# Patient Record
Sex: Male | Born: 1940
Health system: Southern US, Community
[De-identification: ages and names within clinical notes are randomized; demographics above are authoritative.]

## PROBLEM LIST (undated history)

## (undated) DIAGNOSIS — R748 Abnormal levels of other serum enzymes: Secondary | ICD-10-CM

## (undated) DIAGNOSIS — Z720 Tobacco use: Secondary | ICD-10-CM

## (undated) DIAGNOSIS — I10 Essential (primary) hypertension: Secondary | ICD-10-CM

## (undated) DIAGNOSIS — T7840XA Allergy, unspecified, initial encounter: Secondary | ICD-10-CM

## (undated) DIAGNOSIS — E785 Hyperlipidemia, unspecified: Secondary | ICD-10-CM

## (undated) DIAGNOSIS — E119 Type 2 diabetes mellitus without complications: Secondary | ICD-10-CM

## (undated) HISTORY — PX: FOOT SURGERY: SHX648

## (undated) HISTORY — DX: Type 2 diabetes mellitus without complications: E11.9

## (undated) HISTORY — DX: Allergy, unspecified, initial encounter: T78.40XA

## (undated) HISTORY — DX: Tobacco use: Z72.0

## (undated) HISTORY — DX: Hyperlipidemia, unspecified: E78.5

## (undated) HISTORY — DX: Essential (primary) hypertension: I10

---
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
## 2012-01-18 ENCOUNTER — Emergency Department: Payer: Self-pay | Admitting: Emergency Medicine

## 2012-01-18 ENCOUNTER — Ambulatory Visit: Payer: Self-pay | Admitting: Internal Medicine

## 2012-01-18 DIAGNOSIS — R5383 Other fatigue: Secondary | ICD-10-CM | POA: Diagnosis not present

## 2012-01-18 DIAGNOSIS — Z79899 Other long term (current) drug therapy: Secondary | ICD-10-CM | POA: Diagnosis not present

## 2012-01-18 DIAGNOSIS — F17200 Nicotine dependence, unspecified, uncomplicated: Secondary | ICD-10-CM | POA: Diagnosis not present

## 2012-01-18 DIAGNOSIS — R6889 Other general symptoms and signs: Secondary | ICD-10-CM | POA: Diagnosis not present

## 2012-01-18 DIAGNOSIS — I10 Essential (primary) hypertension: Secondary | ICD-10-CM | POA: Diagnosis not present

## 2012-01-18 DIAGNOSIS — R42 Dizziness and giddiness: Secondary | ICD-10-CM | POA: Diagnosis not present

## 2012-01-18 DIAGNOSIS — I6789 Other cerebrovascular disease: Secondary | ICD-10-CM | POA: Diagnosis not present

## 2012-01-18 DIAGNOSIS — R51 Headache: Secondary | ICD-10-CM | POA: Diagnosis not present

## 2012-01-18 DIAGNOSIS — R279 Unspecified lack of coordination: Secondary | ICD-10-CM | POA: Diagnosis not present

## 2012-01-18 DIAGNOSIS — E119 Type 2 diabetes mellitus without complications: Secondary | ICD-10-CM | POA: Diagnosis not present

## 2012-01-18 DIAGNOSIS — I6350 Cerebral infarction due to unspecified occlusion or stenosis of unspecified cerebral artery: Secondary | ICD-10-CM | POA: Diagnosis not present

## 2012-01-18 DIAGNOSIS — R5381 Other malaise: Secondary | ICD-10-CM | POA: Diagnosis not present

## 2012-01-18 LAB — COMPREHENSIVE METABOLIC PANEL
Albumin: 3.9 g/dL (ref 3.4–5.0)
Anion Gap: 10 (ref 7–16)
BUN: 12 mg/dL (ref 7–18)
Bilirubin,Total: 0.5 mg/dL (ref 0.2–1.0)
Chloride: 98 mmol/L (ref 98–107)
Co2: 26 mmol/L (ref 21–32)
Creatinine: 0.81 mg/dL (ref 0.60–1.30)
EGFR (African American): >60
EGFR (Non-African Amer.): >60
Osmolality: 277 (ref 275–301)
Potassium: 3.7 mmol/L (ref 3.5–5.1)
SGOT(AST): 14 U/L — ABNORMAL LOW (ref 15–37)
Sodium: 134 mmol/L — ABNORMAL LOW (ref 136–145)
Total Protein: 7.2 g/dL (ref 6.4–8.2)

## 2012-01-18 LAB — CBC
HGB: 15.2 g/dL (ref 13.0–18.0)
MCH: 31.7 pg (ref 26.0–34.0)
MCV: 90 fL (ref 80–100)
Platelet: 210 10*3/uL (ref 150–440)
WBC: 9 10*3/uL (ref 3.8–10.6)

## 2012-01-18 LAB — TROPONIN I: Troponin-I: <0.02 ng/mL

## 2012-01-21 DIAGNOSIS — I10 Essential (primary) hypertension: Secondary | ICD-10-CM | POA: Diagnosis not present

## 2012-01-21 DIAGNOSIS — I6789 Other cerebrovascular disease: Secondary | ICD-10-CM | POA: Diagnosis not present

## 2012-01-21 DIAGNOSIS — IMO0001 Reserved for inherently not codable concepts without codable children: Secondary | ICD-10-CM | POA: Diagnosis not present

## 2012-01-21 DIAGNOSIS — J449 Chronic obstructive pulmonary disease, unspecified: Secondary | ICD-10-CM | POA: Diagnosis not present

## 2012-02-09 DIAGNOSIS — E119 Type 2 diabetes mellitus without complications: Secondary | ICD-10-CM | POA: Diagnosis not present

## 2012-07-26 DIAGNOSIS — E785 Hyperlipidemia, unspecified: Secondary | ICD-10-CM | POA: Diagnosis not present

## 2012-07-26 DIAGNOSIS — E119 Type 2 diabetes mellitus without complications: Secondary | ICD-10-CM | POA: Diagnosis not present

## 2012-07-26 DIAGNOSIS — I10 Essential (primary) hypertension: Secondary | ICD-10-CM | POA: Diagnosis not present

## 2012-07-26 DIAGNOSIS — F17200 Nicotine dependence, unspecified, uncomplicated: Secondary | ICD-10-CM | POA: Diagnosis not present

## 2013-04-02 DIAGNOSIS — E785 Hyperlipidemia, unspecified: Secondary | ICD-10-CM | POA: Diagnosis not present

## 2013-04-02 DIAGNOSIS — I10 Essential (primary) hypertension: Secondary | ICD-10-CM | POA: Diagnosis not present

## 2013-04-02 DIAGNOSIS — E119 Type 2 diabetes mellitus without complications: Secondary | ICD-10-CM | POA: Diagnosis not present

## 2013-09-17 DIAGNOSIS — E785 Hyperlipidemia, unspecified: Secondary | ICD-10-CM | POA: Diagnosis not present

## 2013-09-17 DIAGNOSIS — IMO0001 Reserved for inherently not codable concepts without codable children: Secondary | ICD-10-CM | POA: Diagnosis not present

## 2013-09-17 DIAGNOSIS — Z125 Encounter for screening for malignant neoplasm of prostate: Secondary | ICD-10-CM | POA: Diagnosis not present

## 2013-09-17 DIAGNOSIS — I10 Essential (primary) hypertension: Secondary | ICD-10-CM | POA: Diagnosis not present

## 2014-03-28 DIAGNOSIS — F17200 Nicotine dependence, unspecified, uncomplicated: Secondary | ICD-10-CM | POA: Diagnosis not present

## 2014-03-28 DIAGNOSIS — I10 Essential (primary) hypertension: Secondary | ICD-10-CM | POA: Diagnosis not present

## 2014-03-28 DIAGNOSIS — J449 Chronic obstructive pulmonary disease, unspecified: Secondary | ICD-10-CM | POA: Diagnosis not present

## 2014-03-28 DIAGNOSIS — E785 Hyperlipidemia, unspecified: Secondary | ICD-10-CM | POA: Diagnosis not present

## 2014-03-28 DIAGNOSIS — IMO0001 Reserved for inherently not codable concepts without codable children: Secondary | ICD-10-CM | POA: Diagnosis not present

## 2014-10-03 DIAGNOSIS — Z125 Encounter for screening for malignant neoplasm of prostate: Secondary | ICD-10-CM | POA: Diagnosis not present

## 2014-10-03 DIAGNOSIS — I10 Essential (primary) hypertension: Secondary | ICD-10-CM | POA: Diagnosis not present

## 2014-10-03 DIAGNOSIS — E785 Hyperlipidemia, unspecified: Secondary | ICD-10-CM | POA: Diagnosis not present

## 2014-10-11 ENCOUNTER — Ambulatory Visit: Payer: Self-pay | Admitting: Family Medicine

## 2014-10-11 DIAGNOSIS — M4184 Other forms of scoliosis, thoracic region: Secondary | ICD-10-CM | POA: Diagnosis not present

## 2014-10-11 DIAGNOSIS — M542 Cervicalgia: Secondary | ICD-10-CM | POA: Diagnosis not present

## 2014-10-11 DIAGNOSIS — M546 Pain in thoracic spine: Secondary | ICD-10-CM | POA: Diagnosis not present

## 2014-10-11 DIAGNOSIS — M47814 Spondylosis without myelopathy or radiculopathy, thoracic region: Secondary | ICD-10-CM | POA: Diagnosis not present

## 2014-10-11 DIAGNOSIS — M47812 Spondylosis without myelopathy or radiculopathy, cervical region: Secondary | ICD-10-CM | POA: Diagnosis not present

## 2015-01-28 ENCOUNTER — Telehealth: Payer: Self-pay | Admitting: Family Medicine

## 2015-01-28 NOTE — Telephone Encounter (Signed)
Please send Amloidpine 5mg  30tabs once daily and metformin 500mg  30 once daily. Please send to [email or web address]

## 2015-02-03 MED ORDER — METFORMIN HCL 500 MG PO TABS: 500.0000 mg | ORAL_TABLET | Freq: Two times a day (BID) | ORAL | Status: DC

## 2015-02-03 MED ORDER — AMLODIPINE BESYLATE 5 MG PO TABS: 5.0000 mg | ORAL_TABLET | Freq: Every day | ORAL | Status: DC

## 2015-02-04 ENCOUNTER — Ambulatory Visit: Payer: Self-pay | Admitting: Family Medicine

## 2015-02-26 ENCOUNTER — Ambulatory Visit: Payer: Self-pay | Admitting: Family Medicine

## 2015-02-28 ENCOUNTER — Other Ambulatory Visit: Payer: Self-pay | Admitting: Family Medicine

## 2015-03-03 ENCOUNTER — Other Ambulatory Visit: Payer: Self-pay

## 2015-03-03 MED ORDER — AMLODIPINE BESYLATE 5 MG PO TABS: 5.0000 mg | ORAL_TABLET | Freq: Every day | ORAL | Status: DC

## 2015-03-03 MED ORDER — METFORMIN HCL 500 MG PO TABS: 500.0000 mg | ORAL_TABLET | Freq: Two times a day (BID) | ORAL | Status: DC

## 2015-03-05 ENCOUNTER — Ambulatory Visit: Payer: Self-pay | Admitting: Family Medicine

## 2015-03-13 ENCOUNTER — Ambulatory Visit: Payer: Self-pay | Admitting: Family Medicine

## 2015-03-18 ENCOUNTER — Ambulatory Visit: Payer: Self-pay | Admitting: Family Medicine

## 2015-03-25 ENCOUNTER — Ambulatory Visit: Payer: Self-pay | Admitting: Family Medicine

## 2015-03-26 ENCOUNTER — Other Ambulatory Visit: Payer: Self-pay | Admitting: Family Medicine

## 2015-03-31 ENCOUNTER — Ambulatory Visit: Payer: Self-pay | Admitting: Family Medicine

## 2015-04-02 ENCOUNTER — Encounter: Payer: Self-pay | Admitting: Family Medicine

## 2015-04-02 ENCOUNTER — Ambulatory Visit (INDEPENDENT_AMBULATORY_CARE_PROVIDER_SITE_OTHER): Payer: Medicare Other | Admitting: Family Medicine

## 2015-04-02 VITALS — BP 120/62 | HR 70 | Temp 98.1°F | Resp 16 | Ht 66.0 in | Wt 178.4 lb

## 2015-04-02 DIAGNOSIS — E785 Hyperlipidemia, unspecified: Secondary | ICD-10-CM | POA: Diagnosis not present

## 2015-04-02 DIAGNOSIS — J410 Simple chronic bronchitis: Secondary | ICD-10-CM | POA: Diagnosis not present

## 2015-04-02 DIAGNOSIS — J431 Panlobular emphysema: Secondary | ICD-10-CM | POA: Insufficient documentation

## 2015-04-02 DIAGNOSIS — Z720 Tobacco use: Secondary | ICD-10-CM

## 2015-04-02 DIAGNOSIS — I10 Essential (primary) hypertension: Secondary | ICD-10-CM | POA: Diagnosis not present

## 2015-04-02 DIAGNOSIS — E1165 Type 2 diabetes mellitus with hyperglycemia: Secondary | ICD-10-CM | POA: Diagnosis not present

## 2015-04-02 DIAGNOSIS — IMO0002 Reserved for concepts with insufficient information to code with codable children: Secondary | ICD-10-CM

## 2015-04-02 LAB — GLUCOSE, POCT (MANUAL RESULT ENTRY): POC GLUCOSE: 227 mg/dL — AB (ref 70–99)

## 2015-04-02 LAB — POCT GLYCOSYLATED HEMOGLOBIN (HGB A1C): HEMOGLOBIN A1C: 8.3

## 2015-04-02 NOTE — Progress Notes (Addendum)
Name: Connor Aguilar   MRN: [id number]    DOB: 05-05-1941   Date:04/02/2015       Progress Note  Subjective  Chief Complaint  Chief Complaint  Patient presents with  . Hypertension  . Hyperlipidemia  . Diabetes    HPI  Diabetes.  Patient is prescribed metformin 500 mg twice a day. No polyuria polydipsia polyphagia currently. He's had diabetes for over 5 years been having a chronic problem with noncompliance with diet and exercise and medication intermittently. He is not on the dietitian as prescribed and start regular checking his glucoses at home.  Hypertension  Patient currently on Avalide 150-2.5 once daily and amlodipine 5 mg daily for chest pain palpitations orthopnea PND or lower extremity swelling.  Hyperlipidemia patient currently on a regimen of atorvastatin 40 mg at bedtime and aspirin once daily. His risk factors include tobacco abuse hyperlipidemia diabetes hypertension sedentary lifestyle and stress.  COPD  Patient continues to smoke one or more packs per day and it done so since his teens. He has nearly daily cough but no significant wheezing or shortness of breath. He has refused pneumococcal vaccine as well as flu vaccine. He is not interested in discontinuation of smoking at all.  Past Medical History  Diagnosis Date  . Diabetes mellitus without complication   . Hypertension   . Hyperlipidemia   . Allergy   . Tobacco abuse     Social History  Substance Use Topics  . Smoking status: Current Every Day Smoker  . Smokeless tobacco: Not on file  . Alcohol Use: No     Current outpatient prescriptions:  .  amLODipine (NORVASC) 5 MG tablet, Take 1 tablet (5 mg total) by mouth daily., Disp: 90 tablet, Rfl: 3 .  cyclobenzaprine (FLEXERIL) 5 MG tablet, TAKE ONE (1) TABLET BY MOUTH TWO (2) TIMES DAILY AS NEEDED FOR MUSCLESPASM, Disp: 60 tablet, Rfl: 1 .  metFORMIN (GLUCOPHAGE) 500 MG tablet, Take 1 tablet (500 mg total) by mouth 2 (two) times daily with a meal.,  Disp: 180 tablet, Rfl: 3  No Known Allergies  Review of Systems  Constitutional: Negative for fever, chills and weight loss.  HENT: Negative for congestion, hearing loss, sore throat and tinnitus.   Eyes: Negative for blurred vision, double vision and redness.  Respiratory: Positive for cough. Negative for hemoptysis and shortness of breath.   Cardiovascular: Negative for chest pain, palpitations, orthopnea, claudication and leg swelling.  Gastrointestinal: Negative for heartburn, nausea, vomiting, diarrhea, constipation and blood in stool.  Genitourinary: Negative for dysuria, urgency, frequency and hematuria.  Musculoskeletal: Positive for back pain and joint pain. Negative for myalgias, falls and neck pain.  Skin: Negative for itching.  Neurological: Negative for dizziness, tingling, tremors, focal weakness, seizures, loss of consciousness, weakness and headaches.  Endo/Heme/Allergies: Does not bruise/bleed easily.  Psychiatric/Behavioral: Negative for depression and substance abuse. The patient is not nervous/anxious and does not have insomnia.      Objective  Filed Vitals:   04/02/15 0955  BP: 120/62  Pulse: 70  Temp: 98.1 F (36.7 C)  Resp: 16  Height: 5\' 6"  (1.676 m)  Weight: 178 lb 7 oz (80.939 kg)  SpO2: 95%     Physical Exam  Constitutional: He is oriented to person, place, and time and well-developed, well-nourished, and in no distress.  HENT:  Head: Normocephalic.  Eyes: EOM are normal. Pupils are equal, round, and reactive to light.  Neck: Normal range of motion. Neck supple. No thyromegaly present.  Cardiovascular: Normal  rate, regular rhythm, normal heart sounds and intact distal pulses.   No murmur heard. Pulmonary/Chest: Effort normal. No respiratory distress. He has no wheezes.  Diminished breath sounds bilaterally with hyperresonance to percussion  Abdominal: Soft. Bowel sounds are normal.  Musculoskeletal: Normal range of motion. He exhibits no edema.   Lymphadenopathy:    He has no cervical adenopathy.  Neurological: He is alert and oriented to person, place, and time. No cranial nerve deficit. Gait normal. Coordination normal.  Skin: Skin is warm and dry. No rash noted.  Psychiatric: Affect and judgment normal.      Assessment & Plan   1. Uncontrolled diabetes mellitus sTILL UNCONTROLLED - POCT HgB A1C - POCT Glucose (CBG) - TSH - Ambulatory referral to Ophthalmology  2. Essential hypertension STABLE - TSH  3. Hyperlipidemia  NEEDS LABS - Comprehensive metabolic panel - Lipid panel - TSH  4. Simple chronic bronchitis UNCHANGED - Spirometry: Pre & Post Eval  5. Tobacco abuse NOT WILLING TO STAY.

## 2015-04-03 ENCOUNTER — Encounter: Payer: Self-pay | Admitting: Family Medicine

## 2015-04-03 DIAGNOSIS — J410 Simple chronic bronchitis: Secondary | ICD-10-CM | POA: Insufficient documentation

## 2015-04-09 DIAGNOSIS — Z720 Tobacco use: Secondary | ICD-10-CM | POA: Insufficient documentation

## 2015-04-30 ENCOUNTER — Other Ambulatory Visit: Payer: Self-pay | Admitting: Family Medicine

## 2015-05-29 ENCOUNTER — Other Ambulatory Visit: Payer: Self-pay | Admitting: Family Medicine

## 2015-07-02 ENCOUNTER — Other Ambulatory Visit: Payer: Self-pay | Admitting: Family Medicine

## 2015-07-27 ENCOUNTER — Emergency Department: Payer: Medicare Other

## 2015-07-27 ENCOUNTER — Encounter: Payer: Self-pay | Admitting: Emergency Medicine

## 2015-07-27 ENCOUNTER — Inpatient Hospital Stay
Admission: EM | Admit: 2015-07-27 | Discharge: 2015-07-30 | DRG: 871 | Disposition: A | Discharge status: Home / Self Care | Payer: Medicare Other | Attending: Internal Medicine | Admitting: Internal Medicine

## 2015-07-27 DIAGNOSIS — Z7984 Long term (current) use of oral hypoglycemic drugs: Secondary | ICD-10-CM

## 2015-07-27 DIAGNOSIS — E119 Type 2 diabetes mellitus without complications: Secondary | ICD-10-CM | POA: Diagnosis present

## 2015-07-27 DIAGNOSIS — J181 Lobar pneumonia, unspecified organism: Secondary | ICD-10-CM | POA: Diagnosis not present

## 2015-07-27 DIAGNOSIS — Z23 Encounter for immunization: Secondary | ICD-10-CM | POA: Diagnosis not present

## 2015-07-27 DIAGNOSIS — Z9181 History of falling: Secondary | ICD-10-CM | POA: Diagnosis not present

## 2015-07-27 DIAGNOSIS — E785 Hyperlipidemia, unspecified: Secondary | ICD-10-CM | POA: Diagnosis present

## 2015-07-27 DIAGNOSIS — M6282 Rhabdomyolysis: Secondary | ICD-10-CM | POA: Diagnosis not present

## 2015-07-27 DIAGNOSIS — I10 Essential (primary) hypertension: Secondary | ICD-10-CM | POA: Diagnosis present

## 2015-07-27 DIAGNOSIS — Z79899 Other long term (current) drug therapy: Secondary | ICD-10-CM | POA: Diagnosis not present

## 2015-07-27 DIAGNOSIS — E876 Hypokalemia: Secondary | ICD-10-CM | POA: Diagnosis present

## 2015-07-27 DIAGNOSIS — J189 Pneumonia, unspecified organism: Secondary | ICD-10-CM | POA: Diagnosis not present

## 2015-07-27 DIAGNOSIS — A419 Sepsis, unspecified organism: Principal | ICD-10-CM | POA: Diagnosis present

## 2015-07-27 DIAGNOSIS — J690 Pneumonitis due to inhalation of food and vomit: Secondary | ICD-10-CM | POA: Diagnosis present

## 2015-07-27 DIAGNOSIS — S0990XA Unspecified injury of head, initial encounter: Secondary | ICD-10-CM | POA: Diagnosis not present

## 2015-07-27 DIAGNOSIS — R413 Other amnesia: Secondary | ICD-10-CM | POA: Diagnosis present

## 2015-07-27 DIAGNOSIS — F17210 Nicotine dependence, cigarettes, uncomplicated: Secondary | ICD-10-CM | POA: Diagnosis not present

## 2015-07-27 DIAGNOSIS — S299XXA Unspecified injury of thorax, initial encounter: Secondary | ICD-10-CM | POA: Diagnosis not present

## 2015-07-27 LAB — URINALYSIS COMPLETE WITH MICROSCOPIC (ARMC ONLY)
BACTERIA UA: NONE SEEN
Bilirubin Urine: NEGATIVE
Glucose, UA: 50 mg/dL — AB
LEUKOCYTES UA: NEGATIVE
NITRITE: NEGATIVE
PH: 5 (ref 5.0–8.0)
PROTEIN: 100 mg/dL — AB
RBC / HPF: NONE SEEN RBC/hpf (ref 0–5)
SPECIFIC GRAVITY, URINE: 1.021 (ref 1.005–1.030)

## 2015-07-27 LAB — CBC WITH DIFFERENTIAL/PLATELET
BASOS PCT: 0 %
Basophils Absolute: 0 10*3/uL (ref 0–0.1)
EOS ABS: 0 10*3/uL (ref 0–0.7)
Eosinophils Relative: 0 %
HCT: 40.8 % (ref 40.0–52.0)
HEMOGLOBIN: 13.6 g/dL (ref 13.0–18.0)
Lymphocytes Relative: 3 %
Lymphs Abs: 0.8 10*3/uL — ABNORMAL LOW (ref 1.0–3.6)
MCH: 30.3 pg (ref 26.0–34.0)
MCHC: 33.4 g/dL (ref 32.0–36.0)
MCV: 90.7 fL (ref 80.0–100.0)
MONO ABS: 1.3 10*3/uL — AB (ref 0.2–1.0)
MONOS PCT: 5 %
NEUTROS PCT: 92 %
Neutro Abs: 26.6 10*3/uL — ABNORMAL HIGH (ref 1.4–6.5)
Platelets: 236 10*3/uL (ref 150–440)
RBC: 4.49 MIL/uL (ref 4.40–5.90)
RDW: 13.5 % (ref 11.5–14.5)
WBC: 28.7 10*3/uL — ABNORMAL HIGH (ref 3.8–10.6)

## 2015-07-27 LAB — COMPREHENSIVE METABOLIC PANEL
ALK PHOS: 46 U/L (ref 38–126)
ALT: 24 U/L (ref 17–63)
AST: 38 U/L (ref 15–41)
Albumin: 3.8 g/dL (ref 3.5–5.0)
Anion gap: 17 — ABNORMAL HIGH (ref 5–15)
BILIRUBIN TOTAL: 1.4 mg/dL — AB (ref 0.3–1.2)
BUN: 21 mg/dL — ABNORMAL HIGH (ref 6–20)
CALCIUM: 8.8 mg/dL — AB (ref 8.9–10.3)
CO2: 22 mmol/L (ref 22–32)
CREATININE: 1.21 mg/dL (ref 0.61–1.24)
Chloride: 95 mmol/L — ABNORMAL LOW (ref 101–111)
GFR calc non Af Amer: 56 mL/min — ABNORMAL LOW (ref >60)
Glucose, Bld: 199 mg/dL — ABNORMAL HIGH (ref 65–99)
Potassium: 3.1 mmol/L — ABNORMAL LOW (ref 3.5–5.1)
SODIUM: 134 mmol/L — AB (ref 135–145)
TOTAL PROTEIN: 7.2 g/dL (ref 6.5–8.1)

## 2015-07-27 LAB — TROPONIN I
TROPONIN I: 0.07 ng/mL — AB (ref <0.031)
TROPONIN I: 0.07 ng/mL — AB (ref <0.031)
Troponin I: 0.07 ng/mL — ABNORMAL HIGH (ref <0.031)

## 2015-07-27 LAB — PROTIME-INR
INR: 1.32
Prothrombin Time: 16.5 seconds — ABNORMAL HIGH (ref 11.4–15.0)

## 2015-07-27 LAB — LACTIC ACID, PLASMA
LACTIC ACID, VENOUS: 3.8 mmol/L — AB (ref 0.5–2.0)
LACTIC ACID, VENOUS: 2.3 mmol/L — AB (ref 0.5–2.0)

## 2015-07-27 LAB — CK: Total CK: 2106 U/L — ABNORMAL HIGH (ref 49–397)

## 2015-07-27 LAB — LIPASE, BLOOD: Lipase: 13 U/L (ref 11–51)

## 2015-07-27 LAB — APTT: aPTT: 29 seconds (ref 24–36)

## 2015-07-27 MED ORDER — SODIUM CHLORIDE 0.9 % IJ SOLN
3.0000 mL | Freq: Two times a day (BID) | INTRAMUSCULAR | Status: DC
  Administered 2015-07-27 – 2015-07-30 (×4): 3 mL via INTRAVENOUS

## 2015-07-27 MED ORDER — CYCLOBENZAPRINE HCL 10 MG PO TABS: 10.0000 mg | ORAL_TABLET | Freq: Three times a day (TID) | ORAL | Status: DC | PRN

## 2015-07-27 MED ORDER — PIPERACILLIN-TAZOBACTAM 3.375 G IVPB 30 MIN
3.3750 g | Freq: Once | INTRAVENOUS | Status: AC
  Administered 2015-07-27: 3.375 g via INTRAVENOUS
  Filled 2015-07-27: qty 50

## 2015-07-27 MED ORDER — ONDANSETRON HCL 4 MG/2ML IJ SOLN: 4.0000 mg | Freq: Four times a day (QID) | INTRAMUSCULAR | Status: DC | PRN

## 2015-07-27 MED ORDER — VANCOMYCIN HCL IN DEXTROSE 1-5 GM/200ML-% IV SOLN
1000.0000 mg | Freq: Once | INTRAVENOUS | Status: AC
  Administered 2015-07-27: 1000 mg via INTRAVENOUS
  Filled 2015-07-27: qty 200

## 2015-07-27 MED ORDER — DEXTROSE 5 % IV SOLN
500.0000 mg | INTRAVENOUS | Status: DC
  Filled 2015-07-27: qty 500

## 2015-07-27 MED ORDER — SODIUM CHLORIDE 0.9 % IV BOLUS (SEPSIS)
2310.0000 mL | Freq: Once | INTRAVENOUS | Status: AC
  Administered 2015-07-27: 2310 mL via INTRAVENOUS

## 2015-07-27 MED ORDER — ENOXAPARIN SODIUM 40 MG/0.4ML ~~LOC~~ SOLN
40.0000 mg | SUBCUTANEOUS | Status: DC
  Administered 2015-07-27 – 2015-07-29 (×2): 40 mg via SUBCUTANEOUS
  Filled 2015-07-27 (×2): qty 0.4

## 2015-07-27 MED ORDER — VANCOMYCIN HCL IN DEXTROSE 1-5 GM/200ML-% IV SOLN: 1000.0000 mg | Freq: Once | INTRAVENOUS | Status: DC

## 2015-07-27 MED ORDER — PIPERACILLIN-TAZOBACTAM 3.375 G IVPB: 3.3750 g | Freq: Three times a day (TID) | INTRAVENOUS | Status: DC

## 2015-07-27 MED ORDER — ACETAMINOPHEN 650 MG RE SUPP: 650.0000 mg | Freq: Four times a day (QID) | RECTAL | Status: DC | PRN

## 2015-07-27 MED ORDER — ONDANSETRON HCL 4 MG PO TABS: 4.0000 mg | ORAL_TABLET | Freq: Four times a day (QID) | ORAL | Status: DC | PRN

## 2015-07-27 MED ORDER — TETANUS-DIPHTH-ACELL PERTUSSIS 5-2.5-18.5 LF-MCG/0.5 IM SUSP
0.5000 mL | Freq: Once | INTRAMUSCULAR | Status: AC
  Administered 2015-07-27: 0.5 mL via INTRAMUSCULAR
  Filled 2015-07-27: qty 0.5

## 2015-07-27 MED ORDER — GUAIFENESIN-DM 100-10 MG/5ML PO SYRP
5.0000 mL | ORAL_SOLUTION | ORAL | Status: DC | PRN
  Administered 2015-07-29: 5 mL via ORAL
  Filled 2015-07-27: qty 5

## 2015-07-27 MED ORDER — POTASSIUM CHLORIDE IN NACL 40-0.9 MEQ/L-% IV SOLN
INTRAVENOUS | Status: DC
  Administered 2015-07-27 – 2015-07-28 (×3): 100 mL/h via INTRAVENOUS
  Administered 2015-07-28 – 2015-07-29 (×2): 125 mL/h via INTRAVENOUS
  Filled 2015-07-27 (×8): qty 1000

## 2015-07-27 MED ORDER — METFORMIN HCL 500 MG PO TABS
500.0000 mg | ORAL_TABLET | Freq: Two times a day (BID) | ORAL | Status: DC
  Administered 2015-07-28 – 2015-07-30 (×5): 500 mg via ORAL
  Filled 2015-07-27 (×5): qty 1

## 2015-07-27 MED ORDER — VANCOMYCIN HCL IN DEXTROSE 750-5 MG/150ML-% IV SOLN
750.0000 mg | Freq: Two times a day (BID) | INTRAVENOUS | Status: DC
  Filled 2015-07-27: qty 150

## 2015-07-27 MED ORDER — GUAIFENESIN ER 600 MG PO TB12
600.0000 mg | ORAL_TABLET | Freq: Two times a day (BID) | ORAL | Status: DC
  Administered 2015-07-27 – 2015-07-30 (×5): 600 mg via ORAL
  Filled 2015-07-27 (×6): qty 1

## 2015-07-27 MED ORDER — ACETAMINOPHEN 325 MG PO TABS
650.0000 mg | ORAL_TABLET | Freq: Four times a day (QID) | ORAL | Status: DC | PRN
  Administered 2015-07-27 – 2015-07-28 (×3): 650 mg via ORAL
  Filled 2015-07-27 (×3): qty 2

## 2015-07-27 MED ORDER — DEXTROSE 5 % IV SOLN
1.0000 g | INTRAVENOUS | Status: DC
  Administered 2015-07-27: 1 g via INTRAVENOUS
  Filled 2015-07-27 (×3): qty 10

## 2015-07-27 MED ORDER — DEXTROSE 5 % IV SOLN
500.0000 mg | Freq: Once | INTRAVENOUS | Status: AC
  Administered 2015-07-27: 500 mg via INTRAVENOUS
  Filled 2015-07-27: qty 500

## 2015-07-27 NOTE — ED Provider Notes (Signed)
[facility] Emergency Department Provider Note  ____________________________________________  Time seen: Approximately 10:40 AM  I have reviewed the triage vital signs and the nursing notes.   HISTORY  Chief Complaint Fall  History of present illness  andreview of systems is limited due to the patient's amnesia to the day's events. Information is obtained from EMS on their arrival as well as family at bedside.  HPI Connor Aguilar is a 74 y.o. male with diabetes, hypertension, hyperlipidemia who presents for altered mental status/confusion and fall. The patient reports that he is not quite sure what happened today. He tells me that a friend came to check on him and he was difficult to awaken. On EMS arrival, he appeared to have fallen. It was unclear how long he been on the ground. He has had cough but denies any recent illness including no vomiting, diarrhea, fevers or chills. No chest pain or difficulty breathing. No pain or burning with urination.   Past Medical History  Diagnosis Date  . Diabetes mellitus without complication ([place])   . Hypertension   . Hyperlipidemia   . Allergy   . Tobacco abuse     Patient Active Problem List   Diagnosis Date Noted  . Pneumonia 07/27/2015  . Tobacco abuse 04/09/2015  . Simple chronic bronchitis ([place]) 04/03/2015  . Uncontrolled diabetes mellitus ([place]) 04/02/2015  . Hyperlipidemia 04/02/2015  . Essential hypertension 04/02/2015  . Panlobular emphysema ([place]) 04/02/2015    Past Surgical History  Procedure Laterality Date  . Foot surgery      No current outpatient prescriptions on file.  Allergies Review of patient's allergies indicates no known allergies.  Family History  Problem Relation Age of Onset  . Diabetes Mother     Social History Social History  Substance Use Topics  . Smoking status: Current Every Day Smoker -- 2.00 packs/day    Types: Cigarettes  . Smokeless tobacco: None  . Alcohol Use:  No    Review of Systems Constitutional: No fever/chills Cardiovascular: Denies chest pain. Respiratory: Denies shortness of breath. Musculoskeletal: Negative for back pain. Skin: Negative for rash.   History of present illness  andreview of systems is limited due to the patient's amnesia to the day's events. Information is obtained from EMS on their arrival as well as family at bedside.  ____________________________________________   PHYSICAL EXAM:  VITAL SIGNS: ED Triage Vitals  Enc Vitals Group     BP 07/27/15 0948 123/68 mmHg     Pulse Rate 07/27/15 0948 100     Resp 07/27/15 0948 23     Temp 07/27/15 0948 98.8 F (37.1 C)     Temp Source 07/27/15 0948 Oral     SpO2 07/27/15 0948 92 %     Weight 07/27/15 0948 170 lb (77.111 kg)     Height 07/27/15 0948 5\' 8"  (1.727 m)     Head Cir --      Peak Flow --      Pain Score --      Pain Loc --      Pain Edu? --      Excl. in [place]? --     Constitutional: Alert and oriented x 3. Nontoxic appearing and in no acute distress. Eyes: Conjunctivae are normal. PERRL. EOMI. Head: Atraumatic. Nose: No congestion/rhinnorhea. Mouth/Throat: Mucous membranes are dry.  Oropharynx non-erythematous. Neck: No stridor.  Cardiovascular: mildly tachycardic rate, regular rhythm. Grossly normal heart sounds.  Good peripheral circulation. Respiratory: Mild tachypnea. Normal respiratory effort.  No  retractions. Lungs CTAB. Gastrointestinal: Soft and nontender. No distention.  No CVA tenderness. Genitourinary: deferred Musculoskeletal: No lower extremity tenderness nor edema.  Bruising with small hemostatic abrasions associated with the left elbow but the patient has full range of motion at the left elbow, 2+ left radial pulse. Neurologic:  Normal speech and language. No gross focal neurologic deficits are appreciated.  Skin:  Skin is warm, dry and intact. No rash noted. Psychiatric: Mood and affect are normal. Speech and behavior are  normal.  ____________________________________________   LABS (all labs ordered are listed, but only abnormal results are displayed)  Labs Reviewed  COMPREHENSIVE METABOLIC PANEL - Abnormal; Notable for the following:    Sodium 134 (*)    Potassium 3.1 (*)    Chloride 95 (*)    Glucose, Bld 199 (*)    BUN 21 (*)    Calcium 8.8 (*)    Total Bilirubin 1.4 (*)    GFR calc non Af Amer 56 (*)    Anion gap 17 (*)    All other components within normal limits  CBC WITH DIFFERENTIAL/PLATELET - Abnormal; Notable for the following:    WBC 28.7 (*)    Neutro Abs 26.6 (*)    Lymphs Abs 0.8 (*)    Monocytes Absolute 1.3 (*)    All other components within normal limits  LACTIC ACID, PLASMA - Abnormal; Notable for the following:    Lactic Acid, Venous 3.8 (*)    All other components within normal limits  LACTIC ACID, PLASMA - Abnormal; Notable for the following:    Lactic Acid, Venous 2.3 (*)    All other components within normal limits  TROPONIN I - Abnormal; Notable for the following:    Troponin I 0.07 (*)    All other components within normal limits  PROTIME-INR - Abnormal; Notable for the following:    Prothrombin Time 16.5 (*)    All other components within normal limits  URINALYSIS COMPLETEWITH MICROSCOPIC ([facility] ONLY) - Abnormal; Notable for the following:    Color, Urine YELLOW (*)    APPearance HAZY (*)    Glucose, UA 50 (*)    Ketones, ur TRACE (*)    Hgb urine dipstick 3+ (*)    Protein, ur 100 (*)    Squamous Epithelial / LPF 0-5 (*)    All other components within normal limits  CK - Abnormal; Notable for the following:    Total CK 2106 (*)    All other components within normal limits  CULTURE, BLOOD (ROUTINE X 2)  CULTURE, BLOOD (ROUTINE X 2)  URINE CULTURE  LIPASE, BLOOD  APTT  TROPONIN I  TROPONIN I   ____________________________________________  EKG  ED ECG REPORT I, Joanne Gavel, the attending physician, personally viewed and interpreted this ECG.    Date: 07/27/2015  EKG Time: 09:43  Rate: 100  Rhythm: sinus tachycardia, frequent PVCs.  Axis: normal  Intervals:none  ST&T Change: No acute ST elevation.  ____________________________________________  RADIOLOGY  CXR IMPRESSION: Dense confluent airspace opacity within the inferior segment of the right upper lobe, most likely representing pneumonia (especially if febrile), alternatively aspiration.   CT head  IMPRESSION: No acute intracranial pathology.  ____________________________________________   PROCEDURES  Procedure(s) performed: None  Critical Care performed: Yes, see critical care note(s). Total critical care time spent 35 minutes.  ____________________________________________   INITIAL IMPRESSION / ASSESSMENT AND PLAN / ED COURSE  Pertinent labs & imaging results that were available during my care of the patient  were reviewed by me and considered in my medical decision making (see chart for details).  Connor Aguilar is a 74 y.o. male with diabetes, hypertension, hyperlipidemia who presents for altered mental status/confusion and fall. On exam, he does not appear to be in any acute distress but is mildly tachycardic and mildly to Meeting 2 out of 4 Sirs criteria. His exam is otherwise only notable for abrasions with ecchymosis of the left elbow which is not tender, there is no bony deformity. Upon seeing him, I initiated code sepsis. Currently awaiting labs, chest x-ray, urinalysis. Will give liberal IV fluids, give Vancomycin and Zosyn and anticipate admission.  ----------------------------------------- 12:52 PM on 07/27/2015 ----------------------------------------- Chest x-ray concerning for pneumonia. Suspect pneumonia sepsis, will add azithromycin for atypical coverage. Labs reviewed and are notable for leukocytosis with white blood cell count 28,000. His lactic acid elevated at 3.8. Urinalysis is not consistent with urinary tract infection. Continue  treatment as above. Troponin elevated at 0.07, suspect demand ischemia. Discussed with the hospitalist, Dr. Posey Pronto, for admission.  ____________________________________________   FINAL CLINICAL IMPRESSION(S) / ED DIAGNOSES  Final diagnoses:  Community acquired pneumonia  Sepsis, due to unspecified organism ([place])      Joanne Gavel, MD 07/27/15 [phone]

## 2015-07-27 NOTE — ED Notes (Signed)
Pt to ED via EMS c/o fall from standing this morning, pt states he lost his balance and was unable to get up. Denies LOC, denies head involvement, denies pain at this time. No use of blood thinners.

## 2015-07-27 NOTE — Progress Notes (Signed)
ANTIBIOTIC CONSULT NOTE - INITIAL  Pharmacy Consult for piperacillin/tazobactam & vancomycin Indication: rule out sepsis  No Known Allergies  Patient Measurements: Height: 5\' 8"  (172.7 cm) Weight: 170 lb (77.111 kg) IBW/kg (Calculated) : 68.4 Adjusted Body Weight: n/a  Vital Signs: Temp: 98.8 F (37.1 C) (12/18 0948) Temp Source: Oral (12/18 0948) BP: 130/73 mmHg (12/18 1100) Pulse Rate: 100 (12/18 1100)  Labs:  Recent Labs  07/27/15 0951  WBC 28.7*  HGB 13.6  PLT 236  CREATININE 1.21   Estimated Creatinine Clearance: 50.2 mL/min (by C-G formula based on Cr of 1.21). No results for input(s): VANCOTROUGH, VANCOPEAK, VANCORANDOM, GENTTROUGH, GENTPEAK, GENTRANDOM, TOBRATROUGH, TOBRAPEAK, TOBRARND, AMIKACINPEAK, AMIKACINTROU, AMIKACIN in the last 72 hours.   Microbiology: No results found for this or any previous visit (from the past 720 hour(s)).  Medications:  Anti-infectives    Start     Dose/Rate Route Frequency Ordered Stop   07/27/15 2100  piperacillin-tazobactam (ZOSYN) IVPB 3.375 g     3.375 g 12.5 mL/hr over 240 Minutes Intravenous 3 times per day 07/27/15 1123     07/27/15 1800  vancomycin (VANCOCIN) IVPB 750 mg/150 ml premix     750 mg 150 mL/hr over 60 Minutes Intravenous Every 12 hours 07/27/15 1123     07/27/15 1200  vancomycin (VANCOCIN) IVPB 1000 mg/200 mL premix     1,000 mg 200 mL/hr over 60 Minutes Intravenous  Once 07/27/15 1119     07/27/15 1045  piperacillin-tazobactam (ZOSYN) IVPB 3.375 g     3.375 g 100 mL/hr over 30 Minutes Intravenous  Once 07/27/15 1041     07/27/15 1045  vancomycin (VANCOCIN) IVPB 1000 mg/200 mL premix  Status:  Discontinued     1,000 mg 200 mL/hr over 60 Minutes Intravenous  Once 07/27/15 1041 07/27/15 1048     Assessment: Pharmacy consulted to dose vancomycin and piperacillin/tazobactam in this 74 year old male being admitted for sepsis.   Use actual body weight of 77.1 kg  SCr: 1.2 CrCl: ~50  mL/min  Kinetics: Ke: 0.046 Half-life: ~15 hours Vd: 53 L Cmin: 15.6 mcg/mL  Goal of Therapy:  Vancomycin trough level 15-20 mcg/ml  Plan:  Measure antibiotic drug levels at steady state Follow up culture results  Start piperacillin/tazobactam 3.375 g IV q 8 hours EI  Give vancomycin 1 g IV x 1 dose in the ED Follow with vancomycin 750 mg IV q12 hours (stacked dose to begin 6 hours after initial dose, at 1800 tonight) Vancomycin trough ordered for 12/20 at 0530, which is prior to 5th dose and should represent steady state. Patient is not obese, so do not anticipate accumulation.  Pharmacy will continue to monitor, thank you for the consult.  Darylene Price Aribella Vavra 07/27/2015,11:24 AM

## 2015-07-27 NOTE — H&P (Signed)
Connor Aguilar at [place] NAME: Connor Aguilar    MR#:  [id number]  DATE OF BIRTH:  12/19/1939  DATE OF ADMISSION:  07/27/2015  PRIMARY CARE PHYSICIAN: Ashok Norris, MD   REQUESTING/REFERRING PHYSICIAN: Leonides Cave, MD  CHIEF COMPLAINT:   Chief Complaint  Patient presents with  . Fall    HISTORY OF PRESENT ILLNESS: Camaron Abdala  is a 74 y.o. male with a known history of  diabetes, hypertension, hyperlipidemia, nicotine abuse who brought in by his friend. She reports that she was at his house yesterday and he looked kind of pale and sweating profusely. But did not complain of anything yesterday. When she went to check on him today. The chain on his door was closed. When he would not open the door therefore EMS was called. When they arrived they noticed him on the floor. Patient had fell to the ground and could not get up. He was on the floor for a few hours. Patient was brought to the emergency room and was noted to have pneumonia, elevated lactic acid level, hypokalemia, and leukocytosis. He is very weak currently and unable to provide me detailed history. His friend at the bedside is the one that is answering all the questions.  PAST MEDICAL HISTORY:   Past Medical History  Diagnosis Date  . Diabetes mellitus without complication ([place])   . Hypertension   . Hyperlipidemia   . Allergy   . Tobacco abuse     PAST SURGICAL HISTORY:  Past Surgical History  Procedure Laterality Date  . Foot surgery      SOCIAL HISTORY:  Social History  Substance Use Topics  . Smoking status: Current Every Day Smoker -- 2.00 packs/day    Types: Cigarettes  . Smokeless tobacco: Not on file  . Alcohol Use: No    FAMILY HISTORY:  Family History  Problem Relation Age of Onset  . Diabetes Mother     DRUG ALLERGIES: No Known Allergies  REVIEW OF SYSTEMS:   CONSTITUTIONAL: Positive fatigue or weakness. Unclear if he had fevers Limited due to  patient being very sleepy   MEDICATIONS AT HOME:  Prior to Admission medications   Medication Sig Start Date End Date Taking? Authorizing Provider  amLODipine (NORVASC) 5 MG tablet TAKE ONE (1) TABLET EACH DAY 05/29/15  Yes Ashok Norris, MD  atorvastatin (LIPITOR) 40 MG tablet TAKE ONE TABLET BY MOUTH EVERY NIGHT AT BEDTIME 05/29/15  Yes Ashok Norris, MD  cyclobenzaprine (FLEXERIL) 5 MG tablet TAKE ONE TABLET TWICE DAILY AS NEEDED FOR MUSCLE SPASM 07/02/15  Yes Ashok Norris, MD  irbesartan-hydrochlorothiazide (AVALIDE) 150-12.5 MG tablet TAKE TWO TABLETS BY MOUTH DAILY 05/29/15  Yes Ashok Norris, MD  metFORMIN (GLUCOPHAGE) 500 MG tablet TAKE ONE TABLET TWICE DAILY WITH A MEAL 05/29/15  Yes Ashok Norris, MD      PHYSICAL EXAMINATION:   VITAL SIGNS: Blood pressure 152/75, pulse 104, temperature 98.8 F (37.1 C), temperature source Oral, resp. rate 21, height 5\' 8"  (1.727 m), weight 77.111 kg (170 lb), SpO2 92 %.  GENERAL:  74 y.o.-year-old patient lying in the bed with no acute distress.  EYES: Pupils equal, round, reactive to light and accommodation. No scleral icterus. Extraocular muscles intact.  HEENT: Head atraumatic, normocephalic. Oropharynx and nasopharynx clear.  NECK:  Supple, no jugular venous distention. No thyroid enlargement, no tenderness.  LUNGS: Diminished breath sounds in the right upper lobe, without any wheezing or rhonchior muscle usage  CARDIOVASCULAR: S1, S2  normal. No murmurs, rubs, or gallops.  ABDOMEN: Soft, nontender, nondistended. Bowel sounds present. No organomegaly or mass.  EXTREMITIES: No pedal edema, cyanosis, or clubbing.  NEUROLOGIC: Cranial nerves II through XII are intact. Muscle strength 5/5 in all extremities. Sensation intact. Gait not checked.  PSYCHIATRIC: The patient is alert and oriented x 3.  SKIN: No obvious rash, lesion, or ulcer.   LABORATORY PANEL:   CBC  Recent Labs Lab 07/27/15 0951  WBC 28.7*  HGB 13.6  HCT  40.8  PLT 236  MCV 90.7  MCH 30.3  MCHC 33.4  RDW 13.5  LYMPHSABS 0.8*  MONOABS 1.3*  EOSABS 0.0  BASOSABS 0.0   ------------------------------------------------------------------------------------------------------------------  Chemistries   Recent Labs Lab 07/27/15 0951  NA 134*  K 3.1*  CL 95*  CO2 22  GLUCOSE 199*  BUN 21*  CREATININE 1.21  CALCIUM 8.8*  AST 38  ALT 24  ALKPHOS 46  BILITOT 1.4*   ------------------------------------------------------------------------------------------------------------------ estimated creatinine clearance is 49.5 mL/min (by C-G formula based on Cr of 1.21). ------------------------------------------------------------------------------------------------------------------ No results for input(s): TSH, T4TOTAL, T3FREE, THYROIDAB in the last 72 hours.  Invalid input(s): FREET3   Coagulation profile  Recent Labs Lab 07/27/15 0951  INR 1.32   ------------------------------------------------------------------------------------------------------------------- No results for input(s): DDIMER in the last 72 hours. -------------------------------------------------------------------------------------------------------------------  Cardiac Enzymes  Recent Labs Lab 07/27/15 0951  TROPONINI 0.07*   ------------------------------------------------------------------------------------------------------------------ Invalid input(s): POCBNP  ---------------------------------------------------------------------------------------------------------------  Urinalysis    Component Value Date/Time   COLORURINE YELLOW* 07/27/2015 1250   APPEARANCEUR HAZY* 07/27/2015 1250   LABSPEC 1.021 07/27/2015 1250   PHURINE 5.0 07/27/2015 1250   GLUCOSEU 50* 07/27/2015 1250   HGBUR 3+* 07/27/2015 1250   BILIRUBINUR NEGATIVE 07/27/2015 1250   KETONESUR TRACE* 07/27/2015 1250   PROTEINUR 100* 07/27/2015 1250   NITRITE NEGATIVE 07/27/2015 1250    LEUKOCYTESUR NEGATIVE 07/27/2015 1250     RADIOLOGY: Dg Chest 2 View  07/27/2015  CLINICAL DATA:  Fall from standing this morning. EXAM: CHEST  2 VIEW COMPARISON:  None. FINDINGS: Confluent dense airspace opacity is present within the inferior segment of the right upper lobe, most consistent with pneumonia. Lungs otherwise clear. No pleural effusions seen. No pneumothorax seen. Heart size is normal. Mild atherosclerotic changes noted at the aortic arch. Osseous and soft tissue structures about the chest are unremarkable. No rib fracture seen. IMPRESSION: Dense confluent airspace opacity within the inferior segment of the right upper lobe, most likely representing pneumonia (especially if febrile), alternatively aspiration. Electronically Signed   By: Franki Cabot M.D.   On: 07/27/2015 12:44   Ct Head Wo Contrast  07/27/2015  CLINICAL DATA:  Fall EXAM: CT HEAD WITHOUT CONTRAST TECHNIQUE: Contiguous axial images were obtained from the base of the skull through the vertex without intravenous contrast. COMPARISON:  01/18/2012 FINDINGS: Global atrophy. Chronic ischemic changes in the periventricular white matter. There is no mass effect, midline shift, or acute intracranial hemorrhage. Mastoid air cells are clear. Cranium is intact. IMPRESSION: No acute intracranial pathology. Electronically Signed   By: Marybelle Killings M.D.   On: 07/27/2015 12:38    EKG: Orders placed or performed during the hospital encounter of 07/27/15  . EKG 12-Lead  . EKG 12-Lead  . ED EKG 12-Lead  . ED EKG 12-Lead    IMPRESSION AND PLAN: Patient is a 74 year old white male with hypertension, hyperlipidemia and nicotine abuse presenting with fall and generalized weakness  1. Sepsis based on patient's leukocytosis, elevated lactic acid level and intermittent tachycardia: We will  treat with IV Rocephin and azithromycin.  2. Diabetes type 2: Continue metformin and his creatinine is 1.21 if it increases then would stop that I  will place him on sliding scale insulin  3. Rhabdomyolysis: Due to patient falling we'll repeat CPK give him IV fluids  4. Hypokalemia replace his potassium  5. Hyperlipidemia hold atorvastatin due to elevated CPK  6. Hypertension we'll hold his amlodipine, irbesartan- and hydrochlorothiazide for now.  7. Misc: lovenox for dvt proph   All the records are reviewed and case discussed with ED provider. Management plans discussed with the patient, family and they are in agreement.  CODE STATUS: Advance Directive Documentation        Most Recent Value   Type of Advance Directive  Living will   Pre-existing out of facility DNR order (yellow form or pink MOST form)     "MOST" Form in Place?         TOTAL TIME TAKING CARE OF THIS PATIENT: 11minutes.    Dustin Flock M.D on 07/27/2015 at 2:04 PM  Between 7am to 6pm - Pager - [phone]  After 6pm go to [email or web address] - password [email or web address] [facility]  Office  [phone]  CC: Primary care physician; Ashok Norris, MD

## 2015-07-28 LAB — BLOOD CULTURE ID PANEL (REFLEXED)
ACINETOBACTER BAUMANNII: NOT DETECTED
CANDIDA ALBICANS: NOT DETECTED
CANDIDA GLABRATA: NOT DETECTED
CANDIDA KRUSEI: NOT DETECTED
CANDIDA PARAPSILOSIS: NOT DETECTED
CANDIDA TROPICALIS: NOT DETECTED
CARBAPENEM RESISTANCE: NOT DETECTED
ENTEROBACTER CLOACAE COMPLEX: NOT DETECTED
ENTEROBACTERIACEAE SPECIES: NOT DETECTED
ESCHERICHIA COLI: NOT DETECTED
Enterococcus species: NOT DETECTED
Haemophilus influenzae: NOT DETECTED
KLEBSIELLA OXYTOCA: NOT DETECTED
KLEBSIELLA PNEUMONIAE: NOT DETECTED
Listeria monocytogenes: NOT DETECTED
Methicillin resistance: NOT DETECTED
NEISSERIA MENINGITIDIS: NOT DETECTED
PROTEUS SPECIES: NOT DETECTED
Pseudomonas aeruginosa: NOT DETECTED
STREPTOCOCCUS AGALACTIAE: NOT DETECTED
STREPTOCOCCUS PNEUMONIAE: DETECTED — AB
STREPTOCOCCUS PYOGENES: NOT DETECTED
Serratia marcescens: NOT DETECTED
Staphylococcus aureus (BCID): NOT DETECTED
Staphylococcus species: NOT DETECTED
Streptococcus species: NOT DETECTED
Vancomycin resistance: NOT DETECTED

## 2015-07-28 LAB — BASIC METABOLIC PANEL
Anion gap: 8 (ref 5–15)
BUN: 14 mg/dL (ref 6–20)
CHLORIDE: 102 mmol/L (ref 101–111)
CO2: 24 mmol/L (ref 22–32)
CREATININE: 0.82 mg/dL (ref 0.61–1.24)
Calcium: 7.7 mg/dL — ABNORMAL LOW (ref 8.9–10.3)
GFR calc Af Amer: >60 mL/min (ref >60)
GFR calc non Af Amer: >60 mL/min (ref >60)
GLUCOSE: 139 mg/dL — AB (ref 65–99)
POTASSIUM: 3.3 mmol/L — AB (ref 3.5–5.1)
SODIUM: 134 mmol/L — AB (ref 135–145)

## 2015-07-28 LAB — CBC
HCT: 35.9 % — ABNORMAL LOW (ref 40.0–52.0)
HEMOGLOBIN: 12.1 g/dL — AB (ref 13.0–18.0)
MCH: 30.7 pg (ref 26.0–34.0)
MCHC: 33.7 g/dL (ref 32.0–36.0)
MCV: 91.1 fL (ref 80.0–100.0)
Platelets: 175 10*3/uL (ref 150–440)
RBC: 3.95 MIL/uL — ABNORMAL LOW (ref 4.40–5.90)
RDW: 13.6 % (ref 11.5–14.5)
WBC: 16.3 10*3/uL — ABNORMAL HIGH (ref 3.8–10.6)

## 2015-07-28 LAB — TROPONIN I: Troponin I: 0.09 ng/mL — ABNORMAL HIGH (ref <0.031)

## 2015-07-28 LAB — GLUCOSE, CAPILLARY: GLUCOSE-CAPILLARY: 120 mg/dL — AB (ref 65–99)

## 2015-07-28 LAB — CK: CK TOTAL: 2258 U/L — AB (ref 49–397)

## 2015-07-28 MED ORDER — DEXTROSE 5 % IV SOLN
500.0000 mg | INTRAVENOUS | Status: DC
  Administered 2015-07-28 – 2015-07-29 (×2): 500 mg via INTRAVENOUS
  Filled 2015-07-28 (×3): qty 500

## 2015-07-28 MED ORDER — CEFTRIAXONE SODIUM 2 G IJ SOLR
2.0000 g | INTRAMUSCULAR | Status: DC
  Administered 2015-07-28: 2 g via INTRAVENOUS
  Filled 2015-07-28 (×2): qty 2

## 2015-07-28 MED ORDER — INSULIN ASPART 100 UNIT/ML ~~LOC~~ SOLN: 0.0000 [IU] | Freq: Every day | SUBCUTANEOUS | Status: DC

## 2015-07-28 MED ORDER — INSULIN ASPART 100 UNIT/ML ~~LOC~~ SOLN
0.0000 [IU] | Freq: Three times a day (TID) | SUBCUTANEOUS | Status: DC
  Administered 2015-07-29: 1 [IU] via SUBCUTANEOUS
  Administered 2015-07-29: 2 [IU] via SUBCUTANEOUS
  Administered 2015-07-30: 1 [IU] via SUBCUTANEOUS
  Filled 2015-07-28: qty 2
  Filled 2015-07-28 (×2): qty 1

## 2015-07-28 NOTE — Progress Notes (Signed)
[place] [facility] [place] NAME: Connor Aguilar    MR#:  [id number]  DATE OF BIRTH:  12/19/1939  SUBJECTIVE:  CHIEF COMPLAINT:  Patient's shortness of breath is slightly better than compared to yesterday. Reporting productive cough Other complaints  REVIEW OF SYSTEMS:  CONSTITUTIONAL: No fever, fatigue or weakness.  EYES: No blurred or double vision.  EARS, NOSE, AND THROAT: No tinnitus or ear pain.  RESPIRATORY: Reporting productive cough, exertional shortness of breath, denies wheezing or hemoptysis.  CARDIOVASCULAR: No chest pain, orthopnea, edema.  GASTROINTESTINAL: No nausea, vomiting, diarrhea or abdominal pain.  GENITOURINARY: No dysuria, hematuria.  ENDOCRINE: No polyuria, nocturia,  HEMATOLOGY: No anemia, easy bruising or bleeding SKIN: No rash or lesion. MUSCULOSKELETAL: No joint pain or arthritis.   NEUROLOGIC: No tingling, numbness, weakness.  PSYCHIATRY: No anxiety or depression.   DRUG ALLERGIES:  No Known Allergies  VITALS:  Blood pressure 117/57, pulse 80, temperature 98.5 F (36.9 C), temperature source Oral, resp. rate 22, height 5\' 8"  (1.727 m), weight 77.248 kg (170 lb 4.8 oz), SpO2 93 %.  PHYSICAL EXAMINATION:  GENERAL:  74 y.o.-year-old patient lying in the bed with no acute distress.  EYES: Pupils equal, round, reactive to light and accommodation. No scleral icterus. Extraocular muscles intact.  HEENT: Head atraumatic, normocephalic. Oropharynx and nasopharynx clear.  NECK:  Supple, no jugular venous distention. No thyroid enlargement, no tenderness.  LUNGS: Diminished breath sounds bilaterally, positive crackles right upper lobe, no wheezing, rales,rhonchi or crepitation. No use of accessory muscles of respiration.  CARDIOVASCULAR: S1, S2 normal. No murmurs, rubs, or gallops.  ABDOMEN: Soft, nontender, nondistended. Bowel sounds present. No organomegaly or mass.  EXTREMITIES: No pedal edema, cyanosis, or  clubbing.  NEUROLOGIC: Cranial nerves II through XII are intact. Muscle strength 5/5 in all extremities. Sensation intact. Gait not checked.  PSYCHIATRIC: The patient is alert and oriented x 3.  SKIN: No obvious rash, lesion, or ulcer.    LABORATORY PANEL:   CBC  Recent Labs Lab 07/28/15 0608  WBC 16.3*  HGB 12.1*  HCT 35.9*  PLT 175   ------------------------------------------------------------------------------------------------------------------  Chemistries   Recent Labs Lab 07/27/15 0951 07/28/15 0608  NA 134* 134*  K 3.1* 3.3*  CL 95* 102  CO2 22 24  GLUCOSE 199* 139*  BUN 21* 14  CREATININE 1.21 0.82  CALCIUM 8.8* 7.7*  AST 38  --   ALT 24  --   ALKPHOS 46  --   BILITOT 1.4*  --    ------------------------------------------------------------------------------------------------------------------  Cardiac Enzymes  Recent Labs Lab 07/28/15 0608  TROPONINI 0.09*   ------------------------------------------------------------------------------------------------------------------  RADIOLOGY:  Dg Chest 2 View  07/27/2015  CLINICAL DATA:  Fall from standing this morning. EXAM: CHEST  2 VIEW COMPARISON:  None. FINDINGS: Confluent dense airspace opacity is present within the inferior segment of the right upper lobe, most consistent with pneumonia. Lungs otherwise clear. No pleural effusions seen. No pneumothorax seen. Heart size is normal. Mild atherosclerotic changes noted at the aortic arch. Osseous and soft tissue structures about the chest are unremarkable. No rib fracture seen. IMPRESSION: Dense confluent airspace opacity within the inferior segment of the right upper lobe, most likely representing pneumonia (especially if febrile), alternatively aspiration. Electronically Signed   By: Franki Cabot M.D.   On: 07/27/2015 12:44   Ct Head Wo Contrast  07/27/2015  CLINICAL DATA:  Fall EXAM: CT HEAD WITHOUT CONTRAST TECHNIQUE: Contiguous axial images were  obtained from the base of the  skull through the vertex without intravenous contrast. COMPARISON:  01/18/2012 FINDINGS: Global atrophy. Chronic ischemic changes in the periventricular white matter. There is no mass effect, midline shift, or acute intracranial hemorrhage. Mastoid air cells are clear. Cranium is intact. IMPRESSION: No acute intracranial pathology. Electronically Signed   By: Marybelle Killings M.D.   On: 07/27/2015 12:38    EKG:   Orders placed or performed during the hospital encounter of 07/27/15  . EKG 12-Lead  . EKG 12-Lead  . ED EKG 12-Lead  . ED EKG 12-Lead    ASSESSMENT AND PLAN:   Patient is a 74 year old white male with hypertension, hyperlipidemia and nicotine abuse presenting with fall and generalized weakness  1. Sepsis secondary to right upper lobe pneumonia -based on patient's leukocytosis, elevated lactic acid level and intermittent tachycardia:  We will treat with IV Rocephin and azithromycin. Follow-up urine culture and blood cultures, sputum cultures are ordered   2. Diabetes type 2:  Continue metformin and his creatinine is 1.21 if it increases then would stop it  on sliding scale insulin  3. Rhabdomyolysis: IV fluids increased to 125 ML per hour  Due to patient falling we'll repeat CPK in a.m.   4. Hypokalemia replace his potassium  5. Hyperlipidemia hold atorvastatin due to elevated CPK  6. Hypertension we'll hold his amlodipine, irbesartan- and hydrochlorothiazide for now.  7. Misc: lovenox for dvt proph   consult physical therapy for deconditioning in view of fall      All the records are reviewed and case discussed with Care Management/Social Workerr. Management plans discussed with the patient, family and they are in agreement.  CODE STATUS: fc  TOTAL TIME TAKING CARE OF THIS PATIENT: 13minutes.   POSSIBLE D/C IN 2-3  DAYS, DEPENDING ON CLINICAL CONDITION.   Nicholes Mango M.D on 07/28/2015 at 5:17 PM  Between 7am to 6pm - Pager -  [phone] After 6pm go to [email or web address] - password [email or web address] [facility]  Office  [phone]  CC: Primary care physician; Ashok Norris, MD

## 2015-07-28 NOTE — Progress Notes (Signed)
Patient alert and oriented, vss.  No complaints of pain.  On room air.  Family at bedside.

## 2015-07-28 NOTE — Care Management (Signed)
Patient presents from home.  Prior to this admission, patient independent in all adls, denies  problems accessing medical care or obtaining  medications.  Drives.  Does not have chronic home 02.  spoke with primary nurse about ambulating patient to determine if there is need for physical therapy consult.  Also discussion during progression of need to assess exertional room air sats due to admitting dx of pneumonia.  Very supportive family

## 2015-07-28 NOTE — Progress Notes (Signed)
Inpatient Diabetes Program Recommendations  AACE/ADA: New Consensus Statement on Inpatient Glycemic Control (2015)  Target Ranges:  Prepandial:   less than 140 mg/dL      Peak postprandial:   less than 180 mg/dL (1-2 hours)      Critically ill patients:  140 - 180 mg/dL   Review of Glycemic Control:  Results for [name], [name] (MRN [id number]) as of 07/28/2015 11:19  Ref. Range 07/27/2015 09:51 07/28/2015 06:08  Glucose Latest Ref Range: 65-99 mg/dL 199 (H) 139 (H)   Diabetes history: Diabetes mellitus Outpatient Diabetes medications: Metformin 500 mg bid Current orders for Inpatient glycemic control:  Metformin 500 mg bid Inpatient Diabetes Program Recommendations:    Please consider adding Novolog sensitive tid with meals and HS while patient is in the hospital.    Thanks, Connor Perl, RN, BC-ADM Inpatient Diabetes Coordinator Pager [phone] (8a-5p)

## 2015-07-28 NOTE — Progress Notes (Signed)
Pharmacy antibiotic follow up  !2/19 0350 [facility] returned S. pneumoniae from blood specimen. Pt already on ceftriaxone. Changed dose to 2 grams q 24 housr per conversation with hospitalist.

## 2015-07-29 LAB — GLUCOSE, CAPILLARY
GLUCOSE-CAPILLARY: 125 mg/dL — AB (ref 65–99)
GLUCOSE-CAPILLARY: 130 mg/dL — AB (ref 65–99)
GLUCOSE-CAPILLARY: 175 mg/dL — AB (ref 65–99)
Glucose-Capillary: 126 mg/dL — ABNORMAL HIGH (ref 65–99)

## 2015-07-29 LAB — BASIC METABOLIC PANEL
Anion gap: 8 (ref 5–15)
BUN: 12 mg/dL (ref 6–20)
CALCIUM: 8 mg/dL — AB (ref 8.9–10.3)
CHLORIDE: 106 mmol/L (ref 101–111)
CO2: 22 mmol/L (ref 22–32)
CREATININE: 0.63 mg/dL (ref 0.61–1.24)
GFR calc non Af Amer: >60 mL/min (ref >60)
GLUCOSE: 136 mg/dL — AB (ref 65–99)
Potassium: 4.3 mmol/L (ref 3.5–5.1)
Sodium: 136 mmol/L (ref 135–145)

## 2015-07-29 LAB — CBC
HCT: 37.8 % — ABNORMAL LOW (ref 40.0–52.0)
Hemoglobin: 12.9 g/dL — ABNORMAL LOW (ref 13.0–18.0)
MCH: 31.3 pg (ref 26.0–34.0)
MCHC: 34 g/dL (ref 32.0–36.0)
MCV: 92 fL (ref 80.0–100.0)
Platelets: 190 10*3/uL (ref 150–440)
RBC: 4.11 MIL/uL — ABNORMAL LOW (ref 4.40–5.90)
RDW: 13.7 % (ref 11.5–14.5)
WBC: 11 10*3/uL — AB (ref 3.8–10.6)

## 2015-07-29 LAB — URINE CULTURE: Culture: NO GROWTH

## 2015-07-29 LAB — CK: Total CK: 944 U/L — ABNORMAL HIGH (ref 49–397)

## 2015-07-29 LAB — HEMOGLOBIN A1C: HEMOGLOBIN A1C: 6.6 % — AB (ref 4.0–6.0)

## 2015-07-29 MED ORDER — SODIUM CHLORIDE 0.9 % IV SOLN
INTRAVENOUS | Status: AC
  Administered 2015-07-29 (×2): via INTRAVENOUS

## 2015-07-29 NOTE — Progress Notes (Signed)
Patient/family refused bed alarm. Encouraged patient to use alarm. Patient educated on safety protocol.

## 2015-07-29 NOTE — Progress Notes (Signed)
Physical Therapy Evaluation Patient Details Name: Connor Aguilar MRN: [id number] DOB: 12/19/1939 Today's Date: 07/29/2015   History of Present Illness  Jemuel Glessner is a 74 y.o. male with a known history of diabetes, hypertension, hyperlipidemia, nicotine abuse who brought in by his friend.  Pt with sepsis from pneumonia.  Clinical Impression  Pt presents near baseline functioning with all bed mobility, transfers, and ambulation; requiring extra time only to complete tasks.  Pt feels a little weak and has R rib pain from cough/pneumonia, and is able to get up out of bed and ambulate with modified independence.  Pt had no difficulty breathing or supplemental oxygen requirements during PT session this date.  Recommend pt return to restricted activity until well and normal home routine.    Follow Up Recommendations No PT follow up    Equipment Recommendations  None recommended by PT    Recommendations for Other Services       Precautions / Restrictions Precautions Precautions: Fall Restrictions Weight Bearing Restrictions: No      Mobility  Bed Mobility Overal bed mobility: Modified Independent             General bed mobility comments: extra time to get up into sitting due to rib pain  Transfers Overall transfer level: Independent Equipment used: None                Ambulation/Gait Ambulation/Gait assistance: Modified independent (Device/Increase time) Ambulation Distance (Feet): 400 Feet Assistive device: None Gait Pattern/deviations: WFL(Within Functional Limits)     General Gait Details: Decreased cadence but otherwise WFL's.  Stairs            Wheelchair Mobility    Modified Rankin (Stroke Patients Only)       Balance Overall balance assessment: Modified Independent                                           Pertinent Vitals/Pain Pain Assessment: 0-10 Pain Location: R ribs Pain Descriptors / Indicators:  Aching;Discomfort Pain Intervention(s): Limited activity within patient's tolerance    Home Living Family/patient expects to be discharged to:: Private residence Living Arrangements: Alone   Type of Home: House Home Access: Stairs to enter Entrance Stairs-Rails: Right Entrance Stairs-Number of Steps: 3 Home Layout: One level Home Equipment: None      Prior Function Level of Independence: Independent         Comments: Pt I with all ADL's, drives, and 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     Hand Dominance        Extremity/Trunk Assessment   Upper Extremity Assessment: Overall WFL for tasks assessed           Lower Extremity Assessment: Overall WFL for tasks assessed         Communication   Communication: No difficulties  Cognition Arousal/Alertness: Awake/alert Behavior During Therapy: WFL for tasks assessed/performed Overall Cognitive Status: Within Functional Limits for tasks assessed                      General Comments General comments (skin integrity, edema, etc.): visible areas c/d/i    Exercises        Assessment/Plan    PT Assessment Patent does not need any further PT services  PT Diagnosis Difficulty walking   PT Problem List    PT Treatment Interventions  PT Goals (Current goals can be found in the Care Plan section) Acute Rehab PT Goals Patient Stated Goal: To go home for Christmas PT Goal Formulation: With patient Time For Goal Achievement: 08/12/15 Potential to Achieve Goals: Good    Frequency     Barriers to discharge        Co-evaluation               End of Session                 Time: [id number] PT Time Calculation (min) (ACUTE ONLY): 30 min   Charges:   PT Evaluation $Initial PT Evaluation Tier I: 1 Procedure     PT G Codes:        Bernard Slayden A Salvator Seppala 2015/08/13, 12:53 PM

## 2015-07-29 NOTE — Plan of Care (Signed)
Problem: Safety: Goal: Ability to remain free from injury will improve Outcome: Progressing Patient/family are refusing the bed alarm

## 2015-07-29 NOTE — Progress Notes (Signed)
[place] [facility] [place] NAME: Connor Aguilar    MR#:  [id number]  DATE OF BIRTH:  12/19/1939  SUBJECTIVE:  CHIEF COMPLAINT:  Patient's shortness of breath is better than compared to yesterday. No overnight events, no complaints Denies muscle cramps REVIEW OF SYSTEMS:  CONSTITUTIONAL: No fever, fatigue or weakness.  EYES: No blurred or double vision.  EARS, NOSE, AND THROAT: No tinnitus or ear pain.  RESPIRATORY: Reporting improved productive cough, , denies shortness of breath, wheezing or hemoptysis.  CARDIOVASCULAR: No chest pain, orthopnea, edema.  GASTROINTESTINAL: No nausea, vomiting, diarrhea or abdominal pain.  GENITOURINARY: No dysuria, hematuria.  ENDOCRINE: No polyuria, nocturia,  HEMATOLOGY: No anemia, easy bruising or bleeding SKIN: No rash or lesion. MUSCULOSKELETAL: No joint pain or arthritis.   NEUROLOGIC: No tingling, numbness, weakness.  PSYCHIATRY: No anxiety or depression.   DRUG ALLERGIES:  No Known Allergies  VITALS:  Blood pressure 131/68, pulse 80, temperature 97.4 F (36.3 C), temperature source Oral, resp. rate 22, height 5\' 8"  (1.727 m), weight 77.248 kg (170 lb 4.8 oz), SpO2 95 %.  PHYSICAL EXAMINATION:  GENERAL:  74 y.o.-year-old patient lying in the bed with no acute distress.  EYES: Pupils equal, round, reactive to light and accommodation. No scleral icterus. Extraocular muscles intact.  HEENT: Head atraumatic, normocephalic. Oropharynx and nasopharynx clear.  NECK:  Supple, no jugular venous distention. No thyroid enlargement, no tenderness.  LUNGS: Diminished breath sounds bilaterally, positive crackles right upper lobe, no wheezing, rales,rhonchi or crepitation. No use of accessory muscles of respiration.  CARDIOVASCULAR: S1, S2 normal. No murmurs, rubs, or gallops.  ABDOMEN: Soft, nontender, nondistended. Bowel sounds present. No organomegaly or mass.  EXTREMITIES: No pedal edema, cyanosis, or  clubbing.  NEUROLOGIC: Cranial nerves II through XII are intact. Muscle strength 5/5 in all extremities. Sensation intact. Gait not checked.  PSYCHIATRIC: The patient is alert and oriented x 3.  SKIN: No obvious rash, lesion, or ulcer.    LABORATORY PANEL:   CBC  Recent Labs Lab 07/29/15 0732  WBC 11.0*  HGB 12.9*  HCT 37.8*  PLT 190   ------------------------------------------------------------------------------------------------------------------  Chemistries   Recent Labs Lab 07/27/15 0951  07/29/15 0732  NA 134*  < > 136  K 3.1*  < > 4.3  CL 95*  < > 106  CO2 22  < > 22  GLUCOSE 199*  < > 136*  BUN 21*  < > 12  CREATININE 1.21  < > 0.63  CALCIUM 8.8*  < > 8.0*  AST 38  --   --   ALT 24  --   --   ALKPHOS 46  --   --   BILITOT 1.4*  --   --   < > = values in this interval not displayed. ------------------------------------------------------------------------------------------------------------------  Cardiac Enzymes  Recent Labs Lab 07/28/15 0608  TROPONINI 0.09*   ------------------------------------------------------------------------------------------------------------------  RADIOLOGY:  No results found.  EKG:   Orders placed or performed during the hospital encounter of 07/27/15  . EKG 12-Lead  . EKG 12-Lead  . ED EKG 12-Lead  . ED EKG 12-Lead    ASSESSMENT AND PLAN:   Patient is a 74 year old white male with hypertension, hyperlipidemia and nicotine abuse presenting with fall and generalized weakness  1. Sepsis secondary to right upper lobe pneumonia -based on patient's leukocytosis, elevated lactic acid level and intermittent tachycardia:  Patient was treated with with IV Rocephin and azithromycin until today. One blood culture bottle has revealed  Streptococcus pneumonia and the other bottle is negative. Discontinue Rocephin and continue azithromycin Follow-up urine culture are negative   2. Diabetes type 2:  Continue metformin and his  creatinine is 1.21--> 0.6 today if it increases then would stop it  on sliding scale insulin  3. Rhabdomyolysis: CK -2100- 999 IV fluids at 125 ML per hour  Due to patient falling, we'll repeat CPK in a.m.   4. Hypokalemia replace his potassium and potassium is at 4.3 today Change IV fluids with potassium to  normal saline  5. Hyperlipidemia hold atorvastatin due to elevated CPK  6. Hypertension we'll hold his amlodipine, irbesartan- and hydrochlorothiazide for now.  7. Misc: lovenox for dvt proph   consult physical therapy for deconditioning in view of fall -recommended no PT needs     All the records are reviewed and case discussed with Care Management/Social Workerr. Management plans discussed with the patient, family and they are in agreement.  CODE STATUS: fc  TOTAL TIME TAKING CARE OF THIS PATIENT: 13minutes.   POSSIBLE D/C IN am  DAYS, DEPENDING ON CLINICAL CONDITION.   Nicholes Mango M.D on 07/29/2015 at 2:53 PM  Between 7am to 6pm - Pager - [phone] After 6pm go to [email or web address] - password [email or web address] [facility]  Office  [phone]  CC: Primary care physician; Ashok Norris, MD

## 2015-07-29 NOTE — Care Management (Signed)
No physical therapy needs at discharge. Anticipate discahrge within 24 hours if all remains stable

## 2015-07-30 LAB — GLUCOSE, CAPILLARY: GLUCOSE-CAPILLARY: 131 mg/dL — AB (ref 65–99)

## 2015-07-30 LAB — CK: Total CK: 541 U/L — ABNORMAL HIGH (ref 49–397)

## 2015-07-30 MED ORDER — GUAIFENESIN-DM 100-10 MG/5ML PO SYRP: 5.0000 mL | ORAL_SOLUTION | ORAL | Status: DC | PRN

## 2015-07-30 MED ORDER — ACETAMINOPHEN 325 MG PO TABS: 650.0000 mg | ORAL_TABLET | Freq: Four times a day (QID) | ORAL | Status: DC | PRN

## 2015-07-30 MED ORDER — AZITHROMYCIN 250 MG PO TABS: ORAL_TABLET | ORAL | Status: AC

## 2015-07-30 NOTE — Progress Notes (Signed)
Glucometer failing to send CBGs to [facility]. Pt. cbg 135 this AM per NT.

## 2015-07-30 NOTE — Progress Notes (Signed)
Pt. Discharged to home via wc. Discharge instructions and medication regimen reviewed at bedside with patient. Pt. verbalizes understanding of instructions and medication regimen. Prescriptions included with discharge papers. Patient assessment unchanged from this morning. TELE and IV discontinued per policy.   

## 2015-07-30 NOTE — Progress Notes (Signed)
Patient has rested quietly tonight. No complaints of pain and no signs of discomfort or distress noted. Nursing staff will continue to monitor. Hymie Gorr A Alie Moudy, RN 

## 2015-07-30 NOTE — Discharge Instructions (Signed)
Activity as tolerated  Diet -low salt and ada

## 2015-08-01 LAB — CULTURE, BLOOD (ROUTINE X 2): CULTURE: NO GROWTH

## 2015-08-01 NOTE — Discharge Summary (Signed)
[facility] at [place] NAME: Connor Aguilar    MR#:  [id number]  DATE OF BIRTH:  12/19/1939  DATE OF ADMISSION:  07/27/2015 ADMITTING PHYSICIAN: Dustin Flock, MD  DATE OF DISCHARGE: 07/30/2015 12:25 PM  PRIMARY CARE PHYSICIAN: Ashok Norris, MD    ADMISSION DIAGNOSIS:  Community acquired pneumonia [J18.9] Sepsis, due to unspecified organism ([place]) [A41.9]  DISCHARGE DIAGNOSIS:  Active Problems:   Pneumonia  rhabdomyolysis  SECONDARY DIAGNOSIS:   Past Medical History  Diagnosis Date  . Diabetes mellitus without complication ([place])   . Hypertension   . Hyperlipidemia   . Allergy   . Tobacco abuse     HOSPITAL COURSE:  Patient is a 74 year old white male with hypertension, hyperlipidemia and nicotine abuse presenting with fall and generalized weakness  1. Sepsis secondary to right upper lobe pneumonia -based on patient's leukocytosis, elevated lactic acid level and intermittent tachycardia: Patient was treated with with IV Rocephin and azithromycin during the hospital course. One blood culture bottle has revealed Streptococcus pneumonia and the other bottle is negative, probably a contaminant specimen. Discontinue Rocephin and continue azithromycin Follow-up urine culture are negative   2. Diabetes type 2:  Continue metformin and his creatinine is 1.21--> 0.6 today   3. Rhabdomyolysis:secondary to fall- CK -2100- 999-->500 Improved with IV fluids   4. Hypokalemia replace his potassium and potassium is at 4.3  repleted  5. Hyperlipidemia hold atorvastatin due to elevated CPK  6. Hypertension -resumed his amlodipine, irbesartan- and hydrochlorothiazide for now.  7. Misc: lovenox for dvt proph-given during the hospital course  consult physical therapy for deconditioning in view of fall -recommended no PT needs     DISCHARGE CONDITIONS:   fair  CONSULTS OBTAINED:  Treatment Team:  Dustin Flock,  MD   PROCEDURES none  DRUG ALLERGIES:  No Known Allergies  DISCHARGE MEDICATIONS:   Discharge Medication List as of 07/30/2015 11:19 AM    START taking these medications   Details  acetaminophen (TYLENOL) 325 MG tablet Take 2 tablets (650 mg total) by mouth every 6 (six) hours as needed for mild pain (or Fever >/= 101)., Starting 07/30/2015, Until Discontinued, OTC    azithromycin (ZITHROMAX) 250 MG tablet Once daily, Print    guaiFENesin-dextromethorphan (ROBITUSSIN DM) 100-10 MG/5ML syrup Take 5 mLs by mouth every 4 (four) hours as needed for cough., Starting 07/30/2015, Until Discontinued, Normal      CONTINUE these medications which have NOT CHANGED   Details  amLODipine (NORVASC) 5 MG tablet TAKE ONE (1) TABLET EACH DAY, Normal    atorvastatin (LIPITOR) 40 MG tablet TAKE ONE TABLET BY MOUTH EVERY NIGHT AT BEDTIME, Normal    cyclobenzaprine (FLEXERIL) 5 MG tablet TAKE ONE TABLET TWICE DAILY AS NEEDED FOR MUSCLE SPASM, Normal    irbesartan-hydrochlorothiazide (AVALIDE) 150-12.5 MG tablet TAKE TWO TABLETS BY MOUTH DAILY, Normal    metFORMIN (GLUCOPHAGE) 500 MG tablet TAKE ONE TABLET TWICE DAILY WITH A MEAL, Normal         DISCHARGE INSTRUCTIONS:   Activity as tolerated Follow-up with primary care physician in a week Outpatient follow-up with diabetic clinic in a week  DIET:  Diabetic diet  DISCHARGE CONDITION:  Fair  ACTIVITY:  Activity as tolerated  OXYGEN:  Home Oxygen: No.   Oxygen Delivery: room air  DISCHARGE LOCATION:  home   If you experience worsening of your admission symptoms, develop shortness of breath, life threatening emergency, suicidal or homicidal thoughts you must seek medical attention  immediately by calling 911 or calling your MD immediately  if symptoms less severe.  You Must read complete instructions/literature along with all the possible adverse reactions/side effects for all the Medicines you take and that have been prescribed  to you. Take any new Medicines after you have completely understood and accpet all the possible adverse reactions/side effects.   Please note  You were cared for by a hospitalist during your hospital stay. If you have any questions about your discharge medications or the care you received while you were in the hospital after you are discharged, you can call the unit and asked to speak with the hospitalist on call if the hospitalist that took care of you is not available. Once you are discharged, your primary care physician will handle any further medical issues. Please note that NO REFILLS for any discharge medications will be authorized once you are discharged, as it is imperative that you return to your primary care physician (or establish a relationship with a primary care physician if you do not have one) for your aftercare needs so that they can reassess your need for medications and monitor your lab values.     Today  Chief Complaint  Patient presents with  . Fall   Patient is feeling fine. Denies any shortness of breath or body cramps. All dressed up to go home as he is feeling better  ROS: CONSTITUTIONAL: Denies fevers, chills. Denies any fatigue, weakness.  EYES: Denies blurry vision, double vision, eye pain. EARS, NOSE, THROAT: Denies tinnitus, ear pain, hearing loss. RESPIRATORY: Denies cough, wheeze, shortness of breath.  CARDIOVASCULAR: Denies chest pain, palpitations, edema.  GASTROINTESTINAL: Denies nausea, vomiting, diarrhea, abdominal pain. Denies bright red blood per rectum. GENITOURINARY: Denies dysuria, hematuria. ENDOCRINE: Denies nocturia or thyroid problems. HEMATOLOGIC AND LYMPHATIC: Denies easy bruising or bleeding. SKIN: Denies rash or lesion. MUSCULOSKELETAL: Denies pain in neck, back, shoulder, knees, hips or arthritic symptoms.  NEUROLOGIC: Denies paralysis, paresthesias.  PSYCHIATRIC: Denies anxiety or depressive symptoms.   VITAL SIGNS:  Blood pressure  145/71, pulse 71, temperature 97.5 F (36.4 C), temperature source Oral, resp. rate 22, height 5\' 8"  (1.727 m), weight 77.248 kg (170 lb 4.8 oz), SpO2 95 %.  I/O:  No intake or output data in the 24 hours ending 08/01/15 1247  PHYSICAL EXAMINATION:  GENERAL:  74 y.o.-year-old patient lying in the bed with no acute distress.  EYES: Pupils equal, round, reactive to light and accommodation. No scleral icterus. Extraocular muscles intact.  HEENT: Head atraumatic, normocephalic. Oropharynx and nasopharynx clear.  NECK:  Supple, no jugular venous distention. No thyroid enlargement, no tenderness.  LUNGS: Normal breath sounds bilaterally, no wheezing, rales,rhonchi or crepitation. No use of accessory muscles of respiration.  CARDIOVASCULAR: S1, S2 normal. No murmurs, rubs, or gallops.  ABDOMEN: Soft, non-tender, non-distended. Bowel sounds present. No organomegaly or mass.  EXTREMITIES: No pedal edema, cyanosis, or clubbing.  NEUROLOGIC: Cranial nerves II through XII are intact. Muscle strength 5/5 in all extremities. Sensation intact. Gait not checked.  PSYCHIATRIC: The patient is alert and oriented x 3.  SKIN: No obvious rash, lesion, or ulcer.   DATA REVIEW:   CBC  Recent Labs Lab 07/29/15 0732  WBC 11.0*  HGB 12.9*  HCT 37.8*  PLT 190    Chemistries   Recent Labs Lab 07/27/15 0951  07/29/15 0732  NA 134*  < > 136  K 3.1*  < > 4.3  CL 95*  < > 106  CO2 22  < >  22  GLUCOSE 199*  < > 136*  BUN 21*  < > 12  CREATININE 1.21  < > 0.63  CALCIUM 8.8*  < > 8.0*  AST 38  --   --   ALT 24  --   --   ALKPHOS 46  --   --   BILITOT 1.4*  --   --   < > = values in this interval not displayed.  Cardiac Enzymes  Recent Labs Lab 07/28/15 0608  TROPONINI 0.09*    Microbiology Results  Results for orders placed or performed during the hospital encounter of 07/27/15  Blood Culture (routine x 2)     Status: None   Collection Time: 07/27/15 11:12 AM  Result Value Ref Range  Status   Specimen Description BLOOD LEFT AC  Final   Special Requests BOTTLES DRAWN AEROBIC AND ANAEROBIC  3CC  Final   Culture  Setup Time   Final    GRAM POSITIVE COCCI AEROBIC BOTTLE ONLY CRITICAL RESULT CALLED TO, READ BACK BY AND VERIFIED WITH: MATT MCBANE AT [id number] ON 07/28/15 [facility] CONFIRMED BY [facility]    Culture STREPTOCOCCUS PNEUMONIAE AEROBIC BOTTLE ONLY   Final   Report Status 08/01/2015 FINAL  Final   Organism ID, Bacteria STREPTOCOCCUS PNEUMONIAE  Final      Susceptibility   Streptococcus pneumoniae - MIC*    ERYTHROMYCIN <=0.12 SENSITIVE Sensitive     VANCOMYCIN 0.5 SENSITIVE Sensitive     TRIMETH/SULFA <=10 SENSITIVE Sensitive     CLINDAMYCIN <=0.25 SENSITIVE Sensitive     * STREPTOCOCCUS PNEUMONIAE  Blood Culture (routine x 2)     Status: None   Collection Time: 07/27/15 11:12 AM  Result Value Ref Range Status   Specimen Description BLOOD RIGHT AC  Final   Special Requests   Final    BOTTLES DRAWN AEROBIC AND ANAEROBIC  AER 3CC ANA [place]   Culture NO GROWTH 5 DAYS  Final   Report Status 08/01/2015 FINAL  Final  Blood Culture ID Panel (Reflexed)     Status: Abnormal   Collection Time: 07/27/15 11:12 AM  Result Value Ref Range Status   Enterococcus species NOT DETECTED NOT DETECTED Final   Listeria monocytogenes NOT DETECTED NOT DETECTED Final   Staphylococcus species NOT DETECTED NOT DETECTED Final   Staphylococcus aureus NOT DETECTED NOT DETECTED Final   Streptococcus species NOT DETECTED NOT DETECTED Final   Streptococcus agalactiae NOT DETECTED NOT DETECTED Final   Streptococcus pneumoniae DETECTED (A) NOT DETECTED Final    Comment: CRITICAL CALLED TO AND READ BACK FROM MATT MCBANE AT [id number] ON 07/28/15 RWW   Streptococcus pyogenes NOT DETECTED NOT DETECTED Final   Acinetobacter baumannii NOT DETECTED NOT DETECTED Final   Enterobacteriaceae species NOT DETECTED NOT DETECTED Final   Enterobacter cloacae complex NOT DETECTED NOT DETECTED Final   Escherichia coli NOT  DETECTED NOT DETECTED Final   Klebsiella oxytoca NOT DETECTED NOT DETECTED Final   Klebsiella pneumoniae NOT DETECTED NOT DETECTED Final   Proteus species NOT DETECTED NOT DETECTED Final   Serratia marcescens NOT DETECTED NOT DETECTED Final   Haemophilus influenzae NOT DETECTED NOT DETECTED Final   Neisseria meningitidis NOT DETECTED NOT DETECTED Final   Pseudomonas aeruginosa NOT DETECTED NOT DETECTED Final   Candida albicans NOT DETECTED NOT DETECTED Final   Candida glabrata NOT DETECTED NOT DETECTED Final   Candida krusei NOT DETECTED NOT DETECTED Final   Candida parapsilosis NOT DETECTED NOT DETECTED Final   Candida tropicalis NOT DETECTED NOT  DETECTED Final   Carbapenem resistance NOT DETECTED NOT DETECTED Final   Methicillin resistance NOT DETECTED NOT DETECTED Final   Vancomycin resistance NOT DETECTED NOT DETECTED Final  Urine culture     Status: None   Collection Time: 07/27/15 12:50 PM  Result Value Ref Range Status   Specimen Description URINE, RANDOM  Final   Special Requests NONE  Final   Culture NO GROWTH 2 DAYS  Final   Report Status 07/29/2015 FINAL  Final    RADIOLOGY:  No results found.  EKG:   Orders placed or performed during the hospital encounter of 07/27/15  . EKG 12-Lead  . EKG 12-Lead  . ED EKG 12-Lead  . ED EKG 12-Lead      Management plans discussed with the patient, family and they are in agreement.  CODE STATUS:  Advance Directive Documentation        Most Recent Value   Type of Advance Directive  Living will   Pre-existing out of facility DNR order (yellow form or pink MOST form)     "MOST" Form in Place?        TOTAL TIME TAKING CARE OF THIS PATIENT: 45  minutes.    @[facility] @  on 08/01/2015 at 12:47 PM  Between 7am to 6pm - Pager - [phone]  After 6pm go to [email or web address] - password [email or web address] [facility]  Office  [phone]  CC: Primary care physician; Ashok Norris, MD

## 2015-08-05 ENCOUNTER — Encounter: Payer: Self-pay | Admitting: Family Medicine

## 2015-08-05 ENCOUNTER — Ambulatory Visit (INDEPENDENT_AMBULATORY_CARE_PROVIDER_SITE_OTHER): Payer: Medicare Other | Admitting: Family Medicine

## 2015-08-05 VITALS — BP 118/70 | HR 77 | Temp 98.3°F | Resp 18 | Ht 68.0 in | Wt 163.5 lb

## 2015-08-05 DIAGNOSIS — E1121 Type 2 diabetes mellitus with diabetic nephropathy: Secondary | ICD-10-CM

## 2015-08-05 DIAGNOSIS — T796XXA Traumatic ischemia of muscle, initial encounter: Secondary | ICD-10-CM | POA: Diagnosis not present

## 2015-08-05 DIAGNOSIS — E119 Type 2 diabetes mellitus without complications: Secondary | ICD-10-CM | POA: Insufficient documentation

## 2015-08-05 DIAGNOSIS — E1165 Type 2 diabetes mellitus with hyperglycemia: Secondary | ICD-10-CM | POA: Insufficient documentation

## 2015-08-05 DIAGNOSIS — I10 Essential (primary) hypertension: Secondary | ICD-10-CM | POA: Diagnosis not present

## 2015-08-05 DIAGNOSIS — J441 Chronic obstructive pulmonary disease with (acute) exacerbation: Secondary | ICD-10-CM

## 2015-08-05 DIAGNOSIS — R6510 Systemic inflammatory response syndrome (SIRS) of non-infectious origin without acute organ dysfunction: Secondary | ICD-10-CM

## 2015-08-05 DIAGNOSIS — E785 Hyperlipidemia, unspecified: Secondary | ICD-10-CM | POA: Diagnosis not present

## 2015-08-05 MED ORDER — MUPIROCIN 2 % EX OINT: 1.0000 "application " | TOPICAL_OINTMENT | Freq: Two times a day (BID) | CUTANEOUS | Status: DC

## 2015-08-05 MED ORDER — TIOTROPIUM BROMIDE MONOHYDRATE 18 MCG IN CAPS: 18.0000 ug | ORAL_CAPSULE | Freq: Once | RESPIRATORY_TRACT | Status: DC

## 2015-08-05 MED ORDER — ATORVASTATIN CALCIUM 40 MG PO TABS: 40.0000 mg | ORAL_TABLET | Freq: Every day | ORAL | Status: DC

## 2015-08-05 NOTE — Progress Notes (Signed)
Name: Connor Aguilar   MRN: [id number]    DOB: 12/19/1939   Date:08/05/2015       Progress Note  Subjective  Chief Complaint  Chief Complaint  Patient presents with  . Hypertension    4 month follow up  . Diabetes  . Hyperlipidemia  . Pneumonia    hospital follow up  . Rash    on mouth after hospitalization    HPI  SIRS  Patient recently hospitalized for an episode of SIRS secondary to acute exacerbation of COPD and concomitant bronchitis and pneumonia. In addition he suffered an episode of rhabdomyolysis and that with his disease and being alone at home he became delirious week and felt floor was found by a friend before being brought into the hospital. After rehydration and fluids and treatment with IV Rocephin and Zithromax and he has recuperated considerably. He was given oxygen and nebulizers around-the-clock initially and had a quick response to that. He states that now he has finished the antibiotics and is no longer coughing or short of breath. His discharge date from the hospital was less than 2 weeks ag  Diabetes  Patient presents for follow-up of diabetes which is present for over 5 years. Is currently on a regimen of metformin 500 mg daily . Patient states noncompliant with their diet and exercise. There's been no hypoglycemic episodes and there is no polyuria polydipsia polyphagia. His average fasting glucoses been in the low around - with a high around - . There is no end organ disease.  Last diabetic eye exam was many years.   Last visit with dietitian was over. Last microalbumin was obtained 50 on today .   Hypertension   Patient presents for follow-up of hypertension. It has been present for over over 5 years.  Patient states that there is compliance with medical regimen which consists of Avalide 150-12.5 daily . There is no end organ disease. Cardiac risk factors include hypertension hyperlipidemia and diabetes.  Exercise regimen consist of nothing .  Diet consist of  noncompliance .  Hyperlipidemia  Patient has a history of hyperlipidemia for over 5 years.  Current medical regimen consist of atorvastatin in the past this been held secondary to rhabdomyolysis .  Compliance is none taken currently .  Diet and exercise are currently followed as prescribed .  Risk factors for cardiovascular disease include hyperlipidemia diabetes and hypertension and his advanced age and smoking .   There have been no side effects from the medication.     Social History  Substance Use Topics  . Smoking status: Current Every Day Smoker -- 2.00 packs/day    Types: Cigarettes  . Smokeless tobacco: Not on file  . Alcohol Use: No     Current outpatient prescriptions:  .  acetaminophen (TYLENOL) 325 MG tablet, Take 2 tablets (650 mg total) by mouth every 6 (six) hours as needed for mild pain (or Fever >/= 101)., Disp: , Rfl:  .  amLODipine (NORVASC) 5 MG tablet, TAKE ONE (1) TABLET EACH DAY, Disp: 90 tablet, Rfl: 0 .  atorvastatin (LIPITOR) 40 MG tablet, Take 1 tablet (40 mg total) by mouth at bedtime., Disp: 30 tablet, Rfl: 2 .  cyclobenzaprine (FLEXERIL) 5 MG tablet, TAKE ONE TABLET TWICE DAILY AS NEEDED FOR MUSCLE SPASM, Disp: 60 tablet, Rfl: 0 .  guaiFENesin-dextromethorphan (ROBITUSSIN DM) 100-10 MG/5ML syrup, Take 5 mLs by mouth every 4 (four) hours as needed for cough., Disp: 118 mL, Rfl: 0 .  irbesartan-hydrochlorothiazide (AVALIDE) 150-12.5 MG tablet,  TAKE TWO TABLETS BY MOUTH DAILY, Disp: 60 tablet, Rfl: 2 .  metFORMIN (GLUCOPHAGE) 500 MG tablet, TAKE ONE TABLET TWICE DAILY WITH A MEAL, Disp: 180 tablet, Rfl: 2 .  mupirocin ointment (BACTROBAN) 2 %, Place 1 application into the nose 2 (two) times daily. 2 times to lips for 10 days, Disp: 22 g, Rfl: 0 .  tiotropium (SPIRIVA HANDIHALER) 18 MCG inhalation capsule, Place 1 capsule (18 mcg total) into inhaler and inhale once., Disp: 30 capsule, Rfl: 12  No Known Allergies  Review of Systems  Constitutional: Positive for  malaise/fatigue. Negative for fever, chills and weight loss.  HENT: Positive for congestion. Negative for hearing loss, sore throat and tinnitus.   Eyes: Negative for blurred vision, double vision and redness.  Respiratory: Positive for cough and sputum production. Negative for hemoptysis, shortness of breath and wheezing.   Cardiovascular: Negative for chest pain, palpitations, orthopnea, claudication and leg swelling.  Gastrointestinal: Negative for heartburn, nausea, vomiting, diarrhea, constipation and blood in stool.  Genitourinary: Negative for dysuria, urgency, frequency and hematuria.  Musculoskeletal: Negative for myalgias, back pain, joint pain, falls and neck pain.  Skin: Negative for itching.  Neurological: Negative for dizziness, tingling, tremors, focal weakness, seizures, loss of consciousness, weakness and headaches.  Endo/Heme/Allergies: Does not bruise/bleed easily.  Psychiatric/Behavioral: Negative for depression and substance abuse. The patient is not nervous/anxious and does not have insomnia.      Objective  Filed Vitals:   08/05/15 1202  BP: 118/70  Pulse: 77  Temp: 98.3 F (36.8 C)  TempSrc: Oral  Resp: 18  Height: 5\' 8"  (1.727 m)  Weight: 163 lb 8 oz (74.163 kg)  SpO2: 96%     Physical Exam  Constitutional: He is oriented to person, place, and time.  Elderly and chronically ill-appearing  HENT:  Head: Normocephalic.  Eyes: EOM are normal. Pupils are equal, round, and reactive to light.  Neck: Normal range of motion. Neck supple. No thyromegaly present.  Cardiovascular: Normal rate, regular rhythm and normal heart sounds.   No murmur heard. Pulmonary/Chest: No respiratory distress. He has no wheezes.  Diminished breath sounds throughout with hyperresonance to percussion  Abdominal: Soft. Bowel sounds are normal.  Musculoskeletal: Normal range of motion. He exhibits no edema.  Lymphadenopathy:    He has no cervical adenopathy.  Neurological: He is  alert and oriented to person, place, and time. No cranial nerve deficit. Gait normal. Coordination normal.  Skin: Skin is warm and dry. No rash noted.  Psychiatric: Affect and judgment normal.      Assessment & Plan  1. SIRS (systemic inflammatory response syndrome) (HCC) CBC on return to office  2. Acute exacerbation of chronic obstructive pulmonary disease (COPD) (HCC) Spiriva and Symbicort  3. Diabetes mellitus with nephropathy [facility]) Referral to dietitian and ophthalmology - Ambulatory referral to Ophthalmology  4. Traumatic rhabdomyolysis, initial encounter [facility]) We will recheck CPK and aldolase brought her restarting statin  5. Essential hypertension Well-controlled  6. Hyperlipidemia Hold statin for now and be rhabdomyolysis

## 2015-08-13 ENCOUNTER — Ambulatory Visit: Payer: Medicare Other | Admitting: Family Medicine

## 2015-08-19 ENCOUNTER — Ambulatory Visit
Admission: RE | Admit: 2015-08-19 | Discharge: 2015-08-19 | Disposition: A | Discharge status: Home / Self Care | Payer: Medicare Other | Source: Ambulatory Visit | Attending: Family Medicine | Admitting: Family Medicine

## 2015-08-19 ENCOUNTER — Ambulatory Visit (INDEPENDENT_AMBULATORY_CARE_PROVIDER_SITE_OTHER): Payer: Medicare Other | Admitting: Family Medicine

## 2015-08-19 ENCOUNTER — Encounter: Payer: Self-pay | Admitting: Family Medicine

## 2015-08-19 VITALS — BP 120/80 | HR 81 | Temp 98.5°F | Resp 16 | Ht 68.0 in | Wt 169.7 lb

## 2015-08-19 DIAGNOSIS — J189 Pneumonia, unspecified organism: Secondary | ICD-10-CM | POA: Diagnosis not present

## 2015-08-19 DIAGNOSIS — E1121 Type 2 diabetes mellitus with diabetic nephropathy: Secondary | ICD-10-CM

## 2015-08-19 DIAGNOSIS — R059 Cough, unspecified: Secondary | ICD-10-CM

## 2015-08-19 DIAGNOSIS — R05 Cough: Secondary | ICD-10-CM | POA: Insufficient documentation

## 2015-08-19 DIAGNOSIS — J431 Panlobular emphysema: Secondary | ICD-10-CM | POA: Diagnosis not present

## 2015-08-19 DIAGNOSIS — Z87891 Personal history of nicotine dependence: Secondary | ICD-10-CM | POA: Diagnosis not present

## 2015-08-19 DIAGNOSIS — R6510 Systemic inflammatory response syndrome (SIRS) of non-infectious origin without acute organ dysfunction: Secondary | ICD-10-CM

## 2015-08-19 DIAGNOSIS — I10 Essential (primary) hypertension: Secondary | ICD-10-CM

## 2015-08-19 DIAGNOSIS — N529 Male erectile dysfunction, unspecified: Secondary | ICD-10-CM | POA: Insufficient documentation

## 2015-08-19 DIAGNOSIS — N528 Other male erectile dysfunction: Secondary | ICD-10-CM | POA: Diagnosis not present

## 2015-08-19 DIAGNOSIS — J181 Lobar pneumonia, unspecified organism: Principal | ICD-10-CM

## 2015-08-19 MED ORDER — SILDENAFIL CITRATE 20 MG PO TABS: 20.0000 mg | ORAL_TABLET | Freq: Every day | ORAL | Status: DC

## 2015-08-19 NOTE — Progress Notes (Signed)
Name: Connor Aguilar   MRN: [id number]    DOB: 12/19/1939   Date:08/19/2015       Progress Note  Subjective  Chief Complaint  Chief Complaint  Patient presents with  . Pneumonia    2 week follow up hospital    HPI  Follow-up pneumonia  Patient was hospitalized right upper lobe pneumonia. On 1218. He was seen about 2 weeks ago and was improving. He was discharged on azithromycin after having received a combination of Rocephin and azithromycin while hospitalized. He currently has minimal cough and shortness of breath and oximetry here in the office today is 96%.  Follow-up systemic inflammatory response syndrome  Patient was Hospital as above with severe stricture secondary to pneumonia bronchitis and exacerbation of COPD. He's had continued improvement in his energy level. This been no fever or chills. There's been no dizziness or orthostasis.  Diabetes mellitus Sugars have been in the 160-170 range range fasting. Postprandials are not being checked. No episodes of hypoglycemia. There is no neuropathy associated. He does have associated history of renal disease.  Erectile dysfunction  Patient risks his wishes to use generic Viagra for erectile dysfunction. He has no history of cardiac disease and is not taking nitrates.    Past Medical History  Diagnosis Date  . Diabetes mellitus without complication ([place])   . Hypertension   . Hyperlipidemia   . Allergy   . Tobacco abuse     Social History  Substance Use Topics  . Smoking status: Current Every Day Smoker -- 2.00 packs/day    Types: Cigarettes  . Smokeless tobacco: Not on file  . Alcohol Use: No     Current outpatient prescriptions:  .  acetaminophen (TYLENOL) 325 MG tablet, Take 2 tablets (650 mg total) by mouth every 6 (six) hours as needed for mild pain (or Fever >/= 101)., Disp: , Rfl:  .  amLODipine (NORVASC) 5 MG tablet, TAKE ONE (1) TABLET EACH DAY, Disp: 90 tablet, Rfl: 0 .  atorvastatin (LIPITOR) 40 MG  tablet, Take 1 tablet (40 mg total) by mouth at bedtime., Disp: 30 tablet, Rfl: 2 .  cyclobenzaprine (FLEXERIL) 5 MG tablet, TAKE ONE TABLET TWICE DAILY AS NEEDED FOR MUSCLE SPASM, Disp: 60 tablet, Rfl: 0 .  irbesartan-hydrochlorothiazide (AVALIDE) 150-12.5 MG tablet, TAKE TWO TABLETS BY MOUTH DAILY, Disp: 60 tablet, Rfl: 2 .  metFORMIN (GLUCOPHAGE) 500 MG tablet, TAKE ONE TABLET TWICE DAILY WITH A MEAL, Disp: 180 tablet, Rfl: 2 .  mupirocin ointment (BACTROBAN) 2 %, Place 1 application into the nose 2 (two) times daily. 2 times to lips for 10 days, Disp: 22 g, Rfl: 0 .  tiotropium (SPIRIVA HANDIHALER) 18 MCG inhalation capsule, Place 1 capsule (18 mcg total) into inhaler and inhale once., Disp: 30 capsule, Rfl: 12 .  guaiFENesin-dextromethorphan (ROBITUSSIN DM) 100-10 MG/5ML syrup, Take 5 mLs by mouth every 4 (four) hours as needed for cough., Disp: 118 mL, Rfl: 0  No Known Allergies  Review of Systems  Constitutional: Positive for malaise/fatigue. Negative for fever, chills and weight loss.  HENT: Negative for congestion, hearing loss, sore throat and tinnitus.   Eyes: Negative for blurred vision, double vision and redness.  Respiratory: Positive for cough. Negative for hemoptysis and shortness of breath.   Cardiovascular: Negative for chest pain, palpitations, orthopnea, claudication and leg swelling.  Gastrointestinal: Negative for heartburn, nausea, vomiting, diarrhea, constipation and blood in stool.  Genitourinary: Negative for dysuria, urgency, frequency and hematuria.       Erectile dysfunction  Musculoskeletal: Negative for myalgias, back pain, joint pain, falls and neck pain.  Skin: Negative for itching.  Neurological: Negative for dizziness, tingling, tremors, focal weakness, seizures, loss of consciousness, weakness and headaches.  Endo/Heme/Allergies: Does not bruise/bleed easily.  Psychiatric/Behavioral: Negative for depression and substance abuse. The patient is not  nervous/anxious and does not have insomnia.      Objective  Filed Vitals:   08/19/15 1124  BP: 120/80  Pulse: 81  Temp: 98.5 F (36.9 C)  TempSrc: Oral  Resp: 16  Height: 5\' 8"  (1.727 m)  Weight: 169 lb 11.2 oz (76.975 kg)  SpO2: 96%     Physical Exam  Constitutional: He is oriented to person, place, and time and well-developed, well-nourished, and in no distress.  HENT:  Head: Normocephalic.  Eyes: EOM are normal. Pupils are equal, round, and reactive to light.  Neck: Normal range of motion. Neck supple. No thyromegaly present.  Cardiovascular: Normal rate, regular rhythm and normal heart sounds.   No murmur heard. Pulmonary/Chest: Effort normal. No respiratory distress. He has no wheezes.  Diminished breath sounds throughout with hyperresonance to percussion.  Abdominal: Soft. Bowel sounds are normal.  Musculoskeletal: Normal range of motion. He exhibits no edema.  Lymphadenopathy:    He has no cervical adenopathy.  Neurological: He is alert and oriented to person, place, and time. No cranial nerve deficit. Gait normal. Coordination normal.  Skin: Skin is warm and dry. No rash noted.  Psychiatric: Affect and judgment normal.      Assessment & Plan  1. Right upper lobe pneumonia Clinically improved - DG Chest 2 View; Future  2. SIRS (systemic inflammatory response syndrome) (HCC) Clinically improved  3. Cough Secondary to COPD - DG Chest 2 View; Future  4. Panlobular emphysema (HCC) Continue inhalers  5. Essential hypertension Well-controlled  6. Diabetes mellitus with nephropathy ([place]) Well-controlled  7. Other male erectile dysfunction No contraindication to a trial of sildenafil - sildenafil (REVATIO) 20 MG tablet; Take 1 tablet (20 mg total) by mouth daily.  Dispense: 30 tablet; Refill: 1

## 2015-08-25 DIAGNOSIS — E785 Hyperlipidemia, unspecified: Secondary | ICD-10-CM | POA: Diagnosis not present

## 2015-08-26 LAB — LIPID PANEL
CHOLESTEROL TOTAL: 192 mg/dL (ref 100–199)
Chol/HDL Ratio: 4.3 ratio units (ref 0.0–5.0)
HDL: 45 mg/dL (ref >39)
LDL Calculated: 104 mg/dL — ABNORMAL HIGH (ref 0–99)
Triglycerides: 214 mg/dL — ABNORMAL HIGH (ref 0–149)
VLDL CHOLESTEROL CAL: 43 mg/dL — AB (ref 5–40)

## 2015-08-26 LAB — COMPREHENSIVE METABOLIC PANEL
A/G RATIO: 2 (ref 1.1–2.5)
ALK PHOS: 66 IU/L (ref 39–117)
ALT: 19 IU/L (ref 0–44)
AST: 12 IU/L (ref 0–40)
Albumin: 4.7 g/dL (ref 3.5–4.8)
BILIRUBIN TOTAL: 0.6 mg/dL (ref 0.0–1.2)
BUN / CREAT RATIO: 15 (ref 10–22)
BUN: 12 mg/dL (ref 8–27)
CHLORIDE: 94 mmol/L — AB (ref 96–106)
CO2: 26 mmol/L (ref 18–29)
Calcium: 9.8 mg/dL (ref 8.6–10.2)
Creatinine, Ser: 0.8 mg/dL (ref 0.76–1.27)
GFR calc Af Amer: 100 mL/min/{1.73_m2} (ref >59)
GFR calc non Af Amer: 87 mL/min/{1.73_m2} (ref >59)
GLUCOSE: 155 mg/dL — AB (ref 65–99)
Globulin, Total: 2.3 g/dL (ref 1.5–4.5)
POTASSIUM: 4.3 mmol/L (ref 3.5–5.2)
Sodium: 138 mmol/L (ref 134–144)
Total Protein: 7 g/dL (ref 6.0–8.5)

## 2015-08-26 LAB — CBC
Hematocrit: 42.5 % (ref 37.5–51.0)
Hemoglobin: 14.6 g/dL (ref 12.6–17.7)
MCH: 30.8 pg (ref 26.6–33.0)
MCHC: 34.4 g/dL (ref 31.5–35.7)
MCV: 90 fL (ref 79–97)
PLATELETS: 239 10*3/uL (ref 150–379)
RBC: 4.74 x10E6/uL (ref 4.14–5.80)
RDW: 13.5 % (ref 12.3–15.4)
WBC: 6.5 10*3/uL (ref 3.4–10.8)

## 2015-08-26 LAB — TSH: TSH: 2.15 u[IU]/mL (ref 0.450–4.500)

## 2015-08-27 ENCOUNTER — Other Ambulatory Visit: Payer: Self-pay | Admitting: Family Medicine

## 2015-09-22 ENCOUNTER — Other Ambulatory Visit: Payer: Self-pay | Admitting: Family Medicine

## 2015-10-13 ENCOUNTER — Other Ambulatory Visit: Payer: Self-pay | Admitting: Family Medicine

## 2015-10-28 ENCOUNTER — Ambulatory Visit: Payer: Medicare Other | Admitting: Family Medicine

## 2015-10-31 ENCOUNTER — Ambulatory Visit: Payer: Medicare Other | Admitting: Family Medicine

## 2015-11-18 ENCOUNTER — Encounter: Payer: Self-pay | Admitting: Family Medicine

## 2015-11-18 ENCOUNTER — Ambulatory Visit (INDEPENDENT_AMBULATORY_CARE_PROVIDER_SITE_OTHER): Payer: Medicare Other | Admitting: Family Medicine

## 2015-11-18 VITALS — BP 124/72 | HR 80 | Temp 98.1°F | Resp 18 | Ht 68.0 in | Wt 176.1 lb

## 2015-11-18 DIAGNOSIS — E785 Hyperlipidemia, unspecified: Secondary | ICD-10-CM

## 2015-11-18 DIAGNOSIS — R748 Abnormal levels of other serum enzymes: Secondary | ICD-10-CM | POA: Insufficient documentation

## 2015-11-18 DIAGNOSIS — E1165 Type 2 diabetes mellitus with hyperglycemia: Secondary | ICD-10-CM | POA: Diagnosis not present

## 2015-11-18 DIAGNOSIS — IMO0001 Reserved for inherently not codable concepts without codable children: Secondary | ICD-10-CM

## 2015-11-18 HISTORY — DX: Abnormal levels of other serum enzymes: R74.8

## 2015-11-18 LAB — POCT UA - MICROALBUMIN: MICROALBUMIN (UR) POC: NEGATIVE mg/L

## 2015-11-18 LAB — POCT GLYCOSYLATED HEMOGLOBIN (HGB A1C): Hemoglobin A1C: 7.9

## 2015-11-18 LAB — GLUCOSE, POCT (MANUAL RESULT ENTRY): POC Glucose: 216 mg/dl — AB (ref 70–99)

## 2015-11-18 MED ORDER — METFORMIN HCL 1000 MG PO TABS: 1000.0000 mg | ORAL_TABLET | Freq: Two times a day (BID) | ORAL | Status: DC

## 2015-11-18 NOTE — Progress Notes (Signed)
Name: Connor Aguilar   MRN: [id number]    DOB: November 11, 1940   Date:11/18/2015       Progress Note  Subjective  Chief Complaint  Chief Complaint  Patient presents with  . Follow-up    elevated sugar    HPI  Diabetes Mellitus Type 2: Pt. Presents for follow up of elevated blood sugars. He has Diabetes, diagnosed 10 years ago. Currently on Metformin 500 mg daily, last A1c was 6.6% in December 2016, now jumped up to 7.9%. Fasting blood glucose is generally over 200 mg/dL. Patient not particularly compliant with dietary therapy.   Past Medical History  Diagnosis Date  . Diabetes mellitus without complication ([place])   . Hypertension   . Hyperlipidemia   . Allergy   . Tobacco abuse     Past Surgical History  Procedure Laterality Date  . Foot surgery      Family History  Problem Relation Age of Onset  . Diabetes Mother     Social History   Social History  . Marital Status: Married    Spouse Name: N/A  . Number of Children: N/A  . Years of Education: N/A   Occupational History  . Not on file.   Social History Main Topics  . Smoking status: Current Every Day Smoker -- 2.00 packs/day    Types: Cigarettes  . Smokeless tobacco: Not on file  . Alcohol Use: No  . Drug Use: No  . Sexual Activity: No   Other Topics Concern  . Not on file   Social History Narrative     Current outpatient prescriptions:  .  acetaminophen (TYLENOL) 325 MG tablet, Take 2 tablets (650 mg total) by mouth every 6 (six) hours as needed for mild pain (or Fever >/= 101)., Disp: , Rfl:  .  amLODipine (NORVASC) 5 MG tablet, TAKE ONE (1) TABLET EACH DAY, Disp: 90 tablet, Rfl: 0 .  atorvastatin (LIPITOR) 40 MG tablet, Take 1 tablet (40 mg total) by mouth at bedtime., Disp: 30 tablet, Rfl: 2 .  cyclobenzaprine (FLEXERIL) 5 MG tablet, TAKE 1 TABLET TWICE DAILY AS NEEDED FOR MUSCLE SPASM, Disp: 60 tablet, Rfl: 2 .  guaiFENesin-dextromethorphan (ROBITUSSIN DM) 100-10 MG/5ML syrup, Take 5 mLs by mouth  every 4 (four) hours as needed for cough., Disp: 118 mL, Rfl: 0 .  irbesartan-hydrochlorothiazide (AVALIDE) 150-12.5 MG tablet, TAKE TWO TABLETS BY MOUTH DAILY, Disp: 60 tablet, Rfl: 1 .  metFORMIN (GLUCOPHAGE) 500 MG tablet, TAKE ONE TABLET TWICE DAILY WITH A MEAL, Disp: 180 tablet, Rfl: 2 .  mupirocin ointment (BACTROBAN) 2 %, Place 1 application into the nose 2 (two) times daily. 2 times to lips for 10 days, Disp: 22 g, Rfl: 0 .  sildenafil (REVATIO) 20 MG tablet, Take 1 tablet (20 mg total) by mouth daily., Disp: 30 tablet, Rfl: 1 .  tiotropium (SPIRIVA HANDIHALER) 18 MCG inhalation capsule, Place 1 capsule (18 mcg total) into inhaler and inhale once., Disp: 30 capsule, Rfl: 12  No Known Allergies   Review of Systems  Constitutional: Negative for fever and chills.  Eyes: Negative for blurred vision and double vision.  Cardiovascular: Negative for chest pain.  Gastrointestinal: Negative for nausea, vomiting and abdominal pain.  Genitourinary: Negative for dysuria.     Objective  Filed Vitals:   11/18/15 1444  BP: 124/72  Pulse: 80  Temp: 98.1 F (36.7 C)  Resp: 18  Height: 5\' 8"  (1.727 m)  Weight: 176 lb 2 oz (79.89 kg)  SpO2: 95%  Physical Exam  Constitutional: He is oriented to person, place, and time and well-developed, well-nourished, and in no distress.  Cardiovascular: Normal rate and regular rhythm.   Pulmonary/Chest: Effort normal and breath sounds normal.  Neurological: He is alert and oriented to person, place, and time.  Nursing note and vitals reviewed.     Assessment & Plan  1. Uncontrolled type 2 diabetes mellitus without complication, without long-term current use of insulin (HCC) Increase metformin from 500 to1000 mg twice daily, obtain urine Microalbumin. Follow-up in 4-6 weeks. - POCT Glucose (CBG) - POCT HgB A1C - metFORMIN (GLUCOPHAGE) 1000 MG tablet; Take 1 tablet (1,000 mg total) by mouth 2 (two) times daily with a meal.  Dispense: 180  tablet; Refill: 0 - POCT UA - Microalbumin  2. Hyperlipidemia Repeat fasting lipid panel and liver enzymes. Patient not on statin therapy because of an episode of rhabdomyolysis in December 2016, following which statin therapy was discontinued. - Lipid Profile - Comprehensive Metabolic Panel (CMET)  3. Elevated CPK Reviewed creatinine kinase levels, we will repeat to ensure resolution of rhabdomyolysis. - CK (Creatine Kinase)   Orestes Geiman [facility] A[facility] 11/18/2015 3:19 PM

## 2015-11-28 ENCOUNTER — Other Ambulatory Visit: Payer: Self-pay | Admitting: Family Medicine

## 2015-12-04 DIAGNOSIS — E785 Hyperlipidemia, unspecified: Secondary | ICD-10-CM | POA: Diagnosis not present

## 2015-12-04 DIAGNOSIS — R748 Abnormal levels of other serum enzymes: Secondary | ICD-10-CM | POA: Diagnosis not present

## 2015-12-05 LAB — COMPREHENSIVE METABOLIC PANEL
A/G RATIO: 1.7 (ref 1.2–2.2)
ALT: 25 IU/L (ref 0–44)
AST: 32 IU/L (ref 0–40)
Albumin: 4.6 g/dL (ref 3.5–4.8)
Alkaline Phosphatase: 71 IU/L (ref 39–117)
BUN/Creatinine Ratio: 16 (ref 10–24)
BUN: 15 mg/dL (ref 8–27)
Bilirubin Total: 0.5 mg/dL (ref 0.0–1.2)
CO2: 23 mmol/L (ref 18–29)
Calcium: 9.8 mg/dL (ref 8.6–10.2)
Chloride: 89 mmol/L — ABNORMAL LOW (ref 96–106)
Creatinine, Ser: 0.95 mg/dL (ref 0.76–1.27)
GFR, EST AFRICAN AMERICAN: 90 mL/min/{1.73_m2} (ref >59)
GFR, EST NON AFRICAN AMERICAN: 77 mL/min/{1.73_m2} (ref >59)
GLOBULIN, TOTAL: 2.7 g/dL (ref 1.5–4.5)
GLUCOSE: 185 mg/dL — AB (ref 65–99)
POTASSIUM: 5.1 mmol/L (ref 3.5–5.2)
SODIUM: 136 mmol/L (ref 134–144)
TOTAL PROTEIN: 7.3 g/dL (ref 6.0–8.5)

## 2015-12-05 LAB — LIPID PANEL
CHOL/HDL RATIO: 4.2 ratio (ref 0.0–5.0)
Cholesterol, Total: 196 mg/dL (ref 100–199)
HDL: 47 mg/dL (ref >39)
LDL Calculated: 82 mg/dL (ref 0–99)
Triglycerides: 336 mg/dL — ABNORMAL HIGH (ref 0–149)
VLDL CHOLESTEROL CAL: 67 mg/dL — AB (ref 5–40)

## 2015-12-05 LAB — CK: CK TOTAL: 116 U/L (ref 24–204)

## 2015-12-08 ENCOUNTER — Ambulatory Visit: Payer: Medicare Other | Admitting: Family Medicine

## 2015-12-12 ENCOUNTER — Ambulatory Visit (INDEPENDENT_AMBULATORY_CARE_PROVIDER_SITE_OTHER): Payer: Medicare Other | Admitting: Family Medicine

## 2015-12-12 ENCOUNTER — Encounter: Payer: Self-pay | Admitting: Family Medicine

## 2015-12-12 VITALS — BP 122/80 | HR 93 | Temp 98.0°F | Resp 17 | Ht 68.0 in | Wt 175.9 lb

## 2015-12-12 DIAGNOSIS — E785 Hyperlipidemia, unspecified: Secondary | ICD-10-CM

## 2015-12-12 MED ORDER — ROSUVASTATIN CALCIUM 5 MG PO TABS: 5.0000 mg | ORAL_TABLET | Freq: Every day | ORAL | Status: DC

## 2015-12-12 NOTE — Progress Notes (Signed)
Name: Connor Aguilar   MRN: [id number]    DOB: [birth year]   Date:12/12/2015       Progress Note  Subjective  Chief Complaint  Chief Complaint  Patient presents with  . Follow-up    2 wk  . Diabetes  . Hyperlipidemia    Hyperlipidemia This is a new problem. This is a new diagnosis. The problem is uncontrolled. Recent lipid tests were reviewed and are high (Elevated Triglycerides.). Pertinent negatives include no chest pain, myalgias or shortness of breath.    Past Medical History  Diagnosis Date  . Diabetes mellitus without complication ([place])   . Hypertension   . Hyperlipidemia   . Allergy   . Tobacco abuse     Past Surgical History  Procedure Laterality Date  . Foot surgery      Family History  Problem Relation Age of Onset  . Diabetes Mother     Social History   Social History  . Marital Status: Married    Spouse Name: N/A  . Number of Children: N/A  . Years of Education: N/A   Occupational History  . Not on file.   Social History Main Topics  . Smoking status: Current Every Day Smoker -- 2.00 packs/day    Types: Cigarettes  . Smokeless tobacco: Not on file  . Alcohol Use: No  . Drug Use: No  . Sexual Activity: No   Other Topics Concern  . Not on file   Social History Narrative     Current outpatient prescriptions:  .  acetaminophen (TYLENOL) 325 MG tablet, Take 2 tablets (650 mg total) by mouth every 6 (six) hours as needed for mild pain (or Fever >/= 101)., Disp: , Rfl:  .  amLODipine (NORVASC) 5 MG tablet, TAKE ONE (1) TABLET EACH DAY, Disp: 90 tablet, Rfl: 0 .  atorvastatin (LIPITOR) 40 MG tablet, Take 1 tablet (40 mg total) by mouth at bedtime., Disp: 30 tablet, Rfl: 2 .  cyclobenzaprine (FLEXERIL) 5 MG tablet, TAKE 1 TABLET TWICE DAILY AS NEEDED FOR MUSCLE SPASM, Disp: 60 tablet, Rfl: 2 .  irbesartan-hydrochlorothiazide (AVALIDE) 150-12.5 MG tablet, TAKE TWO TABLETS BY MOUTH DAILY, Disp: 60 tablet, Rfl: 1 .  metFORMIN (GLUCOPHAGE) 1000 MG  tablet, Take 1 tablet (1,000 mg total) by mouth 2 (two) times daily with a meal., Disp: 180 tablet, Rfl: 0 .  sildenafil (REVATIO) 20 MG tablet, Take 1 tablet (20 mg total) by mouth daily., Disp: 30 tablet, Rfl: 1 .  tiotropium (SPIRIVA HANDIHALER) 18 MCG inhalation capsule, Place 1 capsule (18 mcg total) into inhaler and inhale once., Disp: 30 capsule, Rfl: 12 .  guaiFENesin-dextromethorphan (ROBITUSSIN DM) 100-10 MG/5ML syrup, Take 5 mLs by mouth every 4 (four) hours as needed for cough. (Patient not taking: Reported on 12/12/2015), Disp: 118 mL, Rfl: 0 .  mupirocin ointment (BACTROBAN) 2 %, Place 1 application into the nose 2 (two) times daily. 2 times to lips for 10 days (Patient not taking: Reported on 12/12/2015), Disp: 22 g, Rfl: 0  No Known Allergies   Review of Systems  Eyes: Negative for blurred vision.  Respiratory: Negative for cough and shortness of breath.   Cardiovascular: Negative for chest pain.  Musculoskeletal: Negative for myalgias.  Neurological: Negative for headaches.     Objective  Filed Vitals:   12/12/15 1119  BP: 122/80  Pulse: 93  Temp: 98 F (36.7 C)  TempSrc: Oral  Resp: 17  Height: 5\' 8"  (1.727 m)  Weight: 175 lb 14.4 oz (79.788 kg)  SpO2: 97%    Physical Exam  Constitutional: He is oriented to person, place, and time and well-developed, well-nourished, and in no distress.  HENT:  Head: Normocephalic and atraumatic.  Cardiovascular: Normal rate and regular rhythm.   Pulmonary/Chest: Effort normal and breath sounds normal.  Abdominal: Soft. Bowel sounds are normal.  Neurological: He is alert and oriented to person, place, and time.  Nursing note and vitals reviewed.       Assessment & Plan  1. Hyperlipidemia We'll start on Crestor 5 mg at bedtime for primary prevention of CVD and treatment of hypertriglyceridemia. Liver enzymes and CK levels have been reviewed. Advised to contact us if he experiences any muscle ache or other symptoms after  initiation of statin therapy. Verbalized agreement. Recheck levels in 3-4 months. - rosuvastatin (CRESTOR) 5 MG tablet; Take 1 tablet (5 mg total) by mouth at bedtime.  Dispense: 30 tablet; Refill: 2   Emylia Latella [facility]ad A[facility] 12/12/2015 11:30 AM

## 2015-12-18 ENCOUNTER — Ambulatory Visit: Payer: Medicare Other | Admitting: Family Medicine

## 2015-12-29 ENCOUNTER — Other Ambulatory Visit: Payer: Self-pay | Admitting: Family Medicine

## 2016-01-02 ENCOUNTER — Telehealth: Payer: Self-pay | Admitting: Family Medicine

## 2016-01-02 MED ORDER — CYCLOBENZAPRINE HCL 5 MG PO TABS: 5.0000 mg | ORAL_TABLET | Freq: Two times a day (BID) | ORAL | Status: DC | PRN

## 2016-01-02 NOTE — Telephone Encounter (Signed)
PT NEEDS REFILL OF HIS MUSCLE RELAXER. SAYS THAT THE PHARM HAS SENT IN REQUEST FOR REFILL BUT HAS HAD NOT RESPONSE. PT HAD AN APPT WITH YOU ON A FEW WEEKS AGO. PHARM IS [facility]

## 2016-01-02 NOTE — Telephone Encounter (Signed)
Prescription for cyclobenzaprine has been sent to patient's pharmacy

## 2016-01-07 ENCOUNTER — Ambulatory Visit: Payer: Medicare Other | Admitting: Family Medicine

## 2016-01-28 ENCOUNTER — Other Ambulatory Visit: Payer: Self-pay

## 2016-01-28 ENCOUNTER — Ambulatory Visit (INDEPENDENT_AMBULATORY_CARE_PROVIDER_SITE_OTHER): Payer: Medicare Other | Admitting: Family Medicine

## 2016-01-28 ENCOUNTER — Other Ambulatory Visit: Payer: Self-pay | Admitting: Family Medicine

## 2016-01-28 ENCOUNTER — Encounter: Payer: Self-pay | Admitting: Family Medicine

## 2016-01-28 VITALS — BP 122/70 | HR 85 | Temp 98.5°F | Resp 17 | Ht 68.0 in | Wt 171.7 lb

## 2016-01-28 DIAGNOSIS — R05 Cough: Secondary | ICD-10-CM

## 2016-01-28 DIAGNOSIS — R058 Other specified cough: Secondary | ICD-10-CM

## 2016-01-28 MED ORDER — GUAIFENESIN-CODEINE 100-10 MG/5ML PO SYRP: 10.0000 mL | ORAL_SOLUTION | Freq: Three times a day (TID) | ORAL | Status: DC | PRN

## 2016-01-28 MED ORDER — AZITHROMYCIN 250 MG PO TABS: ORAL_TABLET | ORAL | Status: DC

## 2016-01-28 NOTE — Progress Notes (Signed)
Name: Connor Aguilar   MRN: [id number]    DOB: [birth year]   Date:01/28/2016       Progress Note  Subjective  Chief Complaint  Chief Complaint  Patient presents with  . Nasal Congestion  . Cough    2-4 weeks    Cough This is a new problem. Episode onset: over 1 week ago. The problem has been unchanged. The cough is productive of sputum. Associated symptoms include nasal congestion and a sore throat. Pertinent negatives include no chest pain, chills, fever, shortness of breath or wheezing. The symptoms are aggravated by other (Cold water makes his cough worse.). He has tried nothing for the symptoms.    Past Medical History  Diagnosis Date  . Diabetes mellitus without complication ([name])   . Hypertension   . Hyperlipidemia   . Allergy   . Tobacco abuse     Past Surgical History  Procedure Laterality Date  . Foot surgery      Family History  Problem Relation Age of Onset  . Diabetes Mother     Social History   Social History  . Marital Status: Married    Spouse Name: N/A  . Number of Children: N/A  . Years of Education: N/A   Occupational History  . Not on file.   Social History Main Topics  . Smoking status: Current Every Day Smoker -- 2.00 packs/day    Types: Cigarettes  . Smokeless tobacco: Not on file  . Alcohol Use: No  . Drug Use: No  . Sexual Activity: No   Other Topics Concern  . Not on file   Social History Narrative     Current outpatient prescriptions:  .  acetaminophen (TYLENOL) 325 MG tablet, Take 2 tablets (650 mg total) by mouth every 6 (six) hours as needed for mild pain (or Fever >/= 101)., Disp: , Rfl:  .  amLODipine (NORVASC) 5 MG tablet, TAKE ONE (1) TABLET EACH DAY, Disp: 90 tablet, Rfl: 0 .  cyclobenzaprine (FLEXERIL) 5 MG tablet, Take 1 tablet (5 mg total) by mouth 2 (two) times daily as needed for muscle spasms., Disp: 60 tablet, Rfl: 2 .  irbesartan-hydrochlorothiazide (AVALIDE) 150-12.5 MG tablet, TAKE TWO TABLETS BY MOUTH DAILY,  Disp: 60 tablet, Rfl: 1 .  metFORMIN (GLUCOPHAGE) 1000 MG tablet, Take 1 tablet (1,000 mg total) by mouth 2 (two) times daily with a meal., Disp: 180 tablet, Rfl: 0 .  mupirocin ointment (BACTROBAN) 2 %, Place 1 application into the nose 2 (two) times daily. 2 times to lips for 10 days, Disp: 22 g, Rfl: 0 .  rosuvastatin (CRESTOR) 5 MG tablet, Take 1 tablet (5 mg total) by mouth at bedtime., Disp: 30 tablet, Rfl: 2 .  sildenafil (REVATIO) 20 MG tablet, Take 1 tablet (20 mg total) by mouth daily., Disp: 30 tablet, Rfl: 1 .  tiotropium (SPIRIVA HANDIHALER) 18 MCG inhalation capsule, Place 1 capsule (18 mcg total) into inhaler and inhale once., Disp: 30 capsule, Rfl: 12 .  guaiFENesin-dextromethorphan (ROBITUSSIN DM) 100-10 MG/5ML syrup, Take 5 mLs by mouth every 4 (four) hours as needed for cough. (Patient not taking: Reported on 12/12/2015), Disp: 118 mL, Rfl: 0  No Known Allergies   Review of Systems  Constitutional: Negative for fever and chills.  HENT: Positive for sore throat.   Respiratory: Positive for cough and sputum production. Negative for shortness of breath and wheezing.   Cardiovascular: Negative for chest pain.     Objective  Filed Vitals:   01/28/16 1024  BP: 122/70  Pulse: 85  Temp: 98.5 F (36.9 C)  TempSrc: Oral  Resp: 17  Height: 5\' 8"  (1.727 m)  Weight: 171 lb 11.2 oz (77.883 kg)  SpO2: 97%    Physical Exam  Constitutional: He is well-developed, well-nourished, and in no distress.  HENT:  Head: Normocephalic and atraumatic.  Nose: Right sinus exhibits no maxillary sinus tenderness and no frontal sinus tenderness. Left sinus exhibits no maxillary sinus tenderness and no frontal sinus tenderness.  Mouth/Throat: Posterior oropharyngeal erythema present. No oropharyngeal exudate or posterior oropharyngeal edema.  Neck: Neck supple.  Cardiovascular: Normal rate, regular rhythm, S1 normal, S2 normal and normal heart sounds.   No murmur heard. Pulmonary/Chest:  Breath sounds normal. No respiratory distress. He has no wheezes. He has no rales.  Nursing note and vitals reviewed.     Assessment & Plan  1. Recurrent productive cough Likely protracted cough from URI. Will start on antibiotic therapy, along with antitussive for relief. - guaiFENesin-codeine (CHERATUSSIN AC) 100-10 MG/5ML syrup; Take 10 mLs by mouth 3 (three) times daily as needed for cough.  Dispense: 120 mL; Refill: 0 - azithromycin (ZITHROMAX) 250 MG tablet; 2 tabs po day 1, then 1 tab po q day x 4 days  Dispense: 6 each; Refill: 0   [name] [facility]ad A[facility] 01/28/2016 10:47 AM

## 2016-02-04 ENCOUNTER — Telehealth: Payer: Self-pay | Admitting: Family Medicine

## 2016-02-04 NOTE — Telephone Encounter (Signed)
PT SAID THAT HE IS NEEDING REFILL ON AVALIDE FOR HIS BLOOD PRESSURE. HE SAID THAT HE HAD AN APPT JUST A WEEK OR SO AGO. PHARM [facility]. PT SAID THAT HE IS TOTALLY OUT

## 2016-02-05 MED ORDER — AMLODIPINE BESYLATE 5 MG PO TABS: ORAL_TABLET | ORAL | Status: DC

## 2016-02-05 NOTE — Telephone Encounter (Signed)
Medication has been refilled and sent to [facility]

## 2016-02-06 ENCOUNTER — Other Ambulatory Visit: Payer: Self-pay | Admitting: Family Medicine

## 2016-02-11 ENCOUNTER — Ambulatory Visit (INDEPENDENT_AMBULATORY_CARE_PROVIDER_SITE_OTHER): Payer: Medicare Other | Admitting: Family Medicine

## 2016-02-11 ENCOUNTER — Encounter: Payer: Self-pay | Admitting: Family Medicine

## 2016-02-11 VITALS — BP 127/70 | HR 94 | Temp 98.3°F | Resp 17 | Ht 68.0 in | Wt 172.8 lb

## 2016-02-11 DIAGNOSIS — E1165 Type 2 diabetes mellitus with hyperglycemia: Secondary | ICD-10-CM | POA: Diagnosis not present

## 2016-02-11 DIAGNOSIS — I10 Essential (primary) hypertension: Secondary | ICD-10-CM | POA: Diagnosis not present

## 2016-02-11 DIAGNOSIS — IMO0001 Reserved for inherently not codable concepts without codable children: Secondary | ICD-10-CM

## 2016-02-11 MED ORDER — AMLODIPINE BESYLATE 5 MG PO TABS: ORAL_TABLET | ORAL | Status: DC

## 2016-02-11 MED ORDER — IRBESARTAN-HYDROCHLOROTHIAZIDE 150-12.5 MG PO TABS: 2.0000 | ORAL_TABLET | Freq: Every day | ORAL | Status: DC

## 2016-02-11 MED ORDER — METFORMIN HCL 1000 MG PO TABS: 1000.0000 mg | ORAL_TABLET | Freq: Two times a day (BID) | ORAL | Status: DC

## 2016-02-11 NOTE — Progress Notes (Signed)
Name: Connor Aguilar   MRN: [id number]    DOB: 1940/09/18   Date:02/11/2016       Progress Note  Subjective  Chief Complaint  Chief Complaint  Patient presents with  . Medication Refill    Hypertension This is a chronic problem. The problem is controlled. Pertinent negatives include no blurred vision, chest pain, headaches or palpitations. Past treatments include angiotensin blockers, calcium channel blockers and diuretics.     Past Medical History  Diagnosis Date  . Diabetes mellitus without complication ([place])   . Hypertension   . Hyperlipidemia   . Allergy   . Tobacco abuse     Past Surgical History  Procedure Laterality Date  . Foot surgery      Family History  Problem Relation Age of Onset  . Diabetes Mother     Social History   Social History  . Marital Status: Married    Spouse Name: N/A  . Number of Children: N/A  . Years of Education: N/A   Occupational History  . Not on file.   Social History Main Topics  . Smoking status: Current Every Day Smoker -- 2.00 packs/day    Types: Cigarettes  . Smokeless tobacco: Not on file  . Alcohol Use: No  . Drug Use: No  . Sexual Activity: No   Other Topics Concern  . Not on file   Social History Narrative     Current outpatient prescriptions:  .  acetaminophen (TYLENOL) 325 MG tablet, Take 2 tablets (650 mg total) by mouth every 6 (six) hours as needed for mild pain (or Fever >/= 101)., Disp: , Rfl:  .  amLODipine (NORVASC) 5 MG tablet, TAKE ONE (1) TABLET EACH DAY, Disp: 90 tablet, Rfl: 0 .  cyclobenzaprine (FLEXERIL) 5 MG tablet, Take 1 tablet (5 mg total) by mouth 2 (two) times daily as needed for muscle spasms., Disp: 60 tablet, Rfl: 2 .  irbesartan-hydrochlorothiazide (AVALIDE) 150-12.5 MG tablet, TAKE TWO TABLETS BY MOUTH DAILY, Disp: 60 tablet, Rfl: 1 .  metFORMIN (GLUCOPHAGE) 1000 MG tablet, Take 1 tablet (1,000 mg total) by mouth 2 (two) times daily with a meal., Disp: 180 tablet, Rfl: 0 .  mupirocin  ointment (BACTROBAN) 2 %, Place 1 application into the nose 2 (two) times daily. 2 times to lips for 10 days, Disp: 22 g, Rfl: 0 .  rosuvastatin (CRESTOR) 5 MG tablet, Take 1 tablet (5 mg total) by mouth at bedtime., Disp: 30 tablet, Rfl: 2 .  sildenafil (REVATIO) 20 MG tablet, Take 1 tablet (20 mg total) by mouth daily., Disp: 30 tablet, Rfl: 1 .  tiotropium (SPIRIVA HANDIHALER) 18 MCG inhalation capsule, Place 1 capsule (18 mcg total) into inhaler and inhale once., Disp: 30 capsule, Rfl: 12 .  azithromycin (ZITHROMAX) 250 MG tablet, 2 tabs po day 1, then 1 tab po q day x 4 days (Patient not taking: Reported on 02/11/2016), Disp: 6 each, Rfl: 0 .  guaiFENesin-codeine (CHERATUSSIN AC) 100-10 MG/5ML syrup, Take 10 mLs by mouth 3 (three) times daily as needed for cough. (Patient not taking: Reported on 02/11/2016), Disp: 120 mL, Rfl: 0  No Known Allergies   Review of Systems  Eyes: Negative for blurred vision.  Cardiovascular: Negative for chest pain and palpitations.  Neurological: Negative for headaches.    Objective  Filed Vitals:   02/11/16 1558  BP: 127/70  Pulse: 94  Temp: 98.3 F (36.8 C)  TempSrc: Oral  Resp: 17  Height: 5\' 8"  (1.727 m)  Weight: 172  lb 12.8 oz (78.382 kg)  SpO2: 98%    Physical Exam  Constitutional: He is oriented to person, place, and time and well-developed, well-nourished, and in no distress.  HENT:  Head: Normocephalic and atraumatic.  Cardiovascular: Normal rate, regular rhythm and normal heart sounds.   No murmur heard. Pulmonary/Chest: Effort normal and breath sounds normal.  Neurological: He is alert and oriented to person, place, and time.  Nursing note and vitals reviewed.      Assessment & Plan  1. Essential hypertension Blood pressure stable and controlled on present antihypertensive therapy - amLODipine (NORVASC) 5 MG tablet; TAKE ONE (1) TABLET EACH DAY  Dispense: 90 tablet; Refill: 0 - irbesartan-hydrochlorothiazide (AVALIDE)  150-12.5 MG tablet; Take 2 tablets by mouth daily.  Dispense: 180 tablet; Refill: 1  2. Uncontrolled type 2 diabetes mellitus without complication, without long-term current use of insulin (HCC)  - metFORMIN (GLUCOPHAGE) 1000 MG tablet; Take 1 tablet (1,000 mg total) by mouth 2 (two) times daily with a meal.  Dispense: 180 tablet; Refill: 1  [name] [facility]ad A[facility] 02/11/2016 4:21 PM

## 2016-02-11 NOTE — Telephone Encounter (Signed)
Patient has appointment this afternoon but is needing this refill this morning.

## 2016-02-13 ENCOUNTER — Other Ambulatory Visit: Payer: Self-pay | Admitting: Family Medicine

## 2016-03-12 ENCOUNTER — Ambulatory Visit: Payer: Medicare Other | Admitting: Family Medicine

## 2016-03-26 ENCOUNTER — Other Ambulatory Visit: Payer: Self-pay | Admitting: Family Medicine

## 2016-05-11 ENCOUNTER — Telehealth: Payer: Self-pay | Admitting: Family Medicine

## 2016-05-13 ENCOUNTER — Ambulatory Visit: Payer: Medicare Other | Admitting: Family Medicine

## 2016-05-17 NOTE — Telephone Encounter (Signed)
Pt scheduled vaccination 10/12 -closing encounter

## 2016-05-20 ENCOUNTER — Ambulatory Visit: Payer: Medicare Other | Admitting: Family Medicine

## 2016-05-26 ENCOUNTER — Encounter: Payer: Self-pay | Admitting: Family Medicine

## 2016-05-26 ENCOUNTER — Ambulatory Visit (INDEPENDENT_AMBULATORY_CARE_PROVIDER_SITE_OTHER): Payer: Medicare Other | Admitting: Family Medicine

## 2016-05-26 VITALS — BP 125/67 | HR 85 | Temp 99.1°F | Resp 17 | Ht 68.0 in | Wt 174.8 lb

## 2016-05-26 DIAGNOSIS — E1165 Type 2 diabetes mellitus with hyperglycemia: Secondary | ICD-10-CM

## 2016-05-26 DIAGNOSIS — E785 Hyperlipidemia, unspecified: Secondary | ICD-10-CM | POA: Diagnosis not present

## 2016-05-26 DIAGNOSIS — IMO0001 Reserved for inherently not codable concepts without codable children: Secondary | ICD-10-CM

## 2016-05-26 DIAGNOSIS — I10 Essential (primary) hypertension: Secondary | ICD-10-CM | POA: Diagnosis not present

## 2016-05-26 LAB — GLUCOSE, POCT (MANUAL RESULT ENTRY): POC GLUCOSE: 222 mg/dL — AB (ref 70–99)

## 2016-05-26 LAB — POCT GLYCOSYLATED HEMOGLOBIN (HGB A1C): HEMOGLOBIN A1C: 7.7

## 2016-05-26 MED ORDER — ROSUVASTATIN CALCIUM 5 MG PO TABS: ORAL_TABLET | ORAL | 1 refills | Status: DC

## 2016-05-26 MED ORDER — METFORMIN HCL 1000 MG PO TABS: 1000.0000 mg | ORAL_TABLET | Freq: Two times a day (BID) | ORAL | 1 refills | Status: DC

## 2016-05-26 MED ORDER — AMLODIPINE BESYLATE 5 MG PO TABS: ORAL_TABLET | ORAL | 0 refills | Status: DC

## 2016-05-26 MED ORDER — IRBESARTAN-HYDROCHLOROTHIAZIDE 150-12.5 MG PO TABS: 2.0000 | ORAL_TABLET | Freq: Every day | ORAL | 1 refills | Status: DC

## 2016-05-26 NOTE — Progress Notes (Signed)
Name: Connor Aguilar   MRN: [id number]    DOB: Jan 01, 1941   Date:05/26/2016       Progress Note  Subjective  Chief Complaint  Chief Complaint  Patient presents with  . Follow-up    3 mo    Diabetes  He presents for his follow-up diabetic visit. He has type 2 diabetes mellitus. His disease course has been improving. Pertinent negatives for hypoglycemia include no headaches. Pertinent negatives for diabetes include no blurred vision, no chest pain, no fatigue, no polydipsia and no polyuria. Current diabetic treatment includes oral agent (monotherapy). Frequency home blood tests: does not check Blood Glucose every day. An ACE inhibitor/angiotensin II receptor blocker is being taken.  Hypertension  This is a chronic problem. The problem is unchanged. The problem is controlled. Pertinent negatives include no blurred vision, chest pain, headaches, palpitations or shortness of breath. Past treatments include angiotensin blockers and diuretics.  Hyperlipidemia  This is a chronic problem. The problem is uncontrolled. Recent lipid tests were reviewed and are high. Pertinent negatives include no chest pain, leg pain, myalgias or shortness of breath. Current antihyperlipidemic treatment includes statins.    Past Medical History:  Diagnosis Date  . Allergy   . Diabetes mellitus without complication ([place])   . Hyperlipidemia   . Hypertension   . Tobacco abuse     Past Surgical History:  Procedure Laterality Date  . FOOT SURGERY      Family History  Problem Relation Age of Onset  . Diabetes Mother     Social History   Social History  . Marital status: Married    Spouse name: N/A  . Number of children: N/A  . Years of education: N/A   Occupational History  . Not on file.   Social History Main Topics  . Smoking status: Current Every Day Smoker    Packs/day: 2.00    Types: Cigarettes  . Smokeless tobacco: Never Used  . Alcohol use No  . Drug use: No  . Sexual activity: No    Other Topics Concern  . Not on file   Social History Narrative  . No narrative on file     Current Outpatient Prescriptions:  .  acetaminophen (TYLENOL) 325 MG tablet, Take 2 tablets (650 mg total) by mouth every 6 (six) hours as needed for mild pain (or Fever >/= 101)., Disp: , Rfl:  .  amLODipine (NORVASC) 5 MG tablet, TAKE ONE (1) TABLET EACH DAY, Disp: 90 tablet, Rfl: 0 .  cyclobenzaprine (FLEXERIL) 5 MG tablet, TAKE ONE TABLET TWICE DAILY AS NEEDED FOR MUSCLE SPASMS, Disp: 60 tablet, Rfl: 2 .  irbesartan-hydrochlorothiazide (AVALIDE) 150-12.5 MG tablet, Take 2 tablets by mouth daily., Disp: 180 tablet, Rfl: 1 .  metFORMIN (GLUCOPHAGE) 1000 MG tablet, Take 1 tablet (1,000 mg total) by mouth 2 (two) times daily with a meal., Disp: 180 tablet, Rfl: 1 .  mupirocin ointment (BACTROBAN) 2 %, Place 1 application into the nose 2 (two) times daily. 2 times to lips for 10 days, Disp: 22 g, Rfl: 0 .  rosuvastatin (CRESTOR) 5 MG tablet, TAKE ONE (1) TABLET AT BEDTIME, Disp: 90 tablet, Rfl: 1 .  sildenafil (REVATIO) 20 MG tablet, Take 1 tablet (20 mg total) by mouth daily., Disp: 30 tablet, Rfl: 1 .  tiotropium (SPIRIVA HANDIHALER) 18 MCG inhalation capsule, Place 1 capsule (18 mcg total) into inhaler and inhale once., Disp: 30 capsule, Rfl: 12  No Known Allergies   Review of Systems  Constitutional: Negative for fatigue.  Eyes: Negative for blurred vision.  Respiratory: Negative for shortness of breath.   Cardiovascular: Negative for chest pain and palpitations.  Musculoskeletal: Negative for myalgias.  Neurological: Negative for headaches.  Endo/Heme/Allergies: Negative for polydipsia.    Objective  Vitals:   05/26/16 1428  BP: 125/67  Pulse: 85  Resp: 17  Temp: 99.1 F (37.3 C)  TempSrc: Oral  SpO2: 96%  Weight: 174 lb 12.8 oz (79.3 kg)  Height: 5\' 8"  (1.727 m)    Physical Exam  Constitutional: He is oriented to person, place, and time and well-developed,  well-nourished, and in no distress.  HENT:  Head: Normocephalic and atraumatic.  Cardiovascular: Normal rate, regular rhythm and normal heart sounds.   No murmur heard. Pulmonary/Chest: Effort normal and breath sounds normal.  Abdominal: Soft. Bowel sounds are normal. There is no tenderness.  Neurological: He is alert and oriented to person, place, and time.  Psychiatric: Mood, memory, affect and judgment normal.  Nursing note and vitals reviewed.   Assessment & Plan  1. Uncontrolled type 2 diabetes mellitus without complication, without long-term current use of insulin (HCC) A1c improved from 7.9% to 7.7%, no change in pharmacotherapy. Encouraged patient to continue with dietary compliance - metFORMIN (GLUCOPHAGE) 1000 MG tablet; Take 1 tablet (1,000 mg total) by mouth 2 (two) times daily with a meal.  Dispense: 180 tablet; Refill: 1 - POCT HgB A1C - POCT Glucose (CBG)  2. Essential hypertension  - amLODipine (NORVASC) 5 MG tablet; TAKE ONE (1) TABLET EACH DAY  Dispense: 90 tablet; Refill: 0 - irbesartan-hydrochlorothiazide (AVALIDE) 150-12.5 MG tablet; Take 2 tablets by mouth daily.  Dispense: 180 tablet; Refill: 1  3. Hyperlipidemia, unspecified hyperlipidemia type  - Lipid Profile - COMPLETE METABOLIC PANEL WITH GFR - rosuvastatin (CRESTOR) 5 MG tablet; TAKE ONE (1) TABLET AT BEDTIME  Dispense: 90 tablet; Refill: 1  Connor Aguilar [facility] 05/26/2016 2:35 PM

## 2016-06-24 ENCOUNTER — Emergency Department
Admission: EM | Admit: 2016-06-24 | Discharge: 2016-06-24 | Disposition: A | Discharge status: Home / Self Care | Payer: No Typology Code available for payment source | Attending: Emergency Medicine | Admitting: Emergency Medicine

## 2016-06-24 ENCOUNTER — Emergency Department: Payer: No Typology Code available for payment source

## 2016-06-24 DIAGNOSIS — S60512A Abrasion of left hand, initial encounter: Secondary | ICD-10-CM | POA: Diagnosis not present

## 2016-06-24 DIAGNOSIS — F17210 Nicotine dependence, cigarettes, uncomplicated: Secondary | ICD-10-CM | POA: Diagnosis not present

## 2016-06-24 DIAGNOSIS — M542 Cervicalgia: Secondary | ICD-10-CM | POA: Diagnosis not present

## 2016-06-24 DIAGNOSIS — Y999 Unspecified external cause status: Secondary | ICD-10-CM | POA: Insufficient documentation

## 2016-06-24 DIAGNOSIS — Y939 Activity, unspecified: Secondary | ICD-10-CM | POA: Diagnosis not present

## 2016-06-24 DIAGNOSIS — S46912A Strain of unspecified muscle, fascia and tendon at shoulder and upper arm level, left arm, initial encounter: Secondary | ICD-10-CM | POA: Diagnosis not present

## 2016-06-24 DIAGNOSIS — Y92410 Unspecified street and highway as the place of occurrence of the external cause: Secondary | ICD-10-CM | POA: Diagnosis not present

## 2016-06-24 DIAGNOSIS — Z79899 Other long term (current) drug therapy: Secondary | ICD-10-CM | POA: Diagnosis not present

## 2016-06-24 DIAGNOSIS — S4992XA Unspecified injury of left shoulder and upper arm, initial encounter: Secondary | ICD-10-CM | POA: Diagnosis not present

## 2016-06-24 DIAGNOSIS — I10 Essential (primary) hypertension: Secondary | ICD-10-CM | POA: Diagnosis not present

## 2016-06-24 DIAGNOSIS — Z7984 Long term (current) use of oral hypoglycemic drugs: Secondary | ICD-10-CM | POA: Diagnosis not present

## 2016-06-24 DIAGNOSIS — S161XXA Strain of muscle, fascia and tendon at neck level, initial encounter: Secondary | ICD-10-CM | POA: Insufficient documentation

## 2016-06-24 DIAGNOSIS — S0001XA Abrasion of scalp, initial encounter: Secondary | ICD-10-CM | POA: Diagnosis not present

## 2016-06-24 DIAGNOSIS — E119 Type 2 diabetes mellitus without complications: Secondary | ICD-10-CM | POA: Insufficient documentation

## 2016-06-24 DIAGNOSIS — M25512 Pain in left shoulder: Secondary | ICD-10-CM | POA: Diagnosis not present

## 2016-06-24 DIAGNOSIS — S169XXA Unspecified injury of muscle, fascia and tendon at neck level, initial encounter: Secondary | ICD-10-CM | POA: Diagnosis present

## 2016-06-24 DIAGNOSIS — S199XXA Unspecified injury of neck, initial encounter: Secondary | ICD-10-CM | POA: Diagnosis not present

## 2016-06-24 MED ORDER — CYCLOBENZAPRINE HCL 10 MG PO TABS: 10.0000 mg | ORAL_TABLET | Freq: Three times a day (TID) | ORAL | 0 refills | Status: DC | PRN

## 2016-06-24 MED ORDER — BACITRACIN ZINC 500 UNIT/GM EX OINT: TOPICAL_OINTMENT | Freq: Two times a day (BID) | CUTANEOUS | Status: DC

## 2016-06-24 MED ORDER — IBUPROFEN 800 MG PO TABS: 800.0000 mg | ORAL_TABLET | Freq: Three times a day (TID) | ORAL | 0 refills | Status: DC | PRN

## 2016-06-24 MED ORDER — HYDROCODONE-ACETAMINOPHEN 5-325 MG PO TABS: 1.0000 | ORAL_TABLET | ORAL | 0 refills | Status: DC | PRN

## 2016-06-24 NOTE — Discharge Instructions (Signed)
Take pain medicine as directed. Follow-up with your PCP if not improved. Return to emergency room for any worsening symptoms.
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
## 2016-06-24 NOTE — ED Notes (Signed)
Abrasions on left hand cleaned with soap and water and bandage applied. Patient tolerated procedure well.
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
## 2016-07-07 ENCOUNTER — Encounter: Payer: Self-pay | Admitting: Family Medicine

## 2016-07-07 ENCOUNTER — Ambulatory Visit (INDEPENDENT_AMBULATORY_CARE_PROVIDER_SITE_OTHER): Payer: Medicare Other | Admitting: Family Medicine

## 2016-07-07 DIAGNOSIS — J069 Acute upper respiratory infection, unspecified: Secondary | ICD-10-CM | POA: Diagnosis not present

## 2016-07-07 DIAGNOSIS — B9789 Other viral agents as the cause of diseases classified elsewhere: Secondary | ICD-10-CM

## 2016-07-07 MED ORDER — AZITHROMYCIN 250 MG PO TABS: ORAL_TABLET | ORAL | 0 refills | Status: DC

## 2016-07-07 NOTE — Progress Notes (Signed)
Name: Connor Aguilar   MRN: [id number]    DOB: [birth year]   Date:07/07/2016       Progress Note  Subjective  Chief Complaint  Chief Complaint  Patient presents with  . Nasal Congestion    cough, congested, sneezing, runny nose for 3 days    URI   This is a new problem. The current episode started yesterday. The problem has been unchanged. There has been no fever (elevated temperature to 99.63F). Associated symptoms include congestion, coughing, rhinorrhea and sneezing. Pertinent negatives include no chest pain, headaches, neck pain, sinus pain, sore throat or wheezing. He has tried decongestant for the symptoms. The treatment provided no relief.    Past Medical History:  Diagnosis Date  . Allergy   . Diabetes mellitus without complication ([place])   . Hyperlipidemia   . Hypertension   . Tobacco abuse     Past Surgical History:  Procedure Laterality Date  . FOOT SURGERY      Family History  Problem Relation Age of Onset  . Diabetes Mother     Social History   Social History  . Marital status: Married    Spouse name: N/A  . Number of children: N/A  . Years of education: N/A   Occupational History  . Not on file.   Social History Main Topics  . Smoking status: Current Every Day Smoker    Packs/day: 2.00    Types: Cigarettes  . Smokeless tobacco: Never Used  . Alcohol use No  . Drug use: No  . Sexual activity: No   Other Topics Concern  . Not on file   Social History Narrative  . No narrative on file     Current Outpatient Prescriptions:  .  acetaminophen (TYLENOL) 325 MG tablet, Take 2 tablets (650 mg total) by mouth every 6 (six) hours as needed for mild pain (or Fever >/= 101)., Disp: , Rfl:  .  amLODipine (NORVASC) 5 MG tablet, TAKE ONE (1) TABLET EACH DAY, Disp: 90 tablet, Rfl: 0 .  cyclobenzaprine (FLEXERIL) 10 MG tablet, Take 1 tablet (10 mg total) by mouth 3 (three) times daily as needed for muscle spasms., Disp: 21 tablet, Rfl: 0 .   HYDROcodone-acetaminophen (NORCO) 5-325 MG tablet, Take 1 tablet by mouth every 4 (four) hours as needed for moderate pain., Disp: 6 tablet, Rfl: 0 .  ibuprofen (ADVIL,MOTRIN) 800 MG tablet, Take 1 tablet (800 mg total) by mouth every 8 (eight) hours as needed., Disp: 12 tablet, Rfl: 0 .  irbesartan-hydrochlorothiazide (AVALIDE) 150-12.5 MG tablet, Take 2 tablets by mouth daily., Disp: 180 tablet, Rfl: 1 .  metFORMIN (GLUCOPHAGE) 1000 MG tablet, Take 1 tablet (1,000 mg total) by mouth 2 (two) times daily with a meal., Disp: 180 tablet, Rfl: 1 .  mupirocin ointment (BACTROBAN) 2 %, Place 1 application into the nose 2 (two) times daily. 2 times to lips for 10 days, Disp: 22 g, Rfl: 0 .  rosuvastatin (CRESTOR) 5 MG tablet, TAKE ONE (1) TABLET AT BEDTIME, Disp: 90 tablet, Rfl: 1 .  sildenafil (REVATIO) 20 MG tablet, Take 1 tablet (20 mg total) by mouth daily., Disp: 30 tablet, Rfl: 1 .  tiotropium (SPIRIVA HANDIHALER) 18 MCG inhalation capsule, Place 1 capsule (18 mcg total) into inhaler and inhale once., Disp: 30 capsule, Rfl: 12  No Known Allergies   Review of Systems  Constitutional: Positive for fever. Negative for chills.  HENT: Positive for congestion, rhinorrhea and sneezing. Negative for sinus pain and sore throat.  Eyes: Negative for blurred vision and double vision.  Respiratory: Positive for cough and sputum production. Negative for shortness of breath and wheezing.   Cardiovascular: Negative for chest pain.  Musculoskeletal: Negative for neck pain.  Neurological: Negative for headaches.     Objective  Vitals:   07/07/16 1455  BP: 126/72  Pulse: (!) 107  Resp: 16  Temp: 99.9 F (37.7 C)  TempSrc: Oral  SpO2: 97%  Weight: 170 lb 14.4 oz (77.5 kg)  Height: 5\' 8"  (1.727 m)    Physical Exam  Constitutional: He is well-developed, well-nourished, and in no distress.  HENT:  Right Ear: Tympanic membrane and ear canal normal. No drainage or swelling.  Left Ear: Tympanic  membrane and ear canal normal. No drainage or swelling.  Nose: Right sinus exhibits no maxillary sinus tenderness and no frontal sinus tenderness. Left sinus exhibits no maxillary sinus tenderness and no frontal sinus tenderness.  Mouth/Throat: Posterior oropharyngeal erythema present. No oropharyngeal exudate or posterior oropharyngeal edema.  Nasal mucosal inflammation, turbinate hypertrophy  Neck: Neck supple. No edema present.  Cardiovascular: Normal rate, regular rhythm, S1 normal, S2 normal and normal heart sounds.   No murmur heard. Pulmonary/Chest: Effort normal and breath sounds normal. No respiratory distress. He has no decreased breath sounds. He has no wheezes. He has no rhonchi.  Nursing note and vitals reviewed.      Assessment & Plan  1. Viral URI with cough Explained that this is most likely viral and should resolve on its own with conservative treatment. Advised to use Tylenol for fever, gargles to relieve sore throat, and may take an OTC antitussive for cough and congestion. If symptoms do not improve within 3-4 days, he may start on antibiotic therapy. - azithromycin (ZITHROMAX) 250 MG tablet; 2 tabs po day 1, then 1 tab po q day x 4 days  Dispense: 6 tablet; Refill: 0  Britanny Marksberry [facility] 07/07/2016 3:15 PM

## 2016-07-30 ENCOUNTER — Other Ambulatory Visit: Payer: Self-pay | Admitting: Family Medicine

## 2016-08-26 ENCOUNTER — Ambulatory Visit: Payer: Medicare Other | Admitting: Family Medicine

## 2016-08-28 ENCOUNTER — Other Ambulatory Visit: Payer: Self-pay | Admitting: Family Medicine

## 2016-08-28 DIAGNOSIS — I10 Essential (primary) hypertension: Secondary | ICD-10-CM

## 2016-12-28 ENCOUNTER — Other Ambulatory Visit: Payer: Self-pay | Admitting: Family Medicine

## 2017-01-03 ENCOUNTER — Ambulatory Visit
Admission: EM | Admit: 2017-01-03 | Discharge: 2017-01-03 | Disposition: A | Discharge status: Home / Self Care | Payer: Medicare HMO | Attending: Family Medicine | Admitting: Family Medicine

## 2017-01-03 ENCOUNTER — Encounter: Payer: Self-pay | Admitting: Emergency Medicine

## 2017-01-03 ENCOUNTER — Ambulatory Visit: Payer: Medicare HMO

## 2017-01-03 DIAGNOSIS — R69 Illness, unspecified: Secondary | ICD-10-CM | POA: Diagnosis not present

## 2017-01-03 DIAGNOSIS — N529 Male erectile dysfunction, unspecified: Secondary | ICD-10-CM | POA: Insufficient documentation

## 2017-01-03 DIAGNOSIS — R197 Diarrhea, unspecified: Secondary | ICD-10-CM | POA: Diagnosis not present

## 2017-01-03 DIAGNOSIS — E785 Hyperlipidemia, unspecified: Secondary | ICD-10-CM | POA: Insufficient documentation

## 2017-01-03 DIAGNOSIS — I10 Essential (primary) hypertension: Secondary | ICD-10-CM | POA: Diagnosis not present

## 2017-01-03 DIAGNOSIS — F17210 Nicotine dependence, cigarettes, uncomplicated: Secondary | ICD-10-CM | POA: Insufficient documentation

## 2017-01-03 DIAGNOSIS — Z833 Family history of diabetes mellitus: Secondary | ICD-10-CM | POA: Insufficient documentation

## 2017-01-03 DIAGNOSIS — R29898 Other symptoms and signs involving the musculoskeletal system: Secondary | ICD-10-CM

## 2017-01-03 DIAGNOSIS — R918 Other nonspecific abnormal finding of lung field: Secondary | ICD-10-CM | POA: Diagnosis not present

## 2017-01-03 DIAGNOSIS — Z79899 Other long term (current) drug therapy: Secondary | ICD-10-CM | POA: Diagnosis not present

## 2017-01-03 DIAGNOSIS — R5381 Other malaise: Secondary | ICD-10-CM | POA: Diagnosis not present

## 2017-01-03 DIAGNOSIS — Z7984 Long term (current) use of oral hypoglycemic drugs: Secondary | ICD-10-CM | POA: Diagnosis not present

## 2017-01-03 DIAGNOSIS — M6281 Muscle weakness (generalized): Secondary | ICD-10-CM | POA: Diagnosis not present

## 2017-01-03 DIAGNOSIS — E871 Hypo-osmolality and hyponatremia: Secondary | ICD-10-CM | POA: Diagnosis not present

## 2017-01-03 DIAGNOSIS — R531 Weakness: Secondary | ICD-10-CM | POA: Insufficient documentation

## 2017-01-03 DIAGNOSIS — E1165 Type 2 diabetes mellitus with hyperglycemia: Secondary | ICD-10-CM | POA: Insufficient documentation

## 2017-01-03 LAB — CBC WITH DIFFERENTIAL/PLATELET
Basophils Absolute: 0.1 10*3/uL (ref 0–0.1)
Basophils Relative: 1 %
Eosinophils Absolute: 0.1 10*3/uL (ref 0–0.7)
Eosinophils Relative: 1 %
HEMATOCRIT: 41.4 % (ref 40.0–52.0)
Hemoglobin: 14.3 g/dL (ref 13.0–18.0)
LYMPHS ABS: 1.6 10*3/uL (ref 1.0–3.6)
LYMPHS PCT: 11 %
MCH: 30.7 pg (ref 26.0–34.0)
MCHC: 34.5 g/dL (ref 32.0–36.0)
MCV: 89 fL (ref 80.0–100.0)
MONO ABS: 0.9 10*3/uL (ref 0.2–1.0)
MONOS PCT: 6 %
NEUTROS ABS: 11.6 10*3/uL — AB (ref 1.4–6.5)
Neutrophils Relative %: 81 %
Platelets: 307 10*3/uL (ref 150–440)
RBC: 4.65 MIL/uL (ref 4.40–5.90)
RDW: 13.5 % (ref 11.5–14.5)
WBC: 14.3 10*3/uL — ABNORMAL HIGH (ref 3.8–10.6)

## 2017-01-03 LAB — COMPREHENSIVE METABOLIC PANEL
ALT: 23 U/L (ref 17–63)
ANION GAP: 15 (ref 5–15)
AST: 32 U/L (ref 15–41)
Albumin: 4.4 g/dL (ref 3.5–5.0)
Alkaline Phosphatase: 58 U/L (ref 38–126)
BILIRUBIN TOTAL: 0.8 mg/dL (ref 0.3–1.2)
BUN: 16 mg/dL (ref 6–20)
CALCIUM: 9.6 mg/dL (ref 8.9–10.3)
CO2: 21 mmol/L — ABNORMAL LOW (ref 22–32)
Chloride: 95 mmol/L — ABNORMAL LOW (ref 101–111)
Creatinine, Ser: 1.12 mg/dL (ref 0.61–1.24)
GFR calc Af Amer: >60 mL/min (ref >60)
Glucose, Bld: 301 mg/dL — ABNORMAL HIGH (ref 65–99)
Potassium: 3.7 mmol/L (ref 3.5–5.1)
Sodium: 131 mmol/L — ABNORMAL LOW (ref 135–145)
TOTAL PROTEIN: 7.8 g/dL (ref 6.5–8.1)

## 2017-01-03 LAB — GLUCOSE, CAPILLARY: GLUCOSE-CAPILLARY: 275 mg/dL — AB (ref 65–99)

## 2017-01-03 NOTE — ED Triage Notes (Signed)
Patient states he was unable to stand back up after going to the bathroom

## 2017-01-03 NOTE — ED Provider Notes (Signed)
[facility]    CSN: [id number] Arrival date & time: 01/03/17  1015     History   Chief Complaint Chief Complaint  Patient presents with  . Extremity Weakness    HPI Connor Aguilar is a 76 y.o. male.   76 yo male with a c/o weakness to both legs that started this morning. States he's had a little bit of diarrhea the last few days and this morning was out in the woods when he felt the urge to go. He squatted down in the woods, had a BM, then couldn't stand back up because his legs were weak. He called out for help and his friends came to help him up. He denies any vision changes, one-sided weakness, numbness/tingling, speech or swallowing problems.    The history is provided by the patient.    Past Medical History:  Diagnosis Date  . Allergy   . Diabetes mellitus without complication ([place])   . Hyperlipidemia   . Hypertension   . Tobacco abuse     Patient Active Problem List   Diagnosis Date Noted  . Viral URI with cough 07/07/2016  . Recurrent productive cough 01/28/2016  . Elevated CPK 11/18/2015  . Erectile dysfunction 08/19/2015  . Diabetes mellitus type 2, uncontrolled, without complications ([place]) 81/08/7508  . Pneumonia 07/27/2015  . Tobacco abuse 04/09/2015  . Simple chronic bronchitis ([place]) 04/03/2015  . Uncontrolled diabetes mellitus ([place]) 04/02/2015  . Hyperlipidemia 04/02/2015  . Essential hypertension 04/02/2015  . Panlobular emphysema ([place]) 04/02/2015    Past Surgical History:  Procedure Laterality Date  . FOOT SURGERY         Home Medications    Prior to Admission medications   Medication Sig Start Date End Date Taking? Authorizing Provider  irbesartan-hydrochlorothiazide (AVALIDE) 150-12.5 MG tablet Take 2 tablets by mouth daily. 05/26/16  Yes Roselee Nova, MD  metFORMIN (GLUCOPHAGE) 1000 MG tablet Take 1 tablet (1,000 mg total) by mouth 2 (two) times daily with a meal. 05/26/16  Yes Roselee Nova, MD  acetaminophen  (TYLENOL) 325 MG tablet Take 2 tablets (650 mg total) by mouth every 6 (six) hours as needed for mild pain (or Fever >/= 101). 07/30/15   Nicholes Mango, MD  amLODipine (NORVASC) 5 MG tablet TAKE ONE (1) TABLET EACH DAY 08/28/16   Roselee Nova, MD  azithromycin (ZITHROMAX) 250 MG tablet 2 tabs po day 1, then 1 tab po q day x 4 days 07/07/16   Roselee Nova, MD  cyclobenzaprine (FLEXERIL) 10 MG tablet Take 1 tablet (10 mg total) by mouth 3 (three) times daily as needed for muscle spasms. 06/24/16   Mortimer Fries, PA-C  cyclobenzaprine (FLEXERIL) 5 MG tablet TAKE ONE TABLET TWICE DAILY AS NEEDED FOR MUSCLE SPASMS 08/28/16   Roselee Nova, MD  HYDROcodone-acetaminophen (NORCO) 5-325 MG tablet Take 1 tablet by mouth every 4 (four) hours as needed for moderate pain. 06/24/16   Mortimer Fries, PA-C  ibuprofen (ADVIL,MOTRIN) 800 MG tablet Take 1 tablet (800 mg total) by mouth every 8 (eight) hours as needed. 06/24/16   Mortimer Fries, PA-C  mupirocin ointment (BACTROBAN) 2 % Place 1 application into the nose 2 (two) times daily. 2 times to lips for 10 days 08/05/15   Ashok Norris, MD  rosuvastatin (CRESTOR) 5 MG tablet TAKE ONE (1) TABLET AT BEDTIME 05/26/16   Roselee Nova, MD  sildenafil (REVATIO) 20 MG tablet Take 1 tablet (20 mg total) by  mouth daily. 08/19/15   Ashok Norris, MD  tiotropium (SPIRIVA HANDIHALER) 18 MCG inhalation capsule Place 1 capsule (18 mcg total) into inhaler and inhale once. 08/05/15   Ashok Norris, MD    Family History Family History  Problem Relation Age of Onset  . Diabetes Mother     Social History Social History  Substance Use Topics  . Smoking status: Current Every Day Smoker    Packs/day: 2.00    Types: Cigarettes  . Smokeless tobacco: Never Used  . Alcohol use No     Allergies   Patient has no known allergies.   Review of Systems Review of Systems   Physical Exam Triage Vital Signs ED Triage Vitals  Enc Vitals Group     BP  01/03/17 1024 (!) 155/59     Pulse Rate 01/03/17 1024 95     Resp 01/03/17 1024 18     Temp 01/03/17 1024 97.7 F (36.5 C)     Temp Source 01/03/17 1024 Oral     SpO2 01/03/17 1024 97 %     Weight 01/03/17 1023 170 lb (77.1 kg)     Height 01/03/17 1023 5\' 8"  (1.727 m)     Head Circumference --      Peak Flow --      Pain Score --      Pain Loc --      Pain Edu? --      Excl. in [place]? --    No data found.   Updated Vital Signs BP (!) 155/59   Pulse 95   Temp 97.7 F (36.5 C) (Oral)   Resp 18   Ht 5\' 8"  (1.727 m)   Wt 170 lb (77.1 kg)   SpO2 97%   BMI 25.85 kg/m   Visual Acuity Right Eye Distance:   Left Eye Distance:   Bilateral Distance:    Right Eye Near:   Left Eye Near:    Bilateral Near:     Physical Exam  Constitutional: He is oriented to person, place, and time. He appears well-developed and well-nourished. No distress.  HENT:  Head: Normocephalic and atraumatic.  Right Ear: Tympanic membrane, external ear and ear canal normal.  Left Ear: Tympanic membrane, external ear and ear canal normal.  Nose: Nose normal.  Mouth/Throat: Uvula is midline, oropharynx is clear and moist and mucous membranes are normal. No oropharyngeal exudate or tonsillar abscesses.  Eyes: Conjunctivae and EOM are normal. Pupils are equal, round, and reactive to light. Right eye exhibits no discharge. Left eye exhibits no discharge. No scleral icterus.  Neck: Normal range of motion. Neck supple. No tracheal deviation present. No thyromegaly present.  Cardiovascular: Normal rate, regular rhythm and normal heart sounds.   Pulmonary/Chest: Effort normal and breath sounds normal. No stridor. No respiratory distress. He has no wheezes. He has no rales. He exhibits no tenderness.  Abdominal: Soft. Bowel sounds are normal. He exhibits no distension and no mass. There is no tenderness. There is no rebound and no guarding.  Lymphadenopathy:    He has no cervical adenopathy.  Neurological: He is  alert and oriented to person, place, and time. He displays normal reflexes. No cranial nerve deficit or sensory deficit. He exhibits normal muscle tone. Coordination normal.  Skin: Skin is warm and dry. No rash noted. He is not diaphoretic.  Psychiatric: He has a normal mood and affect. His behavior is normal. Judgment and thought content normal.  Nursing note and vitals reviewed.    UC  Treatments / Results  Labs (all labs ordered are listed, but only abnormal results are displayed) Labs Reviewed  CBC WITH DIFFERENTIAL/PLATELET - Abnormal; Notable for the following:       Result Value   WBC 14.3 (*)    Neutro Abs 11.6 (*)    All other components within normal limits  COMPREHENSIVE METABOLIC PANEL - Abnormal; Notable for the following:    Sodium 131 (*)    Chloride 95 (*)    CO2 21 (*)    Glucose, Bld 301 (*)    All other components within normal limits  GLUCOSE, CAPILLARY - Abnormal; Notable for the following:    Glucose-Capillary 275 (*)    All other components within normal limits    EKG  EKG Interpretation None       Radiology No results found.  Procedures Procedures (including critical care time)  Medications Ordered in UC Medications - No data to display   Initial Impression / Assessment and Plan / UC Course  I have reviewed the triage vital signs and the nursing notes.  Pertinent labs & imaging results that were available during my care of the patient were reviewed by me and considered in my medical decision making (see chart for details).       Final Clinical Impressions(s) / UC Diagnoses   Final diagnoses:  Weakness of both legs  Hyponatremia  Diarrhea, unspecified type    New Prescriptions Discharge Medication List as of 01/03/2017 12:16 PM     1. Labs/x-ray results and diagnosis reviewed with patient 2. Recommend supportive treatment with increased fluids/hydration  3. Follow-up with PCP this week (and/or here prn)   Norval Gable,  MD 01/21/17 1031

## 2017-01-03 NOTE — Discharge Instructions (Signed)
Increase fluids Follow up with PCP  Go to Emergency Department if symptoms worsen

## 2017-02-01 ENCOUNTER — Encounter: Payer: Self-pay | Admitting: Family Medicine

## 2017-02-01 ENCOUNTER — Ambulatory Visit (INDEPENDENT_AMBULATORY_CARE_PROVIDER_SITE_OTHER): Payer: Medicare HMO | Admitting: Family Medicine

## 2017-02-01 ENCOUNTER — Other Ambulatory Visit: Payer: Self-pay | Admitting: Family Medicine

## 2017-02-01 VITALS — BP 139/78 | HR 75 | Temp 97.4°F | Resp 16 | Ht 68.0 in | Wt 175.9 lb

## 2017-02-01 DIAGNOSIS — E1165 Type 2 diabetes mellitus with hyperglycemia: Secondary | ICD-10-CM

## 2017-02-01 DIAGNOSIS — B351 Tinea unguium: Secondary | ICD-10-CM

## 2017-02-01 DIAGNOSIS — E785 Hyperlipidemia, unspecified: Secondary | ICD-10-CM | POA: Diagnosis not present

## 2017-02-01 DIAGNOSIS — E1169 Type 2 diabetes mellitus with other specified complication: Secondary | ICD-10-CM | POA: Diagnosis not present

## 2017-02-01 DIAGNOSIS — I10 Essential (primary) hypertension: Secondary | ICD-10-CM

## 2017-02-01 DIAGNOSIS — IMO0001 Reserved for inherently not codable concepts without codable children: Secondary | ICD-10-CM

## 2017-02-01 LAB — POCT GLYCOSYLATED HEMOGLOBIN (HGB A1C): HEMOGLOBIN A1C: 8.2

## 2017-02-01 MED ORDER — IRBESARTAN-HYDROCHLOROTHIAZIDE 150-12.5 MG PO TABS: 2.0000 | ORAL_TABLET | Freq: Every day | ORAL | 1 refills | Status: DC

## 2017-02-01 MED ORDER — AMLODIPINE BESYLATE 5 MG PO TABS: ORAL_TABLET | ORAL | 1 refills | Status: DC

## 2017-02-01 MED ORDER — ROSUVASTATIN CALCIUM 5 MG PO TABS: ORAL_TABLET | ORAL | 1 refills | Status: DC

## 2017-02-01 MED ORDER — SITAGLIPTIN PHOS-METFORMIN HCL 50-1000 MG PO TABS: 1.0000 | ORAL_TABLET | Freq: Two times a day (BID) | ORAL | 0 refills | Status: DC

## 2017-02-01 NOTE — Progress Notes (Signed)
Name: Connor Aguilar   MRN: [id number]    DOB: [birth year]   Date:02/01/2017       Progress Note  Subjective  Chief Complaint  Chief Complaint  Patient presents with  . Medication Refill    Diabetes  He presents for his follow-up diabetic visit. He has type 2 diabetes mellitus. His disease course has been worsening. There are no hypoglycemic associated symptoms. Pertinent negatives for hypoglycemia include no headaches. Pertinent negatives for diabetes include no blurred vision, no chest pain, no fatigue, no foot paresthesias, no polydipsia and no polyuria. Risk factors for coronary artery disease include diabetes mellitus and obesity. Current diabetic treatment includes oral agent (monotherapy). He is following a diabetic and generally healthy diet. He participates in exercise daily. Frequency home blood tests: does not check Blood Glucose every day. An ACE inhibitor/angiotensin II receptor blocker is being taken.  Hypertension  This is a chronic problem. The problem is unchanged. The problem is controlled. Pertinent negatives include no blurred vision, chest pain, headaches, palpitations or shortness of breath. Past treatments include angiotensin blockers, diuretics and calcium channel blockers.  Hyperlipidemia  This is a chronic problem. The problem is uncontrolled. Recent lipid tests were reviewed and are high. Pertinent negatives include no chest pain, leg pain, myalgias or shortness of breath. Current antihyperlipidemic treatment includes statins.     Past Medical History:  Diagnosis Date  . Allergy   . Diabetes mellitus without complication ([place])   . Hyperlipidemia   . Hypertension   . Tobacco abuse     Past Surgical History:  Procedure Laterality Date  . FOOT SURGERY      Family History  Problem Relation Age of Onset  . Diabetes Mother     Social History   Social History  . Marital status: Married    Spouse name: N/A  . Number of children: N/A  . Years of education:  N/A   Occupational History  . Not on file.   Social History Main Topics  . Smoking status: Current Every Day Smoker    Packs/day: 2.00    Types: Cigarettes  . Smokeless tobacco: Never Used  . Alcohol use No  . Drug use: No  . Sexual activity: No   Other Topics Concern  . Not on file   Social History Narrative  . No narrative on file     Current Outpatient Prescriptions:  .  acetaminophen (TYLENOL) 325 MG tablet, Take 2 tablets (650 mg total) by mouth every 6 (six) hours as needed for mild pain (or Fever >/= 101)., Disp: , Rfl:  .  amLODipine (NORVASC) 5 MG tablet, TAKE ONE (1) TABLET EACH DAY, Disp: 90 tablet, Rfl: 1 .  cyclobenzaprine (FLEXERIL) 10 MG tablet, Take 1 tablet (10 mg total) by mouth 3 (three) times daily as needed for muscle spasms., Disp: 21 tablet, Rfl: 0 .  cyclobenzaprine (FLEXERIL) 5 MG tablet, TAKE ONE TABLET TWICE DAILY AS NEEDED FOR MUSCLE SPASMS, Disp: 60 tablet, Rfl: 2 .  HYDROcodone-acetaminophen (NORCO) 5-325 MG tablet, Take 1 tablet by mouth every 4 (four) hours as needed for moderate pain., Disp: 6 tablet, Rfl: 0 .  ibuprofen (ADVIL,MOTRIN) 800 MG tablet, Take 1 tablet (800 mg total) by mouth every 8 (eight) hours as needed., Disp: 12 tablet, Rfl: 0 .  irbesartan-hydrochlorothiazide (AVALIDE) 150-12.5 MG tablet, Take 2 tablets by mouth daily., Disp: 180 tablet, Rfl: 1 .  metFORMIN (GLUCOPHAGE) 1000 MG tablet, Take 1 tablet (1,000 mg total) by mouth 2 (two) times daily  with a meal., Disp: 180 tablet, Rfl: 1 .  mupirocin ointment (BACTROBAN) 2 %, Place 1 application into the nose 2 (two) times daily. 2 times to lips for 10 days, Disp: 22 g, Rfl: 0 .  rosuvastatin (CRESTOR) 5 MG tablet, TAKE ONE (1) TABLET AT BEDTIME, Disp: 90 tablet, Rfl: 1 .  sildenafil (REVATIO) 20 MG tablet, Take 1 tablet (20 mg total) by mouth daily., Disp: 30 tablet, Rfl: 1 .  azithromycin (ZITHROMAX) 250 MG tablet, 2 tabs po day 1, then 1 tab po q day x 4 days (Patient not taking:  Reported on 02/01/2017), Disp: 6 tablet, Rfl: 0 .  tiotropium (SPIRIVA HANDIHALER) 18 MCG inhalation capsule, Place 1 capsule (18 mcg total) into inhaler and inhale once. (Patient not taking: Reported on 02/01/2017), Disp: 30 capsule, Rfl: 12  No Known Allergies   Review of Systems  Constitutional: Negative for fatigue.  Eyes: Negative for blurred vision.  Respiratory: Negative for shortness of breath.   Cardiovascular: Negative for chest pain and palpitations.  Musculoskeletal: Negative for myalgias.  Neurological: Negative for headaches.  Endo/Heme/Allergies: Negative for polydipsia.      Objective  Vitals:   02/01/17 1419  BP: 139/78  Pulse: 75  Resp: 16  Temp: 97.4 F (36.3 C)  TempSrc: Oral  SpO2: 96%  Weight: 175 lb 14.4 oz (79.8 kg)  Height: 5\' 8"  (1.727 m)    Physical Exam  Constitutional: He is oriented to person, place, and time and well-developed, well-nourished, and in no distress.  HENT:  Head: Normocephalic and atraumatic.  Cardiovascular: Normal rate, regular rhythm and normal heart sounds.   No murmur heard. Pulmonary/Chest: Effort normal and breath sounds normal. He has no wheezes.  Abdominal: Soft. Bowel sounds are normal. There is no tenderness.  Musculoskeletal: He exhibits no edema.  Neurological: He is alert and oriented to person, place, and time. GCS score is 15.  Psychiatric: Mood, memory, affect and judgment normal.  Nursing note and vitals reviewed.   Assessment & Plan  1. Uncontrolled type 2 diabetes mellitus without complication, without long-term current use of insulin (HCC) Point-of-care A1c is 8.2%, poorly controlled diabetes, will add Januvia 100 mg daily to patient's diabetes regimen. - POCT HgB A1C - sitaGLIPtin-metformin (JANUMET) 50-1000 MG tablet; Take 1 tablet by mouth 2 (two) times daily with a meal.  Dispense: 180 tablet; Refill: 0 - Urine Microalbumin w/creat. ratio  2. Essential hypertension BP stable on present  antihypertensive therapy - irbesartan-hydrochlorothiazide (AVALIDE) 150-12.5 MG tablet; Take 2 tablets by mouth daily.  Dispense: 180 tablet; Refill: 1 - amLODipine (NORVASC) 5 MG tablet; TAKE ONE (1) TABLET EACH DAY  Dispense: 90 tablet; Refill: 1  3. Hyperlipidemia, unspecified hyperlipidemia type  - rosuvastatin (CRESTOR) 5 MG tablet; TAKE ONE (1) TABLET AT BEDTIME  Dispense: 90 tablet; Refill: 1 - COMPLETE METABOLIC PANEL WITH GFR - Lipid panel  4. Onychomycosis of multiple toenails with type 2 diabetes mellitus ([place])  - Ambulatory referral to [facility]. [facility] 02/01/2017 2:39 PM

## 2017-02-11 ENCOUNTER — Telehealth: Payer: Self-pay | Admitting: Family Medicine

## 2017-02-11 NOTE — Telephone Encounter (Signed)
PT SAID THAT THE DR PLACED HIM ON METFORMIN WITH ANOTHER DRUG AND HE GOT IT FILLED BUT SAYS THAT HE CAN NOT AFFORD TO FILL IT AGAIN. IT COST HIM 500.00 A MONTH. IF HE GOT A 90 DAY SUPPLY IT WAS 1500.00 AND HE HAS NO INSURANCE FOR MEDICATION AND HAS TO PAY FOR IT HIMSELF. NEEDS TO GIVE HIM SOMETHING AND HE WILL BE OUT OF THE MEDICATION AT THE FIRST OF THE WEEK.

## 2017-02-11 NOTE — Telephone Encounter (Signed)
Please advise 

## 2017-02-15 NOTE — Telephone Encounter (Signed)
I called patient; Dr. Manuella Ghazi will be back Wednesday He has enough medicine to last until he comes back He just can't afford to pay $500 a month; he hopes Dr. Manuella Ghazi can change him to something else

## 2017-02-17 NOTE — Telephone Encounter (Signed)
Patient was started on Janumet 50-1000 milligrams 1 tablet by mouth twice a day, this drug costs him $500 a month out of pocket. We can change it to Januvia 100 mg daily along with metformin 1000 mg twice a day, and he will take 2 separate medications instead of these 2 drugs combined into 1 Janumet. Please confirm with patient and send a prescription for Januvia 100 mg by mouth daily #90 #0 refills and metformin 1000 mg by mouth twice a day #180 #0 refills to his pharmacy.

## 2017-02-18 MED ORDER — METFORMIN HCL 1000 MG PO TABS: 1000.0000 mg | ORAL_TABLET | Freq: Two times a day (BID) | ORAL | 0 refills | Status: DC

## 2017-02-18 MED ORDER — SITAGLIPTIN PHOSPHATE 100 MG PO TABS: 100.0000 mg | ORAL_TABLET | Freq: Every day | ORAL | 0 refills | Status: DC

## 2017-02-18 NOTE — Telephone Encounter (Signed)
Medication has been refilled and sent [employer]

## 2017-03-14 ENCOUNTER — Ambulatory Visit: Payer: Self-pay | Admitting: Podiatry

## 2017-05-04 ENCOUNTER — Other Ambulatory Visit: Payer: Self-pay | Admitting: Family Medicine

## 2017-05-10 ENCOUNTER — Other Ambulatory Visit: Payer: Self-pay

## 2017-05-10 DIAGNOSIS — N528 Other male erectile dysfunction: Secondary | ICD-10-CM

## 2017-05-10 NOTE — Telephone Encounter (Signed)
Refill request was sent to Dr. Syed Asad A. Shah for approval and submission.  

## 2017-05-14 ENCOUNTER — Other Ambulatory Visit: Payer: Self-pay | Admitting: Family Medicine

## 2017-05-14 DIAGNOSIS — E785 Hyperlipidemia, unspecified: Secondary | ICD-10-CM

## 2017-05-14 DIAGNOSIS — N528 Other male erectile dysfunction: Secondary | ICD-10-CM

## 2017-05-18 ENCOUNTER — Telehealth: Payer: Self-pay | Admitting: Family Medicine

## 2017-05-18 ENCOUNTER — Other Ambulatory Visit: Payer: Self-pay | Admitting: Family Medicine

## 2017-05-18 DIAGNOSIS — E1165 Type 2 diabetes mellitus with hyperglycemia: Principal | ICD-10-CM

## 2017-05-18 DIAGNOSIS — IMO0001 Reserved for inherently not codable concepts without codable children: Secondary | ICD-10-CM

## 2017-05-18 MED ORDER — METFORMIN HCL 1000 MG PO TABS: 1000.0000 mg | ORAL_TABLET | Freq: Two times a day (BID) | ORAL | 0 refills | Status: DC

## 2017-05-18 NOTE — Telephone Encounter (Signed)
PT ALREADY HAS APPT 05-19-17.

## 2017-05-18 NOTE — Telephone Encounter (Signed)
PT SAID THAT PHARM HAS SENT REQUEST A WEEK AGO TO GET REFILL ON HIS MEDS. HE IS NEEIDNG METFORMIN ( HE IS OUT) AND HE NEEDS AVALIDE AND ANY OTHERS THAT HE WILL SOON BE OUT OF IN THE NEXT WEEK OR SO. 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

## 2017-05-18 NOTE — Telephone Encounter (Signed)
Patient needs office visit appointment for diabetes follow-up and labs before any refills can be provided, please schedule

## 2017-05-18 NOTE — Telephone Encounter (Signed)
Prescription for metformin has been sent to patient's pharmacy. Thank you

## 2017-05-19 ENCOUNTER — Encounter: Payer: Self-pay | Admitting: Family Medicine

## 2017-05-19 ENCOUNTER — Ambulatory Visit (INDEPENDENT_AMBULATORY_CARE_PROVIDER_SITE_OTHER): Payer: Medicare HMO | Admitting: Family Medicine

## 2017-05-19 VITALS — BP 142/76 | HR 83 | Temp 98.0°F | Resp 18 | Ht 68.0 in | Wt 172.4 lb

## 2017-05-19 DIAGNOSIS — B351 Tinea unguium: Secondary | ICD-10-CM | POA: Diagnosis not present

## 2017-05-19 DIAGNOSIS — I10 Essential (primary) hypertension: Secondary | ICD-10-CM

## 2017-05-19 DIAGNOSIS — E785 Hyperlipidemia, unspecified: Secondary | ICD-10-CM

## 2017-05-19 DIAGNOSIS — E1165 Type 2 diabetes mellitus with hyperglycemia: Secondary | ICD-10-CM

## 2017-05-19 DIAGNOSIS — N528 Other male erectile dysfunction: Secondary | ICD-10-CM

## 2017-05-19 DIAGNOSIS — IMO0001 Reserved for inherently not codable concepts without codable children: Secondary | ICD-10-CM

## 2017-05-19 LAB — GLUCOSE, POCT (MANUAL RESULT ENTRY): POC Glucose: 237 mg/dl — AB (ref 70–99)

## 2017-05-19 LAB — POCT GLYCOSYLATED HEMOGLOBIN (HGB A1C): Hemoglobin A1C: 7.8

## 2017-05-19 MED ORDER — IRBESARTAN-HYDROCHLOROTHIAZIDE 150-12.5 MG PO TABS: 2.0000 | ORAL_TABLET | Freq: Every day | ORAL | 1 refills | Status: DC

## 2017-05-19 MED ORDER — ROSUVASTATIN CALCIUM 5 MG PO TABS: ORAL_TABLET | ORAL | 1 refills | Status: DC

## 2017-05-19 MED ORDER — TADALAFIL 5 MG PO TABS: 5.0000 mg | ORAL_TABLET | Freq: Every day | ORAL | 0 refills | Status: DC | PRN

## 2017-05-19 MED ORDER — AMLODIPINE BESYLATE 5 MG PO TABS: ORAL_TABLET | ORAL | 1 refills | Status: DC

## 2017-05-19 NOTE — Progress Notes (Signed)
Name: Connor Aguilar   MRN: [id number]    DOB: [birth year]   Date:05/19/2017       Progress Note  Subjective  Chief Complaint  Chief Complaint  Patient presents with  . Diabetes    follow up, medication refills  . Hypertension    Diabetes  He presents for his follow-up diabetic visit. He has type 2 diabetes mellitus. His disease course has been worsening. There are no hypoglycemic associated symptoms. Pertinent negatives for hypoglycemia include no headaches. Pertinent negatives for diabetes include no blurred vision, no chest pain, no fatigue, no foot paresthesias, no polydipsia and no polyuria. Risk factors for coronary artery disease include diabetes mellitus and obesity. Current diabetic treatment includes oral agent (monotherapy). He is following a diabetic and generally healthy diet. He participates in exercise daily. Frequency home blood tests: does not check Blood Glucose every day. An ACE inhibitor/angiotensin II receptor blocker is being taken.  Hypertension  This is a chronic problem. The problem is unchanged. The problem is controlled. Pertinent negatives include no blurred vision, chest pain, headaches, palpitations or shortness of breath. Past treatments include angiotensin blockers, diuretics and calcium channel blockers.  Hyperlipidemia  This is a chronic problem. The problem is uncontrolled. Recent lipid tests were reviewed and are high. Pertinent negatives include no chest pain, leg pain, myalgias or shortness of breath. Current antihyperlipidemic treatment includes statins.  Erectile Dysfunction  This is a recurrent problem. The problem is unchanged. The nature of his difficulty is achieving erection and maintaining erection. Irritative symptoms do not include urgency. Obstructive symptoms do not include dribbling, incomplete emptying, straining or a weak stream. Past treatments include tadalafil (has tried Cialis in the past.).    Past Medical History:  Diagnosis Date  .  Allergy   . Diabetes mellitus without complication ([place])   . Hyperlipidemia   . Hypertension   . Tobacco abuse     Past Surgical History:  Procedure Laterality Date  . FOOT SURGERY      Family History  Problem Relation Age of Onset  . Diabetes Mother     Social History   Social History  . Marital status: Married    Spouse name: N/A  . Number of children: N/A  . Years of education: N/A   Occupational History  . Not on file.   Social History Main Topics  . Smoking status: Current Every Day Smoker    Packs/day: 2.00    Types: Cigarettes  . Smokeless tobacco: Never Used  . Alcohol use No  . Drug use: No  . Sexual activity: No   Other Topics Concern  . Not on file   Social History Narrative  . No narrative on file     Current Outpatient Prescriptions:  .  acetaminophen (TYLENOL) 325 MG tablet, Take 2 tablets (650 mg total) by mouth every 6 (six) hours as needed for mild pain (or Fever >/= 101)., Disp: , Rfl:  .  amLODipine (NORVASC) 5 MG tablet, TAKE ONE (1) TABLET EACH DAY, Disp: 90 tablet, Rfl: 1 .  ibuprofen (ADVIL,MOTRIN) 800 MG tablet, Take 1 tablet (800 mg total) by mouth every 8 (eight) hours as needed., Disp: 12 tablet, Rfl: 0 .  irbesartan-hydrochlorothiazide (AVALIDE) 150-12.5 MG tablet, Take 2 tablets by mouth daily., Disp: 180 tablet, Rfl: 1 .  metFORMIN (GLUCOPHAGE) 1000 MG tablet, Take 1 tablet (1,000 mg total) by mouth 2 (two) times daily with a meal., Disp: 180 tablet, Rfl: 0 .  rosuvastatin (CRESTOR) 5 MG tablet, TAKE  ONE (1) TABLET AT BEDTIME, Disp: 90 tablet, Rfl: 1 .  sildenafil (REVATIO) 20 MG tablet, Take 1 tablet (20 mg total) by mouth daily., Disp: 30 tablet, Rfl: 1 .  cyclobenzaprine (FLEXERIL) 10 MG tablet, Take 1 tablet (10 mg total) by mouth 3 (three) times daily as needed for muscle spasms. (Patient not taking: Reported on 05/19/2017), Disp: 21 tablet, Rfl: 0 .  cyclobenzaprine (FLEXERIL) 5 MG tablet, TAKE ONE TABLET TWICE DAILY AS NEEDED  FOR MUSCLE SPASMS (Patient not taking: Reported on 05/19/2017), Disp: 60 tablet, Rfl: 2 .  HYDROcodone-acetaminophen (NORCO) 5-325 MG tablet, Take 1 tablet by mouth every 4 (four) hours as needed for moderate pain. (Patient not taking: Reported on 05/19/2017), Disp: 6 tablet, Rfl: 0 .  mupirocin ointment (BACTROBAN) 2 %, Place 1 application into the nose 2 (two) times daily. 2 times to lips for 10 days (Patient not taking: Reported on 05/19/2017), Disp: 22 g, Rfl: 0 .  sitaGLIPtin (JANUVIA) 100 MG tablet, Take 1 tablet (100 mg total) by mouth daily. (Patient not taking: Reported on 05/19/2017), Disp: 90 tablet, Rfl: 0 .  sitaGLIPtin-metformin (JANUMET) 50-1000 MG tablet, Take 1 tablet by mouth 2 (two) times daily with a meal., Disp: 180 tablet, Rfl: 0  No Known Allergies   Review of Systems  Constitutional: Negative for fatigue.  Eyes: Negative for blurred vision.  Respiratory: Negative for shortness of breath.   Cardiovascular: Negative for chest pain and palpitations.  Genitourinary: Negative for incomplete emptying and urgency.  Musculoskeletal: Negative for myalgias.  Neurological: Negative for headaches.  Endo/Heme/Allergies: Negative for polydipsia.     Objective  Vitals:   05/19/17 1039  BP: (!) 142/76  Pulse: 83  Resp: 18  Temp: 98 F (36.7 C)  TempSrc: Oral  SpO2: 96%  Weight: 172 lb 6.4 oz (78.2 kg)  Height: 5\' 8"  (1.727 m)    Physical Exam  Constitutional: He is oriented to person, place, and time and well-developed, well-nourished, and in no distress.  HENT:  Head: Normocephalic and atraumatic.  Cardiovascular: Normal rate, regular rhythm and normal heart sounds.   No murmur heard. Pulmonary/Chest: Effort normal and breath sounds normal. He has no wheezes.  Abdominal: Soft. Bowel sounds are normal. There is no tenderness.  Musculoskeletal: He exhibits no edema.  Dystrophic thickened toe nails c/w onychomycosis  Neurological: He is alert and oriented to  person, place, and time.  Psychiatric: Mood, memory, affect and judgment normal.  Nursing note and vitals reviewed.     Recent Results (from the past 2160 hour(s))  POCT Glucose (CBG)     Status: Abnormal   Collection Time: 05/19/17 10:46 AM  Result Value Ref Range   POC Glucose 237 (A) 70 - 99 mg/dl  POCT HgB A1C     Status: None   Collection Time: 05/19/17 10:47 AM  Result Value Ref Range   Hemoglobin A1C 7.8      Assessment & Plan  1. Diabetes mellitus type 2, uncontrolled, without complications (HCC)  Point-of-care A1c 7.8%, poorly controlled diabetes pertinent educated on dietary lifestyle changes, would leave pharmacotherapy unchanged for now, reassess in 3 months- POCT HgB A1C - POCT Glucose (CBG) - Urine Microalbumin w/creat. ratio  2. Essential hypertension  BP stable on present antihypertensive crit - amLODipine (NORVASC) 5 MG tablet; TAKE ONE (1) TABLET EACH DAY  Dispense: 90 tablet; Refill: 1 - irbesartan-hydrochlorothiazide (AVALIDE) 150-12.5 MG tablet; Take 2 tablets by mouth daily.  Dispense: 180 tablet; Refill: 1  3. Hyperlipidemia, unspecified hyperlipidemia  type On statin, obtain FLP and just if appropriate - rosuvastatin (CRESTOR) 5 MG tablet; TAKE ONE (1) TABLET AT BEDTIME  Dispense: 90 tablet; Refill: 1 - Lipid panel - COMPLETE METABOLIC PANEL WITH GFR  4. Onychomycosis of toenail  referral to podiatry for management - Ambulatory referral to Podiatry  5. Other male erectile dysfunction  - sildenafil (REVATIO) 20 MG tablet; Take 1 tablet (20 mg total) by mouth as needed.  Dispense: 10 tablet; Refill: 0   Madden Piazza [facility] 05/19/2017 10:55 AM

## 2017-05-20 MED ORDER — SILDENAFIL CITRATE 20 MG PO TABS: 20.0000 mg | ORAL_TABLET | ORAL | 0 refills | Status: DC | PRN

## 2017-06-18 ENCOUNTER — Emergency Department
Admission: EM | Admit: 2017-06-18 | Discharge: 2017-06-18 | Disposition: A | Discharge status: Home / Self Care | Payer: Medicare HMO | Attending: Emergency Medicine | Admitting: Emergency Medicine

## 2017-06-18 ENCOUNTER — Encounter: Payer: Self-pay | Admitting: *Deleted

## 2017-06-18 ENCOUNTER — Other Ambulatory Visit: Payer: Self-pay

## 2017-06-18 ENCOUNTER — Emergency Department: Payer: Medicare HMO

## 2017-06-18 DIAGNOSIS — I10 Essential (primary) hypertension: Secondary | ICD-10-CM | POA: Diagnosis not present

## 2017-06-18 DIAGNOSIS — F17210 Nicotine dependence, cigarettes, uncomplicated: Secondary | ICD-10-CM | POA: Insufficient documentation

## 2017-06-18 DIAGNOSIS — Y9389 Activity, other specified: Secondary | ICD-10-CM | POA: Insufficient documentation

## 2017-06-18 DIAGNOSIS — Z7984 Long term (current) use of oral hypoglycemic drugs: Secondary | ICD-10-CM | POA: Diagnosis not present

## 2017-06-18 DIAGNOSIS — Z79899 Other long term (current) drug therapy: Secondary | ICD-10-CM | POA: Diagnosis not present

## 2017-06-18 DIAGNOSIS — S20212A Contusion of left front wall of thorax, initial encounter: Secondary | ICD-10-CM

## 2017-06-18 DIAGNOSIS — Y92009 Unspecified place in unspecified non-institutional (private) residence as the place of occurrence of the external cause: Secondary | ICD-10-CM | POA: Insufficient documentation

## 2017-06-18 DIAGNOSIS — Y999 Unspecified external cause status: Secondary | ICD-10-CM | POA: Diagnosis not present

## 2017-06-18 DIAGNOSIS — W01198A Fall on same level from slipping, tripping and stumbling with subsequent striking against other object, initial encounter: Secondary | ICD-10-CM | POA: Insufficient documentation

## 2017-06-18 DIAGNOSIS — R69 Illness, unspecified: Secondary | ICD-10-CM | POA: Diagnosis not present

## 2017-06-18 DIAGNOSIS — S299XXA Unspecified injury of thorax, initial encounter: Secondary | ICD-10-CM | POA: Diagnosis present

## 2017-06-18 DIAGNOSIS — R0781 Pleurodynia: Secondary | ICD-10-CM | POA: Diagnosis not present

## 2017-06-18 DIAGNOSIS — W19XXXA Unspecified fall, initial encounter: Secondary | ICD-10-CM

## 2017-06-18 MED ORDER — CYCLOBENZAPRINE HCL 10 MG PO TABS
5.0000 mg | ORAL_TABLET | Freq: Once | ORAL | Status: AC
  Administered 2017-06-18: 5 mg via ORAL
  Filled 2017-06-18: qty 1

## 2017-06-18 MED ORDER — CYCLOBENZAPRINE HCL 5 MG PO TABS: 5.0000 mg | ORAL_TABLET | Freq: Three times a day (TID) | ORAL | 0 refills | Status: DC | PRN

## 2017-06-18 MED ORDER — NAPROXEN 500 MG PO TABS
500.0000 mg | ORAL_TABLET | Freq: Once | ORAL | Status: AC
  Administered 2017-06-18: 500 mg via ORAL
  Filled 2017-06-18: qty 1

## 2017-06-18 NOTE — ED Notes (Signed)
Patient was carrying a radio up some steps today and the cord got wrapped up in his feet causing him to trip up the steps and land on the radio. Patient with a small bruised area to the sternum at the xiphoid process. Patient states it happened this morning and has become increasingly sore. Patient was afraid if he didn't come and get checked out "something bad might happen." Denies striking his head or LOC. States no other injury.

## 2017-06-18 NOTE — ED Triage Notes (Signed)
Pt to ED reporting he was carrying a radio down the stairs when he tripped on the cord and fell on top of the radio. Pt reports the radio struck him in the center of his chest and now he is sore when he coughs and breaths. Pt denies SOB. No heart hx. Pt denies having hit head and denies LOC. NO pain reported upon palpation of ribs.

## 2017-06-18 NOTE — ED Provider Notes (Signed)
[facility] Emergency Department Provider Note ____________________________________________  Time seen: 2101  I have reviewed the triage vital signs and the nursing notes.  HISTORY  Chief Complaint  Fall and Chest Injury  HPI Connor Aguilar is a 76 y.o. male presents to the ED accompanied by family, for evaluation of acute anterior chest wall pain.  Patient describes he was walking while carrying a small portable speaker.  He apparently tripped over the cord, and fell on top of the radial, he describes the speaker landed on the step, and he landed on top of the speaker.  He presents now with pain primarily to his left anterior chest.  He reports pain is increased with attempts to take a deep breath.  He denies any shortness of breath, hemoptysis, or wheezing.  He also denies any distal paresthesia, nausea, or head injury.  Past Medical History:  Diagnosis Date  . Allergy   . Diabetes mellitus without complication ([place])   . Hyperlipidemia   . Hypertension   . Tobacco abuse     Patient Active Problem List   Diagnosis Date Noted  . Viral URI with cough 07/07/2016  . Recurrent productive cough 01/28/2016  . Elevated CPK 11/18/2015  . Erectile dysfunction 08/19/2015  . Diabetes mellitus type 2, uncontrolled, without complications ([place]) 41/66/0630  . Pneumonia 07/27/2015  . Tobacco abuse 04/09/2015  . Simple chronic bronchitis ([place]) 04/03/2015  . Uncontrolled diabetes mellitus ([place]) 04/02/2015  . Hyperlipidemia 04/02/2015  . Essential hypertension 04/02/2015  . Panlobular emphysema ([place]) 04/02/2015    Past Surgical History:  Procedure Laterality Date  . FOOT SURGERY      Prior to Admission medications   Medication Sig Start Date End Date Taking? Authorizing Provider  acetaminophen (TYLENOL) 325 MG tablet Take 2 tablets (650 mg total) by mouth every 6 (six) hours as needed for mild pain (or Fever >/= 101). 07/30/15   Nicholes Mango, MD  amLODipine  (NORVASC) 5 MG tablet TAKE ONE (1) TABLET EACH DAY 05/19/17   Roselee Nova, MD  cyclobenzaprine (FLEXERIL) 5 MG tablet Take 1 tablet (5 mg total) 3 (three) times daily as needed by mouth for muscle spasms. 06/18/17   Oluwatomisin Deman, Dannielle Karvonen, PA-C  irbesartan-hydrochlorothiazide (AVALIDE) 150-12.5 MG tablet Take 2 tablets by mouth daily. 05/19/17   Roselee Nova, MD  metFORMIN (GLUCOPHAGE) 1000 MG tablet Take 1 tablet (1,000 mg total) by mouth 2 (two) times daily with a meal. 05/18/17   Roselee Nova, MD  rosuvastatin (CRESTOR) 5 MG tablet TAKE ONE (1) TABLET AT BEDTIME 05/19/17   Roselee Nova, MD  sildenafil (REVATIO) 20 MG tablet Take 1 tablet (20 mg total) by mouth as needed. 05/20/17   Roselee Nova, MD    Allergies Patient has no known allergies.  Family History  Problem Relation Age of Onset  . Diabetes Mother     Social History Social History   Tobacco Use  . Smoking status: Current Every Day Smoker    Packs/day: 2.00    Types: Cigarettes  . Smokeless tobacco: Never Used  Substance Use Topics  . Alcohol use: No    Alcohol/week: 0.0 oz  . Drug use: No    Review of Systems  Constitutional: Negative for fever. Eyes: Negative for visual changes. ENT: Negative for sore throat. Cardiovascular: Negative for chest pain. Respiratory: Negative for shortness of breath. Gastrointestinal: Negative for abdominal pain, vomiting and diarrhea. Genitourinary: Negative for dysuria. Musculoskeletal: Negative for  back pain. Skin: Negative for rash. Neurological: Negative for headaches, focal weakness or numbness. ____________________________________________  PHYSICAL EXAM:  VITAL SIGNS: ED Triage Vitals  Enc Vitals Group     BP 06/18/17 1910 137/63     Pulse Rate 06/18/17 1910 65     Resp 06/18/17 1910 18     Temp 06/18/17 1910 98.1 F (36.7 C)     Temp Source 06/18/17 1910 Oral     SpO2 06/18/17 1910 96 %     Weight 06/18/17 1907 172 lb (78 kg)      Height 06/18/17 1907 5\' 8"  (1.727 m)     Head Circumference --      Peak Flow --      Pain Score 06/18/17 1907 5     Pain Loc --      Pain Edu? --      Excl. in [place]? --     Constitutional: Alert and oriented. Well appearing and in no distress. Head: Normocephalic and atraumatic. Eyes: Conjunctivae are normal. Normal extraocular movements Neck: Supple. No thyromegaly. Cardiovascular: Normal rate, regular rhythm. Normal distal pulses. Respiratory: Normal respiratory effort. No wheezes/rales/rhonchi.  Left pectoralis muscle with early superficial ecchymosis noted.  No chest wall deformity, flail chest, or hematomas appreciated. Gastrointestinal: Soft and nontender. No distention. Musculoskeletal: Nontender with normal range of motion in all extremities.  Neurologic:  Normal gait without ataxia. Normal speech and language. No gross focal neurologic deficits are appreciated. Skin:  Skin is warm, dry and intact. No rash noted. ____________________________________________  EKG  NSR 70 bpm Normal axis No STEMI ____________________________________________   RADIOLOGY  Left Rib Detail/CXR  IMPRESSION: No displaced rib fracture seen. No acute cardiopulmonary process identified. ____________________________________________  PROCEDURES  Flexeril 5 mg PO Naproxen 500 mg PO ____________________________________________  INITIAL IMPRESSION / ASSESSMENT AND PLAN / ED COURSE  Geriatric patient with ED evaluation of acute chest wall pain following mechanical fall.  He sustained a contusion to the anterior left chest after falling on a speaker.  His x-ray is negative for any acute fracture or cardiopulmonary process.  Exam is consistent with soft tissue injury and muscle strain.  He will be discharged with a prescription for Flexeril to dose as needed.  He will follow-up with the primary care provider return to the ED as needed. ____________________________________________  FINAL CLINICAL  IMPRESSION(S) / ED DIAGNOSES  Final diagnoses:  Fall in home, initial encounter  Chest wall contusion, left, initial encounter      Melvenia Needles, PA-C 06/18/17 2149    Rudene Re, MD 06/18/17 [phone]

## 2017-06-18 NOTE — Discharge Instructions (Signed)
Your exam and x-ray show a chest bruise without any rib fractures. You lungs are also clear. Take OTC Tylenol or Motrin for pain relief. Take the muscle relaxant as needed. Apply ice compresses or moist heat to help with the pain. See Dr. Manuella Ghazi as needed.

## 2017-06-23 ENCOUNTER — Ambulatory Visit: Payer: Medicare HMO | Admitting: Family Medicine

## 2017-06-28 ENCOUNTER — Encounter: Payer: Self-pay | Admitting: Family Medicine

## 2017-06-28 ENCOUNTER — Ambulatory Visit: Payer: Self-pay

## 2017-06-28 ENCOUNTER — Ambulatory Visit (INDEPENDENT_AMBULATORY_CARE_PROVIDER_SITE_OTHER): Payer: Medicare HMO | Admitting: Family Medicine

## 2017-06-28 VITALS — BP 130/82 | HR 85 | Temp 98.5°F | Resp 18 | Ht 68.0 in | Wt 170.5 lb

## 2017-06-28 DIAGNOSIS — S20212D Contusion of left front wall of thorax, subsequent encounter: Secondary | ICD-10-CM | POA: Diagnosis not present

## 2017-06-28 DIAGNOSIS — Y92009 Unspecified place in unspecified non-institutional (private) residence as the place of occurrence of the external cause: Secondary | ICD-10-CM | POA: Diagnosis not present

## 2017-06-28 DIAGNOSIS — W19XXXD Unspecified fall, subsequent encounter: Secondary | ICD-10-CM

## 2017-06-28 MED ORDER — NAPROXEN 500 MG PO TBEC: 500.0000 mg | DELAYED_RELEASE_TABLET | Freq: Two times a day (BID) | ORAL | 0 refills | Status: DC

## 2017-06-28 MED ORDER — LIDOCAINE 5 % EX PTCH
1.0000 | MEDICATED_PATCH | CUTANEOUS | 0 refills | Status: DC
Start: 2017-06-28 — End: 2017-07-11

## 2017-06-28 NOTE — Patient Instructions (Addendum)
Fall Prevention in the Home Falls can cause injuries. They can happen to people of all ages. There are many things you can do to make your home safe and to help prevent falls. What can I do on the outside of my home?  Regularly fix the edges of walkways and driveways and fix any cracks.  Remove anything that might make you trip as you walk through a door, such as a raised step or threshold.  Trim any bushes or trees on the path to your home.  Use bright outdoor lighting.  Clear any walking paths of anything that might make someone trip, such as rocks or tools.  Regularly check to see if handrails are loose or broken. Make sure that both sides of any steps have handrails.  Any raised decks and porches should have guardrails on the edges.  Have any leaves, snow, or ice cleared regularly.  Use sand or salt on walking paths during winter.  Clean up any spills in your garage right away. This includes oil or grease spills. What can I do in the bathroom?  Use night lights.  Install grab bars by the toilet and in the tub and shower. Do not use towel bars as grab bars.  Use non-skid mats or decals in the tub or shower.  If you need to sit down in the shower, use a plastic, non-slip stool.  Keep the floor dry. Clean up any water that spills on the floor as soon as it happens.  Remove soap buildup in the tub or shower regularly.  Attach bath mats securely with double-sided non-slip rug tape.  Do not have throw rugs and other things on the floor that can make you trip. What can I do in the bedroom?  Use night lights.  Make sure that you have a light by your bed that is easy to reach.  Do not use any sheets or blankets that are too big for your bed. They should not hang down onto the floor.  Have a firm chair that has side arms. You can use this for support while you get dressed.  Do not have throw rugs and other things on the floor that can make you trip. What can I do in the  kitchen?  Clean up any spills right away.  Avoid walking on wet floors.  Keep items that you use a lot in easy-to-reach places.  If you need to reach something above you, use a strong step stool that has a grab bar.  Keep electrical cords out of the way.  Do not use floor polish or wax that makes floors slippery. If you must use wax, use non-skid floor wax.  Do not have throw rugs and other things on the floor that can make you trip. What can I do with my stairs?  Do not leave any items on the stairs.  Make sure that there are handrails on both sides of the stairs and use them. Fix handrails that are broken or loose. Make sure that handrails are as long as the stairways.  Check any carpeting to make sure that it is firmly attached to the stairs. Fix any carpet that is loose or worn.  Avoid having throw rugs at the top or bottom of the stairs. If you do have throw rugs, attach them to the floor with carpet tape.  Make sure that you have a light switch at the top of the stairs and the bottom of the stairs. If you do   not have them, ask someone to add them for you. What else can I do to help prevent falls?  Wear shoes that: ? Do not have high heels. ? Have rubber bottoms. ? Are comfortable and fit you well. ? Are closed at the toe. Do not wear sandals.  If you use a stepladder: ? Make sure that it is fully opened. Do not climb a closed stepladder. ? Make sure that both sides of the stepladder are locked into place. ? Ask someone to hold it for you, if possible.  Clearly mark and make sure that you can see: ? Any grab bars or handrails. ? First and last steps. ? Where the edge of each step is.  Use tools that help you move around (mobility aids) if they are needed. These include: ? Canes. ? Walkers. ? Scooters. ? Crutches.  Turn on the lights when you go into a dark area. Replace any light bulbs as soon as they burn out.  Set up your furniture so you have a clear path.  Avoid moving your furniture around.  If any of your floors are uneven, fix them.  If there are any pets around you, be aware of where they are.  Review your medicines with your doctor. Some medicines can make you feel dizzy. This can increase your chance of falling. Ask your doctor what other things that you can do to help prevent falls. This information is not intended to replace advice given to you by your health care provider. Make sure you discuss any questions you have with your health care provider. Document Released: 05/22/2009 Document Revised: 01/01/2016 Document Reviewed: 08/30/2014 Elsevier Interactive Patient Education  2018 [place].  Chest Wall Pain Chest wall pain is pain in or around the bones and muscles of your chest. Sometimes, an injury causes this pain. Sometimes, the cause may not be known. This pain may take several weeks or longer to get better. Follow these instructions at home: Pay attention to any changes in your symptoms. Take these actions to help with your pain:  Rest as told by your doctor.  Avoid activities that cause pain. Try not to use your chest, belly (abdominal), or side muscles to lift heavy things.  If directed, apply ice to the painful area: ? Put ice in a plastic bag. ? Place a towel between your skin and the bag. ? Leave the ice on for 20 minutes, 2-3 times per day.  Take over-the-counter and prescription medicines only as told by your doctor.  Do not use tobacco products, including cigarettes, chewing tobacco, and e-cigarettes. If you need help quitting, ask your doctor.  Keep all follow-up visits as told by your doctor. This is important.  Contact a doctor if:  You have a fever.  Your chest pain gets worse.  You have new symptoms. Get help right away if:  You feel sick to your stomach (nauseous) or you throw up (vomit).  You feel sweaty or light-headed.  You have a cough with phlegm (sputum) or you cough up blood.  You  are short of breath. This information is not intended to replace advice given to you by your health care provider. Make sure you discuss any questions you have with your health care provider. Document Released: 01/12/2008 Document Revised: 01/01/2016 Document Reviewed: 10/21/2014 Elsevier Interactive Patient Education  [employer].

## 2017-06-28 NOTE — Progress Notes (Signed)
Name: Connor Aguilar   MRN: [id number]    DOB: 01-04-1941   Date:06/28/2017       Progress Note  Subjective  Chief Complaint  Chief Complaint  Patient presents with  . Fall    seen in ER, Bruised, hurting in chest for 2 weeks. Painful when coughing    HPI  Patient presents for ER follow up - fell forward on steps while carrying a large radio.  Pain has remained in the center of his chest since his visit. Chest Xray - LEFT 3-view showed no rib fracture or Cardiopulmonary issues. He was given Flexeril, and he says this did not help his pain at all, so he has not been taking it.  He has been taking Tylenol, but this doesn't stop the pain completely.  He is hoping to gain pain relief today in some way.   Denies shortness of breath or difficulty catching his breath.  CMP from 01/03/2017 shows Adequate kidney function with GFR of 77.  Patient Active Problem List   Diagnosis Date Noted  . Viral URI with cough 07/07/2016  . Recurrent productive cough 01/28/2016  . Elevated CPK 11/18/2015  . Erectile dysfunction 08/19/2015  . Diabetes mellitus type 2, uncontrolled, without complications ([place]) 37/85/8850  . Pneumonia 07/27/2015  . Tobacco abuse 04/09/2015  . Simple chronic bronchitis ([place]) 04/03/2015  . Uncontrolled diabetes mellitus ([place]) 04/02/2015  . Hyperlipidemia 04/02/2015  . Essential hypertension 04/02/2015  . Panlobular emphysema ([place]) 04/02/2015    Social History   Tobacco Use  . Smoking status: Current Every Day Smoker    Packs/day: 2.00    Types: Cigarettes  . Smokeless tobacco: Never Used  Substance Use Topics  . Alcohol use: No    Alcohol/week: 0.0 oz     Current Outpatient Medications:  .  acetaminophen (TYLENOL) 325 MG tablet, Take 2 tablets (650 mg total) by mouth every 6 (six) hours as needed for mild pain (or Fever >/= 101)., Disp: , Rfl:  .  amLODipine (NORVASC) 5 MG tablet, TAKE ONE (1) TABLET EACH DAY, Disp: 90 tablet, Rfl: 1 .   irbesartan-hydrochlorothiazide (AVALIDE) 150-12.5 MG tablet, Take 2 tablets by mouth daily., Disp: 180 tablet, Rfl: 1 .  metFORMIN (GLUCOPHAGE) 1000 MG tablet, Take 1 tablet (1,000 mg total) by mouth 2 (two) times daily with a meal., Disp: 180 tablet, Rfl: 0 .  rosuvastatin (CRESTOR) 5 MG tablet, TAKE ONE (1) TABLET AT BEDTIME, Disp: 90 tablet, Rfl: 1 .  sildenafil (REVATIO) 20 MG tablet, Take 1 tablet (20 mg total) by mouth as needed., Disp: 10 tablet, Rfl: 0 .  cyclobenzaprine (FLEXERIL) 5 MG tablet, Take 1 tablet (5 mg total) 3 (three) times daily as needed by mouth for muscle spasms. (Patient not taking: Reported on 06/28/2017), Disp: 15 tablet, Rfl: 0  No Known Allergies  ROS  Constitutional: Negative for fever or weight change.  Respiratory: Negative for cough and shortness of breath.   Cardiovascular: Positive for chest wall pain. Negative for palpitations.  Gastrointestinal: Negative for abdominal pain, no bowel changes.  Musculoskeletal: Negative for gait problem or joint swelling.  Skin: Negative for rash.  Neurological: Negative for dizziness or headache.  No other specific complaints in a complete review of systems (except as listed in HPI above).  Objective  Vitals:   06/28/17 1500  BP: 130/82  Pulse: 85  Resp: 18  Temp: 98.5 F (36.9 C)  TempSrc: Oral  SpO2: 95%  Weight: 170 lb 8 oz (77.3 kg)  Height: 5'  8" (1.727 m)   Body mass index is 25.92 kg/m.  Nursing Note and Vital Signs reviewed.  Physical Exam  Constitutional: Patient appears well-developed and well-nourished. ObeseNo distress.  HEENT: head atraumatic, normocephalic Cardiovascular: Normal rate, regular rhythm, S1/S2 present.  No murmur or rub heard. No BLE edema. LEFT chest wall has reproducible tenderness, no crepitus palpated, equal rise and fall of chest on observation. Pulmonary/Chest: Effort normal and breath sounds clear in all fields. No respiratory distress or retractions. Psychiatric:  Patient has a normal mood and affect. behavior is normal. Judgment and thought content normal. Skin: Central-left chest exhibits yellowed skin that appears to be ecchymosis in later stages of healing.  Recent Results (from the past 2160 hour(s))  POCT Glucose (CBG)     Status: Abnormal   Collection Time: 05/19/17 10:46 AM  Result Value Ref Range   POC Glucose 237 (A) 70 - 99 mg/dl  POCT HgB A1C     Status: None   Collection Time: 05/19/17 10:47 AM  Result Value Ref Range   Hemoglobin A1C 7.8      Assessment & Plan 1. Chest wall contusion, left, subsequent encounter - naproxen (EC NAPROSYN) 500 MG EC tablet; Take 1 tablet (500 mg total) by mouth 2 (two) times daily with a meal.  Dispense: 20 tablet; Refill: 0 - lidocaine (LIDODERM) 5 %; Place 1 patch onto the skin daily. Remove & Discard patch within 12 hours of applying, leave off for 12 hours, then apply new patch  Dispense: 10 patch; Refill: 0  2. Fall in home, subsequent encounter Fall precautions discussed with patient including not carrying heavy items up the stairs, good lighting, rails in bathroom and on stairs, removing lose throw rugs.  -Red flags and when to present for emergency care or RTC including fever >101.89F, chest pain that is different than current pain, shortness of breath, new/worsening/un-resolving symptoms, reviewed with patient at time of visit. Follow up and care instructions discussed and provided in AVS.

## 2017-06-28 NOTE — Telephone Encounter (Signed)
  Reason for Disposition . [1] High-risk adult (e.g., age > 63, osteoporosis, chronic steroid use) AND [2] still hurts  Answer Assessment - Initial Assessment Questions 1. MECHANISM: "How did the injury happen?"     Tripped over a cord and fell - hit chest. 2 weeks ago 2. ONSET: "When did the injury happen?" (Minutes or hours ago)     2 weeks ago 3. LOCATION: "Where on the chest is the injury located?"     Sternum and hurts under each 4. APPEARANCE: "What does the injury look like?"     Some bruising 5. BLEEDING: "Is there any bleeding now? If so, ask: How long has it been bleeding?"     None 6. SEVERITY: "Any difficulty with breathing?"     No 7. SIZE: For cuts, bruises, or swelling, ask: "How large is it?" (e.g., inches or centimeters)     N/A 8. PAIN: "Is there pain?" If so, ask: "How bad is the pain?"   (e.g., Scale 1-10; or mild, moderate, severe)     With coughing 9-10 9. TETANUS: For any breaks in the skin, ask: "When was the last tetanus booster?"     No 10. PREGNANCY: "Is there any chance you are pregnant?" "When was your last menstrual period?"       No  Protocols used: CHEST INJURY-A-AH Medication from ED didn't help.

## 2017-07-11 ENCOUNTER — Ambulatory Visit
Admission: RE | Admit: 2017-07-11 | Discharge: 2017-07-11 | Disposition: A | Discharge status: Home / Self Care | Payer: Medicare HMO | Source: Ambulatory Visit | Attending: Family Medicine | Admitting: Family Medicine

## 2017-07-11 ENCOUNTER — Ambulatory Visit (INDEPENDENT_AMBULATORY_CARE_PROVIDER_SITE_OTHER): Payer: Medicare HMO | Admitting: Family Medicine

## 2017-07-11 ENCOUNTER — Encounter: Payer: Self-pay | Admitting: Family Medicine

## 2017-07-11 ENCOUNTER — Other Ambulatory Visit: Payer: Self-pay | Admitting: Family Medicine

## 2017-07-11 VITALS — BP 118/58 | HR 71 | Temp 97.7°F | Ht 68.0 in | Wt 168.9 lb

## 2017-07-11 DIAGNOSIS — R0789 Other chest pain: Secondary | ICD-10-CM | POA: Insufficient documentation

## 2017-07-11 DIAGNOSIS — S299XXD Unspecified injury of thorax, subsequent encounter: Secondary | ICD-10-CM

## 2017-07-11 DIAGNOSIS — X58XXXD Exposure to other specified factors, subsequent encounter: Secondary | ICD-10-CM | POA: Insufficient documentation

## 2017-07-11 DIAGNOSIS — R0781 Pleurodynia: Secondary | ICD-10-CM | POA: Diagnosis not present

## 2017-07-11 DIAGNOSIS — S299XXA Unspecified injury of thorax, initial encounter: Secondary | ICD-10-CM | POA: Diagnosis not present

## 2017-07-11 MED ORDER — MELOXICAM 7.5 MG PO TABS: 7.5000 mg | ORAL_TABLET | Freq: Every day | ORAL | 0 refills | Status: DC

## 2017-07-11 NOTE — Progress Notes (Signed)
Name: Connor Aguilar   MRN: [id number]    DOB: [birth year]   Date:07/11/2017       Progress Note  Subjective  Chief Complaint  Chief Complaint  Patient presents with  . Follow-up    Pt states he is still having the chest pain and tightness, and still has cough, pt states it is not worse but not better    HPI  Patient presents to follow up on chest pain and contusion s/p falling forward on steps while carrying a large radio. Chest Xray on 06/18/2017- LEFT 3-view showed no rib fracture or Cardiopulmonary issues. He was given Flexeril, and he says this did not help his pain at all he was then given Lidocaine patches and naproxen at visit on 06/28/17 which helped only minimally.  He has been taking Tylenol, took Naproxen without relief.  Denies shortness of breath or difficulty catching his breath.  Has chronic cough, and notes pain with cough has not improved either.  CMP from 01/03/2017 shows Adequate kidney function with GFR of 77.  Has not applied ice or heat to the area; he trims trees daily for work, and has not taken any time off.  Smokes 1ppd.  Patient Active Problem List   Diagnosis Date Noted  . Viral URI with cough 07/07/2016  . Recurrent productive cough 01/28/2016  . Elevated CPK 11/18/2015  . Erectile dysfunction 08/19/2015  . Diabetes mellitus type 2, uncontrolled, without complications ([place]) 32/44/0102  . Pneumonia 07/27/2015  . Tobacco abuse 04/09/2015  . Simple chronic bronchitis ([place]) 04/03/2015  . Uncontrolled diabetes mellitus ([place]) 04/02/2015  . Hyperlipidemia 04/02/2015  . Essential hypertension 04/02/2015  . Panlobular emphysema ([place]) 04/02/2015    Social History   Tobacco Use  . Smoking status: Current Every Day Smoker    Packs/day: 2.00    Types: Cigarettes  . Smokeless tobacco: Never Used  Substance Use Topics  . Alcohol use: No    Alcohol/week: 0.0 oz     Current Outpatient Medications:  .  acetaminophen (TYLENOL) 325 MG tablet, Take 2 tablets (650  mg total) by mouth every 6 (six) hours as needed for mild pain (or Fever >/= 101)., Disp: , Rfl:  .  amLODipine (NORVASC) 5 MG tablet, TAKE ONE (1) TABLET EACH DAY, Disp: 90 tablet, Rfl: 1 .  irbesartan-hydrochlorothiazide (AVALIDE) 150-12.5 MG tablet, Take 2 tablets by mouth daily., Disp: 180 tablet, Rfl: 1 .  lidocaine (LIDODERM) 5 %, Place 1 patch onto the skin daily. Remove & Discard patch within 12 hours of applying, leave off for 12 hours, then apply new patch, Disp: 10 patch, Rfl: 0 .  metFORMIN (GLUCOPHAGE) 1000 MG tablet, Take 1 tablet (1,000 mg total) by mouth 2 (two) times daily with a meal., Disp: 180 tablet, Rfl: 0 .  naproxen (EC NAPROSYN) 500 MG EC tablet, Take 1 tablet (500 mg total) by mouth 2 (two) times daily with a meal., Disp: 20 tablet, Rfl: 0 .  rosuvastatin (CRESTOR) 5 MG tablet, TAKE ONE (1) TABLET AT BEDTIME, Disp: 90 tablet, Rfl: 1 .  sildenafil (REVATIO) 20 MG tablet, Take 1 tablet (20 mg total) by mouth as needed., Disp: 10 tablet, Rfl: 0 .  cyclobenzaprine (FLEXERIL) 5 MG tablet, Take 1 tablet (5 mg total) 3 (three) times daily as needed by mouth for muscle spasms. (Patient not taking: Reported on 06/28/2017), Disp: 15 tablet, Rfl: 0  No Known Allergies  ROS  Constitutional: Negative for fever or weight change.  Respiratory: Negative for cough and shortness  of breath.   Cardiovascular: See HPI; negative for palpitations.  Gastrointestinal: Negative for abdominal pain, no bowel changes.  Musculoskeletal: Negative for gait problem or joint swelling.  Skin: Negative for rash.  Neurological: Negative for dizziness or headache.  No other specific complaints in a complete review of systems (except as listed in HPI above).  Objective  Vitals:   07/11/17 1003  BP: (!) 118/58  Pulse: 71  Temp: 97.7 F (36.5 C)  TempSrc: Oral  SpO2: 93%  Weight: 168 lb 14.4 oz (76.6 kg)  Height: 5\' 8"  (1.727 m)   Body mass index is 25.68 kg/m.  Nursing Note and Vital Signs  reviewed.  Physical Exam  Pulmonary/Chest:      Constitutional: Patient appears well-developed and well-nourished.  No distress.  HEENT: head atraumatic, normocephalic Cardiovascular: Normal rate, regular rhythm, S1/S2 present.  No murmur or rub heard. No BLE edema. Mid-sternum and bilateral chest wall have reproducible tenderness in areas as above, no crepitus palpated, equal rise and fall of chest on observation. Pulmonary/Chest: Effort normal and breath sounds clear. No respiratory distress or retractions. Psychiatric: Patient has a normal mood and affect. behavior is normal. Judgment and thought content normal. Skin: No ecchymosis, no rash  Recent Results (from the past 2160 hour(s))  POCT Glucose (CBG)     Status: Abnormal   Collection Time: 05/19/17 10:46 AM  Result Value Ref Range   POC Glucose 237 (A) 70 - 99 mg/dl  POCT HgB A1C     Status: None   Collection Time: 05/19/17 10:47 AM  Result Value Ref Range   Hemoglobin A1C 7.8      Assessment & Plan  1. Chest wall injury, subsequent encounter - DG Ribs Bilateral W/Chest; Future - meloxicam (MOBIC) 7.5 MG tablet; Take 1 tablet (7.5 mg total) by mouth daily.  Dispense: 15 tablet; Refill: 0 - Splint chest when coughing; apply heat PRN, rest from job for a few days. -Red flags and when to present for emergency care or RTC including fever >101.53F, chest pain that is different from current pain, shortness of breath, new/worsening/un-resolving symptoms, reviewed with patient at time of visit. Follow up and care instructions discussed and provided in AVS.

## 2017-07-11 NOTE — Patient Instructions (Addendum)
Please go to the Outpatient imaging center for Xray.  Chest Wall Pain Chest wall pain is pain in or around the bones and muscles of your chest. Sometimes, an injury causes this pain. Sometimes, the cause may not be known. This pain may take several weeks or longer to get better. Follow these instructions at home: Pay attention to any changes in your symptoms. Take these actions to help with your pain:  Rest as told by your doctor.  Avoid activities that cause pain. Try not to use your chest, belly (abdominal), or side muscles to lift heavy things.  If directed, apply ice to the painful area: ? Put ice in a plastic bag. ? Place a towel between your skin and the bag. ? Leave the ice on for 20 minutes, 2-3 times per day.  Take over-the-counter and prescription medicines only as told by your doctor.  Do not use tobacco products, including cigarettes, chewing tobacco, and e-cigarettes. If you need help quitting, ask your doctor.  Keep all follow-up visits as told by your doctor. This is important.  Contact a doctor if:  You have a fever.  Your chest pain gets worse.  You have new symptoms. Get help right away if:  You feel sick to your stomach (nauseous) or you throw up (vomit).  You feel sweaty or light-headed.  You have a cough with phlegm (sputum) or you cough up blood.  You are short of breath. This information is not intended to replace advice given to you by your health care provider. Make sure you discuss any questions you have with your health care provider. Document Released: 01/12/2008 Document Revised: 01/01/2016 Document Reviewed: 10/21/2014 Elsevier Interactive Patient Education  [employer].

## 2017-07-25 ENCOUNTER — Other Ambulatory Visit: Payer: Self-pay | Admitting: Family Medicine

## 2017-07-25 DIAGNOSIS — S299XXD Unspecified injury of thorax, subsequent encounter: Secondary | ICD-10-CM

## 2017-07-25 NOTE — Telephone Encounter (Signed)
Copied from [place] [id number]. Topic: Quick Communication - See Telephone Encounter >> Jul 25, 2017 11:12 AM Connor Aguilar, NT wrote: CRM for notification. See Telephone encounter for:  07/25/17.  Patient states he would like to get a refill on meloxicam. States they are working really well for him.  [facility]

## 2017-07-25 NOTE — Telephone Encounter (Signed)
Patient states the medication is working and really he;ping for the pain. Pt is aware that he needs to come in for fasting labs. Gave pt our lab hours and he understood he will need to come in fasting.

## 2017-07-25 NOTE — Telephone Encounter (Signed)
Please call and ask how pt is feeling - is the Meloxicam helping his pain? Please also remind him he has labs that he needs to come in and have performed - these were ordered back in October 2018. If Meloxicam is helping his pain,  I can provide a refill pending the results of his labs (need to monitor kidney function).  Thanks!

## 2017-07-28 ENCOUNTER — Other Ambulatory Visit: Payer: Self-pay | Admitting: Family Medicine

## 2017-07-28 DIAGNOSIS — S299XXD Unspecified injury of thorax, subsequent encounter: Secondary | ICD-10-CM

## 2017-07-28 MED ORDER — MELOXICAM 7.5 MG PO TABS: 7.5000 mg | ORAL_TABLET | Freq: Every day | ORAL | 0 refills | Status: DC

## 2017-07-28 NOTE — Telephone Encounter (Signed)
Rx for Meloxicam 7.5 mg has been sent to patient's pharmacy.

## 2017-08-08 ENCOUNTER — Telehealth: Payer: Self-pay | Admitting: Family Medicine

## 2017-08-08 DIAGNOSIS — E1165 Type 2 diabetes mellitus with hyperglycemia: Principal | ICD-10-CM

## 2017-08-08 DIAGNOSIS — IMO0001 Reserved for inherently not codable concepts without codable children: Secondary | ICD-10-CM

## 2017-08-08 NOTE — Telephone Encounter (Signed)
Pt requesting refill of Metformin until appt on 08/30/16.

## 2017-08-08 NOTE — Telephone Encounter (Signed)
Copied from [place] [id number]. Topic: Quick Communication - Rx Refill/Question >> Aug 08, 2017  2:03 PM Yvette Rack wrote: Has the patient contacted their pharmacy? No.   (Agent: If no, request that the patient contact the pharmacy for the refill.)metFORMIN (GLUCOPHAGE) 1000 MG tablet until appointment on 08-30-16   Preferred Pharmacy (with phone number or street name): [place], [place] [facility] [place] [phone] (Phone) [phone] (Fax)     Agent: Please be advised that RX refills may take up to 3 business days. We ask that you follow-up with your pharmacy.

## 2017-08-10 ENCOUNTER — Ambulatory Visit: Payer: Medicare HMO | Admitting: Family Medicine

## 2017-08-10 MED ORDER — METFORMIN HCL 1000 MG PO TABS: 1000.0000 mg | ORAL_TABLET | Freq: Two times a day (BID) | ORAL | 0 refills | Status: DC

## 2017-08-10 NOTE — Telephone Encounter (Signed)
Approved an emergency supply until pt see Dr. Manuella Ghazi on 08/30/2017. Pt will have to come to the visit in order to receive 90 day supply.

## 2017-08-15 ENCOUNTER — Ambulatory Visit: Payer: Medicare HMO | Admitting: Family Medicine

## 2017-08-19 ENCOUNTER — Ambulatory Visit: Payer: Medicare HMO | Admitting: Family Medicine

## 2017-08-19 ENCOUNTER — Ambulatory Visit: Payer: Medicare HMO

## 2017-08-30 ENCOUNTER — Ambulatory Visit: Payer: Medicare HMO | Admitting: Family Medicine

## 2017-08-30 ENCOUNTER — Ambulatory Visit: Payer: Medicare HMO

## 2017-08-31 ENCOUNTER — Ambulatory Visit (INDEPENDENT_AMBULATORY_CARE_PROVIDER_SITE_OTHER): Payer: Medicare HMO | Admitting: Family Medicine

## 2017-08-31 ENCOUNTER — Encounter: Payer: Self-pay | Admitting: Family Medicine

## 2017-08-31 VITALS — HR 84 | Wt 167.1 lb

## 2017-08-31 DIAGNOSIS — E782 Mixed hyperlipidemia: Secondary | ICD-10-CM

## 2017-08-31 DIAGNOSIS — E08641 Diabetes mellitus due to underlying condition with hypoglycemia with coma: Secondary | ICD-10-CM

## 2017-08-31 DIAGNOSIS — I10 Essential (primary) hypertension: Secondary | ICD-10-CM

## 2017-08-31 LAB — COMPLETE METABOLIC PANEL WITH GFR
AG Ratio: 1.8 (calc) (ref 1.0–2.5)
ALT: 16 U/L (ref 9–46)
AST: 13 U/L (ref 10–35)
Albumin: 4.7 g/dL (ref 3.6–5.1)
Alkaline phosphatase (APISO): 53 U/L (ref 40–115)
BUN: 15 mg/dL (ref 7–25)
CO2: 28 mmol/L (ref 20–32)
Calcium: 10.1 mg/dL (ref 8.6–10.3)
Chloride: 96 mmol/L — ABNORMAL LOW (ref 98–110)
Creat: 0.97 mg/dL (ref 0.70–1.18)
GFR, Est African American: 86 mL/min/{1.73_m2} (ref >=60)
GFR, Est Non African American: 74 mL/min/{1.73_m2} (ref >=60)
Globulin: 2.6 g/dL (calc) (ref 1.9–3.7)
Glucose, Bld: 157 mg/dL — ABNORMAL HIGH (ref 65–99)
Potassium: 4.6 mmol/L (ref 3.5–5.3)
Sodium: 136 mmol/L (ref 135–146)
Total Bilirubin: 1 mg/dL (ref 0.2–1.2)
Total Protein: 7.3 g/dL (ref 6.1–8.1)

## 2017-08-31 LAB — GLUCOSE, POCT (MANUAL RESULT ENTRY): POC GLUCOSE: 147 mg/dL — AB (ref 70–99)

## 2017-08-31 LAB — LIPID PANEL
CHOLESTEROL: 96 mg/dL (ref <200)
HDL: 49 mg/dL (ref >40)
LDL CHOLESTEROL (CALC): 22 mg/dL
Non-HDL Cholesterol (Calc): 47 mg/dL (calc) (ref <130)
Total CHOL/HDL Ratio: 2 (calc) (ref <5.0)
Triglycerides: 170 mg/dL — ABNORMAL HIGH (ref <150)

## 2017-08-31 LAB — POCT GLYCOSYLATED HEMOGLOBIN (HGB A1C): HEMOGLOBIN A1C: 7.4

## 2017-08-31 MED ORDER — IRBESARTAN-HYDROCHLOROTHIAZIDE 150-12.5 MG PO TABS: 1.0000 | ORAL_TABLET | Freq: Every day | ORAL | 1 refills | Status: DC

## 2017-08-31 MED ORDER — ROSUVASTATIN CALCIUM 5 MG PO TABS: ORAL_TABLET | ORAL | 1 refills | Status: DC

## 2017-08-31 MED ORDER — METFORMIN HCL 1000 MG PO TABS: 1000.0000 mg | ORAL_TABLET | Freq: Two times a day (BID) | ORAL | 0 refills | Status: DC

## 2017-08-31 MED ORDER — AMLODIPINE BESYLATE 5 MG PO TABS: ORAL_TABLET | ORAL | 1 refills | Status: DC

## 2017-08-31 NOTE — Progress Notes (Signed)
Name: Connor Aguilar   MRN: [id number]    DOB: March 16, 1941   Date:08/31/2017       Progress Note  Subjective  Chief Complaint  Chief Complaint  Patient presents with  . Hyperlipidemia  . Hypertension    dizzie once awhile; more in the mornings  . Diabetes  . Medication Refill    Hyperlipidemia  This is a chronic problem. The problem is uncontrolled. Recent lipid tests were reviewed and are high (has not been able to check his cholesterol in over a year.). Pertinent negatives include no chest pain, leg pain, myalgias or shortness of breath. Current antihyperlipidemic treatment includes statins. Risk factors for coronary artery disease include diabetes mellitus and dyslipidemia.  Hypertension  This is a chronic problem. The problem is unchanged. The problem is controlled. Pertinent negatives include no blurred vision, chest pain, headaches, palpitations or shortness of breath. Past treatments include angiotensin blockers, diuretics and calcium channel blockers. There is no history of kidney disease or CAD/MI.  Diabetes  He presents for his follow-up diabetic visit. He has type 2 diabetes mellitus. His disease course has been worsening. There are no hypoglycemic associated symptoms. Pertinent negatives for hypoglycemia include no headaches. Pertinent negatives for diabetes include no blurred vision, no chest pain, no fatigue, no foot paresthesias, no polydipsia and no polyuria. Risk factors for coronary artery disease include diabetes mellitus and obesity. Current diabetic treatment includes oral agent (monotherapy). He is following a diabetic and generally healthy diet. He participates in exercise daily. Frequency home blood tests: does not check Blood Glucose every day. An ACE inhibitor/angiotensin II receptor blocker is being taken.    Past Medical History:  Diagnosis Date  . Allergy   . Diabetes mellitus without complication (Connor Aguilar)   . Hyperlipidemia   . Hypertension   . Tobacco abuse      Past Surgical History:  Procedure Laterality Date  . FOOT SURGERY      Family History  Problem Relation Age of Onset  . Diabetes Mother     Social History   Socioeconomic History  . Marital status: Married    Spouse name: Not on file  . Number of children: Not on file  . Years of education: Not on file  . Highest education level: Not on file  Social Needs  . Financial resource strain: Not on file  . Food insecurity - worry: Not on file  . Food insecurity - inability: Not on file  . Transportation needs - medical: Not on file  . Transportation needs - non-medical: Not on file  Occupational History  . Not on file  Tobacco Use  . Smoking status: Current Every Day Smoker    Packs/day: 2.00    Types: Cigarettes  . Smokeless tobacco: Never Used  Substance and Sexual Activity  . Alcohol use: No    Alcohol/week: 0.0 oz  . Drug use: No  . Sexual activity: No  Other Topics Concern  . Not on file  Social History Narrative  . Not on file     Current Outpatient Medications:  .  acetaminophen (TYLENOL) 325 MG tablet, Take 2 tablets (650 mg total) by mouth every 6 (six) hours as needed for mild pain (or Fever >/= 101)., Disp: , Rfl:  .  amLODipine (NORVASC) 5 MG tablet, TAKE ONE (1) TABLET EACH DAY, Disp: 90 tablet, Rfl: 1 .  irbesartan-hydrochlorothiazide (AVALIDE) 150-12.5 MG tablet, Take 2 tablets by mouth daily., Disp: 180 tablet, Rfl: 1 .  metFORMIN (GLUCOPHAGE) 1000 MG tablet,  Take 1 tablet (1,000 mg total) by mouth 2 (two) times daily with a meal., Disp: 36 tablet, Rfl: 0 .  rosuvastatin (CRESTOR) 5 MG tablet, TAKE ONE (1) TABLET AT BEDTIME, Disp: 90 tablet, Rfl: 1 .  meloxicam (MOBIC) 7.5 MG tablet, Take 1 tablet (7.5 mg total) by mouth daily. (Patient not taking: Reported on 08/31/2017), Disp: 15 tablet, Rfl: 0 .  sildenafil (REVATIO) 20 MG tablet, Take 1 tablet (20 mg total) by mouth as needed. (Patient not taking: Reported on 08/31/2017), Disp: 10 tablet, Rfl:  0  No Known Allergies   Review of Systems  Constitutional: Negative for fatigue.  Eyes: Negative for blurred vision.  Respiratory: Negative for shortness of breath.   Cardiovascular: Negative for chest pain and palpitations.  Musculoskeletal: Negative for myalgias.  Neurological: Negative for headaches.  Endo/Heme/Allergies: Negative for polydipsia.     Objective  Vitals:   08/31/17 0904  Pulse: 84  SpO2: 93%  Weight: 167 lb 1.6 oz (75.8 kg)    Physical Exam  Constitutional: He is oriented to person, place, and time and well-developed, well-nourished, and in no distress.  HENT:  Head: Normocephalic and atraumatic.  Right Ear: External ear normal.  Left Ear: External ear normal.  Cardiovascular: Normal rate, regular rhythm and normal heart sounds.  No murmur heard. Pulmonary/Chest: Effort normal and breath sounds normal. He has no wheezes.  Abdominal: Soft. Bowel sounds are normal. There is no tenderness.  Musculoskeletal: He exhibits no edema.  Neurological: He is alert and oriented to person, place, and time. GCS score is 15.  Psychiatric: Mood, memory, affect and judgment normal.  Nursing note and vitals reviewed.    Recent Results (from the past 2160 hour(s))  POCT Glucose (CBG)     Status: Abnormal   Collection Time: 08/31/17  9:18 AM  Result Value Ref Range   POC Glucose 147 (A) 70 - 99 mg/dl     Assessment & Plan  1. Diabetes mellitus due to underlying condition, uncontrolled, with hypoglycemia and coma (HCC) A1c of 7.4%, well-controlled diabetes, no change in treatment - POCT HgB A1C - POCT Glucose (CBG) - metFORMIN (GLUCOPHAGE) 1000 MG tablet; Take 1 tablet (1,000 mg total) by mouth 2 (two) times daily with a meal.  Dispense: 180 tablet; Refill: 0  2. Essential hypertension BP stable on present antihypertensive treatment. - irbesartan-hydrochlorothiazide (AVALIDE) 150-12.5 MG tablet; Take 1 tablet by mouth daily.  Dispense: 90 tablet; Refill: 1 -  amLODipine (NORVASC) 5 MG tablet; TAKE ONE (1) TABLET EACH DAY  Dispense: 90 tablet; Refill: 1  3. Mixed hyperlipidemia Obtain FLP, continue on statin, adjust as appropriate - rosuvastatin (CRESTOR) 5 MG tablet; TAKE ONE (1) TABLET AT BEDTIME  Dispense: 90 tablet; Refill: 1 - Lipid panel - COMPLETE METABOLIC PANEL WITH GFR   Tene Gato [facility] 08/31/2017 9:19 AM

## 2017-09-06 ENCOUNTER — Telehealth: Payer: Self-pay

## 2017-09-06 ENCOUNTER — Ambulatory Visit: Payer: Medicare HMO

## 2017-09-06 NOTE — Telephone Encounter (Signed)
Pt NS'd for AWV w/ NHA on 09/06/17. Called to follow up re: NS for appt and to attempt to reschedule appt. LVM requesting returned call.

## 2017-11-29 ENCOUNTER — Ambulatory Visit: Payer: Medicare HMO | Admitting: Family Medicine

## 2017-12-13 ENCOUNTER — Ambulatory Visit (INDEPENDENT_AMBULATORY_CARE_PROVIDER_SITE_OTHER): Payer: Medicare HMO | Admitting: Family Medicine

## 2017-12-13 ENCOUNTER — Encounter: Payer: Self-pay | Admitting: Family Medicine

## 2017-12-13 VITALS — BP 140/76 | HR 78 | Temp 98.4°F | Resp 16 | Ht 68.0 in | Wt 164.7 lb

## 2017-12-13 DIAGNOSIS — Z720 Tobacco use: Secondary | ICD-10-CM | POA: Insufficient documentation

## 2017-12-13 DIAGNOSIS — B351 Tinea unguium: Secondary | ICD-10-CM

## 2017-12-13 DIAGNOSIS — I10 Essential (primary) hypertension: Secondary | ICD-10-CM

## 2017-12-13 DIAGNOSIS — R0789 Other chest pain: Secondary | ICD-10-CM

## 2017-12-13 DIAGNOSIS — R634 Abnormal weight loss: Secondary | ICD-10-CM

## 2017-12-13 DIAGNOSIS — E1165 Type 2 diabetes mellitus with hyperglycemia: Secondary | ICD-10-CM | POA: Diagnosis not present

## 2017-12-13 DIAGNOSIS — E782 Mixed hyperlipidemia: Secondary | ICD-10-CM

## 2017-12-13 DIAGNOSIS — J431 Panlobular emphysema: Secondary | ICD-10-CM

## 2017-12-13 DIAGNOSIS — E1169 Type 2 diabetes mellitus with other specified complication: Secondary | ICD-10-CM | POA: Diagnosis not present

## 2017-12-13 DIAGNOSIS — IMO0001 Reserved for inherently not codable concepts without codable children: Secondary | ICD-10-CM

## 2017-12-13 MED ORDER — GLUCOSE BLOOD VI STRP: ORAL_STRIP | 3 refills | Status: DC

## 2017-12-13 MED ORDER — BLOOD GLUCOSE METER KIT: PACK | 0 refills | Status: DC

## 2017-12-13 MED ORDER — SITAGLIPTIN PHOSPHATE 100 MG PO TABS: 100.0000 mg | ORAL_TABLET | Freq: Every day | ORAL | 5 refills | Status: DC

## 2017-12-13 NOTE — Progress Notes (Signed)
BP 140/76   Pulse 78   Temp 98.4 F (36.9 C) (Oral)   Resp 16   Ht _0  (1.727 m)   Wt 164 lb 11.2 oz (74.7 kg)   SpO2 97%   BMI 25.04 kg/m    Subjective:    Patient ID: Connor Aguilar, male    DOB: 1940/11/13, 77 y.o.   MRN: [id number]  HPI: Connor Aguilar is a 77 y.o. male  Chief Complaint  Patient presents with  . Follow-up    3 month  . Cough    when pt coughs he states his chest hurts and then it stops.  Onset 5 months ago normal imaging.  Pt Fell 5 months ago and hit chest.  . Foot Pain    right top across toes    HPI  Patient is new to me; previous provider left our practice  Type 2 diabetes; going on for a few years; does not want insulin if he can help it Last A1c was 7.4 Thick toenails  High cholesterol; last LDL 22 and last HDL 49; on statin; not much bacon; has sausage sometimes; not much cheese; not sure if high chol runs in the family; better eating habits  HTN; on medicine for this; does add salt to his food, not much  Hx of sepsis and pneumonia; 2017; not sure where diagnosis of emphysema came from; smoking from 5 am to 10 pm, 2 ppd; not really inhaling; chest pain ever since falling across a step, came down on the step and really bruised it up; has been seen since then, had imaging done; hurts when taking a deep breath  Depression screen [facility] 2/9 12/13/2017 08/31/2017 02/01/2017 05/26/2016 02/11/2016  Decreased Interest 0 0 0 0 0  Down, Depressed, Hopeless 0 0 0 0 0  PHQ - 2 Score 0 0 0 0 0    Relevant past medical, surgical, family and social history reviewed Past Medical History:  Diagnosis Date  . Allergy   . Diabetes mellitus without complication ([place])   . Hyperlipidemia   . Hypertension   . Tobacco abuse    Past Surgical History:  Procedure Laterality Date  . FOOT SURGERY     Family History  Problem Relation Age of Onset  . Diabetes Mother    Social History   Tobacco Use  . Smoking status: Current Every Day Smoker    Packs/day:  2.00    Types: Cigarettes  . Smokeless tobacco: Never Used  Substance Use Topics  . Alcohol use: No    Alcohol/week: 0.0 oz  . Drug use: No   Interim medical history since last visit reviewed. Allergies and medications reviewed  Review of Systems Per HPI unless specifically indicated above     Objective:    BP 140/76   Pulse 78   Temp 98.4 F (36.9 C) (Oral)   Resp 16   Ht _1  (1.727 m)   Wt 164 lb 11.2 oz (74.7 kg)   SpO2 97%   BMI 25.04 kg/m   Wt Readings from Last 3 Encounters:  12/13/17 164 lb 11.2 oz (74.7 kg)  08/31/17 167 lb 1.6 oz (75.8 kg)  07/11/17 168 lb 14.4 oz (76.6 kg)    Physical Exam Diabetic Foot Form - Detailed   Diabetic Foot Exam - detailed Diabetic Foot exam was performed with the following findings:  Yes 12/13/2017  2:51 PM  Visual Foot Exam completed.:  Yes  Pulse Foot Exam completed.:  Yes  Right Dorsalis  Pedis:  Present Left Dorsalis Pedis:  Present  Sensory Foot Exam Completed.:  Yes Semmes-Weinstein Monofilament Test R Site 1-Great Toe:  Pos L Site 1-Great Toe:  Pos    Comments:  isgnificant thickening and yellowing of all of the toenails; deformity of the RIGHT foot (c/w with prior trauma)     Results for orders placed or performed in visit on 08/31/17  Lipid panel  Result Value Ref Range   Cholesterol 96 <200 mg/dL   HDL 49 >40 mg/dL   Triglycerides 170 (H) <150 mg/dL   LDL Cholesterol (Calc) 22 mg/dL (calc)   Total CHOL/HDL Ratio 2.0 <5.0 (calc)   Non-HDL Cholesterol (Calc) 47 <130 mg/dL (calc)  COMPLETE METABOLIC PANEL WITH GFR  Result Value Ref Range   Glucose, Bld 157 (H) 65 - 99 mg/dL   BUN 15 7 - 25 mg/dL   Creat 0.97 0.70 - 1.18 mg/dL   GFR, Est Non African American 74 > OR = 60 mL/min/1.86m   GFR, Est African American 86 > OR = 60 mL/min/1.753m  BUN/Creatinine Ratio NOT APPLICABLE 6 - 22 (calc)   Sodium 136 135 - 146 mmol/L   Potassium 4.6 3.5 - 5.3 mmol/L   Chloride 96 (L) 98 - 110 mmol/L   CO2 28 20 - 32  mmol/L   Calcium 10.1 8.6 - 10.3 mg/dL   Total Protein 7.3 6.1 - 8.1 g/dL   Albumin 4.7 3.6 - 5.1 g/dL   Globulin 2.6 1.9 - 3.7 g/dL (calc)   AG Ratio 1.8 1.0 - 2.5 (calc)   Total Bilirubin 1.0 0.2 - 1.2 mg/dL   Alkaline phosphatase (APISO) 53 40 - 115 U/L   AST 13 10 - 35 U/L   ALT 16 9 - 46 U/L  POCT HgB A1C  Result Value Ref Range   Hemoglobin A1C 7.4   POCT Glucose (CBG)  Result Value Ref Range   POC Glucose 147 (A) 70 - 99 mg/dl      Assessment & Plan:   Problem List Items Addressed This Visit      Cardiovascular and Mediastinum   Essential hypertension    Patient will try to cut back on salt; continue medicines        Respiratory   [place]lobular [place]ysema [place]   Cannot find proof of emphysema in his chart; will order screening chest CT         Endocrine   RESOLVED: Diabetes mellitus type 2, uncontrolled, without complications (HCC) - Primary   Relevant Medications   sitaGLIPtin (JANUVIA) 100 MG tablet   Other Relevant Orders   Hemoglobin A1C   Ambulatory referral to Ophthalmology   Ambulatory referral to Podiatry   Microalbumin / creatinine urine ratio     Other   Tobacco use   Relevant Orders   CT Chest W Contrast   Tobacco abuse    Patient is honest and admittedly is not ready to quit; talked with patient about trying straws, something else for the habit part of it so he doesn't just keep lighting up to have something in his mouth; advised that smoking can increase risk of several cancers, in addition to lung cancer, heart attack, and stroke; I am here to help if/when he is ready to quit; see AVS      Hyperlipidemia    Monitor lipids periodically; very low LDL with statin; TG still a little elevated       Other Visit Diagnoses    Abnormal weight loss  in a smoker with chronic anterior chest pain; will get chest CT   Relevant Orders   TSH   CBC with Differential/Platelet   Urinalysis w microscopic + reflex cultur   Other chest pain        ongoing since trauma; will get chest CT   Onychomycosis of multiple toenails with type 2 diabetes mellitus ([place])       refer to podiatrist; explained etiology   Relevant Medications   sitaGLIPtin (JANUVIA) 100 MG tablet       Follow up plan: Return in about 3 months (around 03/15/2018) for follow-up visit with Dr. Sanda Klein.  An after-visit summary was printed and given to the patient at [facility].  Please see the patient instructions which may contain other information and recommendations beyond what is mentioned above in the assessment and plan.  Meds ordered this encounter  Medications  . sitaGLIPtin (JANUVIA) 100 MG tablet    Sig: Take 1 tablet (100 mg total) by mouth daily.    Dispense:  30 tablet    Refill:  5  . blood glucose meter kit and supplies    Sig: Accuchek or whatever is covered by insurance; check sugars once a day only if desired; Dx E11.65, LON 99 months    Dispense:  1 each    Refill:  0    Order Specific Question:   Number of strips    Answer:   100    Order Specific Question:   Number of lancets    Answer:   3  . glucose blood test strip    Sig: Use as instructed to check sugars once a day if desired; LON 99 months, Dx E11.65    Dispense:  100 each    Refill:  3    Orders Placed This Encounter  Procedures  . CT Chest W Contrast  . Hemoglobin A1C  . TSH  . CBC with Differential/Platelet  . Urinalysis w microscopic + reflex cultur  . Microalbumin / creatinine urine ratio  . Ambulatory referral to Ophthalmology  . Ambulatory referral to Podiatry

## 2017-12-13 NOTE — Assessment & Plan Note (Signed)
Patient will try to cut back on salt; continue medicines

## 2017-12-13 NOTE — Assessment & Plan Note (Signed)
Cannot find proof of emphysema in his chart; will order screening chest CT

## 2017-12-13 NOTE — Assessment & Plan Note (Signed)
Patient is honest and admittedly is not ready to quit; talked with patient about trying straws, something else for the habit part of it so he doesn't just keep lighting up to have something in his mouth; advised that smoking can increase risk of several cancers, in addition to lung cancer, heart attack, and stroke; I am here to help if/when he is ready to quit; see AVS

## 2017-12-13 NOTE — Patient Instructions (Addendum)
Please call [phone] to schedule your imaging test chest CT Please wait 2-3 days after the order has been placed to call and get your test scheduled We'll get labs today We'll have you see the foot doctor and the eye doctor I do encourage you to quit smoking Call [phone] to sign up for smoking cessation classes You can call 1-800-QUIT-NOW to talk with a smoking cessation coach Try to follow the DASH guidelines (DASH stands for Dietary Approaches to Stop Hypertension). Try to limit the sodium in your diet to no more than 1,500mg  of sodium per day. Certainly try to not exceed 2,000 mg per day at the very most. Do not add salt when cooking or at the table.  Check the sodium amount on labels when shopping, and choose items lower in sodium when given a choice. Avoid or limit foods that already contain a lot of sodium. Eat a diet rich in fruits and vegetables and whole grains, and try to lose weight if overweight or obese  DASH Eating Plan DASH stands for "Dietary Approaches to Stop Hypertension." The DASH eating plan is a healthy eating plan that has been shown to reduce high blood pressure (hypertension). It may also reduce your risk for type 2 diabetes, heart disease, and stroke. The DASH eating plan may also help with weight loss. What are tips for following this plan? General guidelines  Avoid eating more than 2,300 mg (milligrams) of salt (sodium) a day. If you have hypertension, you may need to reduce your sodium intake to 1,500 mg a day.  Limit alcohol intake to no more than 1 drink a day for nonpregnant women and 2 drinks a day for men. One drink equals 12 oz of beer, 5 oz of wine, or 1 oz of hard liquor.  Work with your health care provider to maintain a healthy body weight or to lose weight. Ask what an ideal weight is for you.  Get at least 30 minutes of exercise that causes your heart to beat faster (aerobic exercise) most days of the week. Activities may include walking,  swimming, or biking.  Work with your health care provider or diet and nutrition specialist (dietitian) to adjust your eating plan to your individual calorie needs. Reading food labels  Check food labels for the amount of sodium per serving. Choose foods with less than 5 percent of the Daily Value of sodium. Generally, foods with less than 300 mg of sodium per serving fit into this eating plan.  To find whole grains, look for the word "whole" as the first word in the ingredient list. Shopping  Buy products labeled as "low-sodium" or "no salt added."  Buy fresh foods. Avoid canned foods and premade or frozen meals. Cooking  Avoid adding salt when cooking. Use salt-free seasonings or herbs instead of table salt or sea salt. Check with your health care provider or pharmacist before using salt substitutes.  Do not fry foods. Cook foods using healthy methods such as baking, boiling, grilling, and broiling instead.  Cook with heart-healthy oils, such as olive, canola, soybean, or sunflower oil. Meal planning   Eat a balanced diet that includes: ? 5 or more servings of fruits and vegetables each day. At each meal, try to fill half of your plate with fruits and vegetables. ? Up to 6-8 servings of whole grains each day. ? Less than 6 oz of lean meat, poultry, or fish each day. A 3-oz serving of meat is about the same size as  a deck of cards. One egg equals 1 oz. ? 2 servings of low-fat dairy each day. ? A serving of nuts, seeds, or beans 5 times each week. ? Heart-healthy fats. Healthy fats called Omega-3 fatty acids are found in foods such as flaxseeds and coldwater fish, like sardines, salmon, and mackerel.  Limit how much you eat of the following: ? Canned or prepackaged foods. ? Food that is high in trans fat, such as fried foods. ? Food that is high in saturated fat, such as fatty meat. ? Sweets, desserts, sugary drinks, and other foods with added sugar. ? Full-fat dairy  products.  Do not salt foods before eating.  Try to eat at least 2 vegetarian meals each week.  Eat more home-cooked food and less restaurant, buffet, and fast food.  When eating at a restaurant, ask that your food be prepared with less salt or no salt, if possible. What foods are recommended? The items listed may not be a complete list. Talk with your dietitian about what dietary choices are best for you. Grains Whole-grain or whole-wheat bread. Whole-grain or whole-wheat pasta. Brown rice. [place]. Bulgur. Whole-grain and low-sodium cereals. Pita bread. Low-fat, low-sodium crackers. Whole-wheat flour tortillas. Vegetables Fresh or frozen vegetables (raw, steamed, roasted, or grilled). Low-sodium or reduced-sodium tomato and vegetable juice. Low-sodium or reduced-sodium tomato sauce and tomato paste. Low-sodium or reduced-sodium canned vegetables. Fruits All fresh, dried, or frozen fruit. Canned fruit in natural juice (without added sugar). Meat and other protein foods Skinless chicken or [place]. Ground chicken or [place]. Pork with fat trimmed off. Fish and seafood. Egg whites. Dried beans, peas, or lentils. Unsalted nuts, nut butters, and seeds. Unsalted canned beans. Lean cuts of beef with fat trimmed off. Low-sodium, lean deli meat. Dairy Low-fat (1%) or fat-free (skim) milk. Fat-free, low-fat, or reduced-fat cheeses. Nonfat, low-sodium ricotta or cottage cheese. Low-fat or nonfat yogurt. Low-fat, low-sodium cheese. Fats and oils Soft margarine without trans fats. Vegetable oil. Low-fat, reduced-fat, or light mayonnaise and salad dressings (reduced-sodium). Canola, safflower, olive, soybean, and sunflower oils. Avocado. Seasoning and other foods Herbs. Spices. Seasoning mixes without salt. Unsalted popcorn and pretzels. Fat-free sweets. What foods are not recommended? The items listed may not be a complete list. Talk with your dietitian about what dietary choices are best for  you. Grains Baked goods made with fat, such as croissants, muffins, or some breads. Dry pasta or rice meal packs. Vegetables Creamed or fried vegetables. Vegetables in a cheese sauce. Regular canned vegetables (not low-sodium or reduced-sodium). Regular canned tomato sauce and paste (not low-sodium or reduced-sodium). Regular tomato and vegetable juice (not low-sodium or reduced-sodium). Angie Fava. Olives. Fruits Canned fruit in a light or heavy syrup. Fried fruit. Fruit in cream or butter sauce. Meat and other protein foods Fatty cuts of meat. Ribs. Fried meat. Berniece Salines. Sausage. Bologna and other processed lunch meats. Salami. Fatback. Hotdogs. Bratwurst. Salted nuts and seeds. Canned beans with added salt. Canned or smoked fish. Whole eggs or egg yolks. Chicken or [place] with skin. Dairy Whole or 2% milk, cream, and half-and-half. Whole or full-fat cream cheese. Whole-fat or sweetened yogurt. Full-fat cheese. Nondairy creamers. Whipped toppings. Processed cheese and cheese spreads. Fats and oils Butter. Stick margarine. Lard. Shortening. Ghee. Bacon fat. Tropical oils, such as coconut, palm kernel, or palm oil. Seasoning and other foods Salted popcorn and pretzels. Onion salt, garlic salt, seasoned salt, table salt, and sea salt. Worcestershire sauce. Tartar sauce. Barbecue sauce. Teriyaki sauce. Soy sauce, including reduced-sodium. Steak sauce.  Canned and packaged gravies. Fish sauce. Oyster sauce. Cocktail sauce. Horseradish that you find on the shelf. Ketchup. Mustard. Meat flavorings and tenderizers. Bouillon cubes. Hot sauce and Tabasco sauce. Premade or packaged marinades. Premade or packaged taco seasonings. Relishes. Regular salad dressings. Where to find more information:  National Heart, Lung, and [place]: https://wilson-eaton.com/  American Heart Association: www.heart.org Summary  The DASH eating plan is a healthy eating plan that has been shown to reduce high blood pressure  (hypertension). It may also reduce your risk for type 2 diabetes, heart disease, and stroke.  With the DASH eating plan, you should limit salt (sodium) intake to 2,300 mg a day. If you have hypertension, you may need to reduce your sodium intake to 1,500 mg a day.  When on the DASH eating plan, aim to eat more fresh fruits and vegetables, whole grains, lean proteins, low-fat dairy, and heart-healthy fats.  Work with your health care provider or diet and nutrition specialist (dietitian) to adjust your eating plan to your individual calorie needs. This information is not intended to replace advice given to you by your health care provider. Make sure you discuss any questions you have with your health care provider. Document Released: 07/15/2011 Document Revised: 07/19/2016 Document Reviewed: 07/19/2016 Elsevier Interactive Patient Education  [employer].

## 2017-12-13 NOTE — Assessment & Plan Note (Signed)
Monitor lipids periodically; very low LDL with statin; TG still a little elevated

## 2017-12-14 LAB — CBC WITH DIFFERENTIAL/PLATELET
BASOS ABS: 91 {cells}/uL (ref 0–200)
Basophils Relative: 1.2 %
EOS ABS: 213 {cells}/uL (ref 15–500)
EOS PCT: 2.8 %
HEMATOCRIT: 39.4 % (ref 38.5–50.0)
Hemoglobin: 14.3 g/dL (ref 13.2–17.1)
Lymphs Abs: 2113 cells/uL (ref 850–3900)
MCH: 31.8 pg (ref 27.0–33.0)
MCHC: 36.3 g/dL — AB (ref 32.0–36.0)
MCV: 87.8 fL (ref 80.0–100.0)
MONOS PCT: 7.5 %
MPV: 10.3 fL (ref 7.5–12.5)
NEUTROS PCT: 60.7 %
Neutro Abs: 4613 cells/uL (ref 1500–7800)
Platelets: 272 10*3/uL (ref 140–400)
RBC: 4.49 10*6/uL (ref 4.20–5.80)
RDW: 12.8 % (ref 11.0–15.0)
Total Lymphocyte: 27.8 %
WBC mixed population: 570 cells/uL (ref 200–950)
WBC: 7.6 10*3/uL (ref 3.8–10.8)

## 2017-12-14 LAB — TSH: TSH: 2.46 m[IU]/L (ref 0.40–4.50)

## 2017-12-14 LAB — URINALYSIS W MICROSCOPIC + REFLEX CULTURE
Bacteria, UA: NONE SEEN /HPF
Bilirubin Urine: NEGATIVE
Glucose, UA: NEGATIVE
Hgb urine dipstick: NEGATIVE
Hyaline Cast: NONE SEEN /LPF
Ketones, ur: NEGATIVE
Leukocyte Esterase: NEGATIVE
Nitrites, Initial: NEGATIVE
Protein, ur: NEGATIVE
RBC / HPF: NONE SEEN /HPF (ref 0–2)
Specific Gravity, Urine: 1.01 (ref 1.001–1.03)
Squamous Epithelial / HPF: NONE SEEN /HPF
WBC, UA: NONE SEEN /HPF (ref 0–5)
pH: <=5 (ref 5.0–8.0)

## 2017-12-14 LAB — HEMOGLOBIN A1C
EAG (MMOL/L): 8.1 (calc)
Hgb A1c MFr Bld: 6.7 % of total Hgb — ABNORMAL HIGH (ref <5.7)
Mean Plasma Glucose: 146 (calc)

## 2017-12-14 LAB — NO CULTURE INDICATED

## 2017-12-14 LAB — MICROALBUMIN / CREATININE URINE RATIO
Creatinine, Urine: 29 mg/dL (ref 20–320)
Microalb Creat Ratio: 7 mcg/mg creat (ref <30)
Microalb, Ur: 0.2 mg/dL

## 2017-12-27 ENCOUNTER — Ambulatory Visit: Admission: RE | Admit: 2017-12-27 | Payer: Medicare HMO | Source: Ambulatory Visit

## 2018-01-05 ENCOUNTER — Ambulatory Visit: Payer: Self-pay | Admitting: Podiatry

## 2018-01-09 ENCOUNTER — Other Ambulatory Visit: Payer: Self-pay | Admitting: Family Medicine

## 2018-01-09 DIAGNOSIS — R059 Cough, unspecified: Secondary | ICD-10-CM

## 2018-01-09 DIAGNOSIS — R0789 Other chest pain: Secondary | ICD-10-CM

## 2018-01-09 DIAGNOSIS — R05 Cough: Secondary | ICD-10-CM

## 2018-01-09 NOTE — Progress Notes (Signed)
I ordered the chest CT and signed it on 12/13/17 I should not need to do any verbal co-sign for this Please look into this

## 2018-01-11 ENCOUNTER — Ambulatory Visit: Admission: RE | Admit: 2018-01-11 | Payer: Medicare HMO | Source: Ambulatory Visit

## 2018-02-13 ENCOUNTER — Other Ambulatory Visit: Payer: Self-pay

## 2018-02-13 DIAGNOSIS — I10 Essential (primary) hypertension: Secondary | ICD-10-CM

## 2018-02-13 DIAGNOSIS — E782 Mixed hyperlipidemia: Secondary | ICD-10-CM

## 2018-02-13 DIAGNOSIS — E08641 Diabetes mellitus due to underlying condition with hypoglycemia with coma: Secondary | ICD-10-CM

## 2018-02-13 MED ORDER — ROSUVASTATIN CALCIUM 5 MG PO TABS: ORAL_TABLET | ORAL | 1 refills | Status: DC

## 2018-02-13 MED ORDER — METFORMIN HCL 1000 MG PO TABS: 1000.0000 mg | ORAL_TABLET | Freq: Two times a day (BID) | ORAL | 0 refills | Status: DC

## 2018-02-13 MED ORDER — AMLODIPINE BESYLATE 5 MG PO TABS: ORAL_TABLET | ORAL | 1 refills | Status: DC

## 2018-03-16 ENCOUNTER — Ambulatory Visit: Payer: Medicare HMO | Admitting: Family Medicine

## 2018-03-21 ENCOUNTER — Telehealth: Payer: Self-pay

## 2018-03-21 NOTE — Telephone Encounter (Signed)
Called pt to sched AWV w/ [facility]. LVM requesting returned call.  

## 2018-04-17 ENCOUNTER — Other Ambulatory Visit: Payer: Self-pay | Admitting: Family Medicine

## 2018-04-17 ENCOUNTER — Ambulatory Visit: Payer: Medicare HMO | Admitting: Family Medicine

## 2018-04-17 DIAGNOSIS — I10 Essential (primary) hypertension: Secondary | ICD-10-CM

## 2018-04-17 DIAGNOSIS — E08641 Diabetes mellitus due to underlying condition with hypoglycemia with coma: Secondary | ICD-10-CM

## 2018-04-17 NOTE — Telephone Encounter (Signed)
Copied from [place]. Topic: Quick Communication - Rx Refill/Question >> Apr 17, 2018  1:59 PM Neva Seat wrote: rosuvastatin (CRESTOR) 5 MG tablet amLODipine (NORVASC) 5 MG tablet irbesartan-hydrochlorothiazide (AVALIDE) 150-12.5 MG tablet metFORMIN (GLUCOPHAGE) 1000 MG tablet  Pt will need refills on the above medications.  He will run out before he can make the 05-04-18 appt.

## 2018-04-19 MED ORDER — IRBESARTAN-HYDROCHLOROTHIAZIDE 150-12.5 MG PO TABS
1.0000 | ORAL_TABLET | Freq: Every day | ORAL | 0 refills | Status: DC
Start: 2018-04-19 — End: 2018-09-27

## 2018-04-19 NOTE — Telephone Encounter (Signed)
Patient is overdue for appt; canceled / no showed last two visits He has appt late Sept; Rx approved for one month

## 2018-05-04 ENCOUNTER — Ambulatory Visit: Payer: Medicare HMO | Admitting: Family Medicine

## 2018-05-16 ENCOUNTER — Encounter: Payer: Self-pay | Admitting: Family Medicine

## 2018-05-16 ENCOUNTER — Ambulatory Visit (INDEPENDENT_AMBULATORY_CARE_PROVIDER_SITE_OTHER): Payer: Medicare HMO | Admitting: Family Medicine

## 2018-05-16 ENCOUNTER — Telehealth: Payer: Self-pay | Admitting: Family Medicine

## 2018-05-16 VITALS — BP 138/84 | HR 78 | Temp 98.3°F | Ht 68.0 in | Wt 168.6 lb

## 2018-05-16 DIAGNOSIS — Z720 Tobacco use: Secondary | ICD-10-CM

## 2018-05-16 DIAGNOSIS — Z5181 Encounter for therapeutic drug level monitoring: Secondary | ICD-10-CM | POA: Diagnosis not present

## 2018-05-16 DIAGNOSIS — E1169 Type 2 diabetes mellitus with other specified complication: Secondary | ICD-10-CM | POA: Diagnosis not present

## 2018-05-16 DIAGNOSIS — B351 Tinea unguium: Secondary | ICD-10-CM

## 2018-05-16 DIAGNOSIS — E08641 Diabetes mellitus due to underlying condition with hypoglycemia with coma: Secondary | ICD-10-CM

## 2018-05-16 DIAGNOSIS — I10 Essential (primary) hypertension: Secondary | ICD-10-CM

## 2018-05-16 DIAGNOSIS — E782 Mixed hyperlipidemia: Secondary | ICD-10-CM

## 2018-05-16 DIAGNOSIS — J431 Panlobular emphysema: Secondary | ICD-10-CM

## 2018-05-16 MED ORDER — METFORMIN HCL 1000 MG PO TABS: 1000.0000 mg | ORAL_TABLET | Freq: Two times a day (BID) | ORAL | 0 refills | Status: DC

## 2018-05-16 MED ORDER — SITAGLIPTIN PHOSPHATE 100 MG PO TABS: 100.0000 mg | ORAL_TABLET | Freq: Every day | ORAL | 0 refills | Status: DC

## 2018-05-16 NOTE — Assessment & Plan Note (Addendum)
Recommended chest CT; re-order that; he is not interested in quitting

## 2018-05-16 NOTE — Assessment & Plan Note (Signed)
Controlled; continue medicine; limiting salt

## 2018-05-16 NOTE — Assessment & Plan Note (Signed)
Asymptomatic; he is not ready to quit smoking

## 2018-05-16 NOTE — Patient Instructions (Signed)
We'll have you see the foot doctor We'll get labs today If you have not heard anything from my staff in a week about any orders/referrals/studies from today, please contact us here to follow-up [phone] I do encourage you to quit smoking Call [phone] to sign up for smoking cessation classes You can call 1-800-QUIT-NOW to talk with a smoking cessation coach

## 2018-05-16 NOTE — Progress Notes (Signed)
BP 138/84   Pulse 78   Temp 98.3 F (36.8 C) (Oral)   Ht 5\' 8"  (1.727 m)   Wt 168 lb 9.6 oz (76.5 kg)   SpO2 98%   BMI 25.64 kg/m    Subjective:    Patient ID: Connor Aguilar, male    DOB: 02/16/1941, 77 y.o.   MRN: [id number]  HPI: Connor Aguilar is a 77 y.o. male  Chief Complaint  Patient presents with  . Follow-up  . Nail Problem    HPI Patient is here for follow-up  He has type 2 diabetes; chronic problem Lab Results  Component Value Date   HGBA1C 7.1 (H) 05/16/2018   Skin on the feet is dry; toenails and thick and yellow  Old right foot injury, tree fell on it years ago; never had surgery for that; the deformity is chronic  High cholesterol; "I eat what I want to"; he wasn't going to lie to me; no cheese; on crestor  Lab Results  Component Value Date   CHOL 105 05/16/2018   HDL 44 05/16/2018   LDLCALC 33 05/16/2018   TRIG 222 (H) 05/16/2018   CHOLHDL 2.4 05/16/2018   Smoking the same; no desire to quit right now; I ordered a chest CT but he said he never had it done; father chewed tobacco; started smoking at age 47  Emphysema; working every day; he is not limited by his lungs, can get around just fine  Quit drinking in 1988; just laid it down  HTN; not adding much salt to his foods; chronic condition, on medicine  Depression screen [facility] 2/9 05/16/2018 12/13/2017 08/31/2017 02/01/2017 05/26/2016  Decreased Interest 0 0 0 0 0  Down, Depressed, Hopeless 0 0 0 0 0  PHQ - 2 Score 0 0 0 0 0  Altered sleeping 0 - - - -  Tired, decreased energy 0 - - - -  Change in appetite 0 - - - -  Feeling bad or failure about yourself  0 - - - -  Trouble concentrating 0 - - - -  Moving slowly or fidgety/restless 0 - - - -  Suicidal thoughts 0 - - - -  PHQ-9 Score 0 - - - -  Difficult doing work/chores Not difficult at all - - - -   Fall Risk  05/16/2018 12/13/2017 08/31/2017 02/01/2017 05/26/2016  Falls in the past year? No Yes Yes No No  Number falls in past yr: - 1 1 - -   Injury with Fall? - Yes Yes - -  Risk for fall due to : - - Other (Comment) - -  Risk for fall due to: Comment - - trying moving stuff - -  Follow up - - Education provided - -    Relevant past medical, surgical, family and social history reviewed Past Medical History:  Diagnosis Date  . Allergy   . Diabetes mellitus without complication ([place])   . Hyperlipidemia   . Hypertension   . Tobacco abuse    Past Surgical History:  Procedure Laterality Date  . FOOT SURGERY     Family History  Problem Relation Age of Onset  . Diabetes Mother    Social History   Tobacco Use  . Smoking status: Current Every Day Smoker    Packs/day: 2.00    Years: 64.00    Pack years: 128.00    Types: Cigarettes  . Smokeless tobacco: Never Used  Substance Use Topics  . Alcohol use: No  Alcohol/week: 0.0 standard drinks  . Drug use: No     [facility] Visit from 05/16/2018 in [facility]  AUDIT-C Score  0      Interim medical history since last visit reviewed. Allergies and medications reviewed  Review of Systems Per HPI unless specifically indicated above     Objective:    BP 138/84   Pulse 78   Temp 98.3 F (36.8 C) (Oral)   Ht 5\' 8"  (1.727 m)   Wt 168 lb 9.6 oz (76.5 kg)   SpO2 98%   BMI 25.64 kg/m   Wt Readings from Last 3 Encounters:  05/16/18 168 lb 9.6 oz (76.5 kg)  12/13/17 164 lb 11.2 oz (74.7 kg)  08/31/17 167 lb 1.6 oz (75.8 kg)    Physical Exam  Constitutional: He appears well-developed and well-nourished. No distress.  HENT:  Head: Normocephalic and atraumatic.  Eyes: EOM are normal. No scleral icterus.  Neck: No thyromegaly present.  Cardiovascular: Normal rate and regular rhythm.  Pulmonary/Chest: Effort normal and breath sounds normal.  Abdominal: Soft. Bowel sounds are normal. He exhibits no distension.  Musculoskeletal: He exhibits no edema.  Neurological: Coordination normal.  Skin: Skin is warm and dry. No pallor.  Psychiatric: He  has a normal mood and affect. His behavior is normal. Judgment and thought content normal.   Diabetic Foot Form - Detailed   Diabetic Foot Exam - detailed Diabetic Foot exam was performed with the following findings:  Yes 05/16/2018  4:55 PM  Visual Foot Exam completed.:  Yes  Pulse Foot Exam completed.:  Yes  Right Dorsalis Pedis:  Present Left Dorsalis Pedis:  Present  Sensory Foot Exam Completed.:  Yes Semmes-Weinstein Monofilament Test R Site 1-Great Toe:  Pos L Site 1-Great Toe:  Pos          Assessment & Plan:   Problem List Items Addressed This Visit      Cardiovascular and Mediastinum   Essential hypertension    Controlled; continue medicine; limiting salt        Respiratory   Panlobular emphysema ([place])    Asymptomatic; he is not ready to quit smoking        Musculoskeletal and Integument   Onychomycosis of multiple toenails with type 2 diabetes mellitus (HCC)    Check A1c today; foot exam done by MD; refer to podiatrist for nail care      Relevant Orders   Ambulatory referral to [place] GFR (Completed)   Hemoglobin A1c (Completed)     Other   Tobacco abuse    Recommended chest CT; re-order that; he is not interested in quitting      Hyperlipidemia    Check lipids today; suggestions made but respectful of patient's wishes; continue statin      Relevant Orders   Lipid panel (Completed)    Other Visit Diagnoses    Onychomycosis of toenail    -  Primary   Relevant Orders   Ambulatory referral to Podiatry   Medication monitoring encounter       Relevant Orders   COMPLETE METABOLIC PANEL WITH GFR (Completed)       Follow up plan: Return in about 6 months (around 11/15/2018).  An after-visit summary was printed and given to the patient at [place].  Please see the patient instructions which may contain other information and recommendations beyond what is mentioned above in the assessment and plan.  No orders of the  defined types  were placed in this encounter.   Orders Placed This Encounter  Procedures  . COMPLETE METABOLIC PANEL WITH GFR  . Hemoglobin A1c  . Lipid panel  . Ambulatory referral to Podiatry

## 2018-05-16 NOTE — Assessment & Plan Note (Signed)
Check A1c today; foot exam done by MD; refer to podiatrist for nail care

## 2018-05-16 NOTE — Assessment & Plan Note (Signed)
Check lipids today; suggestions made but respectful of patient's wishes; continue statin

## 2018-05-16 NOTE — Telephone Encounter (Signed)
Pt was seen today and wanted to make sure that you please send all his medication refills to [facility].

## 2018-05-17 ENCOUNTER — Telehealth: Payer: Self-pay | Admitting: *Deleted

## 2018-05-17 LAB — COMPLETE METABOLIC PANEL WITH GFR
AG Ratio: 1.9 (calc) (ref 1.0–2.5)
ALT: 15 U/L (ref 9–46)
AST: 13 U/L (ref 10–35)
Albumin: 4.5 g/dL (ref 3.6–5.1)
Alkaline phosphatase (APISO): 54 U/L (ref 40–115)
BUN: 16 mg/dL (ref 7–25)
CALCIUM: 9.7 mg/dL (ref 8.6–10.3)
CO2: 26 mmol/L (ref 20–32)
CREATININE: 1.09 mg/dL (ref 0.70–1.18)
Chloride: 97 mmol/L — ABNORMAL LOW (ref 98–110)
GFR, EST NON AFRICAN AMERICAN: 64 mL/min/{1.73_m2} (ref >=60)
GFR, Est African American: 74 mL/min/{1.73_m2} (ref >=60)
Globulin: 2.4 g/dL (calc) (ref 1.9–3.7)
Glucose, Bld: 191 mg/dL — ABNORMAL HIGH (ref 65–139)
POTASSIUM: 3.9 mmol/L (ref 3.5–5.3)
Sodium: 136 mmol/L (ref 135–146)
Total Bilirubin: 0.6 mg/dL (ref 0.2–1.2)
Total Protein: 6.9 g/dL (ref 6.1–8.1)

## 2018-05-17 LAB — HEMOGLOBIN A1C
EAG (MMOL/L): 8.7 (calc)
Hgb A1c MFr Bld: 7.1 % of total Hgb — ABNORMAL HIGH (ref <5.7)
Mean Plasma Glucose: 157 (calc)

## 2018-05-17 LAB — LIPID PANEL
Cholesterol: 105 mg/dL (ref <200)
HDL: 44 mg/dL (ref >40)
LDL Cholesterol (Calc): 33 mg/dL (calc)
Non-HDL Cholesterol (Calc): 61 mg/dL (calc) (ref <130)
Total CHOL/HDL Ratio: 2.4 (calc) (ref <5.0)
Triglycerides: 222 mg/dL — ABNORMAL HIGH (ref <150)

## 2018-05-17 NOTE — Telephone Encounter (Signed)
Received referral for low dose lung cancer screening CT scan.  Message left at phone number listed in EMR for patient to call either myself or Shawn Perkins back at [phone] to facilitate scheduling the scan.    

## 2018-05-19 ENCOUNTER — Encounter: Payer: Self-pay | Admitting: *Deleted

## 2018-05-19 ENCOUNTER — Telehealth: Payer: Self-pay | Admitting: *Deleted

## 2018-05-19 DIAGNOSIS — Z122 Encounter for screening for malignant neoplasm of respiratory organs: Secondary | ICD-10-CM

## 2018-05-19 NOTE — Telephone Encounter (Signed)
Received a referral for initial lung cancer screening scan.  Contacted the patient and obtained their smoking history, current smoker 2ppd with 128 pkyr history   as well as answering questions related to screening process.  Patient denies signs of lung cancer such as weight loss or hemoptysis at this time.  Patient denies comorbidity that would prevent curative treatment if lung cancer were found.  Patient is scheduled for the Shared Decision Making Visit and CT scan on 06-13-18@1400  .

## 2018-05-29 ENCOUNTER — Other Ambulatory Visit: Payer: Self-pay | Admitting: Family Medicine

## 2018-05-29 DIAGNOSIS — E08641 Diabetes mellitus due to underlying condition with hypoglycemia with coma: Secondary | ICD-10-CM

## 2018-06-02 ENCOUNTER — Other Ambulatory Visit: Payer: Self-pay | Admitting: Family Medicine

## 2018-06-02 DIAGNOSIS — I10 Essential (primary) hypertension: Secondary | ICD-10-CM

## 2018-06-06 ENCOUNTER — Ambulatory Visit: Payer: Self-pay | Admitting: Podiatry

## 2018-06-12 ENCOUNTER — Telehealth: Payer: Self-pay | Admitting: *Deleted

## 2018-06-12 NOTE — Telephone Encounter (Signed)
Called pt to inform them of appt for ldct screening on 06-13-18@1400 , voiced understanding.

## 2018-06-13 ENCOUNTER — Inpatient Hospital Stay: Payer: Medicare HMO | Admitting: Nurse Practitioner

## 2018-06-13 ENCOUNTER — Ambulatory Visit: Payer: Medicare HMO

## 2018-06-13 ENCOUNTER — Telehealth: Payer: Self-pay | Admitting: *Deleted

## 2018-06-13 NOTE — Telephone Encounter (Signed)
Spoke with daughter in law and they would like to change his appt to next month, dec 3rd at 1330 given to daughter in law, appt to be mailed to patient.

## 2018-06-20 ENCOUNTER — Ambulatory Visit: Payer: Self-pay | Admitting: Podiatry

## 2018-07-10 ENCOUNTER — Telehealth: Payer: Self-pay | Admitting: *Deleted

## 2018-07-10 NOTE — Telephone Encounter (Signed)
Patient called to remind of lung screening appt tomorrow, patient request to have appt rescheduled in January.

## 2018-07-11 ENCOUNTER — Ambulatory Visit: Payer: Medicare HMO

## 2018-07-11 ENCOUNTER — Inpatient Hospital Stay: Payer: Medicare HMO | Admitting: Oncology

## 2018-08-03 ENCOUNTER — Telehealth: Payer: Self-pay | Admitting: *Deleted

## 2018-08-03 NOTE — Telephone Encounter (Signed)
Attempted to reschedule lung screening scan. Patient requests a call back at end of January.

## 2018-08-10 ENCOUNTER — Inpatient Hospital Stay: Admission: RE | Admit: 2018-08-10 | Payer: Medicare HMO | Source: Ambulatory Visit

## 2018-08-10 ENCOUNTER — Ambulatory Visit: Payer: Medicare HMO | Admitting: Nurse Practitioner

## 2018-08-28 ENCOUNTER — Telehealth: Payer: Self-pay | Admitting: *Deleted

## 2018-08-28 NOTE — Telephone Encounter (Signed)
Patient contacted in attempt to reschedule lung screening scan. Patient would like to schedule scan on 09/23/18 in the morning. Please see prior documentation for eligibility requirements.

## 2018-09-08 ENCOUNTER — Telehealth: Payer: Self-pay | Admitting: Family Medicine

## 2018-09-08 DIAGNOSIS — E08641 Diabetes mellitus due to underlying condition with hypoglycemia with coma: Secondary | ICD-10-CM

## 2018-09-08 MED ORDER — METFORMIN HCL 1000 MG PO TABS: 1000.0000 mg | ORAL_TABLET | Freq: Two times a day (BID) | ORAL | 0 refills | Status: DC

## 2018-09-08 NOTE — Telephone Encounter (Signed)
Wt Readings from Last 3 Encounters:  05/16/18 168 lb 9.6 oz (76.5 kg)  12/13/17 164 lb 11.2 oz (74.7 kg)  08/31/17 167 lb 1.6 oz (75.8 kg)   Lab Results  Component Value Date   CREATININE 1.09 05/16/2018   Lab Results  Component Value Date   HGBA1C 7.1 (H) 05/16/2018   Patient's last A1c was uncontrolled We like to see those folks every THREE months, not every six months Please ask him to schedule an appointment ASAP I'll refill the med as requested and we look forward to helping him with his diabetes soon

## 2018-09-08 NOTE — Telephone Encounter (Signed)
Copied from [place] [phone]. Topic: Quick Communication - Rx Refill/Question >> Sep 08, 2018  9:19 AM Keene Breath wrote: Medication: metFORMIN (GLUCOPHAGE) 1000 MG tablet  Patient called to request a refill for the above medication  Preferred Pharmacy (with phone number or street name): [place], [place] [facility] [place] [phone] (Phone) [phone] (Fax)

## 2018-09-11 NOTE — Telephone Encounter (Signed)
Pt scheduled appt for next week

## 2018-09-14 ENCOUNTER — Other Ambulatory Visit: Payer: Self-pay | Admitting: Family Medicine

## 2018-09-14 DIAGNOSIS — E08641 Diabetes mellitus due to underlying condition with hypoglycemia with coma: Secondary | ICD-10-CM

## 2018-09-19 ENCOUNTER — Ambulatory Visit: Payer: Medicare HMO | Admitting: Family Medicine

## 2018-09-22 ENCOUNTER — Ambulatory Visit: Payer: Medicare HMO

## 2018-09-22 ENCOUNTER — Ambulatory Visit: Payer: Medicare HMO | Admitting: Family Medicine

## 2018-09-25 ENCOUNTER — Inpatient Hospital Stay: Payer: Medicare HMO | Admitting: Nurse Practitioner

## 2018-09-25 ENCOUNTER — Ambulatory Visit: Admission: RE | Admit: 2018-09-25 | Payer: Medicare HMO | Source: Ambulatory Visit

## 2018-09-26 ENCOUNTER — Telehealth: Payer: Self-pay | Admitting: *Deleted

## 2018-09-26 NOTE — Telephone Encounter (Signed)
Contacted in attempt to reschedule lung screening scan that patient was no show for. Patient reports that he can not do the scan until next month due to his schedule.

## 2018-09-27 ENCOUNTER — Encounter: Payer: Self-pay | Admitting: Family Medicine

## 2018-09-27 ENCOUNTER — Ambulatory Visit (INDEPENDENT_AMBULATORY_CARE_PROVIDER_SITE_OTHER): Payer: Medicare HMO | Admitting: Family Medicine

## 2018-09-27 VITALS — BP 124/78 | HR 72 | Temp 98.2°F | Resp 14 | Ht 68.0 in | Wt 173.4 lb

## 2018-09-27 DIAGNOSIS — E1129 Type 2 diabetes mellitus with other diabetic kidney complication: Secondary | ICD-10-CM | POA: Diagnosis not present

## 2018-09-27 DIAGNOSIS — E08641 Diabetes mellitus due to underlying condition with hypoglycemia with coma: Secondary | ICD-10-CM | POA: Diagnosis not present

## 2018-09-27 DIAGNOSIS — Z720 Tobacco use: Secondary | ICD-10-CM | POA: Diagnosis not present

## 2018-09-27 DIAGNOSIS — E1169 Type 2 diabetes mellitus with other specified complication: Secondary | ICD-10-CM | POA: Diagnosis not present

## 2018-09-27 DIAGNOSIS — J449 Chronic obstructive pulmonary disease, unspecified: Secondary | ICD-10-CM | POA: Diagnosis not present

## 2018-09-27 DIAGNOSIS — I10 Essential (primary) hypertension: Secondary | ICD-10-CM

## 2018-09-27 DIAGNOSIS — E782 Mixed hyperlipidemia: Secondary | ICD-10-CM | POA: Diagnosis not present

## 2018-09-27 DIAGNOSIS — B351 Tinea unguium: Secondary | ICD-10-CM

## 2018-09-27 MED ORDER — METFORMIN HCL 1000 MG PO TABS: 1000.0000 mg | ORAL_TABLET | Freq: Two times a day (BID) | ORAL | 0 refills | Status: DC

## 2018-09-27 MED ORDER — AMLODIPINE BESYLATE 5 MG PO TABS: ORAL_TABLET | ORAL | 1 refills | Status: DC

## 2018-09-27 MED ORDER — ROSUVASTATIN CALCIUM 5 MG PO TABS: ORAL_TABLET | ORAL | 1 refills | Status: DC

## 2018-09-27 MED ORDER — IRBESARTAN-HYDROCHLOROTHIAZIDE 150-12.5 MG PO TABS: 1.0000 | ORAL_TABLET | Freq: Every day | ORAL | 0 refills | Status: DC

## 2018-09-27 MED ORDER — SITAGLIPTIN PHOSPHATE 100 MG PO TABS: 100.0000 mg | ORAL_TABLET | Freq: Every day | ORAL | 0 refills | Status: DC

## 2018-09-27 NOTE — Assessment & Plan Note (Signed)
Patient does not want to quit smoking; he also does not want chest CT to screen for lung cancer; I respect his decision; however, I did talk with him about the role of screening, that some small lung cancers may be curable, versus the larger ones that are found later, especially if someone has lost weight and is coughing up blood, because by then it is likely too late; he still does not want any lung cancer screening and does not want to go looking for a problem

## 2018-09-27 NOTE — Assessment & Plan Note (Signed)
Limit saturated fats; check lipids, continue statin

## 2018-09-27 NOTE — Progress Notes (Signed)
BP 124/78   Pulse 72   Temp 98.2 F (36.8 C) (Oral)   Resp 14   Ht 5\' 8"  (1.727 m)   Wt 173 lb 6.4 oz (78.7 kg)   SpO2 98%   BMI 26.37 kg/m    Subjective:    Patient ID: Connor Aguilar, male    DOB: 1940/12/16, 78 y.o.   MRN: [id number]  HPI: Connor Aguilar is a 78 y.o. male  Chief Complaint  Patient presents with  . Follow-up  . Medication Refill    HPI Here for f/u Type 2 diabetes; thickened toenails Not checking sugars Last eye exam was a while ago  HTN; not using much salt, "some things you just got to have a little salt on" On ARB, thiazide, CCB No NSAIDs, just tylenol if needed Not a big water drinker; drinks a lot of diet Coke  High cholesterol On statin; limiting red meat; just about quit eating egg  COPD; does not want pneumonia shot or flu shot; no flares  Tobacco abuse; 1 ppd; no vaping; no where close to quitting he says; he quit once for a year and a half; "picked 'em up and ain't slowed down since"; he had a chest CT for lung cancer screening ordered but he did not want to get it done; he just isn't up for looking for cancer right now; even understands may be treatable when small; understands odds are bad if losing weight and coughing up blood later;  Depression screen [facility] 2/9 09/27/2018 05/16/2018 12/13/2017 08/31/2017 02/01/2017  Decreased Interest 0 0 0 0 0  Down, Depressed, Hopeless 0 0 0 0 0  PHQ - 2 Score 0 0 0 0 0  Altered sleeping 0 0 - - -  Tired, decreased energy 0 0 - - -  Change in appetite 0 0 - - -  Feeling bad or failure about yourself  0 0 - - -  Trouble concentrating 0 0 - - -  Moving slowly or fidgety/restless 0 0 - - -  Suicidal thoughts 0 0 - - -  PHQ-9 Score 0 0 - - -  Difficult doing work/chores Not difficult at all Not difficult at all - - -   Fall Risk  09/27/2018 05/16/2018 12/13/2017 08/31/2017 02/01/2017  Falls in the past year? 0 No Yes Yes No  Number falls in past yr: 0 - 1 1 -  Injury with Fall? 0 - Yes Yes -  Risk for fall  due to : - - - Other (Comment) -  Risk for fall due to: Comment - - - trying moving stuff -  Follow up - - - Education provided -    Relevant past medical, surgical, family and social history reviewed Past Medical History:  Diagnosis Date  . Allergy   . Diabetes mellitus without complication ([place])   . Hyperlipidemia   . Hypertension   . Tobacco abuse    Past Surgical History:  Procedure Laterality Date  . FOOT SURGERY     Family History  Problem Relation Age of Onset  . Diabetes Mother    Social History   Tobacco Use  . Smoking status: Current Every Day Smoker    Packs/day: 2.00    Years: 64.00    Pack years: 128.00    Types: Cigarettes  . Smokeless tobacco: Never Used  Substance Use Topics  . Alcohol use: No    Alcohol/week: 0.0 standard drinks  . Drug use: No     Office Visit  from 09/27/2018 in [facility]  AUDIT-C Score  0      Interim medical history since last visit reviewed. Allergies and medications reviewed  Review of Systems Per HPI unless specifically indicated above     Objective:    BP 124/78   Pulse 72   Temp 98.2 F (36.8 C) (Oral)   Resp 14   Ht 5\' 8"  (1.727 m)   Wt 173 lb 6.4 oz (78.7 kg)   SpO2 98%   BMI 26.37 kg/m   Wt Readings from Last 3 Encounters:  09/27/18 173 lb 6.4 oz (78.7 kg)  05/16/18 168 lb 9.6 oz (76.5 kg)  12/13/17 164 lb 11.2 oz (74.7 kg)    Physical Exam Constitutional:      General: He is not in acute distress.    Appearance: He is well-developed.  HENT:     Head: Normocephalic and atraumatic.  Eyes:     General: No scleral icterus. Neck:     Thyroid: No thyromegaly.  Cardiovascular:     Rate and Rhythm: Normal rate and regular rhythm.  Pulmonary:     Effort: Pulmonary effort is normal.     Breath sounds: Normal breath sounds.  Abdominal:     General: Bowel sounds are normal. There is no distension.     Palpations: Abdomen is soft.  Skin:    General: Skin is warm and dry.      Coloration: Skin is not pale.  Neurological:     Coordination: Coordination normal.  Psychiatric:        Behavior: Behavior normal.        Thought Content: Thought content normal.        Judgment: Judgment normal.    Diabetic Foot Form - Detailed   Diabetic Foot Exam - detailed Diabetic Foot exam was performed with the following findings:  Yes 09/27/2018  3:38 PM  Visual Foot Exam completed.:  Yes  Pulse Foot Exam completed.:  Yes  Right Dorsalis Pedis:  Present Left Dorsalis Pedis:  Present  Sensory Foot Exam Completed.:  Yes Semmes-Weinstein Monofilament Test R Site 1-Great Toe:  Pos L Site 1-Great Toe:  Pos        Results for orders placed or performed in visit on 05/16/18  COMPLETE METABOLIC PANEL WITH GFR  Result Value Ref Range   Glucose, Bld 191 (H) 65 - 139 mg/dL   BUN 16 7 - 25 mg/dL   Creat 1.09 0.70 - 1.18 mg/dL   GFR, Est Non African American 64 > OR = 60 mL/min/1.95m2   GFR, Est African American 74 > OR = 60 mL/min/1.9m2   BUN/Creatinine Ratio NOT APPLICABLE 6 - 22 (calc)   Sodium 136 135 - 146 mmol/L   Potassium 3.9 3.5 - 5.3 mmol/L   Chloride 97 (L) 98 - 110 mmol/L   CO2 26 20 - 32 mmol/L   Calcium 9.7 8.6 - 10.3 mg/dL   Total Protein 6.9 6.1 - 8.1 g/dL   Albumin 4.5 3.6 - 5.1 g/dL   Globulin 2.4 1.9 - 3.7 g/dL (calc)   AG Ratio 1.9 1.0 - 2.5 (calc)   Total Bilirubin 0.6 0.2 - 1.2 mg/dL   Alkaline phosphatase (APISO) 54 40 - 115 U/L   AST 13 10 - 35 U/L   ALT 15 9 - 46 U/L  Hemoglobin A1c  Result Value Ref Range   Hgb A1c MFr Bld 7.1 (H) <5.7 % of total Hgb   Mean Plasma Glucose 157 (calc)  eAG (mmol/L) 8.7 (calc)  Lipid panel  Result Value Ref Range   Cholesterol 105 <200 mg/dL   HDL 44 >40 mg/dL   Triglycerides 222 (H) <150 mg/dL   LDL Cholesterol (Calc) 33 mg/dL (calc)   Total CHOL/HDL Ratio 2.4 <5.0 (calc)   Non-HDL Cholesterol (Calc) 61 <130 mg/dL (calc)      Assessment & Plan:   Problem List Items Addressed This Visit       Cardiovascular and Mediastinum   Essential hypertension    Controlled today on current medicines; refill today; check K+ and Cr; try to limit salt      Relevant Medications   irbesartan-hydrochlorothiazide (AVALIDE) 150-12.5 MG tablet   amLODipine (NORVASC) 5 MG tablet   rosuvastatin (CRESTOR) 5 MG tablet     Musculoskeletal and Integument   Onychomycosis of multiple toenails with type 2 diabetes mellitus (HCC)   Relevant Medications   irbesartan-hydrochlorothiazide (AVALIDE) 150-12.5 MG tablet   rosuvastatin (CRESTOR) 5 MG tablet   sitaGLIPtin (JANUVIA) 100 MG tablet   metFORMIN (GLUCOPHAGE) 1000 MG tablet   Other Relevant Orders   Ambulatory referral to Podiatry     Other   Tobacco abuse    Patient does not want to quit smoking; he also does not want chest CT to screen for lung cancer; I respect his decision; however, I did talk with him about the role of screening, that some small lung cancers may be curable, versus the larger ones that are found later, especially if someone has lost weight and is coughing up blood, because by then it is likely too late; he still does not want any lung cancer screening and does not want to go looking for a problem      Hyperlipidemia    Limit saturated fats; check lipids, continue statin      Relevant Medications   irbesartan-hydrochlorothiazide (AVALIDE) 150-12.5 MG tablet   amLODipine (NORVASC) 5 MG tablet   rosuvastatin (CRESTOR) 5 MG tablet   Other Relevant Orders   Lipid panel    Other Visit Diagnoses    Diabetes mellitus due to underlying condition, uncontrolled, with hypoglycemia and coma ([place])    -  Primary   Relevant Medications   irbesartan-hydrochlorothiazide (AVALIDE) 150-12.5 MG tablet   rosuvastatin (CRESTOR) 5 MG tablet   sitaGLIPtin (JANUVIA) 100 MG tablet   metFORMIN (GLUCOPHAGE) 1000 MG tablet   Other Relevant Orders   Ambulatory referral to Ophthalmology   BASIC METABOLIC PANEL WITH GFR   Hemoglobin A1c    Onychomycosis of toenail       Relevant Orders   Ambulatory referral to Podiatry       Follow up plan: Return in about 3 months (around 12/26/2018).  An after-visit summary was printed and given to the patient at [place].  Please see the patient instructions which may contain other information and recommendations beyond what is mentioned above in the assessment and plan.  Meds ordered this encounter  Medications  . irbesartan-hydrochlorothiazide (AVALIDE) 150-12.5 MG tablet    Sig: Take 1 tablet by mouth daily.    Dispense:  90 tablet    Refill:  0  . amLODipine (NORVASC) 5 MG tablet    Sig: TAKE ONE (1) TABLET EACH DAY    Dispense:  90 tablet    Refill:  1  . rosuvastatin (CRESTOR) 5 MG tablet    Sig: TAKE ONE (1) TABLET AT BEDTIME    Dispense:  90 tablet    Refill:  1  . sitaGLIPtin (JANUVIA)  100 MG tablet    Sig: Take 1 tablet (100 mg total) by mouth daily.    Dispense:  90 tablet    Refill:  0  . metFORMIN (GLUCOPHAGE) 1000 MG tablet    Sig: Take 1 tablet (1,000 mg total) by mouth 2 (two) times daily with a meal.    Dispense:  180 tablet    Refill:  0    Orders Placed This Encounter  Procedures  . BASIC METABOLIC PANEL WITH GFR  . Hemoglobin A1c  . Lipid panel  . Ambulatory referral to Ophthalmology  . Ambulatory referral to Podiatry

## 2018-09-27 NOTE — Patient Instructions (Signed)
I do encourage you to quit smoking Call [phone] to sign up for smoking cessation classes You can call 1-800-QUIT-NOW to talk with a smoking cessation coach  Try to follow the DASH guidelines (DASH stands for Dietary Approaches to Stop Hypertension). Try to limit the sodium in your diet to no more than 1,500mg  of sodium per day. Certainly try to not exceed 2,000 mg per day at the very most. Do not add salt when cooking or at the table.  Check the sodium amount on labels when shopping, and choose items lower in sodium when given a choice. Avoid or limit foods that already contain a lot of sodium. Eat a diet rich in fruits and vegetables and whole grains, and try to lose weight if overweight or obese  Try to limit saturated fats in your diet (bologna, hot dogs, barbeque, cheeseburgers, hamburgers, steak, bacon, sausage, cheese, etc.) and get more fresh fruits, vegetables, and whole grains

## 2018-09-27 NOTE — Assessment & Plan Note (Signed)
Controlled today on current medicines; refill today; check K+ and Cr; try to limit salt

## 2018-09-28 LAB — BASIC METABOLIC PANEL WITH GFR
BUN: 13 mg/dL (ref 7–25)
CO2: 30 mmol/L (ref 20–32)
Calcium: 9.7 mg/dL (ref 8.6–10.3)
Chloride: 98 mmol/L (ref 98–110)
Creat: 0.94 mg/dL (ref 0.70–1.18)
GFR, Est African American: 89 mL/min/{1.73_m2} (ref >=60)
GFR, Est Non African American: 77 mL/min/{1.73_m2} (ref >=60)
GLUCOSE: 130 mg/dL — AB (ref 65–99)
Potassium: 4.3 mmol/L (ref 3.5–5.3)
Sodium: 136 mmol/L (ref 135–146)

## 2018-09-28 LAB — HEMOGLOBIN A1C
Hgb A1c MFr Bld: 7.1 % of total Hgb — ABNORMAL HIGH (ref <5.7)
Mean Plasma Glucose: 157 (calc)
eAG (mmol/L): 8.7 (calc)

## 2018-09-28 LAB — LIPID PANEL
CHOLESTEROL: 94 mg/dL (ref <200)
HDL: 44 mg/dL (ref >=40)
LDL Cholesterol (Calc): 25 mg/dL (calc)
Non-HDL Cholesterol (Calc): 50 mg/dL (calc) (ref <130)
Total CHOL/HDL Ratio: 2.1 (calc) (ref <5.0)
Triglycerides: 167 mg/dL — ABNORMAL HIGH (ref <150)

## 2018-11-16 ENCOUNTER — Ambulatory Visit: Payer: Medicare HMO | Admitting: Family Medicine

## 2018-11-30 ENCOUNTER — Ambulatory Visit: Payer: Medicare HMO

## 2018-11-30 ENCOUNTER — Other Ambulatory Visit: Payer: Self-pay

## 2018-11-30 ENCOUNTER — Ambulatory Visit: Payer: Medicare HMO | Admitting: Family Medicine

## 2018-12-04 ENCOUNTER — Ambulatory Visit (INDEPENDENT_AMBULATORY_CARE_PROVIDER_SITE_OTHER): Payer: Medicare HMO | Admitting: Nurse Practitioner

## 2018-12-04 ENCOUNTER — Other Ambulatory Visit: Payer: Self-pay

## 2018-12-04 ENCOUNTER — Encounter: Payer: Self-pay | Admitting: Nurse Practitioner

## 2018-12-04 DIAGNOSIS — I10 Essential (primary) hypertension: Secondary | ICD-10-CM | POA: Diagnosis not present

## 2018-12-04 DIAGNOSIS — E08641 Diabetes mellitus due to underlying condition with hypoglycemia with coma: Secondary | ICD-10-CM

## 2018-12-04 DIAGNOSIS — E782 Mixed hyperlipidemia: Secondary | ICD-10-CM | POA: Diagnosis not present

## 2018-12-04 DIAGNOSIS — Z720 Tobacco use: Secondary | ICD-10-CM

## 2018-12-04 MED ORDER — METFORMIN HCL 1000 MG PO TABS: 1000.0000 mg | ORAL_TABLET | Freq: Two times a day (BID) | ORAL | 0 refills | Status: DC

## 2018-12-04 MED ORDER — IRBESARTAN-HYDROCHLOROTHIAZIDE 150-12.5 MG PO TABS: 1.0000 | ORAL_TABLET | Freq: Every day | ORAL | 0 refills | Status: DC

## 2018-12-04 NOTE — Progress Notes (Signed)
Virtual Visit via Telephone Note  I connected with Connor Aguilar on 12/04/18 at  2:40 PM EDT by telephone and verified that I am speaking with the correct person using two identifiers.   Staff discussed the limitations, risks, security and privacy concerns of performing an evaluation and management service by telephone and the availability of in person appointments. Staff also discussed with the patient that there may be a patient responsible charge related to this service. The patient expressed understanding and agreed to proceed.  Patients location: home My location: work office  Other people in meeting: none    HPI   Patient states overall is doing well, has no complaints or concerns at this time.   Diabetes Takes metformin BID, Januvia daily Denies polyuria, polydipsia, polyphagia, headaches, hypoglycemic events Does not check blood sugars Lab Results  Component Value Date   HGBA1C 7.1 (H) 09/27/2018    Hypertension Patient is taking amlodipine 74m, irbesartan-HCTZ 150-12.548mdaily  BP Readings from Last 3 Encounters:  09/27/18 124/78  05/16/18 138/84  12/13/17 140/76    Hyperlipidemia Crestor 69m41maily Lab Results  Component Value Date   CHOL 94 09/27/2018   HDL 44 09/27/2018   LDLCALC 25 09/27/2018   TRIG 167 (H) 09/27/2018   CHOLHDL 2.1 09/27/2018    Tobacco Use: smoke about a pack and half a day states has nothing better to do, not willing to quit   [facility]2/9: [facility] 12/[facility]/2018 09/27/2018 05/16/2018 12/13/2017 08/31/2017  Decreased Interest 0 0 0 0 0  Down, Depressed, Hopeless 0 0 0 0 0  PHQ - 2 Score 0 0 0 0 0  Altered sleeping 0 0 0 - -  Tired, decreased energy 0 0 0 - -  Change in appetite 0 0 0 - -  Feeling bad or failure about yourself  0 0 0 - -  Trouble concentrating 0 0 0 - -  Moving slowly or fidgety/restless 0 0 0 - -  Suicidal thoughts 0 0 0 - -  PHQ-9 Score 0 0 0 - -  Difficult doing work/chores Not difficult at all Not  difficult at all Not difficult at all - -     PHQ reviewed. Negative  Patient Active Problem List   Diagnosis Date Noted  . Onychomycosis of multiple toenails with type 2 diabetes mellitus (HCCDelmita0/03/2018  . Recurrent productive cough 01/28/2016  . Elevated CPK 11/18/2015  . Erectile dysfunction 08/19/2015  . Tobacco abuse 04/09/2015  . Simple chronic bronchitis (HCCCl[place]8/25/2016  . Hyperlipidemia 04/02/2015  . Essential hypertension 04/02/2015  . Panlobular emphysema (HCCBeatty8/24/2016    Past Medical History:  Diagnosis Date  . Allergy   . Diabetes mellitus without complication ([place]CC[place] . Hyperlipidemia   . Hypertension   . Tobacco abuse     Past Surgical History:  Procedure Laterality Date  . FOOT SURGERY      Social History   Tobacco Use  . Smoking status: Current Every Day Smoker    Packs/day: 2.00    Years: 64.00    Pack years: 128.00    Types: Cigarettes  . Smokeless tobacco: Never Used  Substance Use Topics  . Alcohol use: No    Alcohol/week: 0.0 standard drinks     Current Outpatient Medications:  .  acetaminophen (TYLENOL) 325 MG tablet, Take 2 tablets (650 mg total) by mouth every 6 (six) hours as needed for mild pain (or Fever >/= 101)., Disp: , Rfl:  .  amLODipine ([place]  5 MG tablet, TAKE ONE (1) TABLET EACH DAY, Disp: 90 tablet, Rfl: 1 .  irbesartan-hydrochlorothiazide (AVALIDE) 150-12.5 MG tablet, Take 1 tablet by mouth daily., Disp: 90 tablet, Rfl: 0 .  metFORMIN (GLUCOPHAGE) 1000 MG tablet, Take 1 tablet (1,000 mg total) by mouth 2 (two) times daily with a meal., Disp: 180 tablet, Rfl: 0 .  rosuvastatin (CRESTOR) 5 MG tablet, TAKE ONE (1) TABLET AT BEDTIME, Disp: 90 tablet, Rfl: 1 .  sitaGLIPtin (JANUVIA) 100 MG tablet, Take 1 tablet (100 mg total) by mouth daily., Disp: 90 tablet, Rfl: 0 .  blood glucose meter kit and supplies, Accuchek or whatever is covered by insurance; check sugars once a day only if desired; Dx E11.65, LON 99 months, Disp:  1 each, Rfl: 0 .  glucose blood test strip, Use as instructed to check sugars once a day if desired; LON 99 months, Dx E11.65, Disp: 100 each, Rfl: 3  No Known Allergies  ROS   No other specific complaints in a complete review of systems (except as listed in HPI above).  Objective  There were no vitals filed for this visit.   There is no height or weight on file to calculate BMI.  Patient is alert, able to speak in full sentences without difficulty.   Assessment & Plan  1. Essential hypertension Amlodipine +  - irbesartan-hydrochlorothiazide (AVALIDE) 150-12.5 MG tablet; Take 1 tablet by mouth daily.  Dispense: 90 tablet; Refill: 0  2. Diabetes mellitus due to underlying condition, uncontrolled, with hypoglycemia and coma (HCC) Januvia +  - metFORMIN (GLUCOPHAGE) 1000 MG tablet; Take 1 tablet (1,000 mg total) by mouth 2 (two) times daily with a meal.  Dispense: 180 tablet; Refill: 0  3. Mixed hyperlipidemia crestor  4. Tobacco abuse Recommend cutting down      I discussed the assessment and treatment plan with the patient. The patient was provided an opportunity to ask questions and all were answered. The patient agreed with the plan and demonstrated an understanding of the instructions.   The patient was advised to call back or seek an in-person evaluation if the symptoms worsen or if the condition fails to improve as anticipated.  I provided 12 minutes of non-face-to-face time during this encounter.   Fredderick Severance, NP

## 2018-12-06 ENCOUNTER — Ambulatory Visit: Payer: Medicare HMO | Admitting: Podiatry

## 2018-12-18 ENCOUNTER — Ambulatory Visit: Payer: Medicare HMO | Admitting: Podiatry

## 2018-12-26 ENCOUNTER — Ambulatory Visit: Payer: Medicare HMO | Admitting: Family Medicine

## 2018-12-27 ENCOUNTER — Other Ambulatory Visit: Payer: Self-pay | Admitting: Family Medicine

## 2018-12-27 DIAGNOSIS — E782 Mixed hyperlipidemia: Secondary | ICD-10-CM

## 2018-12-27 DIAGNOSIS — I10 Essential (primary) hypertension: Secondary | ICD-10-CM

## 2019-01-03 ENCOUNTER — Encounter: Payer: Medicare HMO | Admitting: Podiatry

## 2019-01-05 DIAGNOSIS — E119 Type 2 diabetes mellitus without complications: Secondary | ICD-10-CM | POA: Diagnosis not present

## 2019-01-05 LAB — HM DIABETES EYE EXAM

## 2019-01-15 ENCOUNTER — Ambulatory Visit (INDEPENDENT_AMBULATORY_CARE_PROVIDER_SITE_OTHER): Payer: Medicare HMO

## 2019-01-15 ENCOUNTER — Other Ambulatory Visit: Payer: Self-pay

## 2019-01-15 ENCOUNTER — Ambulatory Visit: Payer: Medicare HMO | Admitting: Podiatry

## 2019-01-15 DIAGNOSIS — M79676 Pain in unspecified toe(s): Secondary | ICD-10-CM | POA: Diagnosis not present

## 2019-01-15 DIAGNOSIS — B351 Tinea unguium: Secondary | ICD-10-CM

## 2019-01-15 DIAGNOSIS — E1142 Type 2 diabetes mellitus with diabetic polyneuropathy: Secondary | ICD-10-CM

## 2019-01-15 NOTE — Progress Notes (Signed)
Subjective:  Patient ID: Connor Aguilar, male    DOB: [birth year],  MRN: [id number] HPI Chief Complaint  Patient presents with  . Foot Pain    Forefoot right - aching x 2 months, tree fell on foot 15 years ago and crushed foot  . Nail Problem    Toenails long and thick - requesting trim  . Diabetes    Last a1c was 7.1  . New Patient (Initial Visit)    78 y.o. male presents with the above complaint.   ROS: Denies fever chills nausea vomiting muscle aches pains calf pain back pain chest pain shortness of breath.  Past Medical History:  Diagnosis Date  . Allergy   . Diabetes mellitus without complication ([place])   . Hyperlipidemia   . Hypertension   . Tobacco abuse    Past Surgical History:  Procedure Laterality Date  . FOOT SURGERY      Current Outpatient Medications:  .  acetaminophen (TYLENOL) 325 MG tablet, Take 2 tablets (650 mg total) by mouth every 6 (six) hours as needed for mild pain (or Fever >/= 101)., Disp: , Rfl:  .  amLODipine (NORVASC) 5 MG tablet, TAKE ONE (1) TABLET EACH DAY, Disp: 90 tablet, Rfl: 1 .  blood glucose meter kit and supplies, Accuchek or whatever is covered by insurance; check sugars once a day only if desired; Dx E11.65, LON 99 months, Disp: 1 each, Rfl: 0 .  glucose blood test strip, Use as instructed to check sugars once a day if desired; LON 99 months, Dx E11.65, Disp: 100 each, Rfl: 3 .  irbesartan-hydrochlorothiazide (AVALIDE) 150-12.5 MG tablet, Take 1 tablet by mouth daily., Disp: 90 tablet, Rfl: 0 .  metFORMIN (GLUCOPHAGE) 1000 MG tablet, Take 1 tablet (1,000 mg total) by mouth 2 (two) times daily with a meal., Disp: 180 tablet, Rfl: 0 .  rosuvastatin (CRESTOR) 5 MG tablet, TAKE ONE (1) TABLET AT BEDTIME, Disp: 90 tablet, Rfl: 1 .  sitaGLIPtin (JANUVIA) 100 MG tablet, Take 1 tablet (100 mg total) by mouth daily., Disp: 90 tablet, Rfl: 0  No Known Allergies Review of Systems Objective:  There were no vitals filed for this visit.   General: Well developed, nourished, in no acute distress, alert and oriented x3   Dermatological: Skin is warm, dry and supple bilateral. Nails x 10 are well maintained; remaining integument appears unremarkable at this time. There are no open sores, no preulcerative lesions, no rash or signs of infection present.  Toenails are thick yellow dystrophic onychomycotic painful palpation sharp incurvated nail margins.  Vascular: Dorsalis Pedis artery and Posterior Tibial artery pedal pulses are 2/4 bilateral with immedate capillary fill time. Pedal hair growth present. No varicosities and no lower extremity edema present bilateral.   Neruologic: Grossly intact via light touch bilateral. Vibratory intact via tuning fork bilateral. Protective threshold with Semmes Wienstein monofilament intact to all pedal sites bilateral. Patellar and Achilles deep tendon reflexes 2+ bilateral. No Babinski or clonus noted bilateral.   Musculoskeletal: No gross boney pedal deformities bilateral. No pain, crepitus, or limitation noted with foot and ankle range of motion bilateral. Muscular strength 5/5 in all groups tested bilateral.  Gait: Unassisted, Nonantalgic.    Radiographs:  Radiographs taken today demonstrate osteoarthritis Lisfranc's joints right foot.  Normal osseous architecture left foot no acute findings.  Assessment & Plan:   Assessment: Osteoarthritis right foot Lisfranc's joints.  Painful mycotic nails.  Plan: Offered him an injection today he declined.  Debrided toenails 1 through 5 bilateral  covered service secondary to pain.     Brandon Wiechman T. [place], [place]

## 2019-02-05 NOTE — Progress Notes (Signed)
This encounter was created in error - please disregard.

## 2019-02-07 ENCOUNTER — Other Ambulatory Visit: Payer: Self-pay | Admitting: Family Medicine

## 2019-02-07 NOTE — Telephone Encounter (Signed)
Medication Refill - Medication: sitaGLIPtin (JANUVIA) 100 MG tablet  Pt states he has 2-3 doses left.   Preferred Pharmacy:  [facility], [place] - [place] [phone] (Phone) [phone] (Fax)     Pt was advised that RX refills may take up to 3 business days. We ask that you follow-up with your pharmacy.

## 2019-02-12 ENCOUNTER — Encounter: Payer: Self-pay | Admitting: *Deleted

## 2019-03-05 ENCOUNTER — Ambulatory Visit: Payer: Medicare HMO | Admitting: Family Medicine

## 2019-03-22 ENCOUNTER — Other Ambulatory Visit: Payer: Self-pay | Admitting: Family Medicine

## 2019-03-22 DIAGNOSIS — I10 Essential (primary) hypertension: Secondary | ICD-10-CM

## 2019-03-22 MED ORDER — AMLODIPINE BESYLATE 5 MG PO TABS: ORAL_TABLET | ORAL | 1 refills | Status: DC

## 2019-03-22 NOTE — Telephone Encounter (Signed)
Medication: amLODipine (NORVASC) 5 MG tablet   Patient is requesting a refill    Pharmacy:  [facility], [place] - [place] [phone] (Phone) [phone] (Fax)

## 2019-03-27 ENCOUNTER — Telehealth: Payer: Self-pay | Admitting: Family Medicine

## 2019-03-27 ENCOUNTER — Other Ambulatory Visit: Payer: Self-pay | Admitting: Nurse Practitioner

## 2019-03-27 DIAGNOSIS — I10 Essential (primary) hypertension: Secondary | ICD-10-CM

## 2019-03-27 MED ORDER — IRBESARTAN-HYDROCHLOROTHIAZIDE 150-12.5 MG PO TABS: 1.0000 | ORAL_TABLET | Freq: Every day | ORAL | 0 refills | Status: DC

## 2019-03-27 NOTE — Telephone Encounter (Signed)
Patient requesting refill irbesartan-hydrochlorothiazide (AVALIDE) 150-12.5 MG tablet, patient states he's completely, out    [facility], [place] [phone] (Phone) [phone] (Fax)

## 2019-03-27 NOTE — Telephone Encounter (Signed)
Pt has a telephone visit in the morning at 10 with Connor Aguilar and wants to know if this can be called in today? He states he has never done without his meds and hasnt had any for today.

## 2019-03-28 ENCOUNTER — Other Ambulatory Visit: Payer: Self-pay

## 2019-03-28 ENCOUNTER — Ambulatory Visit (INDEPENDENT_AMBULATORY_CARE_PROVIDER_SITE_OTHER): Payer: Medicare HMO | Admitting: Nurse Practitioner

## 2019-03-28 ENCOUNTER — Encounter: Payer: Self-pay | Admitting: Nurse Practitioner

## 2019-03-28 DIAGNOSIS — N528 Other male erectile dysfunction: Secondary | ICD-10-CM | POA: Diagnosis not present

## 2019-03-28 DIAGNOSIS — J431 Panlobular emphysema: Secondary | ICD-10-CM

## 2019-03-28 DIAGNOSIS — E1169 Type 2 diabetes mellitus with other specified complication: Secondary | ICD-10-CM

## 2019-03-28 DIAGNOSIS — Z720 Tobacco use: Secondary | ICD-10-CM

## 2019-03-28 DIAGNOSIS — E782 Mixed hyperlipidemia: Secondary | ICD-10-CM | POA: Diagnosis not present

## 2019-03-28 DIAGNOSIS — I10 Essential (primary) hypertension: Secondary | ICD-10-CM | POA: Diagnosis not present

## 2019-03-28 DIAGNOSIS — B351 Tinea unguium: Secondary | ICD-10-CM | POA: Diagnosis not present

## 2019-03-28 MED ORDER — METFORMIN HCL 1000 MG PO TABS: 1000.0000 mg | ORAL_TABLET | Freq: Two times a day (BID) | ORAL | 1 refills | Status: DC

## 2019-03-28 MED ORDER — IRBESARTAN-HYDROCHLOROTHIAZIDE 150-12.5 MG PO TABS: 1.0000 | ORAL_TABLET | Freq: Every day | ORAL | 1 refills | Status: DC

## 2019-03-28 MED ORDER — SITAGLIPTIN PHOSPHATE 100 MG PO TABS: 100.0000 mg | ORAL_TABLET | Freq: Every day | ORAL | 1 refills | Status: DC

## 2019-03-28 MED ORDER — ROSUVASTATIN CALCIUM 5 MG PO TABS: ORAL_TABLET | ORAL | 1 refills | Status: DC

## 2019-03-28 NOTE — Progress Notes (Signed)
Virtual Visit via Telephone Note  I connected with Connor Aguilar  on 03/28/19 at 10:00 AM EDT by telephone and verified that I am speaking with the correct person using two identifiers.   Staff discussed the limitations, risks, security and privacy concerns of performing an evaluation and management service by telephone and the availability of in person appointments. Staff also discussed with the patient that there may be a patient responsible charge related to this service. The patient expressed understanding and agreed to proceed.  Patients location: home My location: work office  Other people in meeting: none    HPI  Essential hypertension Patient is prescribed amlodipine 5 mg daily, irbesartan 150 mg daily and HCTZ 12.5 mg daily.  He takes these medications every day as prescribed with no missed doses.  He denies headaches, blurry vision, chest pain, lightheadedness. He has associated erectile dysfunction.  Not on medication for this currently. BP Readings from Last 3 Encounters:  09/27/18 124/78  05/16/18 138/84  12/13/17 140/76    Panlobular emphysema  Patient has 128-pack-year smoking history currently smoking 2 packs a day. He is not on any inhalers.  Endorses chronic productive cough. Denies shortness of breath. Screening CT for lung cancer ordered in 2019 but patient never completed this- wants to hold on this for now due to pandemic.   Type 2 diabetes mellitus  Patient is prescribed metformin 1000 mg twice daily and Januvia 100 mg daily.  He takes his medications every day as prescribed with no missed doses.  He denies polyphagia, polydipsia, polyuria. He does not checks his blood sugars. He is on a statin and are both for nephro protection. Last ophthalmology appointment was on 01/05/2019 Patient has onychomycosis of the toenails, last seen podiatry- 01/15/2019 Lab Results  Component Value Date   HGBA1C 7.1 (H) 09/27/2018    Mixed hyperlipidemia Patient is  prescribed Crestor 5 mg night.  He takes his medications every day with no missed doses.  Denies myalgias. Diet: Eats red meat times a 1-2 week, eats fried food 4-5times a week Lab Results  Component Value Date   CHOL 94 09/27/2018   HDL 44 09/27/2018   LDLCALC 25 09/27/2018   TRIG 167 (H) 09/27/2018   CHOLHDL 2.1 09/27/2018       PHQ2/9: Depression screen [facility] 2/9 12/04/2018 09/27/2018 05/16/2018 12/13/2017 08/31/2017  Decreased Interest 0 0 0 0 0  Down, Depressed, Hopeless 0 0 0 0 0  PHQ - 2 Score 0 0 0 0 0  Altered sleeping 0 0 0 - -  Tired, decreased energy 0 0 0 - -  Change in appetite 0 0 0 - -  Feeling bad or failure about yourself  0 0 0 - -  Trouble concentrating 0 0 0 - -  Moving slowly or fidgety/restless 0 0 0 - -  Suicidal thoughts 0 0 0 - -  PHQ-9 Score 0 0 0 - -  Difficult doing work/chores Not difficult at all Not difficult at all Not difficult at all - -     PHQ reviewed. Negative  Patient Active Problem List   Diagnosis Date Noted  . Onychomycosis of multiple toenails with type 2 diabetes mellitus ([place]) 05/16/2018  . Recurrent productive cough 01/28/2016  . Elevated CPK 11/18/2015  . Erectile dysfunction 08/19/2015  . Tobacco abuse 04/09/2015  . Simple chronic bronchitis ([place]) 04/03/2015  . Hyperlipidemia 04/02/2015  . Essential hypertension 04/02/2015  . Panlobular emphysema ([place]) 04/02/2015    Past Medical History:  Diagnosis  Date  . Allergy   . Diabetes mellitus without complication ([place])   . Hyperlipidemia   . Hypertension   . Tobacco abuse     Past Surgical History:  Procedure Laterality Date  . FOOT SURGERY      Social History   Tobacco Use  . Smoking status: Current Every Day Smoker    Packs/day: 2.00    Years: 64.00    Pack years: 128.00    Types: Cigarettes  . Smokeless tobacco: Never Used  Substance Use Topics  . Alcohol use: No    Alcohol/week: 0.0 standard drinks     Current Outpatient Medications:  .  acetaminophen  (TYLENOL) 325 MG tablet, Take 2 tablets (650 mg total) by mouth every 6 (six) hours as needed for mild pain (or Fever >/= 101)., Disp: , Rfl:  .  amLODipine (NORVASC) 5 MG tablet, TAKE ONE (1) TABLET EACH DAY, Disp: 90 tablet, Rfl: 1 .  blood glucose meter kit and supplies, Accuchek or whatever is covered by insurance; check sugars once a day only if desired; Dx E11.65, LON 99 months, Disp: 1 each, Rfl: 0 .  glucose blood test strip, Use as instructed to check sugars once a day if desired; LON 99 months, Dx E11.65, Disp: 100 each, Rfl: 3 .  irbesartan-hydrochlorothiazide (AVALIDE) 150-12.5 MG tablet, Take 1 tablet by mouth daily., Disp: 90 tablet, Rfl: 0 .  JANUVIA 100 MG tablet, TAKE (1) TABLET BY MOUTH EVERY DAY, Disp: 90 tablet, Rfl: 0 .  metFORMIN (GLUCOPHAGE) 1000 MG tablet, Take 1 tablet (1,000 mg total) by mouth 2 (two) times daily with a meal., Disp: 180 tablet, Rfl: 0 .  rosuvastatin (CRESTOR) 5 MG tablet, TAKE ONE (1) TABLET AT BEDTIME, Disp: 90 tablet, Rfl: 1  No Known Allergies  Review of Systems  Constitutional: Negative for chills, fever and malaise/fatigue.  Respiratory: Negative for cough and shortness of breath.   Cardiovascular: Negative for chest pain, palpitations and leg swelling.  Gastrointestinal: Negative for abdominal pain.  Musculoskeletal: Negative for joint pain and myalgias.  Skin: Negative for rash.  Neurological: Negative for dizziness and headaches.  Psychiatric/Behavioral: The patient is not nervous/anxious and does not have insomnia.       No other specific complaints in a complete review of systems (except as listed in HPI above).  Objective  There were no vitals filed for this visit.   There is no height or weight on file to calculate BMI.  Patient is alert, able to speak in full sentences without difficulty.   Assessment & Plan 1. Essential hypertension stable - irbesartan-hydrochlorothiazide (AVALIDE) 150-12.5 MG tablet; Take 1 tablet by  mouth daily.  Dispense: 90 tablet; Refill: 1  2. Panlobular emphysema ([place]) Discussed quit smoking, not ready   3. Onychomycosis of multiple toenails with type 2 diabetes mellitus (HCC) - sitaGLIPtin (JANUVIA) 100 MG tablet; Take 1 tablet (100 mg total) by mouth daily.  Dispense: 90 tablet; Refill: 1 - metFORMIN (GLUCOPHAGE) 1000 MG tablet; Take 1 tablet (1,000 mg total) by mouth 2 (two) times daily with a meal.  Dispense: 180 tablet; Refill: 1  4. Mixed hyperlipidemia - rosuvastatin (CRESTOR) 5 MG tablet; TAKE ONE (1) TABLET AT BEDTIME  Dispense: 90 tablet; Refill: 1  5. Other male erectile dysfunction Not on medication, states I get by   6. Tobacco abuse Discussed smoking cessation      I discussed the assessment and treatment plan with the patient. The patient was provided an opportunity to ask questions  and all were answered. The patient agreed with the plan and demonstrated an understanding of the instructions.   The patient was advised to call back or seek an in-person evaluation if the symptoms worsen or if the condition fails to improve as anticipated.  I provided 21 minutes of non-face-to-face time during this encounter including chart review.   Fredderick Severance, NP

## 2019-04-19 ENCOUNTER — Ambulatory Visit: Payer: Medicare HMO | Admitting: Podiatry

## 2019-04-26 ENCOUNTER — Encounter: Payer: Self-pay | Admitting: Podiatry

## 2019-04-26 ENCOUNTER — Ambulatory Visit (INDEPENDENT_AMBULATORY_CARE_PROVIDER_SITE_OTHER): Payer: Medicare HMO | Admitting: Podiatry

## 2019-04-26 ENCOUNTER — Other Ambulatory Visit: Payer: Self-pay

## 2019-04-26 DIAGNOSIS — B351 Tinea unguium: Secondary | ICD-10-CM

## 2019-04-26 DIAGNOSIS — E1142 Type 2 diabetes mellitus with diabetic polyneuropathy: Secondary | ICD-10-CM

## 2019-04-26 DIAGNOSIS — M79676 Pain in unspecified toe(s): Secondary | ICD-10-CM

## 2019-04-26 NOTE — Progress Notes (Signed)
Complaint:  Visit Type: Patient returns to my office for continued preventative foot care services. Complaint: Patient states" my nails have grown long and thick and become painful to walk and wear shoes" Patient has been diagnosed with DM with no foot complications. The patient presents for preventative foot care services. No changes to ROS  Podiatric Exam: Vascular: dorsalis pedis and posterior tibial pulses are palpable bilateral. Capillary return is immediate. Temperature gradient is WNL. Skin turgor WNL  Sensorium: Normal Semmes Weinstein monofilament test. Normal tactile sensation bilaterally. Nail Exam: Pt has thick disfigured discolored nails with subungual debris noted bilateral entire nail hallux through fifth toenails Ulcer Exam: There is no evidence of ulcer or pre-ulcerative changes or infection. Orthopedic Exam: Muscle tone and strength are WNL. No limitations in general ROM. No crepitus or effusions noted. Foot type and digits show no abnormalities. Bony prominences are unremarkable. 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 Skin: No Porokeratosis. No infection or ulcers  Diagnosis:  Onychomycosis, , Pain in right toe, pain in left toes  Treatment & Plan Procedures and Treatment: Consent by patient was obtained for treatment procedures.   Debridement of mycotic and hypertrophic toenails, 1 through 5 bilateral and clearing of subungual debris. No ulceration, no infection noted.  Return Visit-Office Procedure: Patient instructed to return to the office for a follow up visit 10 weeks for continued evaluation and treatment.    Gardiner Barefoot DPM

## 2019-05-15 ENCOUNTER — Telehealth: Payer: Self-pay | Admitting: Family Medicine

## 2019-05-15 NOTE — Telephone Encounter (Signed)
error 

## 2019-06-15 ENCOUNTER — Ambulatory Visit (INDEPENDENT_AMBULATORY_CARE_PROVIDER_SITE_OTHER): Payer: Medicare HMO

## 2019-06-15 DIAGNOSIS — Z0000 Encounter for general adult medical examination without abnormal findings: Secondary | ICD-10-CM | POA: Diagnosis not present

## 2019-06-15 NOTE — Progress Notes (Signed)
Subjective:   Connor Aguilar is a 78 y.o. male who presents for an Initial Medicare Annual Wellness Visit.  Virtual Visit via Telephone Note  I connected with [name] on 06/15/19 at 10:00 AM EST by telephone and verified that I am speaking with the correct person using two identifiers.  Medicare Annual Wellness visit completed telephonically due to Covid-19 pandemic.   Location: Patient: home Provider: office   I discussed the limitations, risks, security and privacy concerns of performing an evaluation and management service by telephone and the availability of in person appointments. The patient expressed understanding and agreed to proceed.  Some vital signs may be absent or patient reported.   Clemetine Marker, LPN    Review of Systems   Cardiac Risk Factors include: advanced age (>31mn, >>65women);diabetes mellitus;dyslipidemia;hypertension;male gender;smoking/ tobacco exposure    Objective:    There were no vitals filed for this visit. There is no height or weight on file to calculate BMI.  Advanced Directives 06/15/2019 06/18/2017 05/19/2017 02/01/2017 07/07/2016 06/24/2016 05/26/2016  Does Patient Have a Medical Advance Directive? No No No No Yes Yes No  Type of Advance Directive - - - - Living will Living will -  Does patient want to make changes to medical advance directive? - - - - - - -  [place]? - - - - - - -  Would patient like information on creating a medical advance directive? No - Patient declined No - Patient declined - - - - No - patient declined information    Current Medications (verified) Outpatient Encounter Medications as of 06/15/2019  Medication Sig  . acetaminophen (TYLENOL) 325 MG tablet Take 2 tablets (650 mg total) by mouth every 6 (six) hours as needed for mild pain (or Fever >/= 101).  .Marland KitchenamLODipine (NORVASC) 5 MG tablet TAKE ONE (1) TABLET EACH DAY  . blood glucose meter kit and supplies Accuchek or  whatever is covered by insurance; check sugars once a day only if desired; Dx E11.65, LON 99 months  . glucose blood test strip Use as instructed to check sugars once a day if desired; LON 99 months, Dx E11.65  . irbesartan-hydrochlorothiazide (AVALIDE) 150-12.5 MG tablet Take 1 tablet by mouth daily.  . metFORMIN (GLUCOPHAGE) 1000 MG tablet Take 1 tablet (1,000 mg total) by mouth 2 (two) times daily with a meal.  . rosuvastatin (CRESTOR) 5 MG tablet TAKE ONE (1) TABLET AT BEDTIME  . sitaGLIPtin (JANUVIA) 100 MG tablet Take 1 tablet (100 mg total) by mouth daily.   No facility-administered encounter medications on file as of 06/15/2019.     Allergies (verified) Patient has no known allergies.   History: Past Medical History:  Diagnosis Date  . Allergy   . [place]   . Elevated CPK 11/18/2015  . Hyperlipidemia   . Hypertension   . Tobacco abuse    Past Surgical History:  Procedure Laterality Date  . FOOT SURGERY     Family History  Problem Relation Age of Onset  . Diabetes Mother    Social History   Socioeconomic History  . Marital status: Widowed    Spouse name: Not on file  . Number of children: 2  . Years of education: Not on file  . Highest education level: Not on file  Occupational History  . Not on file  Social Needs  . Financial resource strain: Not hard at all  . Food insecurity  Worry: Never true    Inability: Never true  . Transportation needs    Medical: No    Non-medical: No  Tobacco Use  . Smoking status: Current Every Day Smoker    Packs/day: 2.00    Years: 64.00    Pack years: 128.00    Types: Cigarettes  . Smokeless tobacco: Never Used  Substance and Sexual Activity  . Alcohol use: No    Alcohol/week: 0.0 standard drinks  . Drug use: No  . Sexual activity: Never  Lifestyle  . Physical activity    Days per week: 0 days    Minutes per session: 0 min  . Stress: Not at all  Relationships  . Social  Herbalist on phone: Patient refused    Gets together: Patient refused    Attends religious service: Patient refused    Active member of club or organization: Patient refused    Attends meetings of clubs or organizations: Patient refused    Relationship status: Widowed  Other Topics Concern  . Not on file  Social History Narrative   Pt wife passed away September 24, 2014. Lives between his two sons.    Tobacco Counseling Ready to quit: No Counseling given: Not Answered   Clinical Intake:  Pre-visit preparation completed: Yes  Pain : No/denies pain     Nutritional Risks: None Diabetes: Yes CBG done?: No Did pt. bring in CBG monitor from home?: No   Nutrition Risk Assessment:  Has the patient had any N/V/D within the last 2 months?  No  Does the patient have any non-healing wounds?  No  Has the patient had any unintentional weight loss or weight gain?  No   Diabetes:  Is the patient diabetic?  Yes  If diabetic, was a CBG obtained today?  No  Did the patient bring in their glucometer from home?  No  How often do you monitor your CBG's? Every so often per patient.   Financial Strains and Diabetes Management:  Are you having any financial strains with the device, your supplies or your medication? No .  Does the patient want to be seen by Chronic Care Management for management of their diabetes?  No  Would the patient like to be referred to a Nutritionist or for Diabetic Management?  No   Diabetic Exams:  Diabetic Eye Exam: Completed 01/05/19 negative retinopathy.   Diabetic Foot Exam: Completed 09/27/18.   How often do you need to have someone help you when you read instructions, pamphlets, or other written materials from your doctor or pharmacy?: 1 - Never  Interpreter Needed?: No  Information entered by :: Clemetine Marker LPN  Activities of Daily Living In your present state of health, do you have any difficulty performing the following activities: 06/15/2019 03/28/2019   Hearing? N N  Comment declines hearing aids -  Vision? N N  Difficulty concentrating or making decisions? N N  Walking or climbing stairs? N N  Dressing or bathing? N N  Doing errands, shopping? N N  Preparing Food and eating ? N -  Using the Toilet? N -  In the past six months, have you accidently leaked urine? N -  Do you have problems with loss of bowel control? N -  Managing your Medications? N -  Managing your Finances? N -  Housekeeping or managing your Housekeeping? N -  Some recent data might be hidden     Immunizations and Health Maintenance Immunization History  Administered Date(s) Administered  .  Tdap 07/27/2015   Health Maintenance Due  Topic Date Due  . INFLUENZA VACCINE  03/10/2019  . HEMOGLOBIN A1C  03/28/2019    Patient Care Team: Delsa Grana, PA-C as PCP - General (Family Medicine)  Indicate any recent Medical Services you may have received from other than [facility] providers in the past year (date may be approximate).    Assessment:   This is a routine wellness examination for [place].  Hearing/Vision screen  Hearing Screening   _0  _1  _2  _3  _4  _5  _6  _7  _8   Right ear:           Left ear:           Comments: Pt denies hearing difficulty  Vision Screening Comments: [facility] screen[facility] at [facility]  Dietary issues and exercise activities discussed: Current Exercise Habits: The patient does not participate in regular exercise at present, Exercise limited by: None identified  Goals   None    Depression Screen PHQ 2/9 Scores 06/15/2019 03/28/2019 12/04/2018 09/27/2018  PHQ - 2 Score 2 0 0 0  PHQ- 9 Score 2 0 0 0    Fall Risk Fall Risk  06/15/2019 03/28/2019 12/04/2018 09/27/2018 05/16/2018  Falls in the past year? 0 0 0 0 No  Number falls in past yr: 0 0 - 0 -  Injury with Fall? 0 0 - 0 -  Risk for fall due to : - - - - -  Risk for fall due to: Comment - - - - -  Follow up Falls prevention discussed - -  - -   FALL RISK PREVENTION PERTAINING TO THE HOME:  Any stairs in or around the home? Yes  If so, do they handrails? Yes   Home free of loose throw rugs in walkways, pet beds, electrical cords, etc? Yes Adequate lighting in your home to reduce risk of falls? Yes   ASSISTIVE DEVICES UTILIZED TO PREVENT FALLS:  Life alert? No  Use of a cane, walker or w/c? No  Grab bars in the bathroom? No  Shower chair or bench in shower? No  Elevated toilet seat or a handicapped toilet? No   DME ORDERS:  DME order needed?  No   TIMED UP AND GO:  Was the test performed? No . Telephonic visit.   Education: Fall risk prevention has been discussed.  Intervention(s) required? No   Cognitive Function:     6CIT Screen 06/15/2019  What Year? 0 points  What month? 0 points  What time? 0 points  Count back from 20 0 points  Months in reverse 4 points  Repeat phrase 2 points  Total Score 6    Screening Tests Health Maintenance  Topic Date Due  . INFLUENZA VACCINE  03/10/2019  . HEMOGLOBIN A1C  03/28/2019  . PNA vac Low Risk Adult (1 of 2 - PCV13) 09/28/2019 (Originally 12/20/2003)  . FOOT EXAM  09/28/2019  . OPHTHALMOLOGY EXAM  01/05/2020  . TETANUS/TDAP  07/26/2025    Qualifies for Shingles Vaccine? Yes . Due for Shingrix. Education has been provided regarding the importance of this vaccine. Pt has been advised to call insurance company to determine out of pocket expense. Advised may also receive vaccine at local pharmacy or Health Dept. Verbalized acceptance and understanding.  Tdap: Up to date  Flu Vaccine: Due for Flu vaccine. Does the patient want to receive this vaccine today?  No . Education has been provided regarding the importance of this vaccine but still declined. Advised may receive  this vaccine at local pharmacy or Health Dept. Aware to provide a copy of the vaccination record if obtained from local pharmacy or Health Dept. Verbalized acceptance and understanding.   Pneumococcal Vaccine: Due for Pneumococcal vaccine. Does the patient want to receive this vaccine today?  No . Education has been provided regarding the importance of this vaccine but still declined. Advised may receive this vaccine at local pharmacy or Health Dept. Aware to provide a copy of the vaccination record if obtained from local pharmacy or Health Dept. Verbalized acceptance and understanding.  Cancer Screenings:  Colorectal Screening: No longer required.   Lung Cancer Screening: (Low Dose CT Chest recommended if Age 67-80 years, 30 pack-year currently smoking OR have quit w/in 15years.) does qualify.   Lung Cancer Screening Referral: An Epic message has been sent to Burgess Estelle, RN (Oncology Nurse Navigator) regarding the possible need for this exam. Raquel Sarna will review the patient's chart to determine if the patient truly qualifies for the exam. If the patient qualifies, Raquel Sarna will order the Low Dose CT of the chest to facilitate the scheduling of this exam.  Additional Screening:  Hepatitis C Screening: no longer required  Vision Screening: Recommended annual ophthalmology exams for early detection of glaucoma and other disorders of the eye. Is the patient up to date with their annual eye exam?  Yes  Who is the provider or what is the name of the office in which the pt attends annual eye exams? [place] Screening: Recommended annual dental exams for proper oral hygiene  Community Resource Referral:  CRR required this visit?  No       Plan:    I have personally reviewed and addressed the Medicare Annual Wellness questionnaire and have noted the following in the patient's chart:  A. Medical and social history B. Use of alcohol, tobacco or illicit drugs  C. Current medications and supplements D. Functional ability and status E.  Nutritional status F.  Physical activity G. Advance directives H. List of other physicians I.  Hospitalizations, surgeries,  and ER visits in previous 12 months J.  [place] such as hearing and vision if needed, cognitive and depression L. Referrals and appointments   In addition, I have reviewed and discussed with patient certain preventive protocols, quality metrics, and best practice recommendations. A written personalized care plan for preventive services as well as general preventive health recommendations were provided to patient.   Signed,  Clemetine Marker, LPN Nurse Health Advisor   Nurse Notes: advised pt due for 3 month f/u; scheduled with Delsa Grana PAC for 06/28/19. pt agreed to get additional information on lung cancer screening as he did not follow up in 2019.

## 2019-06-15 NOTE — Patient Instructions (Addendum)
Mr. Connor Aguilar , Thank you for taking time to come for your Medicare Wellness Visit. I appreciate your ongoing commitment to your health goals. Please review the following plan we discussed and let me know if I can assist you in the future.   Screening recommendations/referrals: Colonoscopy: no longer required Recommended yearly ophthalmology/optometry visit for glaucoma screening and checkup Recommended yearly dental visit for hygiene and checkup  Vaccinations: Influenza vaccine: postponed Pneumococcal vaccine: postponed Tdap vaccine: done 07/27/15 Shingles vaccine: Shingrix discussed. Please contact your pharmacy for coverage information.   Advanced directives: Advance directive discussed with you today. Even though you declined this today please call our office should you change your mind and we can give you the proper paperwork for you to fill out.  Conditions/risks identified: If you wish to quit smoking, help is available. For free tobacco cessation program offerings call the [facility] at 754-888-6359 or Live Well Line at [phone]. You may also visit [email or web address] or email [email or web address] for more information on other programs.   Next appointment: Please follow up in one year for your Medicare Annual Wellness visit.    Preventive Care 52 Years and Older, Male Preventive care refers to lifestyle choices and visits with your health care provider that can promote health and wellness. What does preventive care include?  A yearly physical exam. This is also called an annual well check.  Dental exams once or twice a year.  Routine eye exams. Ask your health care provider how often you should have your eyes checked.  Personal lifestyle choices, including:  Daily care of your teeth and gums.  Regular physical activity.  Eating a healthy diet.  Avoiding tobacco and drug use.  Limiting alcohol use.  Practicing safe sex.  Taking low doses of  aspirin every day.  Taking vitamin and mineral supplements as recommended by your health care provider. What happens during an annual well check? The services and screenings done by your health care provider during your annual well check will depend on your age, overall health, lifestyle risk factors, and family history of disease. Counseling  Your health care provider may ask you questions about your:  Alcohol use.  Tobacco use.  Drug use.  Emotional well-being.  Home and relationship well-being.  Sexual activity.  Eating habits.  History of falls.  Memory and ability to understand (cognition).  Work and work Statistician. Screening  You may have the following tests or measurements:  Height, weight, and BMI.  Blood pressure.  Lipid and cholesterol levels. These may be checked every 5 years, or more frequently if you are over 69 years old.  Skin check.  Lung cancer screening. You may have this screening every year starting at age 54 if you have a 30-pack-year history of smoking and currently smoke or have quit within the past 15 years.  Fecal occult blood test (FOBT) of the stool. You may have this test every year starting at age 65.  Flexible sigmoidoscopy or colonoscopy. You may have a sigmoidoscopy every 5 years or a colonoscopy every 10 years starting at age 14.  Prostate cancer screening. Recommendations will vary depending on your family history and other risks.  Hepatitis C blood test.  Hepatitis B blood test.  Sexually transmitted disease (STD) testing.  Diabetes screening. This is done by checking your blood sugar (glucose) after you have not eaten for a while (fasting). You may have this done every 1-3 years.  Abdominal aortic aneurysm (AAA) screening. You may need  this if you are a current or former smoker.  Osteoporosis. You may be screened starting at age 80 if you are at high risk. Talk with your health care provider about your test results,  treatment options, and if necessary, the need for more tests. Vaccines  Your health care provider may recommend certain vaccines, such as:  Influenza vaccine. This is recommended every year.  Tetanus, diphtheria, and acellular pertussis (Tdap, Td) vaccine. You may need a Td booster every 10 years.  Zoster vaccine. You may need this after age 63.  Pneumococcal 13-valent conjugate (PCV13) vaccine. One dose is recommended after age 44.  Pneumococcal polysaccharide (PPSV23) vaccine. One dose is recommended after age 58. Talk to your health care provider about which screenings and vaccines you need and how often you need them. This information is not intended to replace advice given to you by your health care provider. Make sure you discuss any questions you have with your health care provider. Document Released: 08/22/2015 Document Revised: 04/14/2016 Document Reviewed: 05/27/2015 Elsevier Interactive Patient Education  2017 [place] Prevention in the Home Falls can cause injuries. They can happen to people of all ages. There are many things you can do to make your home safe and to help prevent falls. What can I do on the outside of my home?  Regularly fix the edges of walkways and driveways and fix any cracks.  Remove anything that might make you trip as you walk through a door, such as a raised step or threshold.  Trim any bushes or trees on the path to your home.  Use bright outdoor lighting.  Clear any walking paths of anything that might make someone trip, such as rocks or tools.  Regularly check to see if handrails are loose or broken. Make sure that both sides of any steps have handrails.  Any raised decks and porches should have guardrails on the edges.  Have any leaves, snow, or ice cleared regularly.  Use sand or salt on walking paths during winter.  Clean up any spills in your garage right away. This includes oil or grease spills. What can I do in the  bathroom?  Use night lights.  Install grab bars by the toilet and in the tub and shower. Do not use towel bars as grab bars.  Use non-skid mats or decals in the tub or shower.  If you need to sit down in the shower, use a plastic, non-slip stool.  Keep the floor dry. Clean up any water that spills on the floor as soon as it happens.  Remove soap buildup in the tub or shower regularly.  Attach bath mats securely with double-sided non-slip rug tape.  Do not have throw rugs and other things on the floor that can make you trip. What can I do in the bedroom?  Use night lights.  Make sure that you have a light by your bed that is easy to reach.  Do not use any sheets or blankets that are too big for your bed. They should not hang down onto the floor.  Have a firm chair that has side arms. You can use this for support while you get dressed.  Do not have throw rugs and other things on the floor that can make you trip. What can I do in the kitchen?  Clean up any spills right away.  Avoid walking on wet floors.  Keep items that you use a lot in easy-to-reach places.  If you  need to reach something above you, use a strong step stool that has a grab bar.  Keep electrical cords out of the way.  Do not use floor polish or wax that makes floors slippery. If you must use wax, use non-skid floor wax.  Do not have throw rugs and other things on the floor that can make you trip. What can I do with my stairs?  Do not leave any items on the stairs.  Make sure that there are handrails on both sides of the stairs and use them. Fix handrails that are broken or loose. Make sure that handrails are as long as the stairways.  Check any carpeting to make sure that it is firmly attached to the stairs. Fix any carpet that is loose or worn.  Avoid having throw rugs at the top or bottom of the stairs. If you do have throw rugs, attach them to the floor with carpet tape.  Make sure that you have a  light switch at the top of the stairs and the bottom of the stairs. If you do not have them, ask someone to add them for you. What else can I do to help prevent falls?  Wear shoes that:  Do not have high heels.  Have rubber bottoms.  Are comfortable and fit you well.  Are closed at the toe. Do not wear sandals.  If you use a stepladder:  Make sure that it is fully opened. Do not climb a closed stepladder.  Make sure that both sides of the stepladder are locked into place.  Ask someone to hold it for you, if possible.  Clearly mark and make sure that you can see:  Any grab bars or handrails.  First and last steps.  Where the edge of each step is.  Use tools that help you move around (mobility aids) if they are needed. These include:  Canes.  Walkers.  Scooters.  Crutches.  Turn on the lights when you go into a dark area. Replace any light bulbs as soon as they burn out.  Set up your furniture so you have a clear path. Avoid moving your furniture around.  If any of your floors are uneven, fix them.  If there are any pets around you, be aware of where they are.  Review your medicines with your doctor. Some medicines can make you feel dizzy. This can increase your chance of falling. Ask your doctor what other things that you can do to help prevent falls. This information is not intended to replace advice given to you by your health care provider. Make sure you discuss any questions you have with your health care provider. Document Released: 05/22/2009 Document Revised: 01/01/2016 Document Reviewed: 08/30/2014 Elsevier Interactive Patient Education  2017 [employer].

## 2019-06-25 ENCOUNTER — Telehealth: Payer: Self-pay | Admitting: *Deleted

## 2019-06-25 NOTE — Telephone Encounter (Signed)
Received referral for lung cancer screening. Attempted to contact to schedule lung screening scan. However, there is no answer or voicemail option.

## 2019-06-28 ENCOUNTER — Ambulatory Visit: Payer: Medicare HMO | Admitting: Family Medicine

## 2019-07-04 ENCOUNTER — Telehealth: Payer: Self-pay | Admitting: *Deleted

## 2019-07-04 NOTE — Telephone Encounter (Signed)
Received referral for low dose lung cancer screening CT scan. Message left at phone number listed in EMR for patient to call me back to facilitate scheduling scan.  

## 2019-07-09 ENCOUNTER — Ambulatory Visit: Payer: Medicare HMO | Admitting: Family Medicine

## 2019-07-12 ENCOUNTER — Ambulatory Visit: Payer: Medicare HMO | Admitting: Podiatry

## 2019-07-19 ENCOUNTER — Encounter: Payer: Self-pay | Admitting: Podiatry

## 2019-07-19 ENCOUNTER — Other Ambulatory Visit: Payer: Self-pay

## 2019-07-19 ENCOUNTER — Ambulatory Visit (INDEPENDENT_AMBULATORY_CARE_PROVIDER_SITE_OTHER): Payer: Medicare HMO | Admitting: Podiatry

## 2019-07-19 DIAGNOSIS — E1142 Type 2 diabetes mellitus with diabetic polyneuropathy: Secondary | ICD-10-CM | POA: Diagnosis not present

## 2019-07-19 DIAGNOSIS — M79676 Pain in unspecified toe(s): Secondary | ICD-10-CM | POA: Diagnosis not present

## 2019-07-19 DIAGNOSIS — B351 Tinea unguium: Secondary | ICD-10-CM

## 2019-07-19 NOTE — Progress Notes (Signed)
Complaint:  Visit Type: Patient returns to my office for continued preventative foot care services. Complaint: Patient states" my nails have grown long and thick and become painful to walk and wear shoes" Patient has been diagnosed with DM with no foot complications. The patient presents for preventative foot care services. No changes to ROS.  Patient says his right foot hurts all over from his midfoot through his toes.  Podiatric Exam: Vascular: dorsalis pedis and posterior tibial pulses are palpable bilateral. Capillary return is immediate. Temperature gradient is WNL. Skin turgor WNL  Sensorium: Normal Semmes Weinstein monofilament test. Normal tactile sensation bilaterally. Nail Exam: Pt has thick disfigured discolored nails with subungual debris noted bilateral entire nail hallux through fifth toenails Ulcer Exam: There is no evidence of ulcer or pre-ulcerative changes or infection. Orthopedic Exam: Muscle tone and strength are WNL. No limitations in general ROM. No crepitus or effusions noted. Foot type and digits show no abnormalities. Bony prominences are unremarkable. 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 Skin: No Porokeratosis. No infection or ulcers  Diagnosis:  Onychomycosis, , Pain in right toe, pain in left toes  Treatment & Plan Procedures and Treatment: Consent by patient was obtained for treatment procedures.   Debridement of mycotic and hypertrophic toenails, 1 through 5 bilateral and clearing of subungual debris. No ulceration, no infection noted. Patient says if his right foot continues to hurt he will  Request an injection in his right foot. Return Visit-Office Procedure: Patient instructed to return to the office for a follow up visit 9 weeks for continued evaluation and treatment.    Gardiner Barefoot DPM

## 2019-07-26 ENCOUNTER — Ambulatory Visit: Payer: Medicare HMO | Admitting: Podiatry

## 2019-08-23 ENCOUNTER — Other Ambulatory Visit: Payer: Self-pay | Admitting: Family Medicine

## 2019-08-23 DIAGNOSIS — I10 Essential (primary) hypertension: Secondary | ICD-10-CM

## 2019-08-24 ENCOUNTER — Other Ambulatory Visit: Payer: Self-pay | Admitting: Family Medicine

## 2019-08-24 DIAGNOSIS — I10 Essential (primary) hypertension: Secondary | ICD-10-CM

## 2019-08-24 NOTE — Telephone Encounter (Signed)
Tried to call pt and it seems his phone may be turned off. One ring and then busy signal.

## 2019-08-24 NOTE — Telephone Encounter (Signed)
Pt needs appt

## 2019-08-24 NOTE — Telephone Encounter (Signed)
Pt scheduled for next week.

## 2019-08-29 ENCOUNTER — Ambulatory Visit: Payer: Self-pay | Admitting: Family Medicine

## 2019-09-17 ENCOUNTER — Other Ambulatory Visit: Payer: Self-pay

## 2019-09-17 DIAGNOSIS — I10 Essential (primary) hypertension: Secondary | ICD-10-CM

## 2019-09-17 DIAGNOSIS — B351 Tinea unguium: Secondary | ICD-10-CM

## 2019-09-17 DIAGNOSIS — E1169 Type 2 diabetes mellitus with other specified complication: Secondary | ICD-10-CM

## 2019-09-17 DIAGNOSIS — E782 Mixed hyperlipidemia: Secondary | ICD-10-CM

## 2019-09-17 MED ORDER — IRBESARTAN-HYDROCHLOROTHIAZIDE 150-12.5 MG PO TABS: 1.0000 | ORAL_TABLET | Freq: Every day | ORAL | 0 refills | Status: DC

## 2019-09-17 MED ORDER — SITAGLIPTIN PHOSPHATE 100 MG PO TABS: 100.0000 mg | ORAL_TABLET | Freq: Every day | ORAL | 0 refills | Status: DC

## 2019-09-17 MED ORDER — ROSUVASTATIN CALCIUM 5 MG PO TABS: ORAL_TABLET | ORAL | 0 refills | Status: DC

## 2019-09-17 MED ORDER — METFORMIN HCL 1000 MG PO TABS: 1000.0000 mg | ORAL_TABLET | Freq: Two times a day (BID) | ORAL | 0 refills | Status: DC

## 2019-09-20 ENCOUNTER — Ambulatory Visit: Payer: Medicare HMO | Admitting: Podiatry

## 2019-09-25 ENCOUNTER — Ambulatory Visit: Payer: Medicare HMO | Admitting: Family Medicine

## 2019-09-27 ENCOUNTER — Ambulatory Visit: Payer: Medicare HMO | Admitting: Podiatry

## 2019-10-04 ENCOUNTER — Ambulatory Visit: Payer: Medicare HMO | Admitting: Podiatry

## 2019-10-11 ENCOUNTER — Ambulatory Visit: Payer: Medicare HMO | Admitting: Podiatry

## 2019-10-18 ENCOUNTER — Ambulatory Visit: Payer: Medicare HMO | Admitting: Podiatry

## 2019-10-23 ENCOUNTER — Other Ambulatory Visit: Payer: Self-pay

## 2019-10-23 ENCOUNTER — Encounter: Payer: Self-pay | Admitting: Family Medicine

## 2019-10-23 ENCOUNTER — Ambulatory Visit (INDEPENDENT_AMBULATORY_CARE_PROVIDER_SITE_OTHER): Payer: Medicare HMO | Admitting: Family Medicine

## 2019-10-23 VITALS — BP 110/64 | HR 85 | Temp 97.7°F | Resp 14 | Ht 68.0 in | Wt 158.2 lb

## 2019-10-23 DIAGNOSIS — I10 Essential (primary) hypertension: Secondary | ICD-10-CM

## 2019-10-23 DIAGNOSIS — M21961 Unspecified acquired deformity of right lower leg: Secondary | ICD-10-CM | POA: Diagnosis not present

## 2019-10-23 DIAGNOSIS — Z720 Tobacco use: Secondary | ICD-10-CM | POA: Diagnosis not present

## 2019-10-23 DIAGNOSIS — E119 Type 2 diabetes mellitus without complications: Secondary | ICD-10-CM | POA: Diagnosis not present

## 2019-10-23 DIAGNOSIS — J431 Panlobular emphysema: Secondary | ICD-10-CM | POA: Diagnosis not present

## 2019-10-23 DIAGNOSIS — E1169 Type 2 diabetes mellitus with other specified complication: Secondary | ICD-10-CM | POA: Diagnosis not present

## 2019-10-23 DIAGNOSIS — Z5181 Encounter for therapeutic drug level monitoring: Secondary | ICD-10-CM

## 2019-10-23 DIAGNOSIS — E782 Mixed hyperlipidemia: Secondary | ICD-10-CM | POA: Diagnosis not present

## 2019-10-23 DIAGNOSIS — M79671 Pain in right foot: Secondary | ICD-10-CM

## 2019-10-23 DIAGNOSIS — B351 Tinea unguium: Secondary | ICD-10-CM

## 2019-10-23 MED ORDER — SITAGLIPTIN PHOSPHATE 100 MG PO TABS: 100.0000 mg | ORAL_TABLET | Freq: Every day | ORAL | 3 refills | Status: DC

## 2019-10-23 NOTE — Progress Notes (Signed)
Name: Connor Aguilar   MRN: [id number]    DOB: February 02, 1941   Date:10/23/2019       Progress Note  Chief Complaint  Patient presents with  . Follow-up  . Hypertension  . Diabetes  . Hyperlipidemia     Subjective:   Connor Aguilar is a 79 y.o. male, presents to clinic for routine follow up on the conditions listed above.  Diabetes Mellitus Type II: Well controlled - just above goal over the past 1-2 years Currently managing with jardiance and metformin Pt notes good med compliance Pt has no SE from meds. Not monitoring CBGs for the past 2-3 years No hypoglycemic episodes He is on a statin and are both for nephro protection. Last ophthalmology appointment was on 01/05/2019 Patient has onychomycosis of the toenails, last seen podiatry- 01/15/2019 Denies: Polyuria, polydipsia, polyphagia, vision changes, or neuropathy Recent pertinent labs: Lab Results  Component Value Date   HGBA1C 7.1 (H) 09/27/2018   HGBA1C 7.1 (H) 05/16/2018   HGBA1C 6.7 (H) 12/13/2017   Pt is DUE for DM foot exam - done today - going to podiatry for nails and foot deformity/pain and eye exam ACEI/ARB: Yes Statin: Yes Pt going to podiatrist  Essential hypertension Currently managed on amlodipine 5 mg daily, irbesartan 150 mg daily and HCTZ 12.5 mg daily Pt reports good med compliance and denies any SE.  No lightheadedness, hypotension, syncope. Blood pressure today is well controlled. BP Readings from Last 3 Encounters:  10/23/19 110/64  09/27/18 124/78  05/16/18 138/84   Pt denies CP, SOB, exertional sx, LE edema, palpitation, Ha's, visual disturbances Dietary efforts for BP?  None- eats what he wants  - stable, no change from last visit, due for labs   Panlobular emphysema  Patient significant smoking hx, ~ 130 packyear, started smoking when he was 79 years old, currently smoking 2 packs a day, no decrease in amount, no desire to quit He is not on any inhalers.  Endorses daily "smokers cough"  sometimes a little bit of productive sputum Denies shortness of breath Screening CT for lung cancer - ordered previously, still not interested in doing it right now Smoking cessation instruction/counseling given:  counseled patient on the dangers of tobacco use, advised patient to stop smoking, and reviewed strategies to maximize success   Mixed Hyperlipidemia: Current Medication Regimen:  crestor 5 mg, compliant, takes daily, no concerns or SE Last Lipids: Lab Results  Component Value Date   CHOL 94 09/27/2018   HDL 44 09/27/2018   LDLCALC 25 09/27/2018   TRIG 167 (H) 09/27/2018   CHOLHDL 2.1 09/27/2018   - Current Diet:  Heats up a lot of foods, isn't cooking for himself much - Denies: Chest pain, shortness of breath, myalgias.  He reports right foot deformity that is hurting him, making it difficult to wear shoes, injury occurred over 15 years ago, he wants podiatry to help him with this, he is seeing them for his DM foot care and nail care due to onychomycosis   Patient Active Problem List   Diagnosis Date Noted  . Onychomycosis of multiple toenails with type 2 diabetes mellitus ([place]) 05/16/2018  . Erectile dysfunction 08/19/2015  . Tobacco abuse 04/09/2015  . Hyperlipidemia 04/02/2015  . Essential hypertension 04/02/2015  . Panlobular emphysema ([place]) 04/02/2015    Past Surgical History:  Procedure Laterality Date  . FOOT SURGERY      Family History  Problem Relation Age of Onset  . Diabetes Mother     Social  History   Tobacco Use  . Smoking status: Current Every Day Smoker    Packs/day: 2.00    Years: 64.00    Pack years: 128.00    Types: Cigarettes  . Smokeless tobacco: Never Used  Substance Use Topics  . Alcohol use: No    Alcohol/week: 0.0 standard drinks  . Drug use: No      Current Outpatient Medications:  .  acetaminophen (TYLENOL) 325 MG tablet, Take 2 tablets (650 mg total) by mouth every 6 (six) hours as needed for mild pain (or Fever >/=  101)., Disp: , Rfl:  .  amLODipine (NORVASC) 5 MG tablet, TAKE (1) TABLET BY MOUTH EVERY DAY, Disp: 90 tablet, Rfl: 1 .  irbesartan-hydrochlorothiazide (AVALIDE) 150-12.5 MG tablet, Take 1 tablet by mouth daily., Disp: 90 tablet, Rfl: 0 .  metFORMIN (GLUCOPHAGE) 1000 MG tablet, Take 1 tablet (1,000 mg total) by mouth 2 (two) times daily with a meal., Disp: 180 tablet, Rfl: 0 .  rosuvastatin (CRESTOR) 5 MG tablet, TAKE ONE (1) TABLET AT BEDTIME, Disp: 90 tablet, Rfl: 0 .  sitaGLIPtin (JANUVIA) 100 MG tablet, Take 1 tablet (100 mg total) by mouth daily., Disp: 90 tablet, Rfl: 0 .  blood glucose meter kit and supplies, Accuchek or whatever is covered by insurance; check sugars once a day only if desired; Dx E11.65, LON 99 months, Disp: 1 each, Rfl: 0 .  glucose blood test strip, Use as instructed to check sugars once a day if desired; LON 99 months, Dx E11.65, Disp: 100 each, Rfl: 3  No Known Allergies  Chart Review Today: I personally reviewed active problem list, medication list, allergies, family history, social history, health maintenance, notes from last encounter, lab results, imaging with the patient/caregiver today.   Review of Systems  Constitutional: Negative.   HENT: Negative.   Eyes: Negative.   Respiratory: Negative.   Cardiovascular: Negative.   Gastrointestinal: Negative.   Endocrine: Negative.   Genitourinary: Negative.   Musculoskeletal: Negative.   Skin: Negative.   Allergic/Immunologic: Negative.   Neurological: Negative.   Hematological: Negative.   Psychiatric/Behavioral: Negative.   All other systems reviewed and are negative.    Objective:    Vitals:   10/23/19 0951  BP: 110/64  Pulse: 85  Resp: 14  Temp: 97.7 F (36.5 C)  SpO2: 98%  Weight: 158 lb 3.2 oz (71.8 kg)  Height: '5\' 8"'  (1.727 m)    Body mass index is 24.05 kg/m.  Physical Exam Vitals and nursing note reviewed.  Constitutional:      General: He is not in acute distress.     Appearance: Normal appearance. He is well-developed. He is not ill-appearing, toxic-appearing or diaphoretic.     Interventions: Face mask in place.     Comments: Well appearing elderly male, appears younger than stated age, smells strongly of cigarette smoke  HENT:     Head: Normocephalic and atraumatic.     Jaw: No trismus.     Right Ear: External ear normal.     Left Ear: External ear normal.  Eyes:     General: Lids are normal. No scleral icterus.    Conjunctiva/sclera: Conjunctivae normal.     Pupils: Pupils are equal, round, and reactive to light.  Neck:     Trachea: Trachea and phonation normal. No tracheal deviation.  Cardiovascular:     Rate and Rhythm: Normal rate and regular rhythm.     Pulses: Normal pulses.  Radial pulses are 2+ on the right side and 2+ on the left side.       Posterior tibial pulses are 2+ on the right side and 2+ on the left side.     Heart sounds: Normal heart sounds. No murmur. No friction rub. No gallop.   Pulmonary:     Effort: Pulmonary effort is normal. No respiratory distress.     Breath sounds: Normal breath sounds. No stridor. No wheezing, rhonchi or rales.  Abdominal:     General: Bowel sounds are normal. There is no distension.     Palpations: Abdomen is soft.     Tenderness: There is no abdominal tenderness. There is no guarding.  Musculoskeletal:     Cervical back: Normal range of motion and neck supple.     Right lower leg: No edema.     Left lower leg: No edema.     Right foot: Decreased range of motion. Deformity (large bony deformity to right dorsal foot mid 1-3rd MT area, mildly ttp) present.     Left foot: Decreased range of motion.  Feet:     Right foot:     Toenail Condition: Right toenails are abnormally thick and long. Fungal disease present.    Left foot:     Toenail Condition: Left toenails are abnormally thick. Fungal disease present.    Comments: Dm foot exam done - see EMR Over right dorsal foot deformity no  ulceration to skin but some erythema  Skin:    General: Skin is warm and dry.     Capillary Refill: Capillary refill takes less than 2 seconds.     Coloration: Skin is not jaundiced.     Findings: No rash.     Nails: There is no clubbing.  Neurological:     Mental Status: He is alert.     Cranial Nerves: No dysarthria or facial asymmetry.     Motor: No weakness, tremor or abnormal muscle tone.     Coordination: Coordination normal.     Gait: Gait normal.  Psychiatric:        Mood and Affect: Mood normal.        Speech: Speech normal.        Behavior: Behavior normal. Behavior is cooperative.       Diabetic Foot Exam: Diabetic Foot Exam - Simple   Simple Foot Form Diabetic Foot exam was performed with the following findings: Yes 10/23/2019  9:58 AM  Visual Inspection Sensation Testing Pulse Check Comments     PHQ2/9: Depression screen [facility] 2/9 10/23/2019 06/15/2019 03/28/2019 12/04/2018 09/27/2018  Decreased Interest 0 1 0 0 0  Down, Depressed, Hopeless 0 1 0 0 0  PHQ - 2 Score 0 2 0 0 0  Altered sleeping 0 0 0 0 0  Tired, decreased energy 0 0 0 0 0  Change in appetite 0 0 0 0 0  Feeling bad or failure about yourself  0 0 0 0 0  Trouble concentrating 0 0 0 0 0  Moving slowly or fidgety/restless 0 0 0 0 0  Suicidal thoughts 0 0 0 0 0  PHQ-9 Score 0 2 0 0 0  Difficult doing work/chores Not difficult at all Not difficult at all Not difficult at all Not difficult at all Not difficult at all  Some recent data might be hidden    phq 9 is neg, reviewed   Fall Risk: Fall Risk  10/23/2019 06/15/2019 03/28/2019 12/04/2018 09/27/2018  Falls in the past year?  0 0 0 0 0  Number falls in past yr: 0 0 0 - 0  Injury with Fall? 0 0 0 - 0  Risk for fall due to : - - - - -  Risk for fall due to: Comment - - - - -  Follow up - Falls prevention discussed - - -    Functional Status Survey: Is the patient deaf or have difficulty hearing?: No Does the patient have difficulty seeing, even  when wearing glasses/contacts?: No Does the patient have difficulty concentrating, remembering, or making decisions?: No Does the patient have difficulty walking or climbing stairs?: No Does the patient have difficulty dressing or bathing?: No Does the patient have difficulty doing errands alone such as visiting a doctor's office or shopping?: No   Assessment & Plan:   1. Essential hypertension Well-controlled with current regimen we will check renal function and electrolytes - COMPLETE METABOLIC PANEL WITH GFR  2. Panlobular emphysema ([place]) Unfortunately patient continues to smoke 2 packs a day with significant smoking history, has a chronic "smoker's cough" good air movement today on lung exam no decreased breath sounds no wheeze rhonchi or rales - COMPLETE METABOLIC PANEL WITH GFR - CBC with Differential/Platelet  3. Mixed hyperlipidemia Compliant with meds, no SE, no myalgias, fatigue or jaundice Due for FLP and recheck CMP - COMPLETE METABOLIC PANEL WITH GFR - Lipid panel  4. Type 2 diabetes mellitus without complication, without long-term current use of insulin ([facility]) Compliant with medications, not monitoring blood sugar but has a history of fairly well-controlled diabetes last A1c was 7.1 we will recheck labs, foot exam done today, eye exam has been completed in the past year, he is compliant with ACE inhibitor and statin - COMPLETE METABOLIC PANEL WITH GFR - Hemoglobin A1c  5. Encounter for medication monitoring - COMPLETE METABOLIC PANEL WITH GFR - Lipid panel - Hemoglobin A1c - CBC with Differential/Platelet  6. Onychomycosis of multiple toenails with type 2 diabetes mellitus [facility]) Patient is seeing podiatry for management  7. Tobacco abuse Continued heavy smoking no desire to quit, smoking cessation discussed today  8. Foot deformity, acquired, right dorsal foot pain and deformity -patient would like podiatry to help manage this they can do an evaluation or  injections to decrease his pain this is a chronic deformity but patient does complain of difficulty wearing his shoes and boots cannot lace them up does complain of pain over the largest area of deformity and enlargement to his dorsal foot - Ambulatory referral to Podiatry  9. Right foot pain The above - Ambulatory referral to Podiatry   Return for 4 month f/up with PCP DM, HTN, HLD.   Delsa Grana, PA-C 10/23/19 9:58 AM

## 2019-10-24 LAB — CBC WITH DIFFERENTIAL/PLATELET
Absolute Monocytes: 724 cells/uL (ref 200–950)
Basophils Absolute: 113 cells/uL (ref 0–200)
Basophils Relative: 1.2 %
Eosinophils Absolute: 188 cells/uL (ref 15–500)
Eosinophils Relative: 2 %
HCT: 40.6 % (ref 38.5–50.0)
Hemoglobin: 14.2 g/dL (ref 13.2–17.1)
Lymphs Abs: 2294 cells/uL (ref 850–3900)
MCH: 31.8 pg (ref 27.0–33.0)
MCHC: 35 g/dL (ref 32.0–36.0)
MCV: 90.8 fL (ref 80.0–100.0)
MPV: 10.2 fL (ref 7.5–12.5)
Monocytes Relative: 7.7 %
Neutro Abs: 6082 cells/uL (ref 1500–7800)
Neutrophils Relative %: 64.7 %
Platelets: 316 10*3/uL (ref 140–400)
RBC: 4.47 10*6/uL (ref 4.20–5.80)
RDW: 12.9 % (ref 11.0–15.0)
Total Lymphocyte: 24.4 %
WBC: 9.4 10*3/uL (ref 3.8–10.8)

## 2019-10-24 LAB — COMPLETE METABOLIC PANEL WITH GFR
AG Ratio: 1.9 (calc) (ref 1.0–2.5)
ALT: 14 U/L (ref 9–46)
AST: 13 U/L (ref 10–35)
Albumin: 4.3 g/dL (ref 3.6–5.1)
Alkaline phosphatase (APISO): 44 U/L (ref 35–144)
BUN: 10 mg/dL (ref 7–25)
CO2: 25 mmol/L (ref 20–32)
Calcium: 9.5 mg/dL (ref 8.6–10.3)
Chloride: 98 mmol/L (ref 98–110)
Creat: 0.85 mg/dL (ref 0.70–1.11)
GFR, Est African American: 95 mL/min/{1.73_m2} (ref >=60)
GFR, Est Non African American: 82 mL/min/{1.73_m2} (ref >=60)
Globulin: 2.3 g/dL (calc) (ref 1.9–3.7)
Glucose, Bld: 114 mg/dL — ABNORMAL HIGH (ref 65–99)
Potassium: 4.1 mmol/L (ref 3.5–5.3)
Sodium: 138 mmol/L (ref 135–146)
Total Bilirubin: 0.5 mg/dL (ref 0.2–1.2)
Total Protein: 6.6 g/dL (ref 6.1–8.1)

## 2019-10-24 LAB — LIPID PANEL
Cholesterol: 94 mg/dL (ref <200)
HDL: 42 mg/dL (ref >=40)
LDL Cholesterol (Calc): 29 mg/dL (calc)
Non-HDL Cholesterol (Calc): 52 mg/dL (calc) (ref <130)
Total CHOL/HDL Ratio: 2.2 (calc) (ref <5.0)
Triglycerides: 145 mg/dL (ref <150)

## 2019-10-24 LAB — HEMOGLOBIN A1C
Hgb A1c MFr Bld: 6.5 % of total Hgb — ABNORMAL HIGH (ref <5.7)
Mean Plasma Glucose: 140 (calc)
eAG (mmol/L): 7.7 (calc)

## 2019-10-25 ENCOUNTER — Encounter: Payer: Self-pay | Admitting: Podiatry

## 2019-10-25 ENCOUNTER — Other Ambulatory Visit: Payer: Self-pay

## 2019-10-25 ENCOUNTER — Ambulatory Visit: Payer: Medicare HMO | Admitting: Podiatry

## 2019-10-25 VITALS — Temp 97.5°F

## 2019-10-25 DIAGNOSIS — B351 Tinea unguium: Secondary | ICD-10-CM | POA: Diagnosis not present

## 2019-10-25 DIAGNOSIS — M79676 Pain in unspecified toe(s): Secondary | ICD-10-CM | POA: Diagnosis not present

## 2019-10-25 DIAGNOSIS — M129 Arthropathy, unspecified: Secondary | ICD-10-CM | POA: Diagnosis not present

## 2019-10-25 DIAGNOSIS — E1142 Type 2 diabetes mellitus with diabetic polyneuropathy: Secondary | ICD-10-CM | POA: Diagnosis not present

## 2019-10-25 NOTE — Progress Notes (Signed)
This patient returns to my office for at risk foot care.  This patient requires this care by a professional since this patient will be at risk due to having  Diabetes.  Patient has history of arthritis right midfoot.  This patient is unable to cut nails himself since the patient cannot reach his nails.These nails are painful walking and wearing shoes.  This patient presents for at risk foot care today.  General Appearance  Alert, conversant and in no acute stress.  Vascular  Dorsalis pedis and posterior tibial  pulses are palpable  bilaterally.  Capillary return is within normal limits  bilaterally. Temperature is within normal limits  bilaterally.  Neurologic  Senn-Weinstein monofilament wire test within normal limits  bilaterally. Muscle power within normal limits bilaterally.  Nails Thick disfigured discolored nails with subungual debris  from hallux to fifth toes bilaterally. No evidence of bacterial infection or drainage bilaterally.  Orthopedic  No limitations of motion  feet .  No crepitus or effusions noted.  No bony pathology or digital deformities noted.  Liz-Frank arthritis right foot.  Skin  normotropic skin with no porokeratosis noted bilaterally.  No signs of infections or ulcers noted.     Onychomycosis  Pain in right toes  Pain in left toes.  Arthritis right foot.  Consent was obtained for treatment procedures.   Mechanical debridement of nails 1-5  bilaterally performed with a nail nipper.  Filed with dremel without incident. No infection or ulcer.  Told patient to be evaluated by Dr.  Posey Pronto for evaluation of right arthritic foot.   Return office visit   9 weeks                   Told patient to return for periodic foot care and evaluation due to potential at risk complications.   Gardiner Barefoot DPM

## 2019-10-30 ENCOUNTER — Ambulatory Visit: Payer: Medicare HMO | Admitting: Podiatry

## 2019-11-06 ENCOUNTER — Ambulatory Visit (INDEPENDENT_AMBULATORY_CARE_PROVIDER_SITE_OTHER): Payer: Medicare HMO | Admitting: Podiatry

## 2019-11-06 ENCOUNTER — Ambulatory Visit (INDEPENDENT_AMBULATORY_CARE_PROVIDER_SITE_OTHER): Payer: Medicare HMO

## 2019-11-06 ENCOUNTER — Other Ambulatory Visit: Payer: Self-pay

## 2019-11-06 DIAGNOSIS — M19071 Primary osteoarthritis, right ankle and foot: Secondary | ICD-10-CM

## 2019-11-06 DIAGNOSIS — M19079 Primary osteoarthritis, unspecified ankle and foot: Secondary | ICD-10-CM | POA: Diagnosis not present

## 2019-11-06 DIAGNOSIS — E1142 Type 2 diabetes mellitus with diabetic polyneuropathy: Secondary | ICD-10-CM

## 2019-11-07 ENCOUNTER — Encounter: Payer: Self-pay | Admitting: Podiatry

## 2019-11-07 NOTE — Progress Notes (Signed)
Subjective:  Patient ID: Connor Aguilar, male    DOB: 03-Nov-1940,  MRN: [id number]  Chief Complaint  Patient presents with  . Foot Pain    pt is here for a referral of Dr. Prudence Davidson, pt state the foot pain has been going on for multiple years, pt states that it is painful to the touch.    79 y.o. male presents with the above complaint.  Patient presents with right midfoot arthritis that is very severe in nature.  Patient had an forklift injury long time ago that may have led to a missed Lisfranc injury.  There is severe arthritic changes in the first through fifth metatarsal tarsometatarsal joints.  There is dorsal exostosis noted at the first tarsometatarsal joint.  Pain on palpation.  Pain is constant.  Pain is when rubbing against the shoe.  He denies any other acute complaints.  He would like to know what are his treatment options.  He has not tried any injections.   Review of Systems: Negative except as noted in the HPI. Denies N/V/F/Ch.  Past Medical History:  Diagnosis Date  . Allergy   . Diabetes mellitus without complication ([place])   . Elevated CPK 11/18/2015  . Hyperlipidemia   . Hypertension   . Tobacco abuse     Current Outpatient Medications:  .  acetaminophen (TYLENOL) 325 MG tablet, Take 2 tablets (650 mg total) by mouth every 6 (six) hours as needed for mild pain (or Fever >/= 101)., Disp: , Rfl:  .  amLODipine (NORVASC) 5 MG tablet, TAKE (1) TABLET BY MOUTH EVERY DAY, Disp: 90 tablet, Rfl: 1 .  blood glucose meter kit and supplies, Accuchek or whatever is covered by insurance; check sugars once a day only if desired; Dx E11.65, LON 99 months, Disp: 1 each, Rfl: 0 .  glucose blood test strip, Use as instructed to check sugars once a day if desired; LON 99 months, Dx E11.65, Disp: 100 each, Rfl: 3 .  irbesartan-hydrochlorothiazide (AVALIDE) 150-12.5 MG tablet, Take 1 tablet by mouth daily., Disp: 90 tablet, Rfl: 0 .  metFORMIN (GLUCOPHAGE) 1000 MG tablet, Take 1 tablet (1,000  mg total) by mouth 2 (two) times daily with a meal., Disp: 180 tablet, Rfl: 0 .  rosuvastatin (CRESTOR) 5 MG tablet, TAKE ONE (1) TABLET AT BEDTIME, Disp: 90 tablet, Rfl: 0 .  sitaGLIPtin (JANUVIA) 100 MG tablet, Take 1 tablet (100 mg total) by mouth daily., Disp: 90 tablet, Rfl: 3  Social History   Tobacco Use  Smoking Status Current Every Day Smoker  . Packs/day: 2.00  . Years: 64.00  . Pack years: 128.00  . Types: Cigarettes  Smokeless Tobacco Never Used    No Known Allergies Objective:  There were no vitals filed for this visit. There is no height or weight on file to calculate BMI. Constitutional Well developed. Well nourished.  Vascular Dorsalis pedis pulses palpable bilaterally. Posterior tibial pulses palpable bilaterally. Capillary refill normal to all digits.  No cyanosis or clubbing noted. Pedal hair growth normal.  Neurologic Normal speech. Oriented to person, place, and time. Epicritic sensation to light touch grossly present bilaterally.  Dermatologic Nails well groomed and normal in appearance. No open wounds. No skin lesions.  Orthopedic:  Right first tarsometatarsal joint arthritis.  Pain on palpation severely.  Pain along the dorsal midfoot as well.   Radiographs: 3 views of skeletally mature adult foot: Severe arthritic changes noted of the first tarsometatarsal joint as well as the rest of the other tarsometatarsal joint.  Arthritis of the midfoot noted.  No other bony abnormalities identified.  There is exostosis present at the first tarsometatarsal joint. Assessment:   1. Arthritis of right foot   2. Diabetic polyneuropathy associated with type 2 diabetes mellitus ([place])    Plan:  Patient was evaluated and treated and all questions answered.  Right midfoot arthritis/exostosis at first tarsometatarsal joint -I explained to the patient the etiology of arthritis and given that he has a history of previous Lisfranc injury is likely the etiology and the  cause of the arthritic changes.  I believe patient will benefit from a steroid injection to decrease the inflammatory component associated with arthritis as well as swelling.  Patient agrees with the plan would like to proceed with injection. -A steroid injection was performed at right dorsal first tarsometatarsal joint using 1% plain Lidocaine and 10 mg of Kenalog. This was well tolerated. -If the injection helps resolve the pain I do not mind doing the injections every 6 weeks to 8 weeks.  However if the pain is not improving will consider also different type of bracing to help with the pain.  No follow-ups on file.

## 2019-12-03 ENCOUNTER — Telehealth: Payer: Self-pay | Admitting: Family Medicine

## 2019-12-03 NOTE — Chronic Care Management (AMB) (Signed)
  Chronic Care Management   Note  12/03/2019 Name: Aramis Weil MRN: [id number] DOB: 1940-09-12  Nole Robey is a 79 y.o. year old male who is a primary care patient of Delsa Grana, [place]. I reached out to [name] by phone today in response to a referral sent by Mr. Spiros Frysinger's health plan.     Mr. Rendell was given information about Chronic Care Management services today including:  1. CCM service includes personalized support from designated clinical staff supervised by his physician, including individualized plan of care and coordination with other care providers 2. 24/7 contact phone numbers for assistance for urgent and routine care needs. 3. Service will only be billed when office clinical staff spend 20 minutes or more in a month to coordinate care. 4. Only one practitioner may furnish and bill the service in a calendar month. 5. The patient may stop CCM services at any time (effective at the end of the month) by phone call to the office staff. 6. The patient will be responsible for cost sharing (co-pay) of up to 20% of the service fee (after annual deductible is met).  Patient agreed to services and verbal consent obtained.   Follow up plan: Telephone appointment with care management team member scheduled for:12/11/2019  Noreene Larsson, [place], [place], [place] [address] Direct Dial: [phone] [email or web address] Website: [email or web address]

## 2019-12-05 ENCOUNTER — Other Ambulatory Visit: Payer: Self-pay | Admitting: Emergency Medicine

## 2019-12-05 DIAGNOSIS — Z5181 Encounter for therapeutic drug level monitoring: Secondary | ICD-10-CM

## 2019-12-10 ENCOUNTER — Ambulatory Visit: Payer: Medicare HMO | Admitting: Podiatry

## 2019-12-11 ENCOUNTER — Ambulatory Visit: Payer: Medicare HMO | Admitting: Pharmacist

## 2019-12-11 ENCOUNTER — Other Ambulatory Visit: Payer: Self-pay

## 2019-12-11 DIAGNOSIS — E782 Mixed hyperlipidemia: Secondary | ICD-10-CM

## 2019-12-11 DIAGNOSIS — I10 Essential (primary) hypertension: Secondary | ICD-10-CM

## 2019-12-11 NOTE — Progress Notes (Deleted)
   Chronic Care Management Pharmacy  Name: Connor Aguilar  MRN: [id number] DOB: [birth year]  Initial Planning Appointment: ***  Initial Questions: 1. Have you seen any other providers since your last visit? {YES/NO:21197} 2. Any changes in your medicines or health? {YES/NO:21197}  Chief Complaint/ HPI  [name],  79 y.o. , male presents for their {Initial/Follow-up:3041532} CCM visit with the clinical pharmacist {CHL [facility] visit [id number]}.  PCP : Delsa Grana, PA-C  Their chronic conditions include: {CHL AMB CHRONIC MEDICAL CONDITIONS:2814694320}  Office Visits:***  Consult Visit:***  Medications: Outpatient Encounter Medications as of 12/11/2019  Medication Sig  . acetaminophen (TYLENOL) 325 MG tablet Take 2 tablets (650 mg total) by mouth every 6 (six) hours as needed for mild pain (or Fever >/= 101).  Marland Kitchen amLODipine (NORVASC) 5 MG tablet TAKE (1) TABLET BY MOUTH EVERY DAY  . blood glucose meter kit and supplies Accuchek or whatever is covered by insurance; check sugars once a day only if desired; Dx E11.65, LON 99 months  . glucose blood test strip Use as instructed to check sugars once a day if desired; LON 99 months, Dx E11.65  . irbesartan-hydrochlorothiazide (AVALIDE) 150-12.5 MG tablet Take 1 tablet by mouth daily.  . metFORMIN (GLUCOPHAGE) 1000 MG tablet Take 1 tablet (1,000 mg total) by mouth 2 (two) times daily with a meal.  . rosuvastatin (CRESTOR) 5 MG tablet TAKE ONE (1) TABLET AT BEDTIME  . sitaGLIPtin (JANUVIA) 100 MG tablet Take 1 tablet (100 mg total) by mouth daily.   No facility-administered encounter medications on file as of 12/11/2019.     Current Diagnosis/Assessment:  Goals Addressed   None     {[facility] Diagnosis/[phone]}

## 2019-12-11 NOTE — Patient Instructions (Signed)
Visit Information  Goals Addressed            This Visit's Progress   . Diabetes Mellitus Goal A1c < 7% (pt-stated)       CARE PLAN ENTRY (see longitudinal plan of care for additional care plan information)  Current Barriers:  . Diabetes: type 2; complicated by chronic medical conditions including COPD, HTN, Lab Results  Component Value Date   HGBA1C 6.5 (H) 10/23/2019 .   Lab Results  Component Value Date   CREATININE 0.85 10/23/2019   CREATININE 0.94 09/27/2018   CREATININE 1.09 05/16/2018 .   Marland Kitchen No results found for: EGFR . Current antihyperglycemic regimen: metformin . Denies hypoglycemic symptoms, including dizziness, lightheadedness, shaking, sweating . Denies hyperglycemic symptoms, including polyuria, polydipsia, polyphagia, nocturia, blurred vision, neuropathy . Current exercise: Still works as Teacher, adult education. . Current blood glucose readings: does not check . Cardiovascular risk reduction: o Current hypertensive regimen: Avalide o Current hyperlipidemia regimen: Crestor o Current antiplatelet regimen: None  Pharmacist Clinical Goal(s):  Marland Kitchen Over the next 90 days, patient will work with PharmD and primary care provider to address maintaining goal achieved with most recent labwork.  Interventions: . Comprehensive medication review performed, medication list updated in electronic medical record . Inter-disciplinary care team collaboration (see longitudinal plan of care) . Recommend ASA 87m  Patient Self Care Activities:  . Patient will report any questions or concerns to provider  . Try to reduce the number of daily cigarettes if possible . Take a daily 853maspirin  Initial goal documentation     . Hyperlipidemia - goal LDL < 70 (pt-stated)       CARE PLAN ENTRY (see longitudinal plan of care for additional care plan information)  Current Barriers:  . Controlled hyperlipidemia, complicated by Diabetes, COPD . Current antihyperlipidemic regimen:  Crestor . Previous antihyperlipidemic medications tried NA . Most recent lipid panel:     Component Value Date/Time   CHOL 94 10/23/2019 1016   CHOL 196 12/04/2015 0803   TRIG 145 10/23/2019 1016   HDL 42 10/23/2019 1016   HDL 47 12/04/2015 0803   CHOLHDL 2.2 10/23/2019 1016   LDLCALC 29 10/23/2019 1016 .   . Marland KitchenSCVD risk enhancing conditions: age >6>3DM, HTN, CKD, CHF, current smoker  Pharmacist Clinical Goal(s):  . Marland Kitchenver the next 90 days, patient will continue to work with PharmD and providers towards continued optimized antihyperlipidemic therapy . Continue Crestor despite very low LDL due to diabetes and continued smoking  Interventions: . Comprehensive medication review performed; medication list updated in electronic medical record.  . Bertram Savinare team collaboration (see longitudinal plan of care)  Patient Self Care Activities:  . Patient will focus on medication adherence by continuing current pattern  Initial goal documentation     . Hypertension - goal BP < 140/90 (pt-stated)       CARE PLAN ENTRY (see longitudinal plan of care for additional care plan information)  Current Barriers:  . Controlled hypertension, complicated by Diabetes, smoking, hyperlipidemia . Current antihypertensive regimen: Avalide . Previous antihypertensives tried: NA . Last practice recorded BP readings:  BP Readings from Last 3 Encounters:  10/23/19 110/64  09/27/18 124/78  05/16/18 138/84    Pharmacist Clinical Goal(s):  . Marland Kitchenver the next 90 days, patient will work with PharmD and providers to continue optimized antihypertensive regimen  Interventions: . Inter-disciplinary care team collaboration (see longitudinal plan of care) . Comprehensive medication review performed; medication list updated in the electronic medical record.  Patient Self Care Activities:  . Patient will focus on medication adherence by continuing current pattern  Initial goal documentation         Connor Aguilar was given information about Chronic Care Management services today including:  1. CCM service includes personalized support from designated clinical staff supervised by his physician, including individualized plan of care and coordination with other care providers 2. 24/7 contact phone numbers for assistance for urgent and routine care needs. 3. Standard insurance, coinsurance, copays and deductibles apply for chronic care management only during months in which we provide at least 20 minutes of these services. Most insurances cover these services at 100%, however patients may be responsible for any copay, coinsurance and/or deductible if applicable. This service may help you avoid the need for more expensive face-to-face services. 4. Only one practitioner may furnish and bill the service in a calendar month. 5. The patient may stop CCM services at any time (effective at the end of the month) by phone call to the office staff.  Patient agreed to services and verbal consent obtained.   Print copy of patient instructions provided.  Telephone follow up appointment with pharmacy team member scheduled for: 6 months  Milus Height, PharmD, [place], [facility] [phone] Smoking Tobacco Information, Adult Smoking tobacco can be harmful to your health. Tobacco contains a poisonous (toxic), colorless chemical called nicotine. Nicotine is addictive. It changes the brain and can make it hard to stop smoking. Tobacco also has other toxic chemicals that can hurt your body and raise your risk of many cancers. How can smoking tobacco affect me? Smoking tobacco puts you at risk for:  Cancer. Smoking is most commonly associated with lung cancer, but can also lead to cancer in other parts of the body.  Chronic obstructive pulmonary disease (COPD). This is a long-term lung condition that makes it hard to breathe. It also gets worse over time.  High blood  pressure (hypertension), heart disease, stroke, or heart attack.  Lung infections, such as pneumonia.  Cataracts. This is when the lenses in the eyes become clouded.  Digestive problems. This may include peptic ulcers, heartburn, and gastroesophageal reflux disease (GERD).  Oral health problems, such as gum disease and tooth loss.  Loss of taste and smell. Smoking can affect your appearance by causing:  Wrinkles.  Yellow or stained teeth, fingers, and fingernails. Smoking tobacco can also affect your social life, because:  It may be challenging to find places to smoke when away from home. Many workplaces, [employer], hotels, and public places are tobacco-free.  Smoking is expensive. This is due to the cost of tobacco and the long-term costs of treating health problems from smoking.  Secondhand smoke may affect those around you. Secondhand smoke can cause lung cancer, breathing problems, and heart disease. Children of smokers have a higher risk for: ? Sudden infant death syndrome (SIDS). ? Ear infections. ? Lung infections. If you currently smoke tobacco, quitting now can help you:  Lead a longer and healthier life.  Look, smell, breathe, and feel better over time.  Save money.  Protect others from the harms of secondhand smoke. What actions can I take to prevent health problems? Quit smoking   Do not start smoking. Quit if you already do.  Make a plan to quit smoking and commit to it. Look for programs to help you and ask your health care provider for recommendations and ideas.  Set a date and write down all the reasons you  want to quit.  Let your friends and family know you are quitting so they can help and support you. Consider finding friends who also want to quit. It can be easier to quit with someone else, so that you can support each other.  Talk with your health care provider about using nicotine replacement medicines to help you quit, such as gum, lozenges,  patches, sprays, or pills.  Do not replace cigarette smoking with electronic cigarettes, which are commonly called e-cigarettes. The safety of e-cigarettes is not known, and some may contain harmful chemicals.  If you try to quit but return to smoking, stay positive. It is common to slip up when you first quit, so take it one day at a time.  Be prepared for cravings. When you feel the urge to smoke, chew gum or suck on hard candy. Lifestyle  Stay busy and take care of your body.  Drink enough fluid to keep your urine pale yellow.  Get plenty of exercise and eat a healthy diet. This can help prevent weight gain after quitting.  Monitor your eating habits. Quitting smoking can cause you to have a larger appetite than when you smoke.  Find ways to relax. Go out with friends or family to a movie or a restaurant where people do not smoke.  Ask your health care provider about having regular tests (screenings) to check for cancer. This may include blood tests, imaging tests, and other tests.  Find ways to manage your stress, such as meditation, yoga, or exercise. Where to find support To get support to quit smoking, consider:  Asking your health care provider for more information and resources.  Taking classes to learn more about quitting smoking.  Looking for local organizations that offer resources about quitting smoking.  Joining a support group for people who want to quit smoking in your local community.  Calling the smokefree.gov counselor helpline: 1-800-Quit-Now (480) 499-3738) Where to find more information You may find more information about quitting smoking from:  HelpGuide.org: www.helpguide.org  https://hall.com/: smokefree.gov  American Lung Association: www.lung.org Contact a health care provider if you:  Have problems breathing.  Notice that your lips, nose, or fingers turn blue.  Have chest pain.  Are coughing up blood.  Feel faint or you pass out.  Have  other health changes that cause you to worry. Summary  Smoking tobacco can negatively affect your health, the health of those around you, your finances, and your social life.  Do not start smoking. Quit if you already do. If you need help quitting, ask your health care provider.  Think about joining a support group for people who want to quit smoking in your local community. There are many effective programs that will help you to quit this behavior. This information is not intended to replace advice given to you by your health care provider. Make sure you discuss any questions you have with your health care provider. Document Revised: 04/20/2019 Document Reviewed: 08/10/2016 Elsevier Patient Education  2020 [employer].

## 2019-12-11 NOTE — Chronic Care Management (AMB) (Signed)
Chronic Care Management Pharmacy  Name: Connor Aguilar  MRN: [id number] DOB: 1941-01-31  Chief Complaint/ HPI  Connor Aguilar,  79 y.o. , male presents for their Initial CCM visit with the clinical pharmacist via telephone due to COVID-19 Pandemic.  PCP : Connor Grana, PA-C  Their chronic conditions include: DM, neuropathy, HTN, COPD  Office Visits: 3/16 COPD, Connor Aguilar, BP 110/64 P 85 Wt 158 SpO2 98%  Consult Visit: 3/30 arthritis R foot, Connor Aguilar, steroid inj  Medications: Outpatient Encounter Medications as of 12/11/2019  Medication Sig  . acetaminophen (TYLENOL) 325 MG tablet Take 2 tablets (650 mg total) by mouth every 6 (six) hours as needed for mild pain (or Fever >/= 101).  Marland Kitchen amLODipine (NORVASC) 5 MG tablet TAKE (1) TABLET BY MOUTH EVERY DAY  . blood glucose meter kit and supplies Accuchek or whatever is covered by insurance; check sugars once a day only if desired; Dx E11.65, LON 99 months  . glucose blood test strip Use as instructed to check sugars once a day if desired; LON 99 months, Dx E11.65  . irbesartan-hydrochlorothiazide (AVALIDE) 150-12.5 MG tablet Take 1 tablet by mouth daily.  . metFORMIN (GLUCOPHAGE) 1000 MG tablet Take 1 tablet (1,000 mg total) by mouth 2 (two) times daily with a meal.  . rosuvastatin (CRESTOR) 5 MG tablet TAKE ONE (1) TABLET AT BEDTIME  . sitaGLIPtin (JANUVIA) 100 MG tablet Take 1 tablet (100 mg total) by mouth daily.   No facility-administered encounter medications on file as of 12/11/2019.     Current Diagnosis/Assessment:  Goals Addressed            This Visit's Progress   . Diabetes Mellitus Goal A1c < 7% (pt-stated)       CARE PLAN ENTRY (see longitudinal plan of care for additional care plan information)  Current Barriers:  . Diabetes: type 2; complicated by chronic medical conditions including COPD, HTN, Lab Results  Component Value Date   HGBA1C 6.5 (H) 10/23/2019 .   Lab Results  Component Value Date   CREATININE 0.85  10/23/2019   CREATININE 0.94 09/27/2018   CREATININE 1.09 05/16/2018 .   Marland Kitchen No results found for: EGFR . Current antihyperglycemic regimen: metformin . Denies hypoglycemic symptoms, including dizziness, lightheadedness, shaking, sweating . Denies hyperglycemic symptoms, including polyuria, polydipsia, polyphagia, nocturia, blurred vision, neuropathy . Current exercise: Still works as Teacher, adult education. . Current blood glucose readings: does not check . Cardiovascular risk reduction: o Current hypertensive regimen: Avalide o Current hyperlipidemia regimen: Crestor o Current antiplatelet regimen: None  Pharmacist Clinical Goal(s):  Marland Kitchen Over the next 90 days, patient will work with PharmD and primary care provider to address maintaining goal achieved with most recent labwork.  Interventions: . Comprehensive medication review performed, medication list updated in electronic medical record . Inter-disciplinary care team collaboration (see longitudinal plan of care) . Recommend ASA 64m  Patient Self Care Activities:  . Patient will report any questions or concerns to provider  . Try to reduce the number of daily cigarettes if possible . Take a daily 844maspirin  Initial goal documentation     . Hyperlipidemia - goal LDL < 70 (pt-stated)       CARE PLAN ENTRY (see longitudinal plan of care for additional care plan information)  Current Barriers:  . Controlled hyperlipidemia, complicated by Diabetes, COPD . Current antihyperlipidemic regimen: Crestor . Previous antihyperlipidemic medications tried NA . Most recent lipid panel:     Component Value Date/Time   CHOL 94 10/23/2019  1016   CHOL 196 12/04/2015 0803   TRIG 145 10/23/2019 1016   HDL 42 10/23/2019 1016   HDL 47 12/04/2015 0803   CHOLHDL 2.2 10/23/2019 1016   LDLCALC 29 10/23/2019 1016 .   Marland Kitchen ASCVD risk enhancing conditions: age >12, DM, HTN, CKD, CHF, current smoker  Pharmacist Clinical Goal(s):  Marland Kitchen Over the  next 90 days, patient will continue to work with PharmD and providers towards continued optimized antihyperlipidemic therapy . Continue Crestor despite very low LDL due to diabetes and continued smoking  Interventions: . Comprehensive medication review performed; medication list updated in electronic medical record.  Connor Aguilar care team collaboration (see longitudinal plan of care)  Patient Self Care Activities:  . Patient will focus on medication adherence by continuing current pattern  Initial goal documentation     . Hypertension - goal BP < 140/90 (pt-stated)       CARE PLAN ENTRY (see longitudinal plan of care for additional care plan information)  Current Barriers:  . Controlled hypertension, complicated by Diabetes, smoking, hyperlipidemia . Current antihypertensive regimen: Avalide . Previous antihypertensives tried: NA . Last practice recorded BP readings:  BP Readings from Last 3 Encounters:  10/23/19 110/64  09/27/18 124/78  05/16/18 138/84    Pharmacist Clinical Goal(s):  Marland Kitchen Over the next 90 days, patient will work with PharmD and providers to continue optimized antihypertensive regimen  Interventions: . Inter-disciplinary care team collaboration (see longitudinal plan of care) . Comprehensive medication review performed; medication list updated in the electronic medical record.   Patient Self Care Activities:  . Patient will focus on medication adherence by continuing current pattern  Initial goal documentation       Diabetes   Recent Relevant Labs: Lab Results  Component Value Date/Time   HGBA1C 6.5 (H) 10/23/2019 10:16 AM   HGBA1C 7.1 (H) 09/27/2018 03:49 PM   MICROALBUR 0.2 12/13/2017 02:45 PM   MICROALBUR neg 11/18/2015 03:45 PM     Checking BG: Rarely  Patient is currently controlled on the following medications: metformin  Last diabetic Foot exam:  Lab Results  Component Value Date/Time   HMDIABEYEEXA No Retinopathy 01/05/2019  12:00 AM    Last diabetic Eye exam: No results found for: HMDIABFOOTEX   We discussed: Januvia quite affordable  Plan  Continue current medications  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 Packs/day: 2.00  . Years: 64.00  . Pack years: 128.00  . Types: Cigarettes  Smokeless Tobacco Never Used    Patient has failed these meds in past: None Patient is currently controlled on the following medications: None Using maintenance inhaler regularly? No Frequency of rescue inhaler use:  never  We discussed:  not interested in quittting, morning cough, don't need inhaler. Not interested in reducing the number of daily cigarettes.  Does not feel limited at all by difficulty breathing  Plan  Continue current medications  Hyperlipidemia   Lipid Panel     Component Value Date/Time   CHOL 94 10/23/2019 1016   CHOL 196 12/04/2015 0803   TRIG 145 10/23/2019 1016   HDL 42 10/23/2019 1016   HDL 47 12/04/2015 0803   CHOLHDL 2.2 10/23/2019 1016   LDLCALC 29 10/23/2019 1016   LABVLDL 67 (H) 12/04/2015 0803     The ASCVD Risk score (Goff DC Jr., et al., 2013) failed to calculate for the following reasons:   The 2013 ASCVD risk score is only valid for ages 90 to 36   Patient has failed these meds in past: NA Patient is currently controlled on the following medications: Crestor  We discussed:  denies myalgias and memory loss  Plan  Continue current medications  Hypertension    Office blood pressures are  BP Readings from Last 3 Encounters:  10/23/19 110/64  09/27/18 124/78    05/16/18 138/84    Patient has failed these meds in the past: None  Patient checks BP at home Never  Patient home BP readings are ranging: NA  We discussed smoking cessation  Plan  Continue current medications    Milus Height, PharmD, BCGP, [facility] [phone]

## 2019-12-17 ENCOUNTER — Other Ambulatory Visit: Payer: Self-pay | Admitting: Family Medicine

## 2019-12-17 DIAGNOSIS — E782 Mixed hyperlipidemia: Secondary | ICD-10-CM

## 2019-12-27 ENCOUNTER — Ambulatory Visit: Payer: Medicare HMO | Admitting: Podiatry

## 2020-01-03 ENCOUNTER — Ambulatory Visit: Payer: Medicare HMO | Admitting: Podiatry

## 2020-01-17 ENCOUNTER — Ambulatory Visit: Payer: Medicare HMO | Admitting: Podiatry

## 2020-01-17 ENCOUNTER — Other Ambulatory Visit: Payer: Self-pay

## 2020-01-17 DIAGNOSIS — E1142 Type 2 diabetes mellitus with diabetic polyneuropathy: Secondary | ICD-10-CM

## 2020-01-17 DIAGNOSIS — M19071 Primary osteoarthritis, right ankle and foot: Secondary | ICD-10-CM

## 2020-01-17 DIAGNOSIS — M79676 Pain in unspecified toe(s): Secondary | ICD-10-CM

## 2020-01-17 DIAGNOSIS — B351 Tinea unguium: Secondary | ICD-10-CM | POA: Diagnosis not present

## 2020-01-18 ENCOUNTER — Encounter: Payer: Self-pay | Admitting: Podiatry

## 2020-01-18 NOTE — Progress Notes (Signed)
Subjective:  Patient ID: Connor Aguilar, male    DOB: 1941/07/21,  MRN: [id number]  Chief Complaint  Patient presents with  . Foot Pain    pt is here for a f/u of foot pain    79 y.o. male presents with the above complaint.  Patient presents with a follow-up of right midfoot arthritis.  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  Patient is having pain at the first tarsometatarsal joint.  The injection I gave him last and did help him somewhat but did not make his pain go away.  He would like to receive another steroid injection and see if we can get him closer to 50 or 60% better.  He also has secondary complaint of thickened elongated dystrophic toenails x10.  He would like to have them debrided down he is not able to do it himself.  They are painful in nature.  Painful when ambulating.  He denies any other acute complaints.   Review of Systems: Negative except as noted in the HPI. Denies N/V/F/Ch.  Past Medical History:  Diagnosis Date  . Allergy   . Diabetes mellitus without complication ([place])   . Elevated CPK 11/18/2015  . Hyperlipidemia   . Hypertension   . Tobacco abuse     Current Outpatient Medications:  .  acetaminophen (TYLENOL) 325 MG tablet, Take 2 tablets (650 mg total) by mouth every 6 (six) hours as needed for mild pain (or Fever >/= 101)., Disp: , Rfl:  .  amLODipine (NORVASC) 5 MG tablet, TAKE (1) TABLET BY MOUTH EVERY DAY, Disp: 90 tablet, Rfl: 1 .  blood glucose meter kit and supplies, Accuchek or whatever is covered by insurance; check sugars once a day only if desired; Dx E11.65, LON 99 months, Disp: 1 each, Rfl: 0 .  glucose blood test strip, Use as instructed to check sugars once a day if desired; LON 99 months, Dx E11.65, Disp: 100 each, Rfl: 3 .  irbesartan-hydrochlorothiazide (AVALIDE) 150-12.5 MG tablet, Take 1 tablet by mouth daily., Disp: 90 tablet, Rfl: 0 .   metFORMIN (GLUCOPHAGE) 1000 MG tablet, Take 1 tablet (1,000 mg total) by mouth 2 (two) times daily with a meal., Disp: 180 tablet, Rfl: 0 .  rosuvastatin (CRESTOR) 5 MG tablet, TAKE ONE TABLET EVERY DAY AT BEDTIME, Disp: 90 tablet, Rfl: 3 .  sitaGLIPtin (JANUVIA) 100 MG tablet, Take 1 tablet (100 mg total) by mouth daily., Disp: 90 tablet, Rfl: 3  Social History   Tobacco Use  Smoking Status Current Every Day Smoker  . Packs/day: 2.00  . Years: 64.00  . Pack years: 128.00  . Types: Cigarettes  Smokeless Tobacco Never Used    No Known Allergies Objective:  There were no vitals filed for this visit. There is no height or weight on file to calculate BMI. Constitutional Well developed. Well nourished.  Vascular Dorsalis pedis pulses palpable bilaterally. Posterior tibial pulses palpable bilaterally. Capillary refill normal to all digits.  No cyanosis or clubbing noted. Pedal hair growth normal.  Neurologic Normal speech. Oriented to person, place, and time. Epicritic sensation to light touch grossly present bilaterally.  Dermatologic  thickened elongated dystrophic mycotic discolored toenails x10.  Painful to touch. No open wounds. No skin lesions.  Orthopedic:  Right first tarsometatarsal joint arthritis.  Pain on palpation severely.  Pain along the dorsal midfoot as well.   Radiographs: 3 views of skeletally mature  adult foot: Severe arthritic changes noted of the first tarsometatarsal joint as well as the rest of the other tarsometatarsal joint.  Arthritis of the midfoot noted.  No other bony abnormalities identified.  There is exostosis present at the first tarsometatarsal joint. Assessment:   1. Arthritis of right foot   2. Diabetic polyneuropathy associated with type 2 diabetes mellitus (HCC)   3. Pain due to onychomycosis of toenail    Plan:  Patient was evaluated and treated and all questions answered.  Right midfoot arthritis/exostosis at first tarsometatarsal joint  slowly improving -I explained to the patient the etiology of arthritis and given that he has a history of previous Lisfranc injury is likely the etiology and the cause of the arthritic changes.  I believe patient will benefit from a steroid injection to decrease the inflammatory component associated with arthritis as well as swelling.  Patient agrees with the plan would like to proceed with injection. -A second steroid injection was performed at right dorsal first tarsometatarsal joint using 1% plain Lidocaine and 10 mg of Kenalog. This was well tolerated. -If the injection helps resolve the pain I do not mind doing the injections every 6 weeks to 8 weeks.  However if the pain is not improving will consider also different type of bracing to help with the pain.  Onychomycosis with pain  -Nails palliatively debrided as below. -Educated on self-care  Procedure: Nail Debridement Rationale: pain  Type of Debridement: manual, sharp debridement. Instrumentation: Nail nipper, rotary burr. Number of Nails: 10  Procedures and Treatment: Consent by patient was obtained for treatment procedures. The patient understood the discussion of treatment and procedures well. All questions were answered thoroughly reviewed. Debridement of mycotic and hypertrophic toenails, 1 through 5 bilateral and clearing of subungual debris. No ulceration, no infection noted.  Return Visit-Office Procedure: Patient instructed to return to the office for a follow up visit 3 months for continued evaluation and treatment.  Boneta Lucks, DPM    No follow-ups on file.   No follow-ups on file.

## 2020-02-22 ENCOUNTER — Ambulatory Visit: Payer: Medicare HMO | Admitting: Family Medicine

## 2020-02-25 ENCOUNTER — Other Ambulatory Visit: Payer: Self-pay

## 2020-02-25 ENCOUNTER — Ambulatory Visit (INDEPENDENT_AMBULATORY_CARE_PROVIDER_SITE_OTHER): Payer: Medicare HMO | Admitting: Family Medicine

## 2020-02-25 ENCOUNTER — Encounter: Payer: Self-pay | Admitting: Family Medicine

## 2020-02-25 VITALS — BP 126/82 | HR 64 | Temp 97.8°F | Resp 16 | Ht 68.0 in | Wt 143.3 lb

## 2020-02-25 DIAGNOSIS — E782 Mixed hyperlipidemia: Secondary | ICD-10-CM | POA: Diagnosis not present

## 2020-02-25 DIAGNOSIS — I10 Essential (primary) hypertension: Secondary | ICD-10-CM | POA: Diagnosis not present

## 2020-02-25 DIAGNOSIS — E119 Type 2 diabetes mellitus without complications: Secondary | ICD-10-CM

## 2020-02-25 DIAGNOSIS — Z720 Tobacco use: Secondary | ICD-10-CM | POA: Diagnosis not present

## 2020-02-25 DIAGNOSIS — R05 Cough: Secondary | ICD-10-CM

## 2020-02-25 DIAGNOSIS — J329 Chronic sinusitis, unspecified: Secondary | ICD-10-CM

## 2020-02-25 DIAGNOSIS — B351 Tinea unguium: Secondary | ICD-10-CM

## 2020-02-25 DIAGNOSIS — J440 Chronic obstructive pulmonary disease with acute lower respiratory infection: Secondary | ICD-10-CM

## 2020-02-25 DIAGNOSIS — J310 Chronic rhinitis: Secondary | ICD-10-CM | POA: Diagnosis not present

## 2020-02-25 DIAGNOSIS — J431 Panlobular emphysema: Secondary | ICD-10-CM | POA: Diagnosis not present

## 2020-02-25 DIAGNOSIS — E1169 Type 2 diabetes mellitus with other specified complication: Secondary | ICD-10-CM

## 2020-02-25 DIAGNOSIS — J209 Acute bronchitis, unspecified: Secondary | ICD-10-CM

## 2020-02-25 DIAGNOSIS — Z5181 Encounter for therapeutic drug level monitoring: Secondary | ICD-10-CM | POA: Diagnosis not present

## 2020-02-25 DIAGNOSIS — R058 Other specified cough: Secondary | ICD-10-CM

## 2020-02-25 MED ORDER — METFORMIN HCL 1000 MG PO TABS: 1000.0000 mg | ORAL_TABLET | Freq: Two times a day (BID) | ORAL | 3 refills | Status: DC

## 2020-02-25 MED ORDER — IRBESARTAN-HYDROCHLOROTHIAZIDE 150-12.5 MG PO TABS: 1.0000 | ORAL_TABLET | Freq: Every day | ORAL | 3 refills | Status: DC

## 2020-02-25 MED ORDER — BENZONATATE 100 MG PO CAPS: 100.0000 mg | ORAL_CAPSULE | Freq: Three times a day (TID) | ORAL | 0 refills | Status: DC | PRN

## 2020-02-25 MED ORDER — DOXYCYCLINE HYCLATE 100 MG PO TABS: 100.0000 mg | ORAL_TABLET | Freq: Two times a day (BID) | ORAL | 0 refills | Status: AC

## 2020-02-25 MED ORDER — AMLODIPINE BESYLATE 5 MG PO TABS: ORAL_TABLET | ORAL | 3 refills | Status: DC

## 2020-02-25 NOTE — Progress Notes (Signed)
Name: Connor Aguilar   MRN: [id number]    DOB: 09/04/1940   Date:02/25/2020       Progress Note  Chief Complaint  Patient presents with  . Diabetes    follow up, medication refills  . Hyperlipidemia  . Hypertension     Subjective:   Connor Aguilar is a 79 y.o. male, presents to clinic for   DM:   Currently managing with metformin 1000 mg and januvia, compliant, no SE or concerns with meds, not checking sugars, doesn't feel like its high or low Denies: Polyuria, polydipsia, vision changes, hypoglycemia Recent pertinent labs: Lab Results  Component Value Date   HGBA1C 6.5 (H) 10/23/2019   HGBA1C 7.1 (H) 09/27/2018   HGBA1C 7.1 (H) 05/16/2018   Standard of care and health maintenance: Foot exam:  Done March 2021 and seeing podiatry - fungal nails thick, yellowed painful. DM eye exam:  due   Hypertension:  Currently managed on irbesrtan-HCTZ and norvasc Pt reports good med compliance and denies any SE.  No lightheadedness, hypotension, syncope. Blood pressure today is well controlled. BP Readings from Last 3 Encounters:  02/25/20 126/82  10/23/19 110/64  09/27/18 124/78   Pt denies CP, exertional sx, LE edema, palpitation, Ha's, visual disturbances   Hyperlipidemia: Currently treated with crestor 73m, pt reports good med compliance Last Lipids: Lab Results  Component Value Date   CHOL 94 10/23/2019   HDL 42 10/23/2019   LDLCALC 29 10/23/2019   TRIG 145 10/23/2019   CHOLHDL 2.2 10/23/2019   - Denies: Chest pain, myalgias, claudication  He complains of continued foot and toe pain, seeing podiatry every 3 months  He also endorses sore throat, post nasal drip and cough for months that is gradually worsening.  He has a morning smokers cough for years - slightly worse over the past couple months.  Throat is raw feeling and burning/scratchy.  Cough is productive with sputum.  Some bad night when nose and throat are really irritated he sees some streaks of blood in his  morning cough/sputum.  He denies any unintentional weight loss, lymphadenopathy, night sweats, chest pain, or any exertional shortness of breath.  Current heavy smoker-2 packs a day, no desire to quit, history of pan lobar emphysema   Current Outpatient Medications:  .  acetaminophen (TYLENOL) 325 MG tablet, Take 2 tablets (650 mg total) by mouth every 6 (six) hours as needed for mild pain (or Fever >/= 101)., Disp: , Rfl:  .  amLODipine (NORVASC) 5 MG tablet, TAKE (1) TABLET BY MOUTH EVERY DAY, Disp: 90 tablet, Rfl: 1 .  irbesartan-hydrochlorothiazide (AVALIDE) 150-12.5 MG tablet, Take 1 tablet by mouth daily., Disp: 90 tablet, Rfl: 0 .  metFORMIN (GLUCOPHAGE) 1000 MG tablet, Take 1 tablet (1,000 mg total) by mouth 2 (two) times daily with a meal., Disp: 180 tablet, Rfl: 0 .  rosuvastatin (CRESTOR) 5 MG tablet, TAKE ONE TABLET EVERY DAY AT BEDTIME, Disp: 90 tablet, Rfl: 3 .  sitaGLIPtin (JANUVIA) 100 MG tablet, Take 1 tablet (100 mg total) by mouth daily., Disp: 90 tablet, Rfl: 3 .  blood glucose meter kit and supplies, Accuchek or whatever is covered by insurance; check sugars once a day only if desired; Dx E11.65, LON 99 months (Patient not taking: Reported on 02/25/2020), Disp: 1 each, Rfl: 0 .  glucose blood test strip, Use as instructed to check sugars once a day if desired; LON 99 months, Dx E11.65 (Patient not taking: Reported on 02/25/2020), Disp: 100 each, Rfl: 3  Patient Active Problem List   Diagnosis Date Noted  . Chronic arthropathy 10/25/2019  . Onychomycosis of multiple toenails with type 2 diabetes mellitus ([place]) 05/16/2018  . Erectile dysfunction 08/19/2015  . Tobacco abuse 04/09/2015  . Hyperlipidemia 04/02/2015  . Essential hypertension 04/02/2015  . Panlobular emphysema ([place]) 04/02/2015    Past Surgical History:  Procedure Laterality Date  . FOOT SURGERY      Family History  Problem Relation Age of Onset  . Diabetes Mother     Social History   Tobacco Use  .  Smoking status: Current Every Day Smoker    Packs/day: 2.00    Years: 64.00    Pack years: 128.00    Types: Cigarettes  . Smokeless tobacco: Never Used  Vaping Use  . Vaping Use: Never used  Substance Use Topics  . Alcohol use: No    Alcohol/week: 0.0 standard drinks  . Drug use: No     No Known Allergies  Health Maintenance  Topic Date Due  . OPHTHALMOLOGY EXAM  01/05/2020  . COVID-19 Vaccine (1) 03/12/2020 (Originally 12/20/1952)  . PNA vac Low Risk Adult (1 of 2 - PCV13) 10/22/2020 (Originally 12/20/2003)  . INFLUENZA VACCINE  03/09/2020  . HEMOGLOBIN A1C  04/24/2020  . FOOT EXAM  10/22/2020  . TETANUS/TDAP  07/26/2025    Chart Review Today: I personally reviewed active problem list, medication list, allergies, family history, social history, health maintenance, notes from last encounter, lab results, imaging with the patient/caregiver today.   Review of Systems  10 Systems reviewed and are negative for acute change except as noted in the HPI.  Objective:   Vitals:   02/25/20 1439  BP: 126/82  Pulse: 64  Resp: 16  Temp: 97.8 F (36.6 C)  TempSrc: Temporal  SpO2: 99%  Weight: 143 lb 4.8 oz (65 kg)  Height: '5\' 8"'$  (1.727 m)    Body mass index is 21.79 kg/m.  Physical Exam Vitals and nursing note reviewed.  Constitutional:      General: He is not in acute distress.    Appearance: Normal appearance. He is not toxic-appearing or diaphoretic.     Comments: Elderly male, appears stated age, no acute distress very potent odor of cigarettes  HENT:     Nose: Congestion and rhinorrhea present.     Mouth/Throat:     Pharynx: Posterior oropharyngeal erythema present. No oropharyngeal exudate.  Eyes:     General:        Right eye: No discharge.        Left eye: No discharge.     Conjunctiva/sclera: Conjunctivae normal.  Cardiovascular:     Rate and Rhythm: Normal rate and regular rhythm.     Pulses: Normal pulses.  Pulmonary:     Comments: Increased AP  diameter, kyphosis of thoracic spine, no tachypnea, accessory muscle use or retractions, diminished breath sounds throughout all lung fields, scattered rhonchi, no wheeze or rales auscultated Abdominal:     General: Bowel sounds are normal.     Palpations: Abdomen is soft.  Musculoskeletal:     Right lower leg: No edema.     Left lower leg: No edema.  Neurological:     Mental Status: He is alert. Mental status is at baseline.  Psychiatric:        Mood and Affect: Mood normal.        Behavior: Behavior normal.         Assessment & Plan:   1. Mixed hyperlipidemia Stable, well  controlled, compliant with statin, no SE or concerns Labs done a few months ago - were at goal Continue statin and healthy diet/lifestyle - COMPLETE METABOLIC PANEL WITH GFR  2. Type 2 diabetes mellitus without complication, without long-term current use of insulin (HCC) Has been well controlled, not checking CBGs, no SE or concerns with metformin and januvia  - Hemoglobin A1c - COMPLETE METABOLIC PANEL WITH GFR  3. Encounter for medication monitoring - Hemoglobin A1c - COMPLETE METABOLIC PANEL WITH GFR - amLODipine (NORVASC) 5 MG tablet; TAKE (1) TABLET BY MOUTH EVERY DAY  Dispense: 90 tablet; Refill: 3 - irbesartan-hydrochlorothiazide (AVALIDE) 150-12.5 MG tablet; Take 1 tablet by mouth daily.  Dispense: 90 tablet; Refill: 3 - metFORMIN (GLUCOPHAGE) 1000 MG tablet; Take 1 tablet (1,000 mg total) by mouth 2 (two) times daily with a meal.  Dispense: 180 tablet; Refill: 3  4. Essential hypertension Stable, well controlled on current meds, refills given, recheck labs and then 6 month f/up - COMPLETE METABOLIC PANEL WITH GFR - amLODipine (NORVASC) 5 MG tablet; TAKE (1) TABLET BY MOUTH EVERY DAY  Dispense: 90 tablet; Refill: 3 - irbesartan-hydrochlorothiazide (AVALIDE) 150-12.5 MG tablet; Take 1 tablet by mouth daily.  Dispense: 90 tablet; Refill: 3  5. Tobacco abuse No desire to quit, resources offered  and politely declined  6. Panlobular emphysema ([place]) See above, denies any severe or limiting sx  7. Onychomycosis of multiple toenails with type 2 diabetes mellitus ([place]) -Per podiatry, I offered a referral for second opinion and encouraged him to follow-up with podiatry.  8. COPD with acute bronchitis ([place]) He endorses worsening productive cough in the mornings with intermittent blood streaks in tinges he initially complained of very bothersome and gradually worsening sore throat for years and then added nasal symptoms, postnasal drip, bothersome symptoms at night and first thing in the morning.  Unclear if any of the scant streaks of blood are coming from nose throat or upper airways, his throat was not terribly erythematous, lungs with diminished breath sounds throughout increased AP diameter did not hear any rales or wheeze intermittent and scattered rhonchi -will treat with doxycycline due to prolonged nasal symptoms, throat symptoms and pulmonary symptoms (he endorses over 2 months of symptoms do feel duration of symptoms makes antibiotics appropriate) - doxycycline (VIBRA-TABS) 100 MG tablet; Take 1 tablet (100 mg total) by mouth 2 (two) times daily for 7 days.  Dispense: 14 tablet; Refill: 0 - DG Chest 2 View; Future - benzonatate (TESSALON) 100 MG capsule; Take 1 capsule (100 mg total) by mouth 3 (three) times daily as needed for cough.  Dispense: 30 capsule; Refill: 0  9. Rhinosinusitis Patient also encouraged to use antihistamines and nasal sprays to treat nasal discharge, congestion and postnasal drip this is likely irritating his throat and upper airways - doxycycline (VIBRA-TABS) 100 MG tablet; Take 1 tablet (100 mg total) by mouth 2 (two) times daily for 7 days.  Dispense: 14 tablet; Refill: 0  10. Cough productive of purulent sputum - DG Chest 2 View; Future - benzonatate (TESSALON) 100 MG capsule; Take 1 capsule (100 mg total) by mouth 3 (three) times daily as needed for cough.   Dispense: 30 capsule; Refill: 0   Patient mentioned blood streaks I offered to get imaging, work-up his complaint possibly get him to a pulmonologist.  He would like to treat his URI symptoms first and work-up further only if not improving Patient encouraged to follow-up in 1 to 2 weeks if not improving (with nasal symptoms  sore throat and cough and congestion)  Return in about 6 months (around 08/27/2020) for Routine follow-up.   Delsa Grana, PA-C 02/25/20 2:50 PM

## 2020-02-25 NOTE — Patient Instructions (Signed)
Go across the street and do the chest x-ray Take mucinex and try the cough meds Take doxycycline  - take with food in stomach, drink a full glass of water afterwards and do not lay down  Follow up if your cough or throat are not improving in the next 1-2 weeks.

## 2020-02-26 LAB — HEMOGLOBIN A1C
Hgb A1c MFr Bld: 5.7 % of total Hgb — ABNORMAL HIGH (ref <5.7)
Mean Plasma Glucose: 117 (calc)
eAG (mmol/L): 6.5 (calc)

## 2020-02-26 LAB — COMPLETE METABOLIC PANEL WITH GFR
AG Ratio: 1.8 (calc) (ref 1.0–2.5)
ALT: 12 U/L (ref 9–46)
AST: 12 U/L (ref 10–35)
Albumin: 4.2 g/dL (ref 3.6–5.1)
Alkaline phosphatase (APISO): 37 U/L (ref 35–144)
BUN: 12 mg/dL (ref 7–25)
CO2: 28 mmol/L (ref 20–32)
Calcium: 9.7 mg/dL (ref 8.6–10.3)
Chloride: 97 mmol/L — ABNORMAL LOW (ref 98–110)
Creat: 1 mg/dL (ref 0.70–1.11)
GFR, Est African American: 81 mL/min/{1.73_m2} (ref >=60)
GFR, Est Non African American: 70 mL/min/{1.73_m2} (ref >=60)
Globulin: 2.4 g/dL (calc) (ref 1.9–3.7)
Glucose, Bld: 101 mg/dL — ABNORMAL HIGH (ref 65–99)
Potassium: 4.1 mmol/L (ref 3.5–5.3)
Sodium: 139 mmol/L (ref 135–146)
Total Bilirubin: 0.5 mg/dL (ref 0.2–1.2)
Total Protein: 6.6 g/dL (ref 6.1–8.1)

## 2020-02-27 ENCOUNTER — Telehealth: Payer: Self-pay

## 2020-02-27 NOTE — Telephone Encounter (Signed)
Copied from [place] [phone]. Topic: General - Other >> Feb 27, 2020 11:30 AM Rainey Pines A wrote: Patient called to get message back to PCP that he wants to know what he can do to help him in gaining weight. Patient feels that he is losing too much weight. Please advise

## 2020-02-28 NOTE — Telephone Encounter (Signed)
Pt to see Dr Ancil Boozer on Monday 03-03-2020

## 2020-03-03 ENCOUNTER — Ambulatory Visit: Payer: Medicare HMO | Admitting: Family Medicine

## 2020-03-04 ENCOUNTER — Ambulatory Visit (INDEPENDENT_AMBULATORY_CARE_PROVIDER_SITE_OTHER): Payer: Medicare HMO | Admitting: Family Medicine

## 2020-03-04 ENCOUNTER — Encounter: Payer: Self-pay | Admitting: Family Medicine

## 2020-03-04 ENCOUNTER — Telehealth: Payer: Self-pay

## 2020-03-04 ENCOUNTER — Other Ambulatory Visit: Payer: Self-pay

## 2020-03-04 VITALS — BP 130/60 | HR 104 | Temp 97.3°F | Resp 16 | Ht 68.0 in | Wt 139.2 lb

## 2020-03-04 DIAGNOSIS — J42 Unspecified chronic bronchitis: Secondary | ICD-10-CM | POA: Diagnosis not present

## 2020-03-04 DIAGNOSIS — Z23 Encounter for immunization: Secondary | ICD-10-CM

## 2020-03-04 DIAGNOSIS — R634 Abnormal weight loss: Secondary | ICD-10-CM | POA: Diagnosis not present

## 2020-03-04 DIAGNOSIS — E440 Moderate protein-calorie malnutrition: Secondary | ICD-10-CM | POA: Diagnosis not present

## 2020-03-04 DIAGNOSIS — R042 Hemoptysis: Secondary | ICD-10-CM | POA: Diagnosis not present

## 2020-03-04 DIAGNOSIS — R059 Cough, unspecified: Secondary | ICD-10-CM

## 2020-03-04 DIAGNOSIS — R1310 Dysphagia, unspecified: Secondary | ICD-10-CM

## 2020-03-04 DIAGNOSIS — R1319 Other dysphagia: Secondary | ICD-10-CM

## 2020-03-04 DIAGNOSIS — R05 Cough: Secondary | ICD-10-CM | POA: Diagnosis not present

## 2020-03-04 DIAGNOSIS — Z720 Tobacco use: Secondary | ICD-10-CM

## 2020-03-04 NOTE — Progress Notes (Signed)
Name: Connor Aguilar   MRN: [id number]    DOB: 1941/07/12   Date:03/04/2020       Progress Note  Subjective  Chief Complaint  Chief Complaint  Patient presents with  . Weight Gain  . Sore Throat    Continues to have sore throat. Antibiotics helped but he still has a little soreness.    HPI  Abnormal weight loss/ malnutrition: his weight back in 09/2018 was 173 lbs ( last recorded weight from last year because of pandemic)  and is down to 139 lbs today ( 34 lbs weight loss)  -over 10 % of weight loss without trying - he states started with lack of appetite, odynophagia  to solids and liquids about 6 months ago. His son asked him to find out the cause. He is independent, lives alone , drives, cleans his house. He does not cook and eats out, he has been going out to eat but only takes a couple bites and feels full. He denies nausea or vomiting. No change in bowel movements, no blood in stools. He has been a smoker all his life, currently smoking about 1.5 pack per day, he has noticed some streaks of blood on his sputum over the past few months. No wheezing or sob. Denies fatigue. He is still working daily cutting trees    Patient Active Problem List   Diagnosis Date Noted  . Chronic arthropathy 10/25/2019  . Onychomycosis of multiple toenails with type 2 diabetes mellitus ([place]) 05/16/2018  . Erectile dysfunction 08/19/2015  . Tobacco abuse 04/09/2015  . Hyperlipidemia 04/02/2015  . Essential hypertension 04/02/2015  . Panlobular emphysema ([place]) 04/02/2015    Past Surgical History:  Procedure Laterality Date  . FOOT SURGERY      Family History  Problem Relation Age of Onset  . Diabetes Mother     Social History   Tobacco Use  . Smoking status: Current Every Day Smoker    Packs/day: 2.00    Years: 64.00    Pack years: 128.00    Types: Cigarettes  . Smokeless tobacco: Never Used  Substance Use Topics  . Alcohol use: No    Alcohol/week: 0.0 standard drinks     Current  Outpatient Medications:  .  acetaminophen (TYLENOL) 325 MG tablet, Take 2 tablets (650 mg total) by mouth every 6 (six) hours as needed for mild pain (or Fever >/= 101)., Disp: , Rfl:  .  amLODipine (NORVASC) 5 MG tablet, TAKE (1) TABLET BY MOUTH EVERY DAY, Disp: 90 tablet, Rfl: 3 .  benzonatate (TESSALON) 100 MG capsule, Take 1 capsule (100 mg total) by mouth 3 (three) times daily as needed for cough., Disp: 30 capsule, Rfl: 0 .  blood glucose meter kit and supplies, Accuchek or whatever is covered by insurance; check sugars once a day only if desired; Dx E11.65, LON 99 months, Disp: 1 each, Rfl: 0 .  glucose blood test strip, Use as instructed to check sugars once a day if desired; LON 99 months, Dx E11.65, Disp: 100 each, Rfl: 3 .  irbesartan-hydrochlorothiazide (AVALIDE) 150-12.5 MG tablet, Take 1 tablet by mouth daily., Disp: 90 tablet, Rfl: 3 .  metFORMIN (GLUCOPHAGE) 1000 MG tablet, Take 1 tablet (1,000 mg total) by mouth 2 (two) times daily with a meal., Disp: 180 tablet, Rfl: 3 .  rosuvastatin (CRESTOR) 5 MG tablet, TAKE ONE TABLET EVERY DAY AT BEDTIME, Disp: 90 tablet, Rfl: 3 .  sitaGLIPtin (JANUVIA) 100 MG tablet, Take 1 tablet (100 mg total) by mouth  daily., Disp: 90 tablet, Rfl: 3  No Known Allergies  I personally reviewed active problem list, medication list, allergies, family history, social history, health maintenance with the patient/caregiver today.   ROS  Ten systems reviewed and is negative except as mentioned in HPI   Objective  Vitals:   03/04/20 1019  BP: (!) 130/60  Pulse: 104  Resp: 16  Temp: (!) 97.3 F (36.3 C)  TempSrc: Temporal  SpO2: 100%  Weight: 139 lb 3.2 oz (63.1 kg)  Height: 5' 8" (1.727 m)    Body mass index is 21.17 kg/m.  Physical Exam  Constitutional: Patient appears well-developed and very thin, temporal waisting   No distress.  HEENT: head atraumatic, normocephalic, pupils equal and reactive to light Cardiovascular: Normal rate,  regular rhythm and normal heart sounds.  No murmur heard. No BLE edema. Pulmonary/Chest: Effort normal he has bilateral rhonchi . No respiratory distress. Abdominal: Soft.  There is no tenderness. Psychiatric: Patient has a normal mood and affect. behavior is normal. Judgment and thought content normal.  Recent Results (from the past 2160 hour(s))  Hemoglobin A1c     Status: Abnormal   Collection Time: 02/25/20  3:03 PM  Result Value Ref Range   Hgb A1c MFr Bld 5.7 (H) <5.7 % of total Hgb    Comment: For someone without known diabetes, a hemoglobin  A1c value between 5.7% and 6.4% is consistent with prediabetes and should be confirmed with a  follow-up test. . For someone with known diabetes, a value <7% indicates that their diabetes is well controlled. A1c targets should be individualized based on duration of diabetes, age, comorbid conditions, and other considerations. . This assay result is consistent with an increased risk of diabetes. . Currently, no consensus exists regarding use of hemoglobin A1c for diagnosis of diabetes for children. .    Mean Plasma Glucose 117 (calc)   eAG (mmol/L) 6.5 (calc)  COMPLETE METABOLIC PANEL WITH GFR     Status: Abnormal   Collection Time: 02/25/20  3:03 PM  Result Value Ref Range   Glucose, Bld 101 (H) 65 - 99 mg/dL    Comment: .            Fasting reference interval . For someone without known diabetes, a glucose value between 100 and 125 mg/dL is consistent with prediabetes and should be confirmed with a follow-up test. .    BUN 12 7 - 25 mg/dL   Creat 1.00 0.70 - 1.11 mg/dL    Comment: For patients >41 years of age, the reference limit for Creatinine is approximately 13% higher for people identified as African-American. .    GFR, Est Non African American 70 > OR = 60 mL/min/1.39m   GFR, Est African American 81 > OR = 60 mL/min/1.723m  BUN/Creatinine Ratio NOT APPLICABLE 6 - 22 (calc)   Sodium 139 135 - 146 mmol/L    Potassium 4.1 3.5 - 5.3 mmol/L   Chloride 97 (L) 98 - 110 mmol/L   CO2 28 20 - 32 mmol/L   Calcium 9.7 8.6 - 10.3 mg/dL   Total Protein 6.6 6.1 - 8.1 g/dL   Albumin 4.2 3.6 - 5.1 g/dL   Globulin 2.4 1.9 - 3.7 g/dL (calc)   AG Ratio 1.8 1.0 - 2.5 (calc)   Total Bilirubin 0.5 0.2 - 1.2 mg/dL   Alkaline phosphatase (APISO) 37 35 - 144 U/L   AST 12 10 - 35 U/L   ALT 12 9 - 46 U/L  PHQ2/9: Depression screen [facility] 2/9 03/04/2020 02/25/2020 10/23/2019 06/15/2019 03/28/2019  Decreased Interest 0 0 0 1 0  Down, Depressed, Hopeless 0 0 0 1 0  PHQ - 2 Score 0 0 0 2 0  Altered sleeping 0 0 0 0 0  Tired, decreased energy 0 0 0 0 0  Change in appetite 0 0 0 0 0  Feeling bad or failure about yourself  0 0 0 0 0  Trouble concentrating 0 0 0 0 0  Moving slowly or fidgety/restless 0 0 0 0 0  Suicidal thoughts 0 0 0 0 0  PHQ-9 Score 0 0 0 2 0  Difficult doing work/chores - Not difficult at all Not difficult at all Not difficult at all Not difficult at all  Some recent data might be hidden    phq 9 is negative   Fall Risk: Fall Risk  03/04/2020 02/25/2020 10/23/2019 06/15/2019 03/28/2019  Falls in the past year? 0 0 0 0 0  Number falls in past yr: 0 0 0 0 0  Injury with Fall? 0 0 0 0 0  Risk for fall due to : - - - - -  Risk for fall due to: Comment - - - - -  Follow up - Falls evaluation completed - Falls prevention discussed -     Assessment & Plan  1. Unintentional weight loss  - CT Chest Wo Contrast; Future - Ambulatory referral to Gastroenterology  2. Moderate protein-calorie malnutrition ([place])  - CT Chest Wo Contrast; Future - Ambulatory referral to Gastroenterology  3. Chronic bronchitis, unspecified chronic bronchitis type ([place])  - CT Chest Wo Contrast; Future - Ambulatory referral to Gastroenterology  4. Tobacco use  - CT Chest Wo Contrast; Future - Ambulatory referral to Gastroenterology  5. Bloody sputum  - CT Chest Wo Contrast; Future - Ambulatory referral to  Gastroenterology  6. Cough  - CT Chest Wo Contrast; Future - Ambulatory referral to Gastroenterology  7. Esophageal dysphagia  - Ambulatory referral to Gastroenterology  8. Need for pneumococcal vaccine  - Pneumococcal conjugate vaccine 13-valent IM

## 2020-03-05 NOTE — Telephone Encounter (Signed)
I called this patient to informed him that he has been scheduled to have his CT of the chest on Thursday, August 12th at [facility] at [facility]. He was asked to arrive by 8:30am so that he can be screened and to get registered. Per his request, I repeated the information to Anne Ng so that she was able to write everything down. I also gave her the number to centralized scheduling [phone]) in case they needed to cancel or reschedule.

## 2020-03-20 ENCOUNTER — Ambulatory Visit
Admission: RE | Admit: 2020-03-20 | Discharge: 2020-03-20 | Disposition: A | Discharge status: Home / Self Care | Payer: Medicare HMO | Source: Ambulatory Visit | Attending: Family Medicine | Admitting: Family Medicine

## 2020-03-20 ENCOUNTER — Other Ambulatory Visit: Payer: Self-pay

## 2020-03-20 DIAGNOSIS — Z720 Tobacco use: Secondary | ICD-10-CM

## 2020-03-20 DIAGNOSIS — J42 Unspecified chronic bronchitis: Secondary | ICD-10-CM

## 2020-03-20 DIAGNOSIS — R042 Hemoptysis: Secondary | ICD-10-CM | POA: Diagnosis not present

## 2020-03-20 DIAGNOSIS — I700 Atherosclerosis of aorta: Secondary | ICD-10-CM | POA: Diagnosis not present

## 2020-03-20 DIAGNOSIS — I2510 Atherosclerotic heart disease of native coronary artery without angina pectoris: Secondary | ICD-10-CM | POA: Diagnosis not present

## 2020-03-20 DIAGNOSIS — R059 Cough, unspecified: Secondary | ICD-10-CM

## 2020-03-20 DIAGNOSIS — R634 Abnormal weight loss: Secondary | ICD-10-CM

## 2020-03-20 DIAGNOSIS — R05 Cough: Secondary | ICD-10-CM | POA: Diagnosis not present

## 2020-03-20 DIAGNOSIS — E440 Moderate protein-calorie malnutrition: Secondary | ICD-10-CM

## 2020-03-20 DIAGNOSIS — S2222XD Fracture of body of sternum, subsequent encounter for fracture with routine healing: Secondary | ICD-10-CM | POA: Diagnosis not present

## 2020-03-20 DIAGNOSIS — J8410 Pulmonary fibrosis, unspecified: Secondary | ICD-10-CM | POA: Diagnosis not present

## 2020-03-21 ENCOUNTER — Telehealth: Payer: Self-pay

## 2020-03-21 NOTE — Telephone Encounter (Signed)
Copied from [place] [phone]. Topic: General - Other >> Mar 21, 2020 12:25 PM Oneta Rack wrote: Reason for CRM: patient inquiring about ct imaging results and would like a follow up call today [phone]

## 2020-03-25 NOTE — Telephone Encounter (Signed)
Copied from [place] [id number]. Topic: General - Other >> Mar 21, 2020 12:25 PM Oneta Rack wrote: Reason for CRM: patient inquiring about ct imaging results and would like a follow up call today [phone] >> Mar 21, 2020  4:30 PM Keene Breath wrote: Patient is calling again to request the results of his Ct imaging test.  Please call patient as soon as results are in.

## 2020-03-26 NOTE — Telephone Encounter (Signed)
Please respond to pt request for his imaging results. He looks to have been calling repeatedly since the 13th. He seems very concerned at this point [phone]

## 2020-03-26 NOTE — Telephone Encounter (Signed)
Elliot Cousin, RN  03/24/2020 6:48 PM EDT     Result note read to patient, verbalizes understanding. Agreeable to referral.

## 2020-04-01 ENCOUNTER — Telehealth: Payer: Self-pay | Admitting: Emergency Medicine

## 2020-04-01 NOTE — Telephone Encounter (Signed)
Copied from [place] [phone]. Topic: General - Inquiry >> Mar 31, 2020  9:10 AM Greggory Keen D wrote: Reason for CRM: Pt called saying he has ask for something to help him gain weight.  Pt states he has has a sore throat that he came in for and had a CT scan of chest and neck and he is still having a sore throat and can't eat  CB#  [phone]

## 2020-04-02 NOTE — Telephone Encounter (Signed)
Pt called back to speak with Crystal as he got disconnected. Please advise.

## 2020-04-02 NOTE — Telephone Encounter (Signed)
Miel noted that pt said there was blood tinge in one episode of spitting up, no clots, no coffee grounds, pt has since eaten and tolerated PO  As long as no worsening sx, large amounts of blood clots or coffee grounds  - then pt can wait for reponse from DR. Sowles.  ER precautions were reviewed with the pt, and he was working with his crew  I have communicated the circumstances with Dr. Ancil Boozer as well

## 2020-04-02 NOTE — Telephone Encounter (Signed)
Called patient back after getting disconnected. He reports that he is unable to eat and has been spitting up blood. He said he needs to be seen now and he does not know which one he needs to be seen at ENT or GI, he would like for you to make that decision as soon as possible.

## 2020-04-02 NOTE — Telephone Encounter (Signed)
Spoke with patient per provider Delsa Grana and informed patient that he needs to go to the ER.  Patient stated that he couldn't go today due to working, but will go in the morning.  Patient stated that he only spit up a little blood this morning and hasn't had that issue since this morning. Patient also stated that he has been able to eat and drink today.  Informed patient that if he spits up blood again he needs to proceed to the ER immediately.  Patient verbalized understanding.

## 2020-04-02 NOTE — Telephone Encounter (Signed)
Spoke with patient he reports that he is spitting up blood. Did not say whether he wanted to see ENT or GI. Please advise.

## 2020-04-03 NOTE — Telephone Encounter (Signed)
Patient was advised to go to ED. He does not want to go. He said he will wait til his appointment.

## 2020-04-04 ENCOUNTER — Other Ambulatory Visit: Payer: Self-pay | Admitting: Family Medicine

## 2020-04-04 DIAGNOSIS — R634 Abnormal weight loss: Secondary | ICD-10-CM

## 2020-04-04 DIAGNOSIS — J440 Chronic obstructive pulmonary disease with acute lower respiratory infection: Secondary | ICD-10-CM

## 2020-04-04 DIAGNOSIS — R042 Hemoptysis: Secondary | ICD-10-CM

## 2020-04-04 DIAGNOSIS — J209 Acute bronchitis, unspecified: Secondary | ICD-10-CM

## 2020-04-04 DIAGNOSIS — Z720 Tobacco use: Secondary | ICD-10-CM

## 2020-04-21 ENCOUNTER — Encounter: Payer: Self-pay | Admitting: Family Medicine

## 2020-04-21 ENCOUNTER — Ambulatory Visit (INDEPENDENT_AMBULATORY_CARE_PROVIDER_SITE_OTHER): Payer: Medicare HMO | Admitting: Family Medicine

## 2020-04-21 ENCOUNTER — Ambulatory Visit
Admission: RE | Admit: 2020-04-21 | Discharge: 2020-04-21 | Disposition: A | Discharge status: Home / Self Care | Payer: Medicare HMO | Source: Ambulatory Visit | Attending: Family Medicine | Admitting: Family Medicine

## 2020-04-21 ENCOUNTER — Ambulatory Visit
Admission: RE | Admit: 2020-04-21 | Discharge: 2020-04-21 | Disposition: A | Discharge status: Home / Self Care | Payer: Medicare HMO | Attending: Family Medicine | Admitting: Family Medicine

## 2020-04-21 ENCOUNTER — Other Ambulatory Visit: Payer: Self-pay

## 2020-04-21 VITALS — BP 112/74 | HR 106 | Temp 98.6°F | Resp 16 | Ht 68.0 in | Wt 131.4 lb

## 2020-04-21 DIAGNOSIS — I10 Essential (primary) hypertension: Secondary | ICD-10-CM

## 2020-04-21 DIAGNOSIS — R042 Hemoptysis: Secondary | ICD-10-CM | POA: Diagnosis not present

## 2020-04-21 DIAGNOSIS — R630 Anorexia: Secondary | ICD-10-CM

## 2020-04-21 DIAGNOSIS — R0781 Pleurodynia: Secondary | ICD-10-CM | POA: Diagnosis not present

## 2020-04-21 DIAGNOSIS — Z5181 Encounter for therapeutic drug level monitoring: Secondary | ICD-10-CM

## 2020-04-21 DIAGNOSIS — R634 Abnormal weight loss: Secondary | ICD-10-CM | POA: Diagnosis not present

## 2020-04-21 DIAGNOSIS — E1169 Type 2 diabetes mellitus with other specified complication: Secondary | ICD-10-CM | POA: Diagnosis not present

## 2020-04-21 DIAGNOSIS — R000 Tachycardia, unspecified: Secondary | ICD-10-CM

## 2020-04-21 DIAGNOSIS — R0789 Other chest pain: Secondary | ICD-10-CM

## 2020-04-21 DIAGNOSIS — Z720 Tobacco use: Secondary | ICD-10-CM

## 2020-04-21 MED ORDER — IRBESARTAN 150 MG PO TABS: 150.0000 mg | ORAL_TABLET | Freq: Every day | ORAL | 3 refills | Status: DC

## 2020-04-21 MED ORDER — PANTOPRAZOLE SODIUM 40 MG PO TBEC: 40.0000 mg | DELAYED_RELEASE_TABLET | Freq: Every day | ORAL | 3 refills | Status: DC

## 2020-04-21 MED ORDER — METFORMIN HCL 1000 MG PO TABS: 1000.0000 mg | ORAL_TABLET | Freq: Every day | ORAL | 3 refills | Status: DC

## 2020-04-21 NOTE — Patient Instructions (Addendum)
Start taking only metformin 1000 mg once a day  Stop your irbesartan-HCTZ and start irbesartan when you get it from the pharmacy  Try to eat food high in protein.    Protein-Energy Malnutrition Protein-energy malnutrition is when a person does not eat enough protein, fat, and calories. When this happens over time, it can lead to severe loss of muscle tissue (muscle wasting). This condition also affects the body's defense system (immune system) and can lead to other health problems. What are the causes? This condition may be caused by:  Not eating enough protein, fat, or calories.  Having certain chronic medical conditions.  Eating too little. What increases the risk? The following factors may make you more likely to develop this condition:  Living in poverty.  Long-term hospitalization.  Alcohol or drug dependency. Addiction often leads to a lifestyle in which proper diet is ignored. Dependency can also hurt the metabolism and the body's ability to absorb nutrients.  Eating disorders, such as anorexia nervosa or bulimia.  Chewing or swallowing problems. People with these disorders may not eat enough.  Having certain conditions, such as: ? Inflammatory bowel disease. Inflammation of the intestines makes it difficult for the body to absorb nutrients. ? Cancer or AIDS. These diseases can cause a loss of appetite. ? Chronic heart failure. This interferes with how the body uses nutrients. ? Cystic fibrosis. This disease can make it difficult for the body to absorb nutrients.  Eating a diet that extremely restricts protein, fat, or calorie intake. What are the signs or symptoms? Symptoms of this condition include:  Fatigue.  Weakness.  Dizziness.  Fainting.  Weight loss.  Loss of muscle tone and muscle mass.  Poor immune response.  Lack of menstruation.  Poor memory.  Hair loss.  Skin changes. How is this diagnosed? This condition may be diagnosed based  on:  Your medical and dietary history.  A physical exam. This may include a measurement of your body mass index (BMI).  Blood tests. How is this treated? This condition may be managed with:  Nutrition therapy. This may include working with a diet and nutrition specialist (dietitian).  Treatment for underlying conditions. People with severe protein-energy malnutrition may need to be treated in a hospital. This may involve receiving nutrition and fluids through an IV. Follow these instructions at home:   Eat a balanced diet. In each meal, include at least one food that is high in protein. Foods that are high in protein include: ? Meat. ? Poultry. ? Fish. ? Eggs. ? Cheese. ? Milk. ? Beans. ? Nuts.  Eat nutrient-rich foods that are easy to swallow and digest, such as: ? Fruit and yogurt smoothies. ? Oatmeal with nut butter.  Try to eat six small meals each day instead of three large meals.  Take vitamin and protein supplements as told by your health care provider or dietitian.  Follow your health care provider's recommendations about exercise and activity.  Keep all follow-up visits as told by your health care provider. This is important. Contact a health care provider if you:  Have increased weakness or fatigue.  Faint.  Are a woman and you stop having your period (menstruating).  Have rapid hair loss.  Have unexpected weight loss.  Have diarrhea.  Have nausea and vomiting. Get help right away if you have:  Difficulty breathing.  Chest pain. Summary  Protein-energy malnutrition is when a person does not eat enough protein, fat, and calories.  Protein-energy malnutrition can lead to severe  loss of muscle tissue (muscle wasting). This condition also affects the body's defense system (immune system) and can lead to other health problems.  Talk with your health care provider about treatment for this condition. Effective treatment depends on the underlying  cause of the malnutrition. This information is not intended to replace advice given to you by your health care provider. Make sure you discuss any questions you have with your health care provider. Document Revised: 08/10/2017 Document Reviewed: 08/10/2017 Elsevier Patient Education  2020 [employer].

## 2020-04-21 NOTE — Progress Notes (Signed)
Name: Connor Aguilar   MRN: [id number]    DOB: 1941-05-28   Date:04/21/2020       Progress Note  Chief Complaint  Patient presents with  . Weight Check  . COPD  . Fall    tripped fell on 9/11 hurt left side of ribs no bruising but bruised left hand.  Pt states very painful on side     Subjective:   Connor Aguilar is a 79 y.o. male, presents to clinic for unintentional weight loss, hemoptysis f/up and recent fall.  I last saw patient about 2 months ago for his routine follow-up. Since that time he has seen Dr. Ancil Boozer for various symptoms including sore throat, difficulty swallowing, unintentional weight loss. She had ordered a CT chest Patient had noted difficulty swallowing, decreased appetite, sensation of feeling full very easily after only a few bites of food, he has a longtime heavy smoker, he also had reported blood-tinged sputum. 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 CT of the chest was done on 03/20/2020-significant for three-vessel coronary artery atherosclerosis, several calcified mediastinal and bilateral hilar lymph nodes, lungs showed no focal airspace consolidation or effusion some subtle nodularity along the minor fissure and tiny calcified granulomas to left lower lobe, abdominal aortic atherosclerosis and a incidental 1.6 cm right adrenal adenoma. Also a sternal body subacute to chronic healing fracture -he had trauma to the area years ago  Patient presents today states that he has left-sided rib pain secondary to fall. He reports a mechanical fall that occurred about 2 days ago after walking on uneven sidewalk/steps, he fell up one step onto concrete impact mostly to left side-he has some abrasions and small skin tears to bilateral hands some bruising to the top of his left hand and wrist and pain without bruising to his left ribs. He does hurt to breathe, he denies any cough shortness of  breath, fever, chills, sweats.    He reports significant loss of appetite, he has not eaten yet today, he denies dysphagia or choking, denies any acid reflux or abdominal pain, cramping, nausea, vomiting, diarrhea. He continues to state that he simply does not feel hungry. When asking him about what he eats or is willing to eat he states he will not drink milk and refuses to try Ensure or boost but he is willing to talk to a dietitian or nutritionist He has continued to lose weight down another 8 pounds since his last office visit here. Wt Readings from Last 5 Encounters:  04/21/20 131 lb 6.4 oz (59.6 kg)  03/04/20 139 lb 3.2 oz (63.1 kg)  02/25/20 143 lb 4.8 oz (65 kg)  10/23/19 158 lb 3.2 oz (71.8 kg)  09/27/18 173 lb 6.4 oz (78.7 kg)   BMI Readings from Last 5 Encounters:  04/21/20 19.98 kg/m  03/04/20 21.17 kg/m  02/25/20 21.79 kg/m  10/23/19 24.05 kg/m  09/27/18 26.37 kg/m   COPD? History of chronic bronchitis, current smoker 1.5 packs/day and sometimes more than that he states that he gets up early in because he works all day he is smoking more and has for many decades. He is currently scheduled to follow-up with pulmonology. He is not currently managed with any inhalers and in reviewing his chart history can only see [facility] more than 4 to 5 years ago, and he is occasionally been prescribed cough medicines.  About 2 weeks ago patient had called reporting that  he was spitting up blood -had also continued to have sore throat for a month after his last visit and was continuing to lose weight wants to know if there is anything he could take to gain weight, this was routed to Dr. Ancil Boozer would recently seen him for the same complaints -she did refer him to GI/ENT and later advised the patient that he would need to come in for evaluation with multiple new symptoms  Currently patient is here with family member his daughter, they do have an appointment with GI specialist and pulmonology the  next month.  Hypertension:  Currently managed on norvasc 5 mg and irbesartan HCTZ  Blood pressure is well controlled he denies any lightheaded episodes, syncopal episodes. Blood pressures slightly lower than his baseline BP Readings from Last 3 Encounters:  04/21/20 112/74  03/04/20 (!) 130/60  02/25/20 126/82    DM:   His diabetes has been well controlled with metformin 1000 mg twice a day and Januvia 100 mg daily, he is not checking his blood sugars, A1c was last checked about 2 months ago was 5.7 Denies: Polyuria, polydipsia, vision changes, neuropathy, hypoglycemia Recent pertinent labs: Lab Results  Component Value Date   HGBA1C 5.7 (H) 02/25/2020   HGBA1C 6.5 (H) 10/23/2019   HGBA1C 7.1 (H) 09/27/2018         Current Outpatient Medications:  .  acetaminophen (TYLENOL) 325 MG tablet, Take 2 tablets (650 mg total) by mouth every 6 (six) hours as needed for mild pain (or Fever >/= 101)., Disp: , Rfl:  .  amLODipine (NORVASC) 5 MG tablet, TAKE (1) TABLET BY MOUTH EVERY DAY, Disp: 90 tablet, Rfl: 3 .  benzonatate (TESSALON) 100 MG capsule, Take 1 capsule (100 mg total) by mouth 3 (three) times daily as needed for cough., Disp: 30 capsule, Rfl: 0 .  blood glucose meter kit and supplies, Accuchek or whatever is covered by insurance; check sugars once a day only if desired; Dx E11.65, LON 99 months, Disp: 1 each, Rfl: 0 .  glucose blood test strip, Use as instructed to check sugars once a day if desired; LON 99 months, Dx E11.65, Disp: 100 each, Rfl: 3 .  metFORMIN (GLUCOPHAGE) 1000 MG tablet, Take 1 tablet (1,000 mg total) by mouth daily with breakfast., Disp: 180 tablet, Rfl: 3 .  rosuvastatin (CRESTOR) 5 MG tablet, TAKE ONE TABLET EVERY DAY AT BEDTIME, Disp: 90 tablet, Rfl: 3 .  sitaGLIPtin (JANUVIA) 100 MG tablet, Take 1 tablet (100 mg total) by mouth daily., Disp: 90 tablet, Rfl: 3 .  irbesartan (AVAPRO) 150 MG tablet, Take 1 tablet (150 mg total) by mouth daily., Disp: 90  tablet, Rfl: 3 .  pantoprazole (PROTONIX) 40 MG tablet, Take 1 tablet (40 mg total) by mouth daily., Disp: 30 tablet, Rfl: 3  Patient Active Problem List   Diagnosis Date Noted  . Chronic arthropathy 10/25/2019  . Onychomycosis of multiple toenails with type 2 diabetes mellitus ([place]) 05/16/2018  . Erectile dysfunction 08/19/2015  . Tobacco abuse 04/09/2015  . Hyperlipidemia 04/02/2015  . Essential hypertension 04/02/2015  . Panlobular emphysema ([place]) 04/02/2015    Past Surgical History:  Procedure Laterality Date  . FOOT SURGERY      Family History  Problem Relation Age of Onset  . Diabetes Mother     Social History   Tobacco Use  . Smoking status: Current Every Day Smoker    Packs/day: 2.00    Years: 64.00    Pack years: 128.00  Types: Cigarettes  . Smokeless tobacco: Never Used  Vaping Use  . Vaping Use: Never used  Substance Use Topics  . Alcohol use: No    Alcohol/week: 0.0 standard drinks  . Drug use: No     No Known Allergies  Health Maintenance  Topic Date Due  . COVID-19 Vaccine (1) Never done  . OPHTHALMOLOGY EXAM  01/05/2020  . INFLUENZA VACCINE  Never done  . HEMOGLOBIN A1C  08/27/2020  . FOOT EXAM  10/22/2020  . PNA vac Low Risk Adult (2 of 2 - PPSV23) 03/04/2021  . TETANUS/TDAP  07/26/2025    Chart Review Today: I personally reviewed active problem list, medication list, allergies, family history, social history, health maintenance, notes from last encounter, lab results, imaging with the patient/caregiver today.   Review of Systems  10 Systems reviewed and are negative for acute change except as noted in the HPI.    Objective:   Vitals:   04/21/20 1450  BP: 112/74  Pulse: (!) 106  Resp: 16  Temp: 98.6 F (37 C)  SpO2: 98%  Weight: 131 lb 6.4 oz (59.6 kg)  Height: _0  (1.727 m)    Body mass index is 19.98 kg/m.  Physical Exam Vitals and nursing note reviewed.  Constitutional:      General: He is not in acute  distress.    Appearance: Normal appearance. He is well-developed. He is not ill-appearing, toxic-appearing or diaphoretic.     Interventions: Face mask in place.     Comments: Thin appearing elderly male, non-toxic appearing, NAD, smells like cigarette smoke  HENT:     Head: Normocephalic and atraumatic.     Jaw: No trismus.     Right Ear: External ear normal.     Left Ear: External ear normal.  Eyes:     General: Lids are normal. No scleral icterus.       Right eye: No discharge.        Left eye: No discharge.     Conjunctiva/sclera: Conjunctivae normal.  Neck:     Trachea: Trachea and phonation normal. No tracheal deviation.  Cardiovascular:     Rate and Rhythm: Regular rhythm. Tachycardia present.  Extrasystoles are present.    Pulses: Normal pulses.          Radial pulses are 2+ on the right side and 2+ on the left side.     Heart sounds: Heart sounds are distant. No murmur heard.  No friction rub. No gallop.      Comments: No LE edema Pulmonary:     Effort: No respiratory distress.     Breath sounds: Normal breath sounds. No stridor. No wheezing, rhonchi or rales.     Comments: Increased AP diameter and prolonged expiration Good inspiratory effort No tachypnea, accessory muscle use or retractions Chest:     Chest wall: Tenderness present.  Abdominal:     General: Bowel sounds are normal. There is no distension.     Palpations: Abdomen is soft.     Tenderness: There is no abdominal tenderness. There is no right CVA tenderness, left CVA tenderness or guarding.  Musculoskeletal:     Right lower leg: No edema.     Left lower leg: No edema.  Skin:    General: Skin is warm and dry.     Capillary Refill: Capillary refill takes less than 2 seconds.     Coloration: Skin is not cyanotic, jaundiced or pale.     Findings: No rash.  Nails: There is no clubbing.  Neurological:     Mental Status: He is alert.     Cranial Nerves: No dysarthria or facial asymmetry.     Motor: No  tremor or abnormal muscle tone.  Psychiatric:        Mood and Affect: Mood normal.        Speech: Speech normal.        Behavior: Behavior normal. Behavior is cooperative.       ECG interpretation   Date: 04/21/20  Rate: 97  Rhythm: sinus rhythm with PVCs  QRS Axis: normal  Intervals: normal  ST/T Wave abnormalities: non-specific, no ST elevation or depression  Conduction Disutrbances: none Generalized low voltage QRS - no change from past ECG compared to 06/19/2017       Assessment & Plan:     ICD-10-CM   1. Unintentional weight loss  Z60.1 COMPLETE METABOLIC PANEL WITH GFR    CBC with Differential/Platelet    TSH    Amb ref to Medical Nutrition Therapy-MNT       continued weight loss, discussed increasing calories/protein, he agreed to nutritional consult, labs to r/o hyperthyroid, electrolyte abn    2. Hemoptysis  R04.2 CBC with Differential/Platelet   bronchitis? he has f/up with pulmonary - recent CT did not show any masses concerning for malignancy but weight loss is concerning  3. Tobacco use  Z72.0    heavy smoker, pt did not wish to discuss smoking cessation, counseling given regarding need to cut back - may be contributing to weightloss/FTT   4. Essential hypertension  I10 irbesartan (AVAPRO) 150 MG tablet    EKG 12-Lead   change in meds with sig weight loss - d/c HCTZ, continue ACEI and norvasc for now - may be able to further decrease meds at his next couple visits.     5. Rib pain on left side  R07.81 DG Ribs Unilateral Left   chest wall contusion - pt had good breath sounds, was ttp on left lateral chest wall, no flale chest no ecchymosis, Xray to check  The patient did appear to be hypoxic while sitting in the room and I was rechecking his pulse with the pulse ox he was able to get up and ambulate around the clinic with walking his heart rate and pulse ox did improve and normalize.  He was tender to his ribs, I did encourage him to continue to take deep  breaths at least once an hour to prevent developing pneumonia, encouraged him to go across the street to get a chest x-ray just to ensure that everything looked fine     6. Loss of appetite  R63.0    decreased appetite, past sore throat and dysphagia for about 1 to 2 months this is the first I have seen the patient since he had reported the symptoms.  Prior notes also record symptoms of feeling full after only a few bites, today he denies any abdominal pain cramping diarrhea nausea vomiting or dysphagia but states that he is simply not hungry.    No meds previously tried - protonix trial  He does have a upcoming GI appointment.  Abdominal exam was benign today.      7. Encounter for medication monitoring  U93.23 COMPLETE METABOLIC PANEL WITH GFR    CBC with Differential/Platelet    TSH    metFORMIN (GLUCOPHAGE) 1000 MG tablet    irbesartan (AVAPRO) 150 MG tablet  8. Tachycardia  R00.0    pt was  tachycardic and while at rest in exam room had high heart rate and low SpO2  His initial VS -he was mildly tachycardic but pulse ox was normal, did repeat his vital signs after EKG was obtained and with ambulation heart rate and pulse ox both returned to normal -  EKG showed normal sinus with PVCs Tachycardia may be secondary to pain R/o anemia, hyperthyroid Pt denied any exertional shortness of breath he denied orthopnea, palpitations, any chest pain or pressure (no pain besides left ribs sore)   9. Type 2 diabetes mellitus with other specified complication, without long-term current use of insulin ([facility])  E11.69 metFORMIN (GLUCOPHAGE) 1000 MG tablet  DM well controlled - with loss of appetite - will decrease metformin dose to see if possible med SE are causing GI sx?  He can continue his other DM meds for now - pt and his daughter here with him do not want to change too many meds at once.   Return in about 1 month (around 05/21/2020) for weight f/up and labs review.   Delsa Grana,  PA-C 04/21/20 5:18 PM

## 2020-04-22 ENCOUNTER — Ambulatory Visit: Payer: Medicare HMO | Admitting: Podiatry

## 2020-04-22 LAB — COMPLETE METABOLIC PANEL WITH GFR
AG Ratio: 1.8 (calc) (ref 1.0–2.5)
ALT: 10 U/L (ref 9–46)
AST: 10 U/L (ref 10–35)
Albumin: 4.5 g/dL (ref 3.6–5.1)
Alkaline phosphatase (APISO): 48 U/L (ref 35–144)
BUN: 12 mg/dL (ref 7–25)
CO2: 25 mmol/L (ref 20–32)
Calcium: 10 mg/dL (ref 8.6–10.3)
Chloride: 94 mmol/L — ABNORMAL LOW (ref 98–110)
Creat: 0.9 mg/dL (ref 0.70–1.11)
GFR, Est African American: 93 mL/min/{1.73_m2} (ref >=60)
GFR, Est Non African American: 80 mL/min/{1.73_m2} (ref >=60)
Globulin: 2.5 g/dL (calc) (ref 1.9–3.7)
Glucose, Bld: 94 mg/dL (ref 65–99)
Potassium: 4.7 mmol/L (ref 3.5–5.3)
Sodium: 136 mmol/L (ref 135–146)
Total Bilirubin: 0.9 mg/dL (ref 0.2–1.2)
Total Protein: 7 g/dL (ref 6.1–8.1)

## 2020-04-22 LAB — CBC WITH DIFFERENTIAL/PLATELET
Absolute Monocytes: 986 cells/uL — ABNORMAL HIGH (ref 200–950)
Basophils Absolute: 101 cells/uL (ref 0–200)
Basophils Relative: 0.9 %
Eosinophils Absolute: 112 cells/uL (ref 15–500)
Eosinophils Relative: 1 %
HCT: 42.9 % (ref 38.5–50.0)
Hemoglobin: 14.6 g/dL (ref 13.2–17.1)
Lymphs Abs: 2901 cells/uL (ref 850–3900)
MCH: 31.1 pg (ref 27.0–33.0)
MCHC: 34 g/dL (ref 32.0–36.0)
MCV: 91.3 fL (ref 80.0–100.0)
MPV: 10.1 fL (ref 7.5–12.5)
Monocytes Relative: 8.8 %
Neutro Abs: 7101 cells/uL (ref 1500–7800)
Neutrophils Relative %: 63.4 %
Platelets: 414 10*3/uL — ABNORMAL HIGH (ref 140–400)
RBC: 4.7 10*6/uL (ref 4.20–5.80)
RDW: 13.3 % (ref 11.0–15.0)
Total Lymphocyte: 25.9 %
WBC: 11.2 10*3/uL — ABNORMAL HIGH (ref 3.8–10.8)

## 2020-04-22 LAB — TSH: TSH: 2.76 mIU/L (ref 0.40–4.50)

## 2020-04-29 ENCOUNTER — Ambulatory Visit: Payer: Medicare HMO | Admitting: Podiatry

## 2020-05-08 ENCOUNTER — Other Ambulatory Visit: Payer: Self-pay

## 2020-05-08 ENCOUNTER — Encounter: Payer: Self-pay | Admitting: Podiatry

## 2020-05-08 ENCOUNTER — Ambulatory Visit: Payer: Medicare HMO | Admitting: Podiatry

## 2020-05-08 DIAGNOSIS — E1142 Type 2 diabetes mellitus with diabetic polyneuropathy: Secondary | ICD-10-CM | POA: Diagnosis not present

## 2020-05-08 DIAGNOSIS — M79676 Pain in unspecified toe(s): Secondary | ICD-10-CM

## 2020-05-08 DIAGNOSIS — M19071 Primary osteoarthritis, right ankle and foot: Secondary | ICD-10-CM | POA: Diagnosis not present

## 2020-05-08 DIAGNOSIS — B351 Tinea unguium: Secondary | ICD-10-CM | POA: Diagnosis not present

## 2020-05-08 NOTE — Progress Notes (Signed)
Subjective:  Patient ID: Connor Aguilar, male    DOB: 07/17/1941,  MRN: [id number]  Chief Complaint  Patient presents with  . Foot Pain    nail trim and [facility]   He still having pain in bilat feet  . Nail Problem    79 y.o. male presents with the above complaint.  Patient presents with a follow-up of right midfoot arthritis after undergoing forklift injury with old Lisfranc injury which was now fixated.  Patient states the injections do help a lot and he would like to continue doing injections every 3 months.  Eventually we will discuss surgical treatment but not now.  Patient also has thickened elongated dystrophic toenails x10 for which he would like to have them debrided down.  He denies any other acute complaints they are painful to touch he is not able to do it himself.  He would like for me to do it.   Review of Systems: Negative except as noted in the HPI. Denies N/V/F/Ch.  Past Medical History:  Diagnosis Date  . Allergy   . Diabetes mellitus without complication ([place])   . Elevated CPK 11/18/2015  . Hyperlipidemia   . Hypertension   . Tobacco abuse     Current Outpatient Medications:  .  acetaminophen (TYLENOL) 325 MG tablet, Take 2 tablets (650 mg total) by mouth every 6 (six) hours as needed for mild pain (or Fever >/= 101)., Disp: , Rfl:  .  amLODipine (NORVASC) 5 MG tablet, TAKE (1) TABLET BY MOUTH EVERY DAY, Disp: 90 tablet, Rfl: 3 .  benzonatate (TESSALON) 100 MG capsule, Take 1 capsule (100 mg total) by mouth 3 (three) times daily as needed for cough., Disp: 30 capsule, Rfl: 0 .  blood glucose meter kit and supplies, Accuchek or whatever is covered by insurance; check sugars once a day only if desired; Dx E11.65, LON 99 months, Disp: 1 each, Rfl: 0 .  glucose blood test strip, Use as instructed to check sugars once a day if desired; LON 99 months, Dx E11.65, Disp: 100 each, Rfl: 3 .  irbesartan (AVAPRO) 150 MG tablet, Take 1 tablet (150 mg total) by mouth daily., Disp: 90  tablet, Rfl: 3 .  metFORMIN (GLUCOPHAGE) 1000 MG tablet, Take 1 tablet (1,000 mg total) by mouth daily with breakfast., Disp: 180 tablet, Rfl: 3 .  pantoprazole (PROTONIX) 40 MG tablet, Take 1 tablet (40 mg total) by mouth daily., Disp: 30 tablet, Rfl: 3 .  rosuvastatin (CRESTOR) 5 MG tablet, TAKE ONE TABLET EVERY DAY AT BEDTIME, Disp: 90 tablet, Rfl: 3 .  sitaGLIPtin (JANUVIA) 100 MG tablet, Take 1 tablet (100 mg total) by mouth daily., Disp: 90 tablet, Rfl: 3  Social History   Tobacco Use  Smoking Status Current Every Day Smoker  . Packs/day: 2.00  . Years: 64.00  . Pack years: 128.00  . Types: Cigarettes  Smokeless Tobacco Never Used    No Known Allergies Objective:  There were no vitals filed for this visit. There is no height or weight on file to calculate BMI. Constitutional Well developed. Well nourished.  Vascular Dorsalis pedis pulses palpable bilaterally. Posterior tibial pulses palpable bilaterally. Capillary refill normal to all digits.  No cyanosis or clubbing noted. Pedal hair growth normal.  Neurologic Normal speech. Oriented to person, place, and time. Epicritic sensation to light touch grossly present bilaterally.  Dermatologic  thickened elongated dystrophic mycotic discolored toenails x10.  Painful to touch. No open wounds. No skin lesions.  Orthopedic:  Right first tarsometatarsal  joint arthritis.  Pain on palpation severely.  Pain along the dorsal midfoot as well.   Radiographs: 3 views of skeletally mature adult foot: Severe arthritic changes noted of the first tarsometatarsal joint as well as the rest of the other tarsometatarsal joint.  Arthritis of the midfoot noted.  No other bony abnormalities identified.  There is exostosis present at the first tarsometatarsal joint. Assessment:   No diagnosis found. Plan:  Patient was evaluated and treated and all questions answered.  Right midfoot arthritis/exostosis at first tarsometatarsal joint slowly  improving -I explained to the patient the etiology of arthritis and given that he has a history of previous Lisfranc injury is likely the etiology and the cause of the arthritic changes.  I believe patient will benefit from a steroid injection to decrease the inflammatory component associated with arthritis as well as swelling.  Patient agrees with the plan would like to proceed with injection. -A thyroid steroid injection was performed at right dorsal first tarsometatarsal joint using 1% plain Lidocaine and 10 mg of Kenalog. This was well tolerated. -If the injection helps resolve the pain I do not mind doing the injections every 6 weeks to 8 weeks.  However if the pain is not improving will consider also different type of bracing to help with the pain.  Onychomycosis with pain  -Nails palliatively debrided as below. -Educated on self-care  Procedure: Nail Debridement Rationale: pain  Type of Debridement: manual, sharp debridement. Instrumentation: Nail nipper, rotary burr. Number of Nails: 10  Procedures and Treatment: Consent by patient was obtained for treatment procedures. The patient understood the discussion of treatment and procedures well. All questions were answered thoroughly reviewed. Debridement of mycotic and hypertrophic toenails, 1 through 5 bilateral and clearing of subungual debris. No ulceration, no infection noted.  Return Visit-Office Procedure: Patient instructed to return to the office for a follow up visit 3 months for continued evaluation and treatment.  Boneta Lucks, DPM    No follow-ups on file.   No follow-ups on file.

## 2020-05-13 ENCOUNTER — Ambulatory Visit: Payer: Medicare HMO | Admitting: Internal Medicine

## 2020-05-13 ENCOUNTER — Encounter: Payer: Self-pay | Admitting: Internal Medicine

## 2020-05-13 ENCOUNTER — Other Ambulatory Visit: Payer: Self-pay

## 2020-05-13 ENCOUNTER — Telehealth: Payer: Self-pay

## 2020-05-13 VITALS — BP 100/58 | HR 63 | Temp 97.7°F | Ht 68.0 in | Wt 131.0 lb

## 2020-05-13 DIAGNOSIS — J449 Chronic obstructive pulmonary disease, unspecified: Secondary | ICD-10-CM

## 2020-05-13 DIAGNOSIS — F17210 Nicotine dependence, cigarettes, uncomplicated: Secondary | ICD-10-CM

## 2020-05-13 DIAGNOSIS — R0602 Shortness of breath: Secondary | ICD-10-CM | POA: Diagnosis not present

## 2020-05-13 DIAGNOSIS — R490 Dysphonia: Secondary | ICD-10-CM

## 2020-05-13 DIAGNOSIS — J029 Acute pharyngitis, unspecified: Secondary | ICD-10-CM | POA: Diagnosis not present

## 2020-05-13 NOTE — Patient Instructions (Signed)
Hoarseness of voice-recommend ENT referral for assessment

## 2020-05-13 NOTE — Progress Notes (Signed)
Name: Connor Aguilar MRN: [id number] DOB: 07/18/1941     CONSULTATION DATE: 05/13/2020  REFERRING MD : Ancil Boozer   STUDIES:     CT chest Independently reviewed by Me 03/20/2020 No masses, no effusions, no pneumonia, no nodules   CHIEF COMPLAINT: SOB/HOARSENESS OF VOICE  HISTORY OF PRESENT ILLNESS: 79 year old pleasant white male seen today for sore throat for the last several months Patient also has hoarseness of voice for the last several months I have recommended that patient see ENT for further assessment Patient states he has significant amount of weight loss as well  At this time patient is not in any respiratory distress Patient has signs symptoms of COPD Patient is a current smoker 2 pack a day for the last 60 years Patient continues to smoke  Patient states he has lost a significant amount of weight Patient also states that he has early satiety  I have explained to him there is no lung masses in his CT chest However he could have underlying malignancy   Patient will need further evaluation Patient stated that he commented on not knowing where his cancer is if he does have 1  Patient refuses to stop smoking Patient refuses to take any type of inhaler therapy at this time  Patient does have some chronic shortness of breath and dyspnea on exertion No exacerbation at this time No evidence of heart failure at this time No evidence or signs of infection at this time No respiratory distress No fevers, chills, nausea, vomiting, diarrhea No evidence of lower extremity edema + hemoptysis    Smoking Assessment and Cessation Counseling   Upon further questioning, Patient smokes 2 ppd  I have advised patient to quit/stop smoking as soon as possible due to high risk for multiple medical problems  Patient  is NOT willing to quit smoking  I have advised patient that we can assist and have options of Nicotine replacement therapy. I also advised patient on behavioral  therapy and can provide oral medication therapy in conjunction with the other therapies  Follow up next Office visit  for assessment of smoking cessation  Smoking cessation counseling advised for 4 minutes      PAST MEDICAL HISTORY :   has a past medical history of Allergy, Diabetes mellitus without complication ([place]), Elevated CPK (11/18/2015), Hyperlipidemia, Hypertension, and Tobacco abuse.  has a past surgical history that includes Foot surgery. Prior to Admission medications   Medication Sig Start Date End Date Taking? Authorizing Provider  acetaminophen (TYLENOL) 325 MG tablet Take 2 tablets (650 mg total) by mouth every 6 (six) hours as needed for mild pain (or Fever >/= 101). 07/30/15   Gouru, Illene Silver, MD  amLODipine (NORVASC) 5 MG tablet TAKE (1) TABLET BY MOUTH EVERY DAY 02/25/20   Delsa Grana, PA-C  benzonatate (TESSALON) 100 MG capsule Take 1 capsule (100 mg total) by mouth 3 (three) times daily as needed for cough. 02/25/20   Delsa Grana, PA-C  blood glucose meter kit and supplies Accuchek or whatever is covered by insurance; check sugars once a day only if desired; Dx E11.65, LON 99 months 12/13/17   Arnetha Courser, MD  glucose blood test strip Use as instructed to check sugars once a day if desired; LON 99 months, Dx E11.65 12/13/17   Arnetha Courser, MD  irbesartan (AVAPRO) 150 MG tablet Take 1 tablet (150 mg total) by mouth daily. 04/21/20   Delsa Grana, PA-C  metFORMIN (GLUCOPHAGE) 1000 MG tablet Take 1 tablet (1,000  mg total) by mouth daily with breakfast. 04/21/20   Delsa Grana, PA-C  pantoprazole (PROTONIX) 40 MG tablet Take 1 tablet (40 mg total) by mouth daily. 04/21/20   Delsa Grana, PA-C  rosuvastatin (CRESTOR) 5 MG tablet TAKE ONE TABLET EVERY DAY AT BEDTIME 12/17/19   Delsa Grana, PA-C  sitaGLIPtin (JANUVIA) 100 MG tablet Take 1 tablet (100 mg total) by mouth daily. 10/23/19   Delsa Grana, PA-C   No Known Allergies  FAMILY HISTORY:  family history includes Diabetes  in his mother. SOCIAL HISTORY:  reports that he has been smoking cigarettes. He has a 128.00 pack-year smoking history. He has never used smokeless tobacco. He reports that he does not drink alcohol and does not use drugs.    Review of Systems:  Gen:  Denies  fever, sweats, chills weigh loss  HEENT: +sore throat +HOARSENSS OF VOICE Cardiac:  No dizziness, chest pain or heaviness, chest tightness,edema, No JVD Resp:   No cough, -sputum production, +shortness of breath,-wheezing, -hemoptysis,  Gi: Denies swallowing difficulty, stomach pain, nausea or vomiting, diarrhea, constipation, bowel incontinence Gu:  Denies bladder incontinence, burning urine Ext:   Denies Joint pain, stiffness or swelling Skin: Denies  skin rash, easy bruising or bleeding or hives Endoc:  Denies polyuria, polydipsia , polyphagia or weight change Psych:   Denies depression, insomnia or hallucinations  Other:  All other systems negative   BP (!) 100/58 (BP Location: Left Arm, Patient Position: Sitting, Cuff Size: Normal)   Pulse 63   Temp 97.7 F (36.5 C) (Temporal)   Ht 5' 8" (1.727 m)   Wt 131 lb (59.4 kg)   SpO2 96%   BMI 19.92 kg/m     Physical Examination:   GENERAL:NAD, no fevers, chills, no weakness no fatigue HEAD: Normocephalic, atraumatic.  EYES: PERLA, EOMI No scleral icterus.  MOUTH: Moist mucosal membrane.  EAR, NOSE, THROAT: Clear without exudates. No external lesions.  NECK: Supple.  PULMONARY: CTA B/L no wheezing, rhonchi, crackles CARDIOVASCULAR: S1 and S2. Regular rate and rhythm. No murmurs GASTROINTESTINAL: Soft, nontender, nondistended. Positive bowel sounds.  MUSCULOSKELETAL: No swelling, clubbing, or edema.  NEUROLOGIC: No gross focal neurological deficits. 5/5 strength all extremities SKIN: No ulceration, lesions, rashes, or cyanosis.  PSYCHIATRIC: Insight, judgment intact. -depression -anxiety ALL OTHER ROS ARE NEGATIVE   MEDICATIONS: I have reviewed all medications and  confirmed regimen as documented       ASSESSMENT AND PLAN SYNOPSIS  79 year old pleasant white male seen today for hoarseness of voice with intermittent hemoptysis with intermittent shortness of breath and dyspnea on exertion.  It is associated with significant weight loss and early satiety  At this point in time I am very concerned about underlying malignancy I have reviewed the CT of the chest with the patient there is no underlying lung masses   I highly recommend ENT referral for hoarseness of voice Will need to evaluate for underlying throat cancer Recommend smoking cessation  Patient also will need colonoscopy for assessment of colon cancer  At this time patient does not not want to know if he has cancer and if he does have cancer he does not want any type of therapy however I have explained to him that work-up needs to be completed   Regarding his COPD, patient refuses to stop smoking.  Patient refuses to have any type of inhaler therapy     COVID-19 EDUCATION: The signs and symptoms of COVID-19 were discussed with the patient and how to seek care for  testing.  The importance of social distancing was discussed today. Hand Washing Techniques and avoid touching face was advised.     MEDICATION ADJUSTMENTS/LABS AND TESTS ORDERED: Recommend colonoscopy Recommend ENT referral for hoarseness of voice Recommend smoking cessation   CURRENT MEDICATIONS REVIEWED AT LENGTH WITH PATIENT TODAY   Patient satisfied with Plan of action and management. All questions answered  Follow up in 1 year  Total time spent 47 minutes   Corrin Parker, M.D.  [facility]  Medical Director [place] Director [facility] Cardio-Pulmonary Department

## 2020-05-14 ENCOUNTER — Telehealth: Payer: Self-pay

## 2020-05-14 ENCOUNTER — Ambulatory Visit: Payer: Self-pay | Admitting: Pharmacist

## 2020-05-14 NOTE — Chronic Care Management (AMB) (Signed)
Chronic Care Management Pharmacy  Name: Connor Aguilar  MRN: [id number] DOB: [birth year]  Chief Complaint/ HPI  Connor Aguilar,  79 y.o. , male presents for their Follow-Up CCM visit with the clinical pharmacist via telephone due to COVID-19 Pandemic.  PCP : Delsa Grana, PA-C  Their chronic conditions include: DM, HLD, HTN, Tobacco abuse  Office Visits: 9/13 wt loss, [name], BP 112/74 P 106 Wt 131 BMI 20.0, L rib pain, fall, refuses Boost, hemoptysis, hypoxic, appetite loss, still on metformin 1028m daily, d/c irbesartan-HCTZ, start irbesartan  Consult Visit: 10/5 SOB, [name], BP 100/58 P 63 Wt 131 BMI 19.9 sore throat x months, current smoker 2 PPD, wt loss, refuses cessation or inhaler  Medications: Outpatient Encounter Medications as of 05/14/2020  Medication Sig  . acetaminophen (TYLENOL) 325 MG tablet Take 2 tablets (650 mg total) by mouth every 6 (six) hours as needed for mild pain (or Fever >/= 101).  .Marland KitchenamLODipine (NORVASC) 5 MG tablet TAKE (1) TABLET BY MOUTH EVERY DAY  . benzonatate (TESSALON) 100 MG capsule Take 1 capsule (100 mg total) by mouth 3 (three) times daily as needed for cough.  . blood glucose meter kit and supplies Accuchek or whatever is covered by insurance; check sugars once a day only if desired; Dx E11.65, LON 99 months  . glucose blood test strip Use as instructed to check sugars once a day if desired; LON 99 months, Dx E11.65  . irbesartan (AVAPRO) 150 MG tablet Take 1 tablet (150 mg total) by mouth daily.  . metFORMIN (GLUCOPHAGE) 1000 MG tablet Take 1 tablet (1,000 mg total) by mouth daily with breakfast.  . pantoprazole (PROTONIX) 40 MG tablet Take 1 tablet (40 mg total) by mouth daily.  . rosuvastatin (CRESTOR) 5 MG tablet TAKE ONE TABLET EVERY DAY AT BEDTIME  . sitaGLIPtin (JANUVIA) 100 MG tablet Take 1 tablet (100 mg total) by mouth daily.   No facility-administered encounter medications on file as of 05/14/2020.     Financial Resource Strain: Low  Risk   . Difficulty of Paying Living Expenses: Not hard at all     Current Diagnosis/Assessment:  Goals Addressed              This Visit's Progress   .  Hypertension - goal BP < 140/90 (pt-stated)        CARE PLAN ENTRY (see longitudinal plan of care for additional care plan information)  Current Barriers:  . Controlled hypertension, complicated by Diabetes, smoking, hyperlipidemia . Current antihypertensive regimen: Avalide . Previous antihypertensives tried: NA . Last practice recorded BP readings:  BP Readings from Last 3 Encounters:  10/23/19 110/64  09/27/18 124/78  05/16/18 138/84    Pharmacist Clinical Goal(s):  .Marland KitchenOver the next 90 days, patient will work with PharmD and providers to continue optimized antihypertensive regimen  Interventions: . Inter-disciplinary care team collaboration (see longitudinal plan of care) . Comprehensive medication review performed; medication list updated in the electronic medical record.   Patient Self Care Activities:  . Patient will focus on medication adherence by reviewing provider after visit summary  . Making medication changes immediately following your provider visit  Initial goal documentation       Diabetes   Recent Relevant Labs: Lab Results  Component Value Date/Time   HGBA1C 5.7 (H) 02/25/2020 03:03 PM   HGBA1C 6.5 (H) 10/23/2019 10:16 AM   MICROALBUR 0.2 12/13/2017 02:45 PM   MICROALBUR neg 11/18/2015 03:45 PM     Patient is currently controlled on  the following medications: metformin 1052m with breakfast  Last diabetic Foot exam:  Lab Results  Component Value Date/Time   HMDIABEYEEXA No Retinopathy 01/05/2019 12:00 AM    Last diabetic Eye exam: No results found for: HMDIABFOOTEX   We discussed:  Could not confirm that he has reduced metformin to once daily Agreed to do that moving forward.  Plan  Continue current medications   Hypertension   BP goal is:  <140/90  Office blood pressures  are  BP Readings from Last 3 Encounters:  05/13/20 (!) 100/58  04/21/20 112/74  03/04/20 (!) 130/60   Patient checks BP at home infrequently Patient home BP readings are ranging: NA  Patient has failed these meds in the past: irbesartan-HCTZ hypotension Patient is currently controlled on the following medications:  . Irbesartan 1580mdaily . Amlodipine 71m51maily  We discussed: Stop irbesartan-HCTZ as directed by PCP Start irbesartan 150m33mich you have picked up from the pharmacy  Plan  Continue current medications   Tobacco Abuse   Tobacco Status:  Social History   Tobacco Use  Smoking Status Current Every Day Smoker  . Packs/day: 2.00  . Years: 64.00  . Pack years: 128.00  . Types: Cigarettes  Smokeless Tobacco Never Used    On a scale of 1-10, reports MOTIVATION to quit is 5 On a scale of 1-10, reports CONFIDENCE in quitting is 5  Previous quit attempts included: nicotine replacement Patient is currently uncontrolled on the following medications:  . 20 cigarettes daily  We discussed:   20 cigarettes [place] Quitline regardless of stage  Plan  Call [email or web address]  Medication Management    We discussed:  Adherence issues  Plan  Continue current medication management strategy  Follow up: 3 month phone visit  Dimarco Minkin Milus HeightarmD, BCGPMandevilleTSConway [facility]-[phone]

## 2020-05-14 NOTE — Telephone Encounter (Signed)
Clarified with pt it is for a covid test not shot.  He states will get tested and once he has a negative he will call us back.

## 2020-05-14 NOTE — Telephone Encounter (Signed)
Copied from [place] [phone]. Topic: General - Other >> May 14, 2020 12:18 PM Alanda Slim E wrote: Reason for CRM: Pt stated he spoke ENT this morning and he was advised that he couldn't have an appt since he had not had the covid vaccine /Pt asked if Kristeen Miss can find him another ENT or if she can call that provider/ please advise

## 2020-05-15 ENCOUNTER — Other Ambulatory Visit: Payer: Self-pay

## 2020-05-15 ENCOUNTER — Ambulatory Visit (INDEPENDENT_AMBULATORY_CARE_PROVIDER_SITE_OTHER): Payer: Medicare HMO | Admitting: Gastroenterology

## 2020-05-15 ENCOUNTER — Encounter: Payer: Self-pay | Admitting: Gastroenterology

## 2020-05-15 VITALS — BP 129/70 | HR 90 | Ht 68.0 in | Wt 131.6 lb

## 2020-05-15 DIAGNOSIS — J020 Streptococcal pharyngitis: Secondary | ICD-10-CM

## 2020-05-15 DIAGNOSIS — R630 Anorexia: Secondary | ICD-10-CM | POA: Diagnosis not present

## 2020-05-15 DIAGNOSIS — R634 Abnormal weight loss: Secondary | ICD-10-CM

## 2020-05-15 DIAGNOSIS — R6881 Early satiety: Secondary | ICD-10-CM

## 2020-05-15 DIAGNOSIS — J029 Acute pharyngitis, unspecified: Secondary | ICD-10-CM | POA: Diagnosis not present

## 2020-05-15 NOTE — Progress Notes (Signed)
Connor Aguilar [address]  [place]  [place], [place] [address]  Main: [phone]  Fax: [phone]   Gastroenterology Consultation  Referring Provider:     Delsa Grana, PA-C Primary Care Physician:  Delsa Grana, PA-C Reason for Consultation:    Weight loss        HPI:   Chief complaint: Weight loss, loss of appetite  Connor Aguilar is a 79 y.o. y/o male referred for consultation & management  by Dr. Delsa Grana, PA-C. Patient with no prior history of EGD or colonoscopy, describes 65-monthhistory of loss of appetite associated with weight loss. Denies any dysphagia, nausea or vomiting, abdominal pain, or altered bowel habits. No blood in stool, no melena. Patient was recently evaluated by pulmonologist, Dr. KMortimer Friesand reported voice changes and they have referred him to ENT. CT chest did not show any malignant lesions.  Patient's only complaint is sore throat for 3 months  Past Medical History:  Diagnosis Date  . Allergy   . [place]   . Elevated CPK 11/18/2015  . Hyperlipidemia   . Hypertension   . Tobacco abuse     Past Surgical History:  Procedure Laterality Date  . FOOT SURGERY      Prior to Admission medications   Medication Sig Start Date End Date Taking? Authorizing Provider  acetaminophen (TYLENOL) 325 MG tablet Take 2 tablets (650 mg total) by mouth every 6 (six) hours as needed for mild pain (or Fever >/= 101). 07/30/15  Yes Gouru, Aruna, MD  amLODipine (NORVASC) 5 MG tablet TAKE (1) TABLET BY MOUTH EVERY DAY 02/25/20  Yes TDelsa Grana PA-C  benzonatate (TESSALON) 100 MG capsule Take 1 capsule (100 mg total) by mouth 3 (three) times daily as needed for cough. 02/25/20  Yes TDelsa Grana PA-C  blood glucose meter kit and supplies Accuchek or whatever is covered by insurance; check sugars once a day only if desired; Dx E11.65, LON 99 months 12/13/17  Yes Lada, MSatira Anis MD  glucose blood test strip Use as instructed  to check sugars once a day if desired; LON 99 months, Dx E11.65 12/13/17  Yes Lada, MSatira Anis MD  irbesartan (AVAPRO) 150 MG tablet Take 1 tablet (150 mg total) by mouth daily. 04/21/20  Yes TDelsa Grana PA-C  metFORMIN (GLUCOPHAGE) 1000 MG tablet Take 1 tablet (1,000 mg total) by mouth daily with breakfast. 04/21/20  Yes TDelsa Grana PA-C  pantoprazole (PROTONIX) 40 MG tablet Take 1 tablet (40 mg total) by mouth daily. 04/21/20  Yes TDelsa Grana PA-C  rosuvastatin (CRESTOR) 5 MG tablet TAKE ONE TABLET EVERY DAY AT BEDTIME 12/17/19  Yes [name], Leisa, PA-C  sitaGLIPtin (JANUVIA) 100 MG tablet Take 1 tablet (100 mg total) by mouth daily. 10/23/19  Yes TDelsa Grana PA-C    Family History  Problem Relation Age of Onset  . Diabetes Mother      Social History   Tobacco Use  . Smoking status: Current Every Day Smoker    Packs/day: 2.00    Years: 64.00    Pack years: 128.00    Types: Cigarettes  . Smokeless tobacco: Never Used  Vaping Use  . Vaping Use: Never used  Substance Use Topics  . Alcohol use: No    Alcohol/week: 0.0 standard drinks  . Drug use: No    Allergies as of 05/15/2020  . (No Known Allergies)    Review of Systems:    All systems reviewed and negative except where  noted in HPI.   Physical Exam:  BP 129/70 (BP Location: Left Arm, Patient Position: Sitting, Cuff Size: Normal)   Pulse 90   Ht '5\' 8"'  (1.727 m)   Wt 131 lb 9.6 oz (59.7 kg)   BMI 20.01 kg/m  No LMP for male patient. Psych:  Alert and cooperative. Normal mood and affect. General:   Alert,  Well-developed, well-nourished, pleasant and cooperative in NAD Head:  Normocephalic and atraumatic. Eyes:  Sclera clear, no icterus.   Conjunctiva pink. Ears:  Normal auditory acuity. Nose:  No deformity, discharge, or lesions. Mouth:  No deformity or lesions,oropharynx pink & moist. Neck:  Supple; no masses or thyromegaly. Abdomen:  Normal bowel sounds.  No bruits.  Soft, non-tender and non-distended without  masses, hepatosplenomegaly or hernias noted.  No guarding or rebound tenderness.    Msk:  Symmetrical without gross deformities. Good, equal movement & strength bilaterally. Pulses:  Normal pulses noted. Extremities:  No clubbing or edema.  No cyanosis. Neurologic:  Alert and oriented x3;  grossly normal neurologically. Skin:  Intact without significant lesions or rashes. No jaundice. Lymph Nodes:  No significant cervical adenopathy. Psych:  Alert and cooperative. Normal mood and affect.   Labs: CBC    Component Value Date/Time   WBC 11.2 (H) 04/21/2020 1548   RBC 4.70 04/21/2020 1548   HGB 14.6 04/21/2020 1548   HGB 14.6 08/25/2015 0845   HCT 42.9 04/21/2020 1548   HCT 42.5 08/25/2015 0845   PLT 414 (H) 04/21/2020 1548   PLT 239 08/25/2015 0845   MCV 91.3 04/21/2020 1548   MCV 90 08/25/2015 0845   MCV 90 01/18/2012 1335   MCH 31.1 04/21/2020 1548   MCHC 34.0 04/21/2020 1548   RDW 13.3 04/21/2020 1548   RDW 13.5 08/25/2015 0845   RDW 13.2 01/18/2012 1335   LYMPHSABS 2,901 04/21/2020 1548   MONOABS 0.9 01/03/2017 1054   EOSABS 112 04/21/2020 1548   BASOSABS 101 04/21/2020 1548   CMP     Component Value Date/Time   NA 136 04/21/2020 1548   NA 136 12/04/2015 0803   NA 134 (L) 01/18/2012 1335   K 4.7 04/21/2020 1548   K 3.7 01/18/2012 1335   CL 94 (L) 04/21/2020 1548   CL 98 01/18/2012 1335   CO2 25 04/21/2020 1548   CO2 26 01/18/2012 1335   GLUCOSE 94 04/21/2020 1548   GLUCOSE 265 (H) 01/18/2012 1335   BUN 12 04/21/2020 1548   BUN 15 12/04/2015 0803   BUN 12 01/18/2012 1335   CREATININE 0.90 04/21/2020 1548   CALCIUM 10.0 04/21/2020 1548   CALCIUM 8.9 01/18/2012 1335   PROT 7.0 04/21/2020 1548   PROT 7.3 12/04/2015 0803   PROT 7.2 01/18/2012 1335   ALBUMIN 4.4 01/03/2017 1054   ALBUMIN 4.6 12/04/2015 0803   ALBUMIN 3.9 01/18/2012 1335   AST 10 04/21/2020 1548   AST 14 (L) 01/18/2012 1335   ALT 10 04/21/2020 1548   ALT 28 01/18/2012 1335   ALKPHOS 58  01/03/2017 1054   ALKPHOS 86 01/18/2012 1335   BILITOT 0.9 04/21/2020 1548   BILITOT 0.5 12/04/2015 0803   BILITOT 0.5 01/18/2012 1335   GFRNONAA 80 04/21/2020 1548   GFRAA 93 04/21/2020 1548    Imaging Studies: DG Ribs Unilateral Left  Result Date: 04/22/2020 CLINICAL DATA:  Left rib pain after fall 2 days ago. EXAM: LEFT RIBS - 2 VIEW COMPARISON:  July 11, 2017. FINDINGS: No fracture or other bone lesions are  seen involving the ribs. IMPRESSION: Negative. Electronically Signed   By: Marijo Conception M.D.   On: 04/22/2020 16:41    Assessment and Plan:   Connor Aguilar is a 79 y.o. y/o male has been referred for weight loss, loss of appetite  Patient has had a 50-monthhistory of early satiety, loss of appetite and associated weight loss  At his age, scopic procedures would need to be done with caution  Given 316-monthistory of sore throat, with some associated hemoptysis, and previous recommendation to follow-up with ENT, agree with ENT referral. We will help set up an appointment with them. If their work-up is negative, we can consider endoscopy  However, would recommend upper GI study at this time to rule out any large lesions  If all above work-up negative, patient may need EGD and colonoscopy for further assessment of his symptoms and ongoing weight loss  Dr VaVonda AntiguaSpeech recognition software was used to dictate the above note.

## 2020-05-15 NOTE — Patient Instructions (Addendum)
[facility] will be contacting you to schedule you an appointment.  Please do not eat or drink after midnight the night before.

## 2020-05-15 NOTE — Patient Instructions (Addendum)
Visit Information  Goals Addressed              This Visit's Progress   .  Hypertension - goal BP < 140/90 (pt-stated)        CARE PLAN ENTRY (see longitudinal plan of care for additional care plan information)  Current Barriers:  . Controlled hypertension, complicated by Diabetes, smoking, hyperlipidemia . Current antihypertensive regimen: Avalide . Previous antihypertensives tried: NA . Last practice recorded BP readings:  BP Readings from Last 3 Encounters:  10/23/19 110/64  09/27/18 124/78  05/16/18 138/84    Pharmacist Clinical Goal(s):  Marland Kitchen Over the next 90 days, patient will work with PharmD and providers to continue optimized antihypertensive regimen  Interventions: . Inter-disciplinary care team collaboration (see longitudinal plan of care) . Comprehensive medication review performed; medication list updated in the electronic medical record.   Patient Self Care Activities:  . Patient will focus on medication adherence by reviewing provider after visit summary  . Making medication changes immediately following your provider visit  Initial goal documentation        Print copy of patient instructions provided.   Telephone follow up appointment with pharmacy team member scheduled for: 3 months  Milus Height, PharmD, [place], [facility] [phone]  Steps to Quit Smoking Smoking tobacco is the leading cause of preventable death. It can affect almost every organ in the body. Smoking puts you and people around you at risk for many serious, long-lasting (chronic) diseases. Quitting smoking can be hard, but it is one of the best things that you can do for your health. It is never too late to quit. How do I get ready to quit? When you decide to quit smoking, make a plan to help you succeed. Before you quit:  Pick a date to quit. Set a date within the next 2 weeks to give you time to prepare.  Write down the reasons why you are  quitting. Keep this list in places where you will see it often.  Tell your family, friends, and co-workers that you are quitting. Their support is important.  Talk with your doctor about the choices that may help you quit.  Find out if your health insurance will pay for these treatments.  Know the people, places, things, and activities that make you want to smoke (triggers). Avoid them. What first steps can I take to quit smoking?  Throw away all cigarettes at home, at work, and in your car.  Throw away the things that you use when you smoke, such as ashtrays and lighters.  Clean your car. Make sure to empty the ashtray.  Clean your home, including curtains and carpets. What can I do to help me quit smoking? Talk with your doctor about taking medicines and seeing a counselor at the same time. You are more likely to succeed when you do both.  If you are pregnant or breastfeeding, talk with your doctor about counseling or other ways to quit smoking. Do not take medicine to help you quit smoking unless your doctor tells you to do so. To quit smoking: Quit right away  Quit smoking totally, instead of slowly cutting back on how much you smoke over a period of time.  Go to counseling. You are more likely to quit if you go to counseling sessions regularly. Take medicine You may take medicines to help you quit. Some medicines need a prescription, and some you can buy over-the-counter. Some medicines may contain a drug  called nicotine to replace the nicotine in cigarettes. Medicines may:  Help you to stop having the desire to smoke (cravings).  Help to stop the problems that come when you stop smoking (withdrawal symptoms). Your doctor may ask you to use:  Nicotine patches, gum, or lozenges.  Nicotine inhalers or sprays.  Non-nicotine medicine that is taken by mouth. Find resources Find resources and other ways to help you quit smoking and remain smoke-free after you quit. These  resources are most helpful when you use them often. They include:  Online chats with a Social worker.  Phone quitlines.  Printed Furniture conservator/restorer.  Support groups or group counseling.  Text messaging programs.  Mobile phone apps. Use apps on your mobile phone or tablet that can help you stick to your quit plan. There are many free apps for mobile phones and tablets as well as websites. Examples include Quit Guide from the [employer] and smokefree.gov  What things can I do to make it easier to quit?   Talk to your family and friends. Ask them to support and encourage you.  Call a phone quitline (1-800-QUIT-NOW), reach out to support groups, or work with a Social worker.  Ask people who smoke to not smoke around you.  Avoid places that make you want to smoke, such as: ? Bars. ? Parties. ? Smoke-break areas at work.  Spend time with people who do not smoke.  Lower the stress in your life. Stress can make you want to smoke. Try these things to help your stress: ? Getting regular exercise. ? Doing deep-breathing exercises. ? Doing yoga. ? Meditating. ? Doing a body scan. To do this, close your eyes, focus on one area of your body at a time from head to toe. Notice which parts of your body are tense. Try to relax the muscles in those areas. How will I feel when I quit smoking? Day 1 to 3 weeks Within the first 24 hours, you may start to have some problems that come from quitting tobacco. These problems are very bad 2-3 days after you quit, but they do not often last for more than 2-3 weeks. You may get these symptoms:  Mood swings.  Feeling restless, nervous, angry, or annoyed.  Trouble concentrating.  Dizziness.  Strong desire for high-sugar foods and nicotine.  Weight gain.  Trouble pooping (constipation).  Feeling like you may vomit (nausea).  Coughing or a sore throat.  Changes in how the medicines that you take for other issues work in your body.  Depression.  Trouble  sleeping (insomnia). Week 3 and afterward After the first 2-3 weeks of quitting, you may start to notice more positive results, such as:  Better sense of smell and taste.  Less coughing and sore throat.  Slower heart rate.  Lower blood pressure.  Clearer skin.  Better breathing.  Fewer sick days. Quitting smoking can be hard. Do not give up if you fail the first time. Some people need to try a few times before they succeed. Do your best to stick to your quit plan, and talk with your doctor if you have any questions or concerns. Summary  Smoking tobacco is the leading cause of preventable death. Quitting smoking can be hard, but it is one of the best things that you can do for your health.  When you decide to quit smoking, make a plan to help you succeed.  Quit smoking right away, not slowly over a period of time.  When you start quitting, seek  help from your doctor, family, or friends. This information is not intended to replace advice given to you by your health care provider. Make sure you discuss any questions you have with your health care provider. Document Revised: 04/20/2019 Document Reviewed: 10/14/2018 Elsevier Patient Education  [place].

## 2020-05-16 ENCOUNTER — Telehealth: Payer: Self-pay

## 2020-05-16 NOTE — Progress Notes (Signed)
Will do multiple touch points with Connor Aguilar over the next few weeks to ensure compliance per Titus Mould D. Called to find out best times for touch points. Patient request phone calls at 9 Mon, [place], Fri. For now to discuss correct dosing and work on smoking cessation.

## 2020-05-19 ENCOUNTER — Telehealth: Payer: Self-pay | Admitting: Pharmacist

## 2020-05-19 NOTE — Progress Notes (Signed)
Spoke with patient regarding start of irbesartan 150mg  w/o HCTC. Patient stated he was not out out current medication. Reiterated that this was a medication change and strongly encouraged patient to pick new prescription up despite not being out of current prescription. Pt is taking metformin 1000mg  at breakfast correctly. Pt is working on decreasing smoking by one cigarette weekly. He has a hard time seeing the benefit when "he has smoked all his life". Encouragement given to replace habit with something else that engages hands.

## 2020-05-20 ENCOUNTER — Telehealth: Payer: Self-pay

## 2020-05-20 NOTE — Progress Notes (Deleted)
   Name: Josian Lanese   MRN: [id number]    DOB: 08/01/1941   Date:05/20/2020       Progress Note  No chief complaint on file.    Subjective:   Jullien Granquist is a 79 y.o. male, presents to clinic for   ***   Current Outpatient Medications:  .  acetaminophen (TYLENOL) 325 MG tablet, Take 2 tablets (650 mg total) by mouth every 6 (six) hours as needed for mild pain (or Fever >/= 101)., Disp: , Rfl:  .  amLODipine (NORVASC) 5 MG tablet, TAKE (1) TABLET BY MOUTH EVERY DAY, Disp: 90 tablet, Rfl: 3 .  benzonatate (TESSALON) 100 MG capsule, Take 1 capsule (100 mg total) by mouth 3 (three) times daily as needed for cough., Disp: 30 capsule, Rfl: 0 .  blood glucose meter kit and supplies, Accuchek or whatever is covered by insurance; check sugars once a day only if desired; Dx E11.65, LON 99 months, Disp: 1 each, Rfl: 0 .  glucose blood test strip, Use as instructed to check sugars once a day if desired; LON 99 months, Dx E11.65, Disp: 100 each, Rfl: 3 .  irbesartan (AVAPRO) 150 MG tablet, Take 1 tablet (150 mg total) by mouth daily., Disp: 90 tablet, Rfl: 3 .  metFORMIN (GLUCOPHAGE) 1000 MG tablet, Take 1 tablet (1,000 mg total) by mouth daily with breakfast., Disp: 180 tablet, Rfl: 3 .  pantoprazole (PROTONIX) 40 MG tablet, Take 1 tablet (40 mg total) by mouth daily., Disp: 30 tablet, Rfl: 3 .  rosuvastatin (CRESTOR) 5 MG tablet, TAKE ONE TABLET EVERY DAY AT BEDTIME, Disp: 90 tablet, Rfl: 3 .  sitaGLIPtin (JANUVIA) 100 MG tablet, Take 1 tablet (100 mg total) by mouth daily., Disp: 90 tablet, Rfl: 3  Patient Active Problem List   Diagnosis Date Noted  . Chronic arthropathy 10/25/2019  . Onychomycosis of multiple toenails with type 2 diabetes mellitus ([place]) 05/16/2018  . Erectile dysfunction 08/19/2015  . Tobacco abuse 04/09/2015  . Hyperlipidemia 04/02/2015  . Essential hypertension 04/02/2015  . Panlobular emphysema ([place]) 04/02/2015    Past Surgical History:  Procedure Laterality  Date  . FOOT SURGERY      Family History  Problem Relation Age of Onset  . Diabetes Mother     Social History   Tobacco Use  . Smoking status: Current Every Day Smoker    Packs/day: 2.00    Years: 64.00    Pack years: 128.00    Types: Cigarettes  . Smokeless tobacco: Never Used  Vaping Use  . Vaping Use: Never used  Substance Use Topics  . Alcohol use: No    Alcohol/week: 0.0 standard drinks  . Drug use: No     No Known Allergies  Health Maintenance  Topic Date Due  . COVID-19 Vaccine (1) Never done  . OPHTHALMOLOGY EXAM  01/05/2020  . INFLUENZA VACCINE  Never done  . HEMOGLOBIN A1C  08/27/2020  . FOOT EXAM  10/22/2020  . PNA vac Low Risk Adult (2 of 2 - PPSV23) 03/04/2021  . TETANUS/TDAP  07/26/2025    Chart Review Today: ***  Review of Systems   Objective:   There were no vitals filed for this visit.  There is no height or weight on file to calculate BMI.  Physical Exam      Assessment & Plan:   ***  No follow-ups on file.   Cathrine Muster, CMA 05/20/20 3:01 PM

## 2020-05-20 NOTE — Telephone Encounter (Signed)
Called ENT to ask if patient had an appt w/ them to follow up with it. However, patient declined an appt again because he did not want to do a COVID-19 test prior. The receptionist stated that this was his second attempt to schedule his appt with patient since Dr. Mortimer Fries had sent the first referral. I then called the patient and he declined going to ENT because he was needing to do a COVID-19 test and he refuses to schedule anything with [place] either because he would need a test as well. This will be shared with Dr. Bonna Gains.

## 2020-05-21 ENCOUNTER — Ambulatory Visit: Payer: Medicare HMO | Attending: Gastroenterology

## 2020-05-22 ENCOUNTER — Ambulatory Visit: Payer: Medicare HMO | Admitting: Family Medicine

## 2020-05-22 ENCOUNTER — Encounter: Payer: Self-pay | Admitting: Family Medicine

## 2020-05-22 ENCOUNTER — Other Ambulatory Visit: Payer: Self-pay

## 2020-05-22 ENCOUNTER — Ambulatory Visit (INDEPENDENT_AMBULATORY_CARE_PROVIDER_SITE_OTHER): Payer: Medicare HMO | Admitting: Family Medicine

## 2020-05-22 DIAGNOSIS — J029 Acute pharyngitis, unspecified: Secondary | ICD-10-CM | POA: Diagnosis not present

## 2020-05-22 DIAGNOSIS — R634 Abnormal weight loss: Secondary | ICD-10-CM

## 2020-05-22 NOTE — Progress Notes (Signed)
 Name: Connor Aguilar   MRN: [id number]    DOB: 02/01/1941   Date:05/22/2020       Progress Note  Subjective:    Chief Complaint  Chief Complaint  Patient presents with  . Sore Throat    coughing up blood on and off    I connected with  Kharson Hollern on 05/22/20 at  2:20 PM EDT by telephone and verified that I am speaking with the correct person using two identifiers.   I discussed the limitations, risks, security and privacy concerns of performing an evaluation and management service by telephone and the availability of in person appointments. Staff also discussed with the patient that there may be a patient responsible charge related to this service.  Patient verbalized understanding and agreed to proceed with encounter. Patient Location: home Provider Location: [facility] Additional Individuals present: none  HPI Pt present with sore throat  Ongoing for several months Prior evaluation by Dr. Sowles including CT scan Patient has been seen by GI and advised to follow-up with ENT He has also been seen by pulmonology and also advised to see GI and ENT Patient continues to endorse scratchy throat, difficulty and pain with swallowing, endorses scratchy throat and states that ENT will not see him unless he has a Covid vaccination.  He has not scheduled his ENT follow-up but he has been referred. He denies any further weight loss, denies fever, chills, sweats, denies any current dysphagia or choking on food He has been taking Protonix but denies any improvement in his decreased appetite symptoms Previously reported coughing up or vomiting up blood today he reports that he still has some blood-tinged sputum occasionally  Continue current smoker  Patient Active Problem List   Diagnosis Date Noted  . Chronic arthropathy 10/25/2019  . Onychomycosis of multiple toenails with type 2 diabetes mellitus ([facility]) 05/16/2018  . Erectile dysfunction 08/19/2015  . Tobacco abuse 04/09/2015  .  Hyperlipidemia 04/02/2015  . Essential hypertension 04/02/2015  . Panlobular emphysema ([facility]) 04/02/2015    Social History   Tobacco Use  . Smoking status: Current Every Day Smoker    Packs/day: 2.00    Years: 64.00    Pack years: 128.00    Types: Cigarettes  . Smokeless tobacco: Never Used  Substance Use Topics  . Alcohol use: No    Alcohol/week: 0.0 standard drinks     Current Outpatient Medications:  .  acetaminophen (TYLENOL) 325 MG tablet, Take 2 tablets (650 mg total) by mouth every 6 (six) hours as needed for mild pain (or Fever >/= 101)., Disp: , Rfl:  .  amLODipine (NORVASC) 5 MG tablet, TAKE (1) TABLET BY MOUTH EVERY DAY, Disp: 90 tablet, Rfl: 3 .  benzonatate (TESSALON) 100 MG capsule, Take 1 capsule (100 mg total) by mouth 3 (three) times daily as needed for cough., Disp: 30 capsule, Rfl: 0 .  irbesartan (AVAPRO) 150 MG tablet, Take 1 tablet (150 mg total) by mouth daily., Disp: 90 tablet, Rfl: 3 .  metFORMIN (GLUCOPHAGE) 1000 MG tablet, Take 1 tablet (1,000 mg total) by mouth daily with breakfast., Disp: 180 tablet, Rfl: 3 .  pantoprazole (PROTONIX) 40 MG tablet, Take 1 tablet (40 mg total) by mouth daily., Disp: 30 tablet, Rfl: 3 .  rosuvastatin (CRESTOR) 5 MG tablet, TAKE ONE TABLET EVERY DAY AT BEDTIME, Disp: 90 tablet, Rfl: 3 .  sitaGLIPtin (JANUVIA) 100 MG tablet, Take 1 tablet (100 mg total) by mouth daily., Disp: 90 tablet, Rfl: 3 .  blood   glucose meter kit and supplies, Accuchek or whatever is covered by insurance; check sugars once a day only if desired; Dx E11.65, LON 99 months (Patient not taking: Reported on 05/22/2020), Disp: 1 each, Rfl: 0 .  glucose blood test strip, Use as instructed to check sugars once a day if desired; LON 99 months, Dx E11.65 (Patient not taking: Reported on 05/22/2020), Disp: 100 each, Rfl: 3  No Known Allergies  Chart Review: I personally reviewed active problem list, medication list, allergies, family history, social history,  health maintenance, notes from last encounter, lab results, imaging with the patient/caregiver today.   Review of Systems 10 Systems reviewed and are negative for acute change except as noted in the HPI.   Objective:    Virtual encounter, vitals limited, only able to obtain the following Today's Vitals   05/22/20 1410  PainSc: 0-No pain   There is no height or weight on file to calculate BMI. Nursing Note and Vital Signs reviewed.  Physical Exam Patient alert, answering questions appropriately, no audible wheeze or stridor no obvious distress Phonation mostly clear PE limited by telephone encounter  No results found for this or any previous visit (from the past 72 hour(s)).  Assessment and Plan:     ICD-10-CM   1. Sore throat  J02.9   Sore throat for months -we will send in Magic mouthwash for him to try I encouraged him to follow-up with ENT I did clarify that he does not need to be vaccinated for Covid but in order to be seen in the ENT office and have evaluation examination and any procedures that they may need to do he will need to have a Covid test prior to office visit to ensure that he does not have Covid and they can examine and do any procedure they need to with him in office without fear of spreading Covid to other patients or staff.  [facility] staff did call back ENT and clarify testing procedure and scheduling with them and got the patient back on the schedule.   I provided 20+ minutes of non-face-to-face time during this encounter.  Delsa Grana, PA-C 05/22/20 2:30 PM

## 2020-05-23 ENCOUNTER — Telehealth: Payer: Self-pay | Admitting: Family Medicine

## 2020-05-23 NOTE — Telephone Encounter (Signed)
I did not see a mouth wash on chart to call in

## 2020-05-23 NOTE — Telephone Encounter (Signed)
Pt called to speak with Bonnita Nasuti about a mouth wash that was suppose to be sent to pharmacy yesterday/ please advise

## 2020-05-26 ENCOUNTER — Telehealth: Payer: Self-pay | Admitting: Pharmacist

## 2020-05-26 MED ORDER — MAGIC MOUTHWASH W/LIDOCAINE: ORAL | 0 refills | Status: DC

## 2020-05-26 NOTE — Telephone Encounter (Signed)
Pt called and is requesting to have a magic mouth was sent in today. Please advise.

## 2020-05-26 NOTE — Telephone Encounter (Signed)
Please send it script for mouth wash. Patient called today

## 2020-05-26 NOTE — Progress Notes (Signed)
.. °  Recent Relevant Labs: Lab Results  Component Value Date/Time   HGBA1C 5.7 (H) 02/25/2020 03:03 PM   HGBA1C 6.5 (H) 10/23/2019 10:16 AM   MICROALBUR 0.2 12/13/2017 02:45 PM   MICROALBUR neg 11/18/2015 03:45 PM    Kidney Function Lab Results  Component Value Date/Time   CREATININE 0.90 04/21/2020 03:48 PM   CREATININE 1.00 02/25/2020 03:03 PM   GFRNONAA 80 04/21/2020 03:48 PM   GFRAA 93 04/21/2020 03:48 PM     Current antihyperglycemic regimen:  o Metformin 1000mg  daily, Januvia 100 mg daily  What recent interventions/DTPs have been made to improve glycemic control:  o Weight loss (unintentional)  Have there been any recent hospitalizations or ED visits since last visit with [facility]? No  Patient denies hypoglycemic symptoms, including None  Patient reports hyperglycemic symptoms, including blurry vision and lightheadness  How often are you checking your blood sugar? never  What are your blood sugars ranging?  o Fasting:  o Before meals:  o After meals:  o Bedtime:   During the week, how often does your blood glucose drop below 70? unknown  Are you checking your feet daily/regularly?   Adherence Review: Is the patient currently on a STATIN medication? Yes Is the patient currently on ACE/ARB medication? Yes Does the patient have >5 day gap between last estimated fill dates? No   Also discussed smoking cessation with patient. Patient has been able to successfully cut back 1 cigarette daily over the past week. He does not feel that he is ready to cut another one back just yet. He agrees to continue working on identifying habits that increase smoking.  Pt inquired about mouth wash that was suppose to be called in after last visit. Message sent to [facility] to inquire in office regarding status of medication after call confirmed medication not at pharmacy. Patient also would like to see ENT as recommended in past. Clare Gandy will let Kristeen Miss know of request.

## 2020-05-26 NOTE — Telephone Encounter (Signed)
1 Part viscous lidocaine 2% 1 Part Maalox 1 Part diphenhydramine 12.5 mg per 5 ml elixir 1 part nystatin  Quantity: 160 ml  Sig: Swish, gargle, and spit 5-10 mLs TID PRN. Shake well before using.  Rx printed and faxed to pharmacy after calling and speaking with [facility] on how they prefer the Rx be sent in.  Meds ordered this encounter  Medications  . magic mouthwash w/lidocaine SOLN    Sig: Swish, gargle and spit 5-10 mLs TID prn for sore mouth or sore throat, shake well before use    Dispense:  160 mL    Refill:  0    1 Part viscous lidocaine 2%, 1 Part Maalox, 1 Part diphenhydramine 12.5 mg per 5 ml elixir, 1 part nystatin 100000/mL      Delsa Grana, PA-C

## 2020-05-30 ENCOUNTER — Telehealth: Payer: Self-pay

## 2020-05-30 ENCOUNTER — Telehealth: Payer: Self-pay | Admitting: Family Medicine

## 2020-05-30 NOTE — Telephone Encounter (Signed)
Error

## 2020-05-30 NOTE — Telephone Encounter (Signed)
Copied from [place] [phone]. Topic: General - Other >> May 30, 2020 12:08 PM Celene Kras wrote: Reason for CRM: Pt called and is requesting to have a referral for the ophthalmologist. Please advise.

## 2020-06-02 NOTE — Telephone Encounter (Signed)
Patient notified that he may make his on appointment

## 2020-06-17 ENCOUNTER — Ambulatory Visit: Payer: Medicare HMO

## 2020-06-17 ENCOUNTER — Ambulatory Visit (INDEPENDENT_AMBULATORY_CARE_PROVIDER_SITE_OTHER): Payer: Medicare HMO

## 2020-06-17 ENCOUNTER — Ambulatory Visit: Payer: Self-pay

## 2020-06-17 ENCOUNTER — Telehealth: Payer: Self-pay

## 2020-06-17 DIAGNOSIS — E782 Mixed hyperlipidemia: Secondary | ICD-10-CM

## 2020-06-17 DIAGNOSIS — Z0000 Encounter for general adult medical examination without abnormal findings: Secondary | ICD-10-CM | POA: Diagnosis not present

## 2020-06-17 DIAGNOSIS — I10 Essential (primary) hypertension: Secondary | ICD-10-CM

## 2020-06-17 NOTE — Chronic Care Management (AMB) (Unsigned)
Chronic Care Management Pharmacy  Name: Connor Aguilar  MRN: [id number] DOB: [birth year]  Chief Complaint/ HPI  Connor Aguilar,  79 y.o. , male presents for their Follow-Up CCM visit with the clinical pharmacist via telephone due to COVID-19 Pandemic.  PCP : Delsa Grana, PA-C  Their chronic conditions include: DM, HLD, HTN, Tobacco abuse  Office Visits: 9/13 wt loss, [name], BP 112/74 P 106 Wt 131 BMI 20.0, L rib pain, fall, refuses Boost, hemoptysis, hypoxic, appetite loss, still on metformin 1059m daily, d/c irbesartan-HCTZ, start irbesartan  Consult Visit: 10/5 SOB, [name], BP 100/58 P 63 Wt 131 BMI 19.9 sore throat x months, current smoker 2 PPD, wt loss, refuses cessation or inhaler  Medications: Outpatient Encounter Medications as of 06/17/2020  Medication Sig  . acetaminophen (TYLENOL) 325 MG tablet Take 2 tablets (650 mg total) by mouth every 6 (six) hours as needed for mild pain (or Fever >/= 101).  .Marland KitchenamLODipine (NORVASC) 5 MG tablet TAKE (1) TABLET BY MOUTH EVERY DAY  . benzonatate (TESSALON) 100 MG capsule Take 1 capsule (100 mg total) by mouth 3 (three) times daily as needed for cough.  . blood glucose meter kit and supplies Accuchek or whatever is covered by insurance; check sugars once a day only if desired; Dx E11.65, LON 99 months (Patient not taking: Reported on 05/22/2020)  . glucose blood test strip Use as instructed to check sugars once a day if desired; LON 99 months, Dx E11.65 (Patient not taking: Reported on 05/22/2020)  . irbesartan (AVAPRO) 150 MG tablet Take 1 tablet (150 mg total) by mouth daily.  . magic mouthwash w/lidocaine SOLN Swish, gargle and spit 5-10 mLs TID prn for sore mouth or sore throat, shake well before use  . metFORMIN (GLUCOPHAGE) 1000 MG tablet Take 1 tablet (1,000 mg total) by mouth daily with breakfast.  . pantoprazole (PROTONIX) 40 MG tablet Take 1 tablet (40 mg total) by mouth daily.  . rosuvastatin (CRESTOR) 5 MG tablet TAKE ONE TABLET  EVERY DAY AT BEDTIME  . sitaGLIPtin (JANUVIA) 100 MG tablet Take 1 tablet (100 mg total) by mouth daily.   No facility-administered encounter medications on file as of 06/17/2020.     Financial Resource Strain:   . Difficulty of Paying Living Expenses: Not on file     Current Diagnosis/Assessment:  Goals Addressed   None    Diabetes   Recent Relevant Labs: Lab Results  Component Value Date/Time   HGBA1C 5.7 (H) 02/25/2020 03:03 PM   HGBA1C 6.5 (H) 10/23/2019 10:16 AM   MICROALBUR 0.2 12/13/2017 02:45 PM   MICROALBUR neg 11/18/2015 03:45 PM     Patient is currently controlled on the following medications:   metformin 10011mwith breakfast  Januvia 100 mg daily?   Last diabetic Foot exam:  Lab Results  Component Value Date/Time   HMDIABEYEEXA No Retinopathy 01/05/2019 12:00 AM    Last diabetic Eye exam: No results found for: HMDIABFOOTEX   We discussed:    Plan  Continue current medications   Hypertension   BP goal is:  <140/90  Office blood pressures are  BP Readings from Last 3 Encounters:  05/15/20 129/70  05/13/20 (!) 100/58  04/21/20 112/74   Patient checks BP at home infrequently Patient home BP readings are ranging: NA  Patient has failed these meds in the past: irbesartan-HCTZ hypotension Patient is currently controlled on the following medications:  . Irbesartan 15037maily . Amlodipine 5mg77mily  We discussed:  Plan  Continue current  medications   Hyperlipidemia   LDL goal < 70  Last lipids Lab Results  Component Value Date   CHOL 94 10/23/2019   HDL 42 10/23/2019   LDLCALC 29 10/23/2019   TRIG 145 10/23/2019   CHOLHDL 2.2 10/23/2019   Hepatic Function Latest Ref Rng & Units 04/21/2020 02/25/2020 10/23/2019  Total Protein 6.1 - 8.1 g/dL 7.0 6.6 6.6  Albumin 3.5 - 5.0 g/dL - - -  AST 10 - 35 U/L '10 12 13  ' ALT 9 - 46 U/L '10 12 14  ' Alk Phosphatase 38 - 126 U/L - - -  Total Bilirubin 0.2 - 1.2 mg/dL 0.9 0.5 0.5     The ASCVD  Risk score ([place]., et al., 2013) failed to calculate for the following reasons:   The 2013 ASCVD risk score is only valid for ages 18 to 36   Patient has failed these meds in past: n/a Patient is currently controlled on the following medications:  . Rosuvastatin 5 mg daily  We discussed:  diet and exercise extensively  Plan  Continue current medications  Tobacco Abuse   Tobacco Status:  Social History   Tobacco Use  Smoking Status Current Every Day Smoker  . Packs/day: 2.00  . Years: 64.00  . Pack years: 128.00  . Types: Cigarettes  Smokeless Tobacco Never Used    On a scale of 1-10, reports MOTIVATION to quit is 5 On a scale of 1-10, reports CONFIDENCE in quitting is 5  Previous quit attempts included: nicotine replacement Patient is currently uncontrolled on the following medications:  . 20 cigarettes daily  We discussed:   20 cigarettes [place] Quitline regardless of stage  Plan  Call [id number]  Medication Management   Pt uses *** pharmacy for all medications Uses pill box? {Yes or If no, why not?:20788} Pt endorses ***% compliance  We discussed: Current pharmacy is preferred with insurance plan and patient is satisfied with pharmacy services  Plan  Continue current medication management strategy  Follow up: 3 month phone visit  [place] [phone]

## 2020-06-17 NOTE — Chronic Care Management (AMB) (Signed)
Chronic Care Management Pharmacy  Name: Connor Aguilar  MRN: [id number] DOB: [birth year]  Chief Complaint/ HPI  Connor Aguilar,  79 y.o. , male presents for their Follow-Up CCM visit with the clinical pharmacist via telephone.  PCP : Delsa Grana, PA-C  Their chronic conditions include: DM, HLD, HTN, Tobacco abuse  Office Visits: 9/13 wt loss, [name], BP 112/74 P 106 Wt 131 BMI 20.0, L rib pain, fall, refuses Boost, hemoptysis, hypoxic, appetite loss, still on metformin 1070m daily, d/c irbesartan-HCTZ, start irbesartan  Consult Visit: 10/5 SOB, [name], BP 100/58 P 63 Wt 131 BMI 19.9 sore throat x months, current smoker 2 PPD, wt loss, refuses cessation or inhaler  Medications: Outpatient Encounter Medications as of 06/17/2020  Medication Sig  . acetaminophen (TYLENOL) 325 MG tablet Take 2 tablets (650 mg total) by mouth every 6 (six) hours as needed for mild pain (or Fever >/= 101).  .Marland KitchenamLODipine (NORVASC) 5 MG tablet TAKE (1) TABLET BY MOUTH EVERY DAY  . blood glucose meter kit and supplies Accuchek or whatever is covered by insurance; check sugars once a day only if desired; Dx E11.65, LON 99 months (Patient not taking: Reported on 05/22/2020)  . glucose blood test strip Use as instructed to check sugars once a day if desired; LON 99 months, Dx E11.65 (Patient not taking: Reported on 05/22/2020)  . irbesartan (AVAPRO) 150 MG tablet Take 1 tablet (150 mg total) by mouth daily.  . metFORMIN (GLUCOPHAGE) 1000 MG tablet Take 1 tablet (1,000 mg total) by mouth daily with breakfast.  . pantoprazole (PROTONIX) 40 MG tablet Take 1 tablet (40 mg total) by mouth daily.  . rosuvastatin (CRESTOR) 5 MG tablet TAKE ONE TABLET EVERY DAY AT BEDTIME  . sitaGLIPtin (JANUVIA) 100 MG tablet Take 1 tablet (100 mg total) by mouth daily.   No facility-administered encounter medications on file as of 06/17/2020.     Financial Resource Strain: Low Risk   . Difficulty of Paying Living Expenses: Not hard at  all   Current Diagnosis/Assessment:  SDOH Interventions     Most Recent Value  SDOH Interventions  Financial Strain Interventions Intervention Not Indicated  Transportation Interventions Intervention Not Indicated     Goals Addressed              This Visit's Progress   .  Diabetes Mellitus Goal A1c < 7% (pt-stated)   On track     [place](see longitudinal plan of care for additional care plan information)  Current Barriers:  . Diabetes: type 2; complicated by chronic medical conditions including COPD, HTN, Lab Results  Component Value Date   HGBA1C 6.5 (H) 10/23/2019 .   Lab Results  Component Value Date   CREATININE 0.85 10/23/2019   CREATININE 0.94 09/27/2018   CREATININE 1.09 05/16/2018 .   .Marland KitchenNo results found for: EGFR . Current antihyperglycemic regimen: metformin . Denies hypoglycemic symptoms, including dizziness, lightheadedness, shaking, sweating . Denies hyperglycemic symptoms, including polyuria, polydipsia, polyphagia, nocturia, blurred vision, neuropathy . Current exercise: Still works as pTeacher, adult education . Current blood glucose readings: does not check . Cardiovascular risk reduction: o Current hypertensive regimen: Avalide o Current hyperlipidemia regimen: Crestor o Current antiplatelet regimen: None  Pharmacist Clinical Goal(s):  .Marland KitchenOver the next 90 days, patient will work with PharmD and primary care provider to address maintaining goal achieved with most recent labwork.  Interventions: . Comprehensive medication review performed, medication list updated in electronic medical record . Inter-disciplinary care team collaboration (see  longitudinal plan of care) . Recommend ASA 81m  Patient Self Care Activities:  . Patient will report any questions or concerns to provider  . Try to reduce the number of daily cigarettes if possible . Take a daily 830maspirin  Initial goal documentation     .  Hyperlipidemia - goal LDL < 70  (pt-stated)   On track     [place] longitudinal plan of care for additional care plan information)  Current Barriers:  . Controlled hyperlipidemia, complicated by Diabetes, COPD . Current antihyperlipidemic regimen: Crestor . Previous antihyperlipidemic medications tried NA . Most recent lipid panel:     Component Value Date/Time   CHOL 94 10/23/2019 1016   CHOL 196 12/04/2015 0803   TRIG 145 10/23/2019 1016   HDL 42 10/23/2019 1016   HDL 47 12/04/2015 0803   CHOLHDL 2.2 10/23/2019 1016   LDLCALC 29 10/23/2019 1016 .   . Marland KitchenSCVD risk enhancing conditions: age >6>67DM, HTN, CKD, CHF, current smoker  Pharmacist Clinical Goal(s):  . Marland Kitchenver the next 90 days, patient will continue to work with PharmD and providers towards continued optimized antihyperlipidemic therapy . Continue Crestor despite very low LDL due to diabetes and continued smoking  Interventions: . Comprehensive medication review performed; medication list updated in electronic medical record.  . [place] team collaboration (see longitudinal plan of care)  Patient Self Care Activities:  . Patient will focus on medication adherence by continuing current pattern  Initial goal documentation     .  Hypertension - goal BP < 140/90 (pt-stated)   On track     [place] plan of care for additional care plan information)  Current Barriers:  . Controlled hypertension, complicated by Diabetes, smoking, hyperlipidemia . Current antihypertensive regimen: Avalide . Previous antihypertensives tried: NA . Last practice recorded BP readings:  BP Readings from Last 3 Encounters:  10/23/19 110/64  09/27/18 124/78  05/16/18 138/84    Pharmacist Clinical Goal(s):  . Marland Kitchenver the next 90 days, patient will work with PharmD and providers to continue optimized antihypertensive regimen  Interventions: . Inter-disciplinary care team collaboration (see longitudinal plan of  care) . Comprehensive medication review performed; medication list updated in the electronic medical record.   Patient Self Care Activities:  . Patient will focus on medication adherence by reviewing provider after visit summary  . Making medication changes immediately following your provider visit  Initial goal documentation       Diabetes   Recent Relevant Labs: Lab Results  Component Value Date/Time   HGBA1C 5.7 (H) 02/25/2020 03:03 PM   HGBA1C 6.5 (H) 10/23/2019 10:16 AM   MICROALBUR 0.2 12/13/2017 02:45 PM   MICROALBUR neg 11/18/2015 03:45 PM     Patient is currently controlled on the following medications:   Metformin 100025mith breakfast (takes twice daily)   Januvia 100 mg daily   Last diabetic Foot exam:  Lab Results  Component Value Date/Time   HMDIABEYEEXA No Retinopathy 01/05/2019 12:00 AM    Last diabetic Eye exam: No results found for: HMDIABFOOTEX   We discussed: Patient has resumed taking metformin twice daily. Denies hypoglycemia or GI symptoms with increased dose. States JanCelesta Gentile sometimes expensive but denies challenges with affording it at this time.  Could consider Metformin twice daily and discontinuing Januvia, but will not make any changes a this time.   Plan  Continue current medications   Hypertension   BP goal is:  <140/90  Office blood pressures  are  BP Readings from Last 3 Encounters:  05/15/20 129/70  05/13/20 (!) 100/58  04/21/20 112/74   Lab Results  Component Value Date   CREATININE 0.90 04/21/2020   BUN 12 04/21/2020   GFRNONAA 80 04/21/2020   GFRAA 93 04/21/2020   NA 136 04/21/2020   K 4.7 04/21/2020   CALCIUM 10.0 04/21/2020   CO2 25 04/21/2020   Patient checks BP at home infrequently Patient home BP readings are ranging: NA  Patient has failed these meds in the past: irbesartan-HCTZ hypotension Patient is currently controlled on the following medications:  . Irbesartan 152m daily . Amlodipine 540mdaily  We  discussed: Encourage patient to increase frequency of BP monitoring.   Plan  Continue current medications   Hyperlipidemia   LDL goal < 70  Last lipids Lab Results  Component Value Date   CHOL 94 10/23/2019   HDL 42 10/23/2019   LDLCALC 29 10/23/2019   TRIG 145 10/23/2019   CHOLHDL 2.2 10/23/2019   Hepatic Function Latest Ref Rng & Units 04/21/2020 02/25/2020 10/23/2019  Total Protein 6.1 - 8.1 g/dL 7.0 6.6 6.6  Albumin 3.5 - 5.0 g/dL - - -  AST 10 - 35 U/L _0 ALT 9 - 46 U/L _1 Alk Phosphatase 38 - 126 U/L - - -  Total Bilirubin 0.2 - 1.2 mg/dL 0.9 0.5 0.5     The ASCVD Risk score ([place] et al., 2013) failed to calculate for the following reasons:   The 2013 ASCVD risk score is only valid for ages 4054o 7927 Patient has failed these meds in past: n/a Patient is currently controlled on the following medications:  . Rosuvastatin 5 mg daily  We discussed:  diet and exercise extensively  Plan  Continue current medications  Tobacco Abuse   Tobacco Status:  Social History   Tobacco Use  Smoking Status Current Every Day Smoker  . Packs/day: 1.50  . Years: 64.00  . Pack years: 96.00  . Types: Cigarettes  Smokeless Tobacco Never Used    On a scale of 1-10, reports MOTIVATION to quit is 5 On a scale of 1-10, reports CONFIDENCE in quitting is 5  Previous quit attempts included: nicotine replacement Patient is currently uncontrolled on the following medications:  . 20 cigarettes daily  We discussed:   20 cigarettes [place] Quitline regardless of stage  Plan  Call [email or web address]  Medication Management   Pt uses [name] pharmacy for all medications Uses pill box? Yes  We discussed: Current pharmacy is preferred with insurance plan and patient is satisfied with pharmacy services  Plan  Continue current medication management strategy  Follow up: 3 month phone visit  [phone]

## 2020-06-17 NOTE — Progress Notes (Signed)
Subjective:   Connor Aguilar is a 79 y.o. male who presents for Medicare Annual/Subsequent preventive examination.  Virtual Visit via Telephone Note  I connected with  Connor Aguilar on 06/17/20 at  2:10 PM EST by telephone and verified that I am speaking with the correct person using two identifiers.  Medicare Annual Wellness visit completed telephonically due to Covid-19 pandemic.   Location: Patient: home Provider: Secretary   I discussed the limitations, risks, security and privacy concerns of performing an evaluation and management service by telephone and the availability of in person appointments. The patient expressed understanding and agreed to proceed.  Unable to perform video visit due to video visit attempted and failed and/or patient does not have video capability.   Some vital signs may be absent or patient reported.   Clemetine Marker, LPN    Review of Systems     Cardiac Risk Factors include: advanced age (>34mn, >>59women);diabetes mellitus;dyslipidemia;male gender;smoking/ tobacco exposure;hypertension     Objective:    There were no vitals filed for this visit. There is no height or weight on file to calculate BMI.  Advanced Directives 06/17/2020 06/15/2019 06/18/2017 05/19/2017 02/01/2017 07/07/2016 06/24/2016  Does Patient Have a Medical Advance Directive? No No No No No Yes Yes  Type of Advance Directive - - - - - Living will Living will  Does patient want to make changes to medical advance directive? - - - - - - -  [place]? - - - - - - -  Would patient like information on creating a medical advance directive? No - Patient declined No - Patient declined No - Patient declined - - - -    Current Medications (verified) Outpatient Encounter Medications as of 06/17/2020  Medication Sig  . acetaminophen (TYLENOL) 325 MG tablet Take 2 tablets (650 mg total) by mouth every 6 (six) hours as needed for mild pain (or Fever >/= 101).    .Marland KitchenamLODipine (NORVASC) 5 MG tablet TAKE (1) TABLET BY MOUTH EVERY DAY  . irbesartan (AVAPRO) 150 MG tablet Take 1 tablet (150 mg total) by mouth daily.  . metFORMIN (GLUCOPHAGE) 1000 MG tablet Take 1 tablet (1,000 mg total) by mouth daily with breakfast.  . pantoprazole (PROTONIX) 40 MG tablet Take 1 tablet (40 mg total) by mouth daily.  . rosuvastatin (CRESTOR) 5 MG tablet TAKE ONE TABLET EVERY DAY AT BEDTIME  . sitaGLIPtin (JANUVIA) 100 MG tablet Take 1 tablet (100 mg total) by mouth daily.  . blood glucose meter kit and supplies Accuchek or whatever is covered by insurance; check sugars once a day only if desired; Dx E11.65, LON 99 months (Patient not taking: Reported on 05/22/2020)  . glucose blood test strip Use as instructed to check sugars once a day if desired; LON 99 months, Dx E11.65 (Patient not taking: Reported on 05/22/2020)  . [DISCONTINUED] benzonatate (TESSALON) 100 MG capsule Take 1 capsule (100 mg total) by mouth 3 (three) times daily as needed for cough.  . [DISCONTINUED] magic mouthwash w/lidocaine SOLN Swish, gargle and spit 5-10 mLs TID prn for sore mouth or sore throat, shake well before use   No facility-administered encounter medications on file as of 06/17/2020.    Allergies (verified) Patient has no known allergies.   History: Past Medical History:  Diagnosis Date  . Allergy   . Diabetes mellitus without complication ([facility]   . Elevated CPK 11/18/2015  . Hyperlipidemia   . Hypertension   . Tobacco  abuse    Past Surgical History:  Procedure Laterality Date  . FOOT SURGERY     Family History  Problem Relation Age of Onset  . Diabetes Mother    Social History   Socioeconomic History  . Marital status: Widowed    Spouse name: Not on file  . Number of children: 2  . Years of education: Not on file  . Highest education level: Not on file  Occupational History  . Not on file  Tobacco Use  . Smoking status: Current Every Day Smoker    Packs/day: 1.50     Years: 64.00    Pack years: 96.00    Types: Cigarettes  . Smokeless tobacco: Never Used  Vaping Use  . Vaping Use: Never used  Substance and Sexual Activity  . Alcohol use: No    Alcohol/week: 0.0 standard drinks  . Drug use: No  . Sexual activity: Never  Other Topics Concern  . Not on file  Social History Narrative   Pt wife passed away 10/30/14. Lives between his two sons.    Social Determinants of Health   Financial Resource Strain: Low Risk   . Difficulty of Paying Living Expenses: Not hard at all  Food Insecurity: No Food Insecurity  . Worried About Charity fundraiser in the Last Year: Never true  . Ran Out of Food in the Last Year: Never true  Transportation Needs: No Transportation Needs  . Lack of Transportation (Medical): No  . Lack of Transportation (Non-Medical): No  Physical Activity: Inactive  . Days of Exercise per Week: 0 days  . Minutes of Exercise per Session: 0 min  Stress: No Stress Concern Present  . Feeling of Stress : Not at all  Social Connections: Unknown  . Frequency of Communication with Friends and Family: Patient refused  . Frequency of Social Gatherings with Friends and Family: Patient refused  . Attends Religious Services: Patient refused  . Active Member of Clubs or Organizations: Patient refused  . Attends Archivist Meetings: Patient refused  . Marital Status: Widowed    Tobacco Counseling Ready to quit: No Counseling given: Not Answered   Clinical Intake:  Pre-visit preparation completed: Yes  Pain : No/denies pain     Diabetes: Yes CBG done?: No Did pt. bring in CBG monitor from home?: No  How often do you need to have someone help you when you read instructions, pamphlets, or other written materials from your doctor or pharmacy?: 1 - Never  Nutrition Risk Assessment:  Has the patient had any N/V/D within the last 2 months?  No  Does the patient have any non-healing wounds?  No  Has the patient had any  unintentional weight loss or weight gain?  No   Diabetes:  Is the patient diabetic?  Yes  If diabetic, was a CBG obtained today?  No  Did the patient bring in their glucometer from home?  No  How often do you monitor your CBG's? Pt does not actively check blood sugar.   Financial Strains and Diabetes Management:  Are you having any financial strains with the device, your supplies or your medication? No .  Does the patient want to be seen by Chronic Care Management for management of their diabetes?  No  Would the patient like to be referred to a Nutritionist or for Diabetic Management?  No   Diabetic Exams:  Diabetic Eye Exam: Completed 01/05/19 negative retinopathy.   Diabetic Foot Exam: Completed 10/23/19.  Interpreter Needed?: No  Information entered by :: Clemetine Marker LPN   Activities of Daily Living In your present state of health, do you have any difficulty performing the following activities: 06/17/2020 05/22/2020  Hearing? N N  Comment declines hearing aids -  Vision? N N  Difficulty concentrating or making decisions? N N  Walking or climbing stairs? N N  Dressing or bathing? N N  Doing errands, shopping? N N  Preparing Food and eating ? N -  Using the Toilet? N -  In the past six months, have you accidently leaked urine? N -  Do you have problems with loss of bowel control? N -  Managing your Medications? N -  Managing your Finances? N -  Housekeeping or managing your Housekeeping? N -  Some recent data might be hidden    Patient Care Team: Delsa Grana, PA-C as PCP - General (Family Medicine) Germaine Pomfret, [facility] as Pharmacist (Pharmacist)  Indicate any recent Medical Services you may have received from other than [facility] providers in the past year (date may be approximate).     Assessment:   This is a routine wellness examination for Valle.  Hearing/Vision screen  Hearing Screening   '125Hz'  '250Hz'  '500Hz'  '1000Hz'  '2000Hz'  '3000Hz'  '4000Hz'  '6000Hz'  '8000Hz'     Right ear:           Left ear:           Comments: Pt denies hearing difficulty  Vision Screening Comments: [facility]  Dietary issues and exercise activities discussed: Current Exercise Habits: The patient does not participate in regular exercise at present, Exercise limited by: None identified  Goals    .  Diabetes Mellitus Goal A1c < 7% (pt-stated)      CARE PLAN ENTRY (see longitudinal plan of care for additional care plan information)  Current Barriers:  . Diabetes: type 2; complicated by chronic medical conditions including COPD, HTN, Lab Results  Component Value Date   HGBA1C 6.5 (H) 10/23/2019 .   Lab Results  Component Value Date   CREATININE 0.85 10/23/2019   CREATININE 0.94 09/27/2018   CREATININE 1.09 05/16/2018 .   Marland Kitchen No results found for: EGFR . Current antihyperglycemic regimen: metformin . Denies hypoglycemic symptoms, including dizziness, lightheadedness, shaking, sweating . Denies hyperglycemic symptoms, including polyuria, polydipsia, polyphagia, nocturia, blurred vision, neuropathy . Current exercise: Still works as Teacher, adult education. . Current blood glucose readings: does not check . Cardiovascular risk reduction: o Current hypertensive regimen: Avalide o Current hyperlipidemia regimen: Crestor o Current antiplatelet regimen: None  Pharmacist Clinical Goal(s):  Marland Kitchen Over the next 90 days, patient will work with PharmD and primary care provider to address maintaining goal achieved with most recent labwork.  Interventions: . Comprehensive medication review performed, medication list updated in electronic medical record . Inter-disciplinary care team collaboration (see longitudinal plan of care) . Recommend ASA 46m  Patient Self Care Activities:  . Patient will report any questions or concerns to provider  . Try to reduce the number of daily cigarettes if possible . Take a daily 873maspirin  Initial goal  documentation     .  Hyperlipidemia - goal LDL < 70 (pt-stated)      CARE PLAN ENTRY (see longitudinal plan of care for additional care plan information)  Current Barriers:  . Controlled hyperlipidemia, complicated by Diabetes, COPD . Current antihyperlipidemic regimen: Crestor . Previous antihyperlipidemic medications tried NA . Most recent lipid panel:     Component  Value Date/Time   CHOL 94 10/23/2019 1016   CHOL 196 12/04/2015 0803   TRIG 145 10/23/2019 1016   HDL 42 10/23/2019 1016   HDL 47 12/04/2015 0803   CHOLHDL 2.2 10/23/2019 1016   LDLCALC 29 10/23/2019 1016 .   Marland Kitchen ASCVD risk enhancing conditions: age >7, DM, HTN, CKD, CHF, current smoker  Pharmacist Clinical Goal(s):  Marland Kitchen Over the next 90 days, patient will continue to work with PharmD and providers towards continued optimized antihyperlipidemic therapy . Continue Crestor despite very low LDL due to diabetes and continued smoking  Interventions: . Comprehensive medication review performed; medication list updated in electronic medical record.  Bertram Savin care team collaboration (see longitudinal plan of care)  Patient Self Care Activities:  . Patient will focus on medication adherence by continuing current pattern  Initial goal documentation     .  Hypertension - goal BP < 140/90 (pt-stated)      CARE PLAN ENTRY (see longitudinal plan of care for additional care plan information)  Current Barriers:  . Controlled hypertension, complicated by Diabetes, smoking, hyperlipidemia . Current antihypertensive regimen: Avalide . Previous antihypertensives tried: NA . Last practice recorded BP readings:  BP Readings from Last 3 Encounters:  10/23/19 110/64  09/27/18 124/78  05/16/18 138/84    Pharmacist Clinical Goal(s):  Marland Kitchen Over the next 90 days, patient will work with PharmD and providers to continue optimized antihypertensive regimen  Interventions: . Inter-disciplinary care team collaboration (see  longitudinal plan of care) . Comprehensive medication review performed; medication list updated in the electronic medical record.   Patient Self Care Activities:  . Patient will focus on medication adherence by reviewing provider after visit summary  . Making medication changes immediately following your provider visit  Initial goal documentation       Depression Screen PHQ 2/9 Scores 06/17/2020 05/22/2020 04/21/2020 03/04/2020 02/25/2020 10/23/2019 06/15/2019  PHQ - 2 Score 0 0 0 0 0 0 2  PHQ- 9 Score - - 0 0 0 0 2    Fall Risk Fall Risk  06/17/2020 05/22/2020 04/21/2020 03/04/2020 02/25/2020  Falls in the past year? 1 0 1 0 0  Number falls in past yr: 0 0 1 0 0  Injury with Fall? 0 0 1 0 0  Risk for fall due to : History of fall(s) - - - -  Risk for fall due to: Comment - - - - -  Follow up Falls prevention discussed Falls evaluation completed - - Falls evaluation completed    Any stairs in or around the home? Yes  If so, are there any without handrails? No  Home free of loose throw rugs in walkways, pet beds, electrical cords, etc? Yes  Adequate lighting in your home to reduce risk of falls? Yes   ASSISTIVE DEVICES UTILIZED TO PREVENT FALLS:  Life alert? No  Use of a cane, walker or w/c? No  Grab bars in the bathroom? No  Shower chair or bench in shower? No  Elevated toilet seat or a handicapped toilet? No   TIMED UP AND GO:  Was the test performed? No . Telephonic visit.   Cognitive Function: pt declined 6CIT for 2021 AWV     6CIT Screen 06/15/2019  What Year? 0 points  What month? 0 points  What time? 0 points  Count back from 20 0 points  Months in reverse 4 points  Repeat phrase 2 points  Total Score 6    Immunizations Immunization History  Administered Date(s) Administered  .  Pneumococcal Conjugate-13 03/04/2020  . Tdap 07/27/2015    TDAP status: Up to date   Flu Vaccine status: Declined, Education has been provided regarding the importance of this  vaccine but patient still declined. Advised may receive this vaccine at local pharmacy or Health Dept. Aware to provide a copy of the vaccination record if obtained from local pharmacy or Health Dept. Verbalized acceptance and understanding.   Pneumococcal vaccine status: Up to date   Covid-19 vaccine status: Information provided on how to obtain vaccines.   Qualifies for Shingles Vaccine? Yes   Zostavax completed No   Shingrix Completed?: No.    Education has been provided regarding the importance of this vaccine. Patient has been advised to call insurance company to determine out of pocket expense if they have not yet received this vaccine. Advised may also receive vaccine at local pharmacy or Health Dept. Verbalized acceptance and understanding.  Screening Tests Health Maintenance  Topic Date Due  . OPHTHALMOLOGY EXAM  01/05/2020  . COVID-19 Vaccine (1) 07/03/2020 (Originally 12/20/1952)  . INFLUENZA VACCINE  11/06/2020 (Originally 03/09/2020)  . HEMOGLOBIN A1C  08/27/2020  . FOOT EXAM  10/22/2020  . PNA vac Low Risk Adult (2 of 2 - PPSV23) 03/04/2021  . TETANUS/TDAP  07/26/2025    Health Maintenance  Health Maintenance Due  Topic Date Due  . OPHTHALMOLOGY EXAM  01/05/2020    Colorectal cancer screening: No longer required.   Lung Cancer Screening: (Low Dose CT Chest recommended if Age 17-80 years, 30 pack-year currently smoking OR have quit w/in 15years.) does not qualify due to age but did have Chest CT on 03/20/20.  Additional Screening:  Hepatitis C Screening: does not qualify;   Vision Screening: Recommended annual ophthalmology exams for early detection of glaucoma and other disorders of the eye. Is the patient up to date with their annual eye exam?  No  Who is the provider or what is the name of the office in which the patient attends annual eye exams? [facility] Screening: Recommended annual dental exams for proper oral hygiene  Community Resource  Referral / Chronic Care Management: CRR required this visit?  No   CCM required this visit?  No      Plan:     I have personally reviewed and noted the following in the patient's chart:   . Medical and social history . Use of alcohol, tobacco or illicit drugs  . Current medications and supplements . Functional ability and status . Nutritional status . Physical activity . Advanced directives . List of other physicians . Hospitalizations, surgeries, and ER visits in previous 12 months . Vitals . Screenings to include cognitive, depression, and falls . Referrals and appointments  In addition, I have reviewed and discussed with patient certain preventive protocols, quality metrics, and best practice recommendations. A written personalized care plan for preventive services as well as general preventive health recommendations were provided to patient.     Clemetine Marker, LPN   24/11/9751   Nurse Notes: none

## 2020-06-17 NOTE — Patient Instructions (Signed)
Connor Aguilar , Thank you for taking time to come for your Medicare Wellness Visit. I appreciate your ongoing commitment to your health goals. Please review the following plan we discussed and let me know if I can assist you in the future.   Screening recommendations/referrals: Colonoscopy: no longer required Recommended yearly ophthalmology/optometry visit for glaucoma screening and checkup Recommended yearly dental visit for hygiene and checkup  Vaccinations: Influenza vaccine: declined Pneumococcal vaccine: done 03/04/20 Tdap vaccine: done 07/27/15 Shingles vaccine: Shingrix discussed. Please contact your pharmacy for coverage information.  Covid-19: declined  Advanced directives: Advance directive discussed with you today. Even though you declined this today please call our office should you change your mind and we can give you the proper paperwork for you to fill out.  Conditions/risks identified: Recommend increasing physical activity   Next appointment: Follow up in one year for your annual wellness visit.   Preventive Care 81 Years and Older, Male Preventive care refers to lifestyle choices and visits with your health care provider that can promote health and wellness. What does preventive care include?  A yearly physical exam. This is also called an annual well check.  Dental exams once or twice a year.  Routine eye exams. Ask your health care provider how often you should have your eyes checked.  Personal lifestyle choices, including:  Daily care of your teeth and gums.  Regular physical activity.  Eating a healthy diet.  Avoiding tobacco and drug use.  Limiting alcohol use.  Practicing safe sex.  Taking low doses of aspirin every day.  Taking vitamin and mineral supplements as recommended by your health care provider. What happens during an annual well check? The services and screenings done by your health care provider during your annual well check will depend  on your age, overall health, lifestyle risk factors, and family history of disease. Counseling  Your health care provider may ask you questions about your:  Alcohol use.  Tobacco use.  Drug use.  Emotional well-being.  Home and relationship well-being.  Sexual activity.  Eating habits.  History of falls.  Memory and ability to understand (cognition).  Work and work Statistician. Screening  You may have the following tests or measurements:  Height, weight, and BMI.  Blood pressure.  Lipid and cholesterol levels. These may be checked every 5 years, or more frequently if you are over 81 years old.  Skin check.  Lung cancer screening. You may have this screening every year starting at age 81 if you have a 30-pack-year history of smoking and currently smoke or have quit within the past 15 years.  Fecal occult blood test (FOBT) of the stool. You may have this test every year starting at age 81.  Flexible sigmoidoscopy or colonoscopy. You may have a sigmoidoscopy every 5 years or a colonoscopy every 10 years starting at age 81.  Prostate cancer screening. Recommendations will vary depending on your family history and other risks.  Hepatitis C blood test.  Hepatitis B blood test.  Sexually transmitted disease (STD) testing.  Diabetes screening. This is done by checking your blood sugar (glucose) after you have not eaten for a while (fasting). You may have this done every 1-3 years.  Abdominal aortic aneurysm (AAA) screening. You may need this if you are a current or former smoker.  Osteoporosis. You may be screened starting at age 29 if you are at high risk. Talk with your health care provider about your test results, treatment options, and if necessary, the  need for more tests. Vaccines  Your health care provider may recommend certain vaccines, such as:  Influenza vaccine. This is recommended every year.  Tetanus, diphtheria, and acellular pertussis (Tdap, Td)  vaccine. You may need a Td booster every 10 years.  Zoster vaccine. You may need this after age 7.  Pneumococcal 13-valent conjugate (PCV13) vaccine. One dose is recommended after age 27.  Pneumococcal polysaccharide (PPSV23) vaccine. One dose is recommended after age 90. Talk to your health care provider about which screenings and vaccines you need and how often you need them. This information is not intended to replace advice given to you by your health care provider. Make sure you discuss any questions you have with your health care provider. Document Released: 08/22/2015 Document Revised: 04/14/2016 Document Reviewed: 05/27/2015 Elsevier Interactive Patient Education  2017 [place] Prevention in the Home Falls can cause injuries. They can happen to people of all ages. There are many things you can do to make your home safe and to help prevent falls. What can I do on the outside of my home?  Regularly fix the edges of walkways and driveways and fix any cracks.  Remove anything that might make you trip as you walk through a door, such as a raised step or threshold.  Trim any bushes or trees on the path to your home.  Use bright outdoor lighting.  Clear any walking paths of anything that might make someone trip, such as rocks or tools.  Regularly check to see if handrails are loose or broken. Make sure that both sides of any steps have handrails.  Any raised decks and porches should have guardrails on the edges.  Have any leaves, snow, or ice cleared regularly.  Use sand or salt on walking paths during winter.  Clean up any spills in your garage right away. This includes oil or grease spills. What can I do in the bathroom?  Use night lights.  Install grab bars by the toilet and in the tub and shower. Do not use towel bars as grab bars.  Use non-skid mats or decals in the tub or shower.  If you need to sit down in the shower, use a plastic, non-slip  stool.  Keep the floor dry. Clean up any water that spills on the floor as soon as it happens.  Remove soap buildup in the tub or shower regularly.  Attach bath mats securely with double-sided non-slip rug tape.  Do not have throw rugs and other things on the floor that can make you trip. What can I do in the bedroom?  Use night lights.  Make sure that you have a light by your bed that is easy to reach.  Do not use any sheets or blankets that are too big for your bed. They should not hang down onto the floor.  Have a firm chair that has side arms. You can use this for support while you get dressed.  Do not have throw rugs and other things on the floor that can make you trip. What can I do in the kitchen?  Clean up any spills right away.  Avoid walking on wet floors.  Keep items that you use a lot in easy-to-reach places.  If you need to reach something above you, use a strong step stool that has a grab bar.  Keep electrical cords out of the way.  Do not use floor polish or wax that makes floors slippery. If you must use wax,  use non-skid floor wax.  Do not have throw rugs and other things on the floor that can make you trip. What can I do with my stairs?  Do not leave any items on the stairs.  Make sure that there are handrails on both sides of the stairs and use them. Fix handrails that are broken or loose. Make sure that handrails are as long as the stairways.  Check any carpeting to make sure that it is firmly attached to the stairs. Fix any carpet that is loose or worn.  Avoid having throw rugs at the top or bottom of the stairs. If you do have throw rugs, attach them to the floor with carpet tape.  Make sure that you have a light switch at the top of the stairs and the bottom of the stairs. If you do not have them, ask someone to add them for you. What else can I do to help prevent falls?  Wear shoes that:  Do not have high heels.  Have rubber bottoms.  Are  comfortable and fit you well.  Are closed at the toe. Do not wear sandals.  If you use a stepladder:  Make sure that it is fully opened. Do not climb a closed stepladder.  Make sure that both sides of the stepladder are locked into place.  Ask someone to hold it for you, if possible.  Clearly mark and make sure that you can see:  Any grab bars or handrails.  First and last steps.  Where the edge of each step is.  Use tools that help you move around (mobility aids) if they are needed. These include:  Canes.  Walkers.  Scooters.  Crutches.  Turn on the lights when you go into a dark area. Replace any light bulbs as soon as they burn out.  Set up your furniture so you have a clear path. Avoid moving your furniture around.  If any of your floors are uneven, fix them.  If there are any pets around you, be aware of where they are.  Review your medicines with your doctor. Some medicines can make you feel dizzy. This can increase your chance of falling. Ask your doctor what other things that you can do to help prevent falls. This information is not intended to replace advice given to you by your health care provider. Make sure you discuss any questions you have with your health care provider. Document Released: 05/22/2009 Document Revised: 01/01/2016 Document Reviewed: 08/30/2014 Elsevier Interactive Patient Education  2017 [employer].

## 2020-06-25 ENCOUNTER — Telehealth: Payer: Self-pay | Admitting: *Deleted

## 2020-06-25 NOTE — Chronic Care Management (AMB) (Signed)
  Care Management   Note  06/25/2020 Name: Connor Aguilar MRN: [id number] DOB: [birth year]  Connor Aguilar is a 79 y.o. year old male who is a primary care patient of Laurell Roof and is actively engaged with the care management team. I reached out to [name] by phone today to assist with re-scheduling a follow up visit with the Pharmacist  Follow up plan: Unsuccessful telephone outreach attempt made. A HIPAA compliant phone message was left for the patient providing contact information and requesting a return call. The care management team will reach out to the patient again over the next 7 days. If patient returns call to provider office, please advise to call [facility] at [phone].  [place] [facility]  Direct Dial: [phone]

## 2020-07-02 NOTE — Chronic Care Management (AMB) (Signed)
  Care Management   Note  07/02/2020 Name: Quade Ramirez MRN: [id number] DOB: Dec 24, 1940  Salomon Ganser is a 79 y.o. year old male who is a primary care patient of Laurell Roof and is actively engaged with the care management team. I reached out to [name] by phone today to assist with re-scheduling a follow up visit with the Pharmacist  Follow up plan: Telephone appointment with care management team member scheduled for: 07/17/2020  [place] [facility]  Direct Dial: [phone]

## 2020-07-06 DIAGNOSIS — Z20822 Contact with and (suspected) exposure to covid-19: Secondary | ICD-10-CM | POA: Diagnosis not present

## 2020-07-08 ENCOUNTER — Telehealth: Payer: Self-pay

## 2020-07-08 NOTE — Telephone Encounter (Signed)
Copied from [place] [id number]. Topic: General - Other >> Jul 07, 2020  4:50 PM Yvette Rack wrote: Reason for CRM: Pt stated the ENT specialist would not schedule him until he had a Covid test. Pt stated he finally received negative Covid results and he would like to proceed with getting an appt with the ENT specialist. Suggested pt contact ENT office to schedule but he said the office scheduled the appt last time. Pt requests call back

## 2020-07-08 NOTE — Telephone Encounter (Signed)
Spoke to Vhris at ENT and he will call and schedule patient an appointment

## 2020-07-17 ENCOUNTER — Ambulatory Visit: Payer: Medicare HMO

## 2020-07-17 DIAGNOSIS — I10 Essential (primary) hypertension: Secondary | ICD-10-CM

## 2020-07-17 DIAGNOSIS — Z720 Tobacco use: Secondary | ICD-10-CM

## 2020-07-17 NOTE — Patient Instructions (Signed)
Visit Information It was great speaking with you today!  Please let me know if you have any questions about our visit. Goals Addressed              This Visit's Progress   .  Hypertension - goal BP < 140/90 (pt-stated)   On track     [place] (see longitudinal plan of care for additional care plan information)  Current Barriers:  . Controlled hypertension, complicated by Diabetes, smoking, hyperlipidemia . Current antihypertensive regimen: Avalide . Previous antihypertensives tried: NA . Last practice recorded BP readings:  BP Readings from Last 3 Encounters:  10/23/19 110/64  09/27/18 124/78  05/16/18 138/84    Pharmacist Clinical Goal(s):  Marland Kitchen Over the next 90 days, patient will work with PharmD and providers to continue optimized antihypertensive regimen  Interventions: . Inter-disciplinary care team collaboration (see longitudinal plan of care) . Comprehensive medication review performed; medication list updated in the electronic medical record.   Patient Self Care Activities:  . Patient will focus on medication adherence by reviewing provider after visit summary  . Making medication changes immediately following your provider visit  Initial goal documentation        The patient verbalized understanding of instructions, educational materials, and care plan provided today and agreed to receive a mailed copy of patient instructions, educational materials, and care plan.   The pharmacy team will reach out to the patient again over the next 21 days.   [facility] [phone]

## 2020-07-17 NOTE — Chronic Care Management (AMB) (Signed)
Chronic Care Management Pharmacy  Name: Connor Aguilar  MRN: [id number] DOB: Mar 25, 1941  Chief Complaint/ HPI  Connor Aguilar,  79 y.o. , male presents for their Follow-Up CCM visit with the clinical pharmacist via telephone.  Patient has been suffering from throat pain which has made it very challenging for patient to swallow food or his medications. Patient has an appointment on 12/16 with ENT to discuss his concerns. Recommended patient crush and mix his medications in applesauce or pudding to help with swallowing.   PCP : Connor Grana, PA-C  Their chronic conditions include: DM, HLD, HTN, Tobacco abuse  Office Visits: 9/13 wt loss, [name], BP 112/74 P 106 Wt 131 BMI 20.0, L rib pain, fall, refuses Boost, hemoptysis, hypoxic, appetite loss, still on metformin 107m daily, d/c irbesartan-HCTZ, start irbesartan  Consult Visit: 10/5 SOB, [name], BP 100/58 P 63 Wt 131 BMI 19.9 sore throat x months, current smoker 2 PPD, wt loss, refuses cessation or inhaler  Medications: Outpatient Encounter Medications as of 07/17/2020  Medication Sig  . acetaminophen (TYLENOL) 325 MG tablet Take 2 tablets (650 mg total) by mouth every 6 (six) hours as needed for mild pain (or Fever >/= 101).  .Marland KitchenamLODipine (NORVASC) 5 MG tablet TAKE (1) TABLET BY MOUTH EVERY DAY  . blood glucose meter kit and supplies Accuchek or whatever is covered by insurance; check sugars once a day only if desired; Dx E11.65, LON 99 months (Patient not taking: Reported on 05/22/2020)  . glucose blood test strip Use as instructed to check sugars once a day if desired; LON 99 months, Dx E11.65 (Patient not taking: Reported on 05/22/2020)  . irbesartan (AVAPRO) 150 MG tablet Take 1 tablet (150 mg total) by mouth daily.  . metFORMIN (GLUCOPHAGE) 1000 MG tablet Take 1 tablet (1,000 mg total) by mouth daily with breakfast. (Patient taking differently: Take 1,000 mg by mouth 2 (two) times daily with a meal. )  . pantoprazole (PROTONIX) 40  MG tablet Take 1 tablet (40 mg total) by mouth daily.  . rosuvastatin (CRESTOR) 5 MG tablet TAKE ONE TABLET EVERY DAY AT BEDTIME  . sitaGLIPtin (JANUVIA) 100 MG tablet Take 1 tablet (100 mg total) by mouth daily.   No facility-administered encounter medications on file as of 07/17/2020.   Financial Resource Strain: Low Risk   . Difficulty of Paying Living Expenses: Not hard at all   Current Diagnosis/Assessment:  SDOH Interventions   Flowsheet Row Most Recent Value  SDOH Interventions   Financial Strain Interventions Intervention Not Indicated  Transportation Interventions Intervention Not Indicated     Goals Addressed              This Visit's Progress   .  Hypertension - goal BP < 140/90 (pt-stated)   [place]     [place] for [place] care plan information[place]  Current Barriers:  . Controlled hypertension, complicated by Diabetes, smoking, hyperlipidemia . Current antihypertensive regimen: Avalide . Previous antihypertensives tried: NA . Last practice recorded BP readings:  BP Readings from Last 3 Encounters:  10/23/19 110/64  09/27/18 124/78  05/16/18 138/84    Pharmacist Clinical Goal(s):  .Marland KitchenOver the next 90 days, patient will work with PharmD and providers to continue optimized antihypertensive regimen  Interventions: . Inter-disciplinary care team collaboration (see longitudinal plan of care) . Comprehensive medication review performed; medication list updated in the electronic medical record.   Patient Self Care Activities:  . Patient will focus on medication  adherence by reviewing provider after visit summary  . Making medication changes immediately following your provider visit  Initial goal documentation       Diabetes   Recent Relevant Labs: Lab Results  Component Value Date/Time   HGBA1C 5.7 (H) 02/25/2020 03:03 PM   HGBA1C 6.5 (H) 10/23/2019 10:16 AM   MICROALBUR 0.2 12/13/2017 02:45 PM   MICROALBUR neg  11/18/2015 03:45 PM     Patient is currently controlled on the following medications:   Metformin 1078m with breakfast (takes twice daily)   Januvia 100 mg daily   Last diabetic Foot exam:  Lab Results  Component Value Date/Time   HMDIABEYEEXA No Retinopathy 01/05/2019 12:00 AM    Last diabetic Eye exam: No results found for: HMDIABFOOTEX   We discussed: Patient has resumed taking metformin twice daily. Denies hypoglycemia or GI symptoms with increased dose. States JCelesta Gentileis sometimes expensive but denies challenges with affording it at this time.  Could consider Metformin twice daily and discontinuing Januvia, but will not make any changes a this time.   Plan  Continue current medications   Hypertension   BP goal is:  <140/90  Office blood pressures are  BP Readings from Last 3 Encounters:  05/15/20 129/70  05/13/20 (!) 100/58  04/21/20 112/74   Lab Results  Component Value Date   CREATININE 0.90 04/21/2020   BUN 12 04/21/2020   GFRNONAA 80 04/21/2020   GFRAA 93 04/21/2020   NA 136 04/21/2020   K 4.7 04/21/2020   CALCIUM 10.0 04/21/2020   CO2 25 04/21/2020   Patient checks BP at home infrequently Patient home BP readings are ranging: NA  Patient has failed these meds in the past: irbesartan-HCTZ hypotension Patient is currently controlled on the following medications:  . Irbesartan 1527mdaily . Amlodipine 67m33maily  We discussed: Encourage patient to increase frequency of BP monitoring.   Plan  Continue current medications   Hyperlipidemia   LDL goal < 70  Last lipids Lab Results  Component Value Date   CHOL 94 10/23/2019   HDL 42 10/23/2019   LDLCALC 29 10/23/2019   TRIG 145 10/23/2019   CHOLHDL 2.2 10/23/2019   Hepatic Function Latest Ref Rng & Units 04/21/2020 02/25/2020 10/23/2019  Total Protein 6.1 - 8.1 g/dL 7.0 6.6 6.6  Albumin 3.5 - 5.0 g/dL - - -  AST 10 - 35 U/L '10 12 13  ' ALT 9 - 46 U/L '10 12 14  ' Alk Phosphatase 38 - 126 U/L - - -   Total Bilirubin 0.2 - 1.2 mg/dL 0.9 0.5 0.5     The ASCVD Risk score (Gof[place]et al., 2013) failed to calculate for the following reasons:   The 2013 ASCVD risk score is only valid for ages 40 105 79 75Patient has failed these meds in past: n/a Patient is currently controlled on the following medications:  . Rosuvastatin 5 mg daily  We discussed:  diet and exercise extensively  Plan  Continue current medications  Tobacco Abuse   Tobacco Status:  Social History   Tobacco Use  Smoking Status Current Every Day Smoker  . Packs/day: 1.50  . Years: 64.00  . Pack years: 96.00  . Types: Cigarettes  Smokeless Tobacco Never Used    Patient smokes Within 30 minutes of waking On a scale of 1-10, reports MOTIVATION to quit is 9  On a scale of 1-10, reports CONFIDENCE in quitting is 5  Previous quit attempts included: [place]  for 2 years) Patient is currently uncontrolled on the following medications:  . None  We discussed:  Patient set quit date of 12/26. Counseled on patch placement, side effects, and option to remove at night if they experience trouble sleeping or bad dreams.  Counseled to allow lozenge to dissolve and absorb in cheek pocket, rather than swallow, to reduce GI side effects. Provided contact information for [place] Quit Line (1-800-QUIT-NOW) and encouraged patient to reach out to this group for support.  Plan  Recommend starting Nicotine 21 mg patch for 6 weeks  Recommend starting Lozenge 4 mg PRN for breakthrough cravings  Medication Management   Pt uses [name] pharmacy for all medications Uses pill box? Yes  We discussed: Current pharmacy is preferred with insurance plan and patient is satisfied with pharmacy services  Plan  Continue current medication management strategy  Follow up: 1 month phone visit  [place] [phone]

## 2020-07-24 ENCOUNTER — Other Ambulatory Visit (HOSPITAL_COMMUNITY): Payer: Self-pay | Admitting: Otolaryngology

## 2020-07-24 ENCOUNTER — Other Ambulatory Visit: Payer: Self-pay | Admitting: Otolaryngology

## 2020-07-24 DIAGNOSIS — R221 Localized swelling, mass and lump, neck: Secondary | ICD-10-CM | POA: Diagnosis not present

## 2020-07-24 DIAGNOSIS — D380 Neoplasm of uncertain behavior of larynx: Secondary | ICD-10-CM | POA: Diagnosis not present

## 2020-07-24 DIAGNOSIS — F17200 Nicotine dependence, unspecified, uncomplicated: Secondary | ICD-10-CM | POA: Diagnosis not present

## 2020-07-24 DIAGNOSIS — R1314 Dysphagia, pharyngoesophageal phase: Secondary | ICD-10-CM | POA: Diagnosis not present

## 2020-07-29 ENCOUNTER — Ambulatory Visit
Admission: RE | Admit: 2020-07-29 | Discharge: 2020-07-29 | Disposition: A | Discharge status: Home / Self Care | Payer: Medicare HMO | Source: Ambulatory Visit | Attending: Otolaryngology | Admitting: Otolaryngology

## 2020-07-29 ENCOUNTER — Other Ambulatory Visit: Payer: Self-pay

## 2020-07-29 DIAGNOSIS — R634 Abnormal weight loss: Secondary | ICD-10-CM | POA: Diagnosis not present

## 2020-07-29 DIAGNOSIS — R221 Localized swelling, mass and lump, neck: Secondary | ICD-10-CM | POA: Diagnosis not present

## 2020-07-29 DIAGNOSIS — J387 Other diseases of larynx: Secondary | ICD-10-CM | POA: Diagnosis not present

## 2020-07-29 DIAGNOSIS — R1310 Dysphagia, unspecified: Secondary | ICD-10-CM | POA: Diagnosis not present

## 2020-07-29 LAB — POCT I-STAT CREATININE: Creatinine, Ser: 0.9 mg/dL (ref 0.61–1.24)

## 2020-07-29 MED ORDER — IOHEXOL 300 MG/ML  SOLN
75.0000 mL | Freq: Once | INTRAMUSCULAR | Status: AC | PRN
  Administered 2020-07-29: 75 mL via INTRAVENOUS

## 2020-07-30 ENCOUNTER — Other Ambulatory Visit: Payer: Self-pay | Admitting: Otolaryngology

## 2020-07-30 ENCOUNTER — Telehealth: Payer: Self-pay

## 2020-07-30 DIAGNOSIS — R1310 Dysphagia, unspecified: Secondary | ICD-10-CM

## 2020-07-30 NOTE — Progress Notes (Signed)
    Chronic Care Management Pharmacy Assistant   Name: Connor Aguilar  MRN: [id number] DOB: 1941/03/13  Reason for Encounter: Disease State 08/04/2020- pt requests call back later in day.   Patient Questions:  1.  Have you seen any other providers since your last visit? Yes, ENT  2.  Any changes in your medicines or health? Yes laryngeal cancer     PCP : Delsa Grana, PA-C  Allergies:  No Known Allergies  Medications: Outpatient Encounter Medications as of 07/30/2020  Medication Sig  . acetaminophen (TYLENOL) 325 MG tablet Take 2 tablets (650 mg total) by mouth every 6 (six) hours as needed for mild pain (or Fever >/= 101).  Marland Kitchen amLODipine (NORVASC) 5 MG tablet TAKE (1) TABLET BY MOUTH EVERY DAY  . blood glucose meter kit and supplies Accuchek or whatever is covered by insurance; check sugars once a day only if desired; Dx E11.65, LON 99 months (Patient not taking: Reported on 05/22/2020)  . glucose blood test strip Use as instructed to check sugars once a day if desired; LON 99 months, Dx E11.65 (Patient not taking: Reported on 05/22/2020)  . irbesartan (AVAPRO) 150 MG tablet Take 1 tablet (150 mg total) by mouth daily.  . metFORMIN (GLUCOPHAGE) 1000 MG tablet Take 1 tablet (1,000 mg total) by mouth daily with breakfast. (Patient taking differently: Take 1,000 mg by mouth 2 (two) times daily with a meal. )  . pantoprazole (PROTONIX) 40 MG tablet Take 1 tablet (40 mg total) by mouth daily.  . rosuvastatin (CRESTOR) 5 MG tablet TAKE ONE TABLET EVERY DAY AT BEDTIME  . sitaGLIPtin (JANUVIA) 100 MG tablet Take 1 tablet (100 mg total) by mouth daily.   No facility-administered encounter medications on file as of 07/30/2020.    Current Diagnosis: Patient Active Problem List   Diagnosis Date Noted  . Chronic arthropathy 10/25/2019  . Onychomycosis of multiple toenails with type 2 diabetes mellitus ([place]) 05/16/2018  . Erectile dysfunction 08/19/2015  . Tobacco abuse 04/09/2015  .  Hyperlipidemia 04/02/2015  . Essential hypertension 04/02/2015  . Panlobular emphysema ([place]) 04/02/2015    Goals Addressed   None    Multiple attempts made to reach pt. Call deferred for 1 month. PharmD aware.  Follow-Up:  Pharmacist Review  [facility] [employer] [facility]

## 2020-07-31 ENCOUNTER — Other Ambulatory Visit: Payer: Self-pay | Admitting: Otolaryngology

## 2020-07-31 ENCOUNTER — Other Ambulatory Visit: Payer: Self-pay | Admitting: Physician Assistant

## 2020-07-31 DIAGNOSIS — C329 Malignant neoplasm of larynx, unspecified: Secondary | ICD-10-CM

## 2020-07-31 DIAGNOSIS — L040 Acute lymphadenitis of face, head and neck: Secondary | ICD-10-CM

## 2020-08-04 ENCOUNTER — Other Ambulatory Visit: Payer: Self-pay | Admitting: Radiology

## 2020-08-04 NOTE — Progress Notes (Signed)
Patient on schedule for Cervical Lymph Node biopsy for 08/07/2020. Spoke with son on phone  And made aware to be here @ 1230, and if desires sedation NPO after 0600, and driver for discharge post procedure.stated understanding.

## 2020-08-07 ENCOUNTER — Ambulatory Visit
Admission: RE | Admit: 2020-08-07 | Discharge: 2020-08-07 | Disposition: A | Discharge status: Home / Self Care | Payer: Medicare HMO | Source: Ambulatory Visit | Attending: Otolaryngology | Admitting: Otolaryngology

## 2020-08-07 ENCOUNTER — Other Ambulatory Visit: Payer: Self-pay

## 2020-08-07 ENCOUNTER — Other Ambulatory Visit: Payer: Self-pay | Admitting: Otolaryngology

## 2020-08-07 ENCOUNTER — Ambulatory Visit: Payer: Medicare HMO | Admitting: Podiatry

## 2020-08-07 DIAGNOSIS — L040 Acute lymphadenitis of face, head and neck: Secondary | ICD-10-CM | POA: Diagnosis present

## 2020-08-07 DIAGNOSIS — C760 Malignant neoplasm of head, face and neck: Secondary | ICD-10-CM | POA: Diagnosis not present

## 2020-08-07 DIAGNOSIS — C770 Secondary and unspecified malignant neoplasm of lymph nodes of head, face and neck: Secondary | ICD-10-CM | POA: Insufficient documentation

## 2020-08-07 DIAGNOSIS — C801 Malignant (primary) neoplasm, unspecified: Secondary | ICD-10-CM | POA: Diagnosis not present

## 2020-08-07 DIAGNOSIS — R221 Localized swelling, mass and lump, neck: Secondary | ICD-10-CM | POA: Diagnosis not present

## 2020-08-07 DIAGNOSIS — C328 Malignant neoplasm of overlapping sites of larynx: Secondary | ICD-10-CM | POA: Diagnosis not present

## 2020-08-07 NOTE — Procedures (Signed)
Interventional Radiology Procedure Note  Procedure: [place] FNA AND CORE BXS OF THE RT NECK NODAL MASS    Complications: None  Estimated Blood Loss:  MIN  Findings: FULL REPORT IN PACS    Sharen Counter, MD

## 2020-08-07 NOTE — Discharge Instructions (Signed)

## 2020-08-12 ENCOUNTER — Other Ambulatory Visit: Payer: Self-pay | Admitting: Otolaryngology

## 2020-08-12 LAB — CYTOLOGY - NON PAP

## 2020-08-12 LAB — SURGICAL PATHOLOGY

## 2020-08-13 ENCOUNTER — Other Ambulatory Visit: Payer: Self-pay

## 2020-08-13 ENCOUNTER — Ambulatory Visit
Admission: RE | Admit: 2020-08-13 | Discharge: 2020-08-13 | Disposition: A | Discharge status: Home / Self Care | Payer: Medicare HMO | Source: Ambulatory Visit | Attending: Otolaryngology | Admitting: Otolaryngology

## 2020-08-13 DIAGNOSIS — I2510 Atherosclerotic heart disease of native coronary artery without angina pectoris: Secondary | ICD-10-CM | POA: Diagnosis not present

## 2020-08-13 DIAGNOSIS — C760 Malignant neoplasm of head, face and neck: Secondary | ICD-10-CM | POA: Diagnosis not present

## 2020-08-13 DIAGNOSIS — Y929 Unspecified place or not applicable: Secondary | ICD-10-CM | POA: Insufficient documentation

## 2020-08-13 DIAGNOSIS — J387 Other diseases of larynx: Secondary | ICD-10-CM | POA: Insufficient documentation

## 2020-08-13 DIAGNOSIS — R591 Generalized enlarged lymph nodes: Secondary | ICD-10-CM | POA: Diagnosis not present

## 2020-08-13 DIAGNOSIS — Y939 Activity, unspecified: Secondary | ICD-10-CM | POA: Diagnosis not present

## 2020-08-13 DIAGNOSIS — I700 Atherosclerosis of aorta: Secondary | ICD-10-CM | POA: Diagnosis not present

## 2020-08-13 DIAGNOSIS — S2220XA Unspecified fracture of sternum, initial encounter for closed fracture: Secondary | ICD-10-CM | POA: Insufficient documentation

## 2020-08-13 DIAGNOSIS — C329 Malignant neoplasm of larynx, unspecified: Secondary | ICD-10-CM

## 2020-08-13 DIAGNOSIS — R1310 Dysphagia, unspecified: Secondary | ICD-10-CM | POA: Diagnosis not present

## 2020-08-13 DIAGNOSIS — T17320A Food in larynx causing asphyxiation, initial encounter: Secondary | ICD-10-CM | POA: Diagnosis not present

## 2020-08-13 DIAGNOSIS — R1319 Other dysphagia: Secondary | ICD-10-CM | POA: Diagnosis not present

## 2020-08-13 DIAGNOSIS — R599 Enlarged lymph nodes, unspecified: Secondary | ICD-10-CM | POA: Diagnosis not present

## 2020-08-13 LAB — GLUCOSE, CAPILLARY: Glucose-Capillary: 74 mg/dL (ref 70–99)

## 2020-08-13 MED ORDER — FLUDEOXYGLUCOSE F - 18 (FDG) INJECTION
6.7300 | Freq: Once | INTRAVENOUS | Status: AC | PRN
  Administered 2020-08-13: 6.73 via INTRAVENOUS

## 2020-08-13 NOTE — Therapy (Signed)
[facility] [place], [place], [address] Phone: [phone]   Fax:     Modified Barium Swallow  Patient Details  Name: Connor Aguilar MRN: [id number] Date of Birth: 18-Nov-1940 No data recorded  Encounter Date: 08/13/2020   End of Session - 08/13/20 1337    Visit Number 1    Number of Visits 1    Date for SLP Re-Evaluation 08/13/20    SLP Start Time 36    SLP Stop Time  1148    SLP Time Calculation (min) 33 min    Activity Tolerance Patient tolerated treatment well          Objective Swallowing Evaluation: Type of Study: MBS-Modified Barium Swallow Study   Patient Details  Name: Connor Aguilar MRN: [id number] Date of Birth: 1940/12/10  Today's Date: 08/13/2020 Time: 11:15-11:48 No data recorded  Past Medical History:  Past Medical History:  Diagnosis Date   Allergy    Diabetes mellitus without complication ([place])    Elevated CPK 11/18/2015   Hyperlipidemia    Hypertension    Tobacco abuse    Past Surgical History:  Past Surgical History:  Procedure Laterality Date   FOOT SURGERY     HPI: Patient is an 80 y.o. male referred by Dr. Pryor Ochoa for MBS after R epiglottic mass identified on laryngoscopy on 07/24/20. CT Neck 07/29/20 revealed large mucosal/submucosal mass 3.1 x 4.3 x3.5cm involving the majority of the larynx bilaterally, extending from the epiglottis to the vocal cords. 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 PET scan just prior to today's MBS confirms laryngeal mass and associated nodal disease bilateral neck and upper chest. Treatment plan pending. Patient reports 4 month history of dysphagia with 60 lb weight loss, hoarseness, spitting up blood. Reports "strangling" with liquids.   Subjective: Pt is hungry, eager to complete test so he can eat    Assessment / Plan / Recommendation  CHL IP CLINICAL IMPRESSIONS 08/13/2020  Clinical Impression  Patient presents with mild-moderate oropharyngeal and mild pharyngoesophageal dysphagia. Mildly reduced oral control with intermittent premature spillage of liquids to the valleculae. Swallow initiation is timely at the level of the base of tongue and occasionally valleculae, however incomplete epiglottic deflection/airway closure results in laryngeal penetration with thin and nectar thick liquids during the swallow, some of which is eventually aspirated silently. He is able to eject some penetration and shallower aspiration with cued throat clear or cough. There is no laryngeal penetration or aspiration of puree or mechanical soft solid. Mildly reduced base of tongue retraction, resulting in mild base of tongue, valleculae and pyriform sinus residue with liquids, which reduces with cued and at times pt's own reflexive dry swallow. Amplitude and duration of cricopharyngeus opening appears within normal limits. Appears to have thickened tissue around the cervical esophagus. Intermittent mild residue/retention in the cervical esophagus just below the cricopharyngeus with all consistencies; reduces with subsequent swallow; consider esophageal assessment. Several compensatory maneuvers were attempted; including L and R head turns, which were not effective. Chin tuck was somewhat effective with thin liquids; prevented penetration in one attempt, and reduced amount of penetration in a second trial. This strategy, combined with cough/throat clear after liquids was reviewed with patient and his son after the assessment. Advise continue soft diet with thin liquids using chin tuck and throat clear/cough, multiple dry swallows with thin liquids. Patient would also benefit from referral for outpatient speech therapy for management of dysphagia and any treatment-related effects on  swallow function. Recommend meds whole in puree; crush larger pills as pt reports difficulty with these.  SLP Visit Diagnosis Dysphagia,  oropharyngeal phase (R13.12);Dysphagia, pharyngoesophageal phase (R13.14)  Attention and concentration deficit following --  Frontal lobe and executive function deficit following --  Impact on safety and function Mild aspiration risk;Moderate aspiration risk      CHL IP TREATMENT RECOMMENDATION 08/13/2020  Treatment Recommendations Defer treatment plan to f/u with SLP     No flowsheet data found.  CHL IP DIET RECOMMENDATION 08/13/2020  SLP Diet Recommendations Regular solids;Dysphagia 3 (Mech soft) solids;Thin liquid  Liquid Administration via Cup  Medication Administration Whole meds with puree  Compensations Slow rate;Small sips/bites;Multiple dry swallows after each bite/sip;Effortful swallow;Chin tuck;Clear throat after each swallow  Postural Changes Seated upright at 90 degrees      CHL IP OTHER RECOMMENDATIONS 08/13/2020  Recommended Consults Consider esophageal assessment  Oral Care Recommendations Oral care before and after PO  Other Recommendations --      CHL IP FOLLOW UP RECOMMENDATIONS 08/13/2020  Follow up Recommendations Outpatient SLP      No flowsheet data found.         CHL IP ORAL PHASE 08/13/2020  Oral Phase Impaired  Oral - Pudding Teaspoon --  Oral - Pudding Cup --  Oral - Honey Teaspoon --  Oral - Honey Cup --  Oral - Nectar Teaspoon --  Oral - Nectar Cup Premature spillage  Oral - Nectar Straw --  Oral - Thin Teaspoon --  Oral - Thin Cup Premature spillage  Oral - Thin Straw --  Oral - Puree --  Oral - Mech Soft --  Oral - Regular --  Oral - Multi-Consistency --  Oral - Pill --  Oral Phase - Comment --    CHL IP PHARYNGEAL PHASE 08/13/2020  Pharyngeal Phase Impaired  Pharyngeal- Pudding Teaspoon --  Pharyngeal --  Pharyngeal- Pudding Cup --  Pharyngeal --  Pharyngeal- Honey Teaspoon --  Pharyngeal --  Pharyngeal- Honey Cup --  Pharyngeal --  Pharyngeal- Nectar Teaspoon --  Pharyngeal --  Pharyngeal- Nectar Cup Delayed swallow  initiation-vallecula;Reduced epiglottic inversion;Reduced laryngeal elevation;Reduced airway/laryngeal closure;Reduced tongue base retraction;Penetration/Aspiration during swallow;Penetration/Apiration after swallow;Moderate aspiration;Pharyngeal residue - valleculae;Pharyngeal residue - pyriform;Pharyngeal residue - cp segment  Pharyngeal Material enters airway, passes BELOW cords without attempt by patient to eject out (silent aspiration)  Pharyngeal- Nectar Straw --  Pharyngeal --  Pharyngeal- Thin Teaspoon --  Pharyngeal --  Pharyngeal- Thin Cup Reduced epiglottic inversion;Reduced laryngeal elevation;Reduced airway/laryngeal closure;Reduced tongue base retraction;Penetration/Aspiration during swallow;Penetration/Apiration after swallow;Trace aspiration;Pharyngeal residue - valleculae;Pharyngeal residue - pyriform;Pharyngeal residue - cp segment  Pharyngeal Material does not enter airway;Material enters airway, remains ABOVE vocal cords and not ejected out;Material enters airway, CONTACTS cords and not ejected out;Material enters airway, passes BELOW cords without attempt by patient to eject out (silent aspiration)  Pharyngeal- Thin Straw --  Pharyngeal --  Pharyngeal- Puree Reduced laryngeal elevation;Reduced epiglottic inversion;Pharyngeal residue - cp segment  Pharyngeal Material does not enter airway  Pharyngeal- Mechanical Soft Reduced epiglottic inversion;Reduced laryngeal elevation;Pharyngeal residue - cp segment  Pharyngeal --  Pharyngeal- Regular --  Pharyngeal --  Pharyngeal- Multi-consistency --  Pharyngeal --  Pharyngeal- Pill --  Pharyngeal --  Pharyngeal Comment --     CHL IP CERVICAL ESOPHAGEAL PHASE 08/13/2020  Cervical Esophageal Phase Impaired  Pudding Teaspoon --  Pudding Cup --  Honey Teaspoon --  Honey Cup --  Nectar Teaspoon --  Nectar Cup Other (Comment)  Nectar Straw --  Thin Teaspoon --  Thin Cup Other (Comment)  Thin Straw --  Puree Other (Comment)   Mechanical Soft Other (Comment)  Regular --  Multi-consistency --  Pill --  Cervical Esophageal Comment --    Deneise Lever, MS, CCC-SLP Speech-Language Pathologist  Aliene Altes 08/13/2020, 5:27 PM                                            Patient will benefit from skilled therapeutic intervention in order to improve the following deficits and impairments:   Dysphagia, unspecified type - Plan: DG SWALLOW FUNC OP MEDICARE SPEECH PATH, DG SWALLOW FUNC OP MEDICARE SPEECH PATH        Problem List Patient Active Problem List   Diagnosis Date Noted   Chronic arthropathy 10/25/2019   Onychomycosis of multiple toenails with type 2 diabetes mellitus ([place]) 05/16/2018   Erectile dysfunction 08/19/2015   Tobacco abuse 04/09/2015   Hyperlipidemia 04/02/2015   Essential hypertension 04/02/2015   Panlobular emphysema ([place]) 04/02/2015    Aliene Altes 08/13/2020, 5:27 PM  [facility] [place], [place], [address] Phone: [phone]   Fax:     Name: Connor Aguilar MRN: [id number] Date of Birth: November 23, 1940

## 2020-08-14 ENCOUNTER — Ambulatory Visit: Payer: Medicare HMO | Admitting: Podiatry

## 2020-08-14 ENCOUNTER — Telehealth: Payer: Self-pay

## 2020-08-14 ENCOUNTER — Ambulatory Visit: Payer: Medicare HMO | Admitting: Gastroenterology

## 2020-08-14 ENCOUNTER — Encounter: Payer: Self-pay | Admitting: Gastroenterology

## 2020-08-14 DIAGNOSIS — R490 Dysphonia: Secondary | ICD-10-CM | POA: Diagnosis not present

## 2020-08-14 DIAGNOSIS — C329 Malignant neoplasm of larynx, unspecified: Secondary | ICD-10-CM | POA: Diagnosis not present

## 2020-08-14 NOTE — Chronic Care Management (AMB) (Deleted)
Chronic Care Management Pharmacy  Name: Connor Aguilar  MRN: [id number] DOB: 21-Dec-1940  Chief Complaint/ HPI  Connor Aguilar,  80 y.o. , male presents for their Follow-Up CCM visit with the clinical pharmacist via telephone.  PCP : Delsa Grana, PA-C  Their chronic conditions include: DM, HLD, HTN, Tobacco abuse  Office Visits: 9/13 wt loss, [name], BP 112/74 P 106 Wt 131 BMI 20.0, L rib pain, fall, refuses Boost, hemoptysis, hypoxic, appetite loss, still on metformin 106m daily, d/c irbesartan-HCTZ, start irbesartan  Consult Visit: 07/29/20: CT of throat revealed large carcinoma mass. 10/5 SOB, [name], BP 100/58 P 63 Wt 131 BMI 19.9 sore throat x months, current smoker 2 PPD, wt loss, refuses cessation or inhaler  Medications: Outpatient Encounter Medications as of 08/14/2020  Medication Sig  . acetaminophen (TYLENOL) 325 MG tablet Take 2 tablets (650 mg total) by mouth every 6 (six) hours as needed for mild pain (or Fever >/= 101).  .Marland KitchenamLODipine (NORVASC) 5 MG tablet TAKE (1) TABLET BY MOUTH EVERY DAY  . blood glucose meter kit and supplies Accuchek or whatever is covered by insurance; check sugars once a day only if desired; Dx E11.65, LON 99 months (Patient not taking: Reported on 05/22/2020)  . glucose blood test strip Use as instructed to check sugars once a day if desired; LON 99 months, Dx E11.65 (Patient not taking: Reported on 05/22/2020)  . irbesartan (AVAPRO) 150 MG tablet Take 1 tablet (150 mg total) by mouth daily.  . metFORMIN (GLUCOPHAGE) 1000 MG tablet Take 1 tablet (1,000 mg total) by mouth daily with breakfast. (Patient taking differently: Take 1,000 mg by mouth 2 (two) times daily with a meal. )  . pantoprazole (PROTONIX) 40 MG tablet Take 1 tablet (40 mg total) by mouth daily.  . rosuvastatin (CRESTOR) 5 MG tablet TAKE ONE TABLET EVERY DAY AT BEDTIME  . sitaGLIPtin (JANUVIA) 100 MG tablet Take 1 tablet (100 mg total) by mouth daily.   No facility-administered  encounter medications on file as of 08/14/2020.   Financial Resource Strain: Low Risk   . Difficulty of Paying Living Expenses: Not hard at all   Current Diagnosis/Assessment:   Goals Addressed   None    Diabetes   Recent Relevant Labs: Lab Results  Component Value Date/Time   HGBA1C 5.7 (H) 02/25/2020 03:03 PM   HGBA1C 6.5 (H) 10/23/2019 10:16 AM   MICROALBUR 0.2 12/13/2017 02:45 PM   MICROALBUR neg 11/18/2015 03:45 PM     Patient is currently controlled on the following medications:   Metformin 10056mwith breakfast (takes twice daily)   Januvia 100 mg daily   Last diabetic Foot exam:  Lab Results  Component Value Date/Time   HMDIABEYEEXA No Retinopathy 01/05/2019 12:00 AM    Last diabetic Eye exam: No results found for: HMDIABFOOTEX   We discussed: Patient has resumed taking metformin twice daily. Denies hypoglycemia or GI symptoms with increased dose. States [name] sometimes expensive but denies challenges with affording it at this time.  Could consider Metformin twice daily and discontinuing Januvia, but will not make any changes a this time.   Plan  Continue current medications   Hypertension   BP goal is:  <140/90  Office blood pressures are  BP Readings from Last 3 Encounters:  08/07/20 131/68  05/15/20 129/70  05/13/20 (!) 100/58   Lab Results  Component Value Date   CREATININE 0.90 07/29/2020   BUN 12 04/21/2020   GFRNONAA 80 04/21/2020   GFRAA 93 04/21/2020  NA 136 04/21/2020   K 4.7 04/21/2020   CALCIUM 10.0 04/21/2020   CO2 25 04/21/2020   Patient checks BP at home infrequently Patient home BP readings are ranging: NA  Patient has failed these meds in the past: irbesartan-HCTZ hypotension Patient is currently controlled on the following medications:  . Irbesartan 145m daily . Amlodipine 599mdaily  We discussed: Encourage patient to increase frequency of BP monitoring.   Plan  Continue current medications   Hyperlipidemia    LDL goal < 70  Last lipids Lab Results  Component Value Date   CHOL 94 10/23/2019   HDL 42 10/23/2019   LDLCALC 29 10/23/2019   TRIG 145 10/23/2019   CHOLHDL 2.2 10/23/2019   Hepatic Function Latest Ref Rng & Units 04/21/2020 02/25/2020 10/23/2019  Total Protein 6.1 - 8.1 g/dL 7.0 6.6 6.6  Albumin 3.5 - 5.0 g/dL - - -  AST 10 - 35 U/L '10 12 13  ' ALT 9 - 46 U/L '10 12 14  ' Alk Phosphatase 38 - 126 U/L - - -  Total Bilirubin 0.2 - 1.2 mg/dL 0.9 0.5 0.5     The ASCVD Risk score (GoBeasley et al., 2013) failed to calculate for the following reasons:   The 2013 ASCVD risk score is only valid for ages 4010o 7957 Patient has failed these meds in past: n/a Patient is currently controlled on the following medications:  . Rosuvastatin 5 mg daily  We discussed:  diet and exercise extensively  Plan  Continue current medications  Tobacco Abuse   Tobacco Status:  Social History   Tobacco Use  Smoking Status Current Every Day Smoker  . Packs/day: 1.50  . Years: 64.00  . Pack years: 96.00  . Types: Cigarettes  Smokeless Tobacco Never Used    Patient smokes Within 30 minutes of waking On a scale of 1-10, reports MOTIVATION to quit is 9  On a scale of 1-10, reports CONFIDENCE in quitting is 5  Previous quit attempts included: Cold [place] for 2 years) Patient is currently uncontrolled on the following medications:  . None  We discussed:  Patient set quit date of 12/26. Counseled on patch placement, side effects, and option to remove at night if they experience trouble sleeping or bad dreams.  Counseled to allow lozenge to dissolve and absorb in cheek pocket, rather than swallow, to reduce GI side effects. Provided contact information for [place] [facility]uit [facility] (1-800-QUIT-NOW) and encouraged patient to reach out to this group for support.  Plan  Recommend starting Nicotine 21 mg patch for 6 weeks  Recommend starting Lozenge 4 mg PRN for breakthrough cravings  Medication  Management   Pt uses [name] pharmacy for all medications Uses pill box? Yes  We discussed: Current pharmacy is preferred with insurance plan and patient is satisfied with pharmacy services  Plan  Continue current medication management strategy  Follow up: 1 month phone visit  [phone]

## 2020-08-19 ENCOUNTER — Other Ambulatory Visit: Payer: Self-pay | Admitting: *Deleted

## 2020-08-19 ENCOUNTER — Encounter: Payer: Self-pay | Admitting: Oncology

## 2020-08-19 ENCOUNTER — Encounter (INDEPENDENT_AMBULATORY_CARE_PROVIDER_SITE_OTHER): Payer: Self-pay

## 2020-08-19 ENCOUNTER — Inpatient Hospital Stay: Payer: Medicare HMO

## 2020-08-19 ENCOUNTER — Inpatient Hospital Stay: Payer: Medicare HMO | Attending: Oncology | Admitting: Oncology

## 2020-08-19 VITALS — BP 127/79 | HR 73 | Temp 96.3°F | Resp 16 | Wt 123.1 lb

## 2020-08-19 DIAGNOSIS — C329 Malignant neoplasm of larynx, unspecified: Secondary | ICD-10-CM | POA: Insufficient documentation

## 2020-08-19 DIAGNOSIS — F17210 Nicotine dependence, cigarettes, uncomplicated: Secondary | ICD-10-CM | POA: Insufficient documentation

## 2020-08-19 DIAGNOSIS — I10 Essential (primary) hypertension: Secondary | ICD-10-CM | POA: Insufficient documentation

## 2020-08-19 DIAGNOSIS — J387 Other diseases of larynx: Secondary | ICD-10-CM

## 2020-08-19 DIAGNOSIS — G893 Neoplasm related pain (acute) (chronic): Secondary | ICD-10-CM | POA: Diagnosis not present

## 2020-08-19 DIAGNOSIS — Z7189 Other specified counseling: Secondary | ICD-10-CM | POA: Diagnosis not present

## 2020-08-19 DIAGNOSIS — R634 Abnormal weight loss: Secondary | ICD-10-CM | POA: Diagnosis not present

## 2020-08-19 DIAGNOSIS — Z515 Encounter for palliative care: Secondary | ICD-10-CM | POA: Insufficient documentation

## 2020-08-19 DIAGNOSIS — C4442 Squamous cell carcinoma of skin of scalp and neck: Secondary | ICD-10-CM | POA: Diagnosis not present

## 2020-08-19 LAB — CBC WITH DIFFERENTIAL/PLATELET
Abs Immature Granulocytes: 0.04 10*3/uL (ref 0.00–0.07)
Basophils Absolute: 0 10*3/uL (ref 0.0–0.1)
Basophils Relative: 0 %
Eosinophils Absolute: 0 10*3/uL (ref 0.0–0.5)
Eosinophils Relative: 0 %
HCT: 38.1 % — ABNORMAL LOW (ref 39.0–52.0)
Hemoglobin: 13.2 g/dL (ref 13.0–17.0)
Immature Granulocytes: 0 %
Lymphocytes Relative: 14 %
Lymphs Abs: 1.5 10*3/uL (ref 0.7–4.0)
MCH: 32.1 pg (ref 26.0–34.0)
MCHC: 34.6 g/dL (ref 30.0–36.0)
MCV: 92.7 fL (ref 80.0–100.0)
Monocytes Absolute: 0.9 10*3/uL (ref 0.1–1.0)
Monocytes Relative: 8 %
Neutro Abs: 8.2 10*3/uL — ABNORMAL HIGH (ref 1.7–7.7)
Neutrophils Relative %: 78 %
Platelets: 340 10*3/uL (ref 150–400)
RBC: 4.11 MIL/uL — ABNORMAL LOW (ref 4.22–5.81)
RDW: 14 % (ref 11.5–15.5)
WBC: 10.7 10*3/uL — ABNORMAL HIGH (ref 4.0–10.5)
nRBC: 0 % (ref 0.0–0.2)

## 2020-08-19 LAB — COMPREHENSIVE METABOLIC PANEL
ALT: 26 U/L (ref 0–44)
AST: 31 U/L (ref 15–41)
Albumin: 3.9 g/dL (ref 3.5–5.0)
Alkaline Phosphatase: 41 U/L (ref 38–126)
Anion gap: 12 (ref 5–15)
BUN: 12 mg/dL (ref 8–23)
CO2: 27 mmol/L (ref 22–32)
Calcium: 9.1 mg/dL (ref 8.9–10.3)
Chloride: 97 mmol/L — ABNORMAL LOW (ref 98–111)
Creatinine, Ser: 0.99 mg/dL (ref 0.61–1.24)
GFR, Estimated: >60 mL/min (ref >60)
Glucose, Bld: 114 mg/dL — ABNORMAL HIGH (ref 70–99)
Potassium: 3.2 mmol/L — ABNORMAL LOW (ref 3.5–5.1)
Sodium: 136 mmol/L (ref 135–145)
Total Bilirubin: 0.6 mg/dL (ref 0.3–1.2)
Total Protein: 7 g/dL (ref 6.5–8.1)

## 2020-08-19 MED ORDER — MORPHINE SULFATE (CONCENTRATE) 10 MG /0.5 ML PO SOLN: 5.0000 mg | ORAL | 0 refills | Status: DC | PRN

## 2020-08-19 NOTE — Progress Notes (Signed)
Hematology/Oncology Consult note [facility] Telephone:([phone] [phone]  Patient Care Team: Delsa Grana, PA-C as PCP - General (Family Medicine) Germaine Pomfret, [facility] as Pharmacist (Pharmacist)   Name of the patient: Connor Aguilar  [id number]  1940-12-02    Reason for referral-new diagnosis of laryngeal cancer   Referring physician-Dr. Carloyn Manner  Date of visit: 08/19/20   History of presenting illness- Patient is a 80 year old male with a past medical history significant for hypertension and borderline diabetes. He initially presented to GI with symptoms of dysphagia and was then referred to ENT. On comprehensive out laryngeal exam patient was found to have ulceration on the surface of right epiglottis with surrounding erythema. CT soft tissue neck with contrast showed bilateral laryngeal mass extending from the epiglottis to vocal cords and possible invasion into thyroid cartilage. Mass measures 3.1 x 4.3 x 3.5 cm. Enlarged necrotic level 2 to level 5 lymph nodes bilaterally with the largest 1 conglomerate measuring 4.5 x 3.1 cm.  Also had a PET scan which showed a laryngeal mass with bilateral involvement extending from epiglottis to vocal cords with bilateral nodal disease in the neck. Also noted to have a right paratracheal chain measuring 8 mm with an SUV of 4.3 and soft tissue density in the anterior mediastinum with an SUV of 4.6  Patient is also been referred to Dr. Frazier Butt from [facility] to discuss surgical options. He has had significant weight loss over the last 3 months and is down to 123 pounds. He currently reports symptoms of sore throat and pain during swallowing. He denies any problems with breathing    ECOG PS- 1  Pain scale- 4   Review of systems- Review of Systems  Constitutional: Positive for malaise/fatigue and weight loss. Negative for chills and fever.  HENT: Negative for congestion, ear discharge and  nosebleeds.   Eyes: Negative for blurred vision.  Respiratory: Negative for cough, hemoptysis, sputum production, shortness of breath and wheezing.   Cardiovascular: Negative for chest pain, palpitations, orthopnea and claudication.  Gastrointestinal: Negative for abdominal pain, blood in stool, constipation, diarrhea, heartburn, melena, nausea and vomiting.       Pain during swallowing  Genitourinary: Negative for dysuria, flank pain, frequency, hematuria and urgency.  Musculoskeletal: Negative for back pain, joint pain and myalgias.  Skin: Negative for rash.  Neurological: Negative for dizziness, tingling, focal weakness, seizures, weakness and headaches.  Endo/Heme/Allergies: Does not bruise/bleed easily.  Psychiatric/Behavioral: Negative for depression and suicidal ideas. The patient does not have insomnia.     No Known Allergies  Patient Active Problem List   Diagnosis Date Noted  . Chronic arthropathy 10/25/2019  . Onychomycosis of multiple toenails with type 2 diabetes mellitus ([place]) 05/16/2018  . Erectile dysfunction 08/19/2015  . Tobacco abuse 04/09/2015  . Hyperlipidemia 04/02/2015  . Essential hypertension 04/02/2015  . Panlobular emphysema ([place]) 04/02/2015     Past Medical History:  Diagnosis Date  . Allergy   . Diabetes mellitus without complication ([place])   . Elevated CPK 11/18/2015  . Hyperlipidemia   . Hypertension   . Tobacco abuse      Past Surgical History:  Procedure Laterality Date  . FOOT SURGERY      Social History   Socioeconomic History  . Marital status: Widowed    Spouse name: Not on file  . Number of children: 2  . Years of education: Not on file  . Highest education level: Not on file  Occupational History  . Not  on file  Tobacco Use  . Smoking status: Current Every Day Smoker    Packs/day: 1.50    Years: 64.00    Pack years: 96.00    Types: Cigarettes  . Smokeless tobacco: Never Used  Vaping Use  . Vaping Use: Never used   Substance and Sexual Activity  . Alcohol use: No    Alcohol/week: 0.0 standard drinks  . Drug use: No  . Sexual activity: Never  Other Topics Concern  . Not on file  Social History Narrative   Pt wife passed away 03-Nov-2014. Lives between his two sons.    Social Determinants of Health   Financial Resource Strain: Low Risk   . Difficulty of Paying Living Expenses: Not hard at all  Food Insecurity: No Food Insecurity  . Worried About Charity fundraiser in the Last Year: Never true  . Ran Out of Food in the Last Year: Never true  Transportation Needs: No Transportation Needs  . Lack of Transportation (Medical): No  . Lack of Transportation (Non-Medical): No  Physical Activity: Inactive  . Days of Exercise per Week: 0 days  . Minutes of Exercise per Session: 0 min  Stress: No Stress Concern Present  . Feeling of Stress : Not at all  Social Connections: Unknown  . Frequency of Communication with Friends and Family: Patient refused  . Frequency of Social Gatherings with Friends and Family: Patient refused  . Attends Religious Services: Patient refused  . Active Member of Clubs or Organizations: Patient refused  . Attends Archivist Meetings: Patient refused  . Marital Status: Widowed  Intimate Partner Violence: Not At Risk  . Fear of Current or Ex-Partner: No  . Emotionally Abused: No  . Physically Abused: No  . Sexually Abused: No     Family History  Problem Relation Age of Onset  . Diabetes Mother      Current Outpatient Medications:  .  acetaminophen (TYLENOL) 325 MG tablet, Take 2 tablets (650 mg total) by mouth every 6 (six) hours as needed for mild pain (or Fever >/= 101)., Disp: , Rfl:  .  amLODipine (NORVASC) 5 MG tablet, TAKE (1) TABLET BY MOUTH EVERY DAY, Disp: 90 tablet, Rfl: 3 .  irbesartan (AVAPRO) 150 MG tablet, Take 1 tablet (150 mg total) by mouth daily., Disp: 90 tablet, Rfl: 3 .  metFORMIN (GLUCOPHAGE) 1000 MG tablet, Take 1 tablet (1,000 mg total)  by mouth daily with breakfast. (Patient taking differently: Take 1,000 mg by mouth daily.), Disp: 180 tablet, Rfl: 3 .  pantoprazole (PROTONIX) 40 MG tablet, Take 1 tablet (40 mg total) by mouth daily., Disp: 30 tablet, Rfl: 3 .  rosuvastatin (CRESTOR) 5 MG tablet, TAKE ONE TABLET EVERY DAY AT BEDTIME, Disp: 90 tablet, Rfl: 3 .  sitaGLIPtin (JANUVIA) 100 MG tablet, Take 1 tablet (100 mg total) by mouth daily., Disp: 90 tablet, Rfl: 3 .  blood glucose meter kit and supplies, Accuchek or whatever is covered by insurance; check sugars once a day only if desired; Dx E11.65, LON 99 months (Patient not taking: No sig reported), Disp: 1 each, Rfl: 0 .  glucose blood test strip, Use as instructed to check sugars once a day if desired; LON 99 months, Dx E11.65 (Patient not taking: No sig reported), Disp: 100 each, Rfl: 3 .  Morphine Sulfate (MORPHINE CONCENTRATE) 10 mg / 0.5 ml concentrated solution, Take 0.25 mLs (5 mg total) by mouth every 4 (four) hours as needed for severe pain., Disp: 30  mL, Rfl: 0   Physical exam:  Vitals:   08/19/20 1106  BP: 127/79  Pulse: 73  Resp: 16  Temp: (!) 96.3 F (35.7 C)  TempSrc: Tympanic  SpO2: 100%  Weight: 123 lb 1.6 oz (55.8 kg)   Physical Exam Constitutional:      Comments: Thin elderly gentleman who appears in no acute distress  HENT:     Mouth/Throat:     Mouth: Mucous membranes are moist.     Pharynx: Oropharynx is clear.  Eyes:     Extraocular Movements: EOM normal.  Cardiovascular:     Rate and Rhythm: Normal rate and regular rhythm.     Heart sounds: Normal heart sounds.  Pulmonary:     Effort: Pulmonary effort is normal.     Breath sounds: Normal breath sounds.  Abdominal:     General: Bowel sounds are normal.     Palpations: Abdomen is soft.  Lymphadenopathy:     Comments: Bilateral palpable cervical adenopathy. R>L  Skin:    General: Skin is warm and dry.  Neurological:     Mental Status: He is alert and oriented to person, place,  and time.        CMP Latest Ref Rng & Units 08/19/2020  Glucose 70 - 99 mg/dL 114(H)  BUN 8 - 23 mg/dL 12  Creatinine 0.61 - 1.24 mg/dL 0.99  Sodium 135 - 145 mmol/L 136  Potassium 3.5 - 5.1 mmol/L 3.2(L)  Chloride 98 - 111 mmol/L 97(L)  CO2 22 - 32 mmol/L 27  Calcium 8.9 - 10.3 mg/dL 9.1  Total Protein 6.5 - 8.1 g/dL 7.0  Total Bilirubin 0.3 - 1.2 mg/dL 0.6  Alkaline Phos 38 - 126 U/L 41  AST 15 - 41 U/L 31  ALT 0 - 44 U/L 26   CBC Latest Ref Rng & Units 08/19/2020  WBC 4.0 - 10.5 K/uL 10.7(H)  Hemoglobin 13.0 - 17.0 g/dL 13.2  Hematocrit 39.0 - 52.0 % 38.1(L)  Platelets 150 - 400 K/uL 340    No images are attached to the encounter.  CT SOFT TISSUE NECK W CONTRAST  Result Date: 07/29/2020 CLINICAL DATA:  Lump in neck. Additional history provided: 60 pound weight loss in 3-4 months, right neck mass for 1 week, throat pain and difficulty swallowing for 3-4 months. EXAM: CT NECK WITH CONTRAST TECHNIQUE: Multidetector CT imaging of the neck was performed using the standard protocol following the bolus administration of intravenous contrast. CONTRAST:  21m OMNIPAQUE IOHEXOL 300 MG/ML  SOLN COMPARISON:  Chest CT 03/20/2020. FINDINGS: Pharynx and larynx: The patient is edentulous. No appreciable swelling or discrete mass within the nasopharynx or oral cavity. Irregular mucosal/submucosal mass involving the majority of the larynx bilaterally, extending from the epiglottis to the vocal cords. The mass extends into the pre-epiglottic and paraglottic spaces bilaterally. The mass extends along both aryepiglottic folds. There is invasion into, and possibly through, the thyroid cartilage bilaterally (for instance as seen on series 2, image 67). The mass measures 3.1 x 4.3 x 3.5 cm in greatest (AP x TV x CC) dimensions. Mild narrowing of the laryngeal airway. Salivary glands: No inflammation, mass or stone. The right submandibular gland is anteriorly displaced by lymphadenopathy. Thyroid:  Unremarkable. Lymph nodes: Enlarged and/or cystic/necrotic lymph nodes on the right at levels 2, 3 and 4 and on the left at levels 2 and 3. Additionally, there are right level 5 lymph nodes which are subcentimeter but asymmetrically prominent and suspicious. An index right level 2 necrotic  node/nodal conglomerate measures 4.5 x 3.1 cm and demonstrates ill-defined margins suspicious for extracapsular extension. Vascular: Lymphadenopathy completely effaces the mid right internal jugular vein. The right internal jugular vein is patent above and below. Atherosclerotic plaque within the visualized aortic arch, proximal major branch vessels of the neck and carotid arteries. Suspected greater than 70% stenosis of the proximal right internal carotid artery. Limited intracranial: No acute intracranial abnormality is identified. Visualized orbits: No mass or acute finding. Mastoids and visualized paranasal sinuses: Mild ethmoid sinus mucosal thickening. No significant mastoid effusion. Skeleton: No acute bony abnormality or aggressive osseous lesion. Upper chest: No consolidation within the imaged lung apices. These results will be called to the ordering clinician or representative by the Radiologist Assistant, and communication documented in the PACS or [employer]. IMPRESSION: Large mucosal/submucosal mass (3.1 x 4.3 x 3.5 cm) involving the majority of the larynx bilaterally, extending from the epiglottis to the vocal cords. Findings almost certainly reflect a laryngeal carcinoma. The mass extends into the pre-epiglottic and paraglottic spaces bilaterally. There is also invasion into, and possibly through, the thyroid cartilage bilaterally. Mild narrowing of the laryngeal airway. Lymphadenopathy within the right neck at stations 2, 3, 4 and possibly 5. Lymphadenopathy within the left neck at stations 2 and 3. An index right level 2 necrotic node/nodal conglomerate measures 4.5 x 3.1 cm and demonstrates ill-defined  margins suspicious for extracapsular extension. Right level 2 lymphadenopathy completely effaces the mid right internal jugular vein. The right IJ is patent above and below. Atherosclerotic disease with suspected greater than 70% stenosis of the proximal right ICA. Carotid artery duplex or CTA is recommended for further evaluation. Electronically Signed   By: Kellie Simmering DO   On: 07/29/2020 10:12   DG Carlena Hurl OP MEDICARE SPEECH PATH  Result Date: 08/13/2020 CLINICAL DATA:  Laryngeal malignancy.  Evaluate swallow function. EXAM: MODIFIED BARIUM SWALLOW TECHNIQUE: Different consistencies of barium were administered orally to the patient by the Speech Pathologist. Imaging of the pharynx was performed in the lateral projection. The radiologist was present in the fluoroscopy room for this study, providing personal supervision. FLUOROSCOPY TIME:  Fluoroscopy Time:  2.6 minute Radiation Exposure Index (if provided by the fluoroscopic device): 3.8 mGy Number of Acquired Spot Images: 0 COMPARISON:  None. FINDINGS: Real-time fluoroscopy of the swallowing function was performed with a speech pathologist present. Multiple consistencies of barium were administered which included water, nectar, applesauce, and Graham cracker. Delayed oropharyngeal transfer. No obstructing lesion. Laryngeal penetration with trace tracheal aspiration with thin liquids which persisted when looking right and left, but improved with chin-tuck maneuver. Similar appearance was seen with nectar thick liquids. No frank laryngeal penetration or aspiration with applesauce or cracker consistencies. IMPRESSION: Modified barium swallow as detailed above. Please refer to the Speech Pathologists report for complete details and recommendations. Objective Swallowing Evaluation: Type of Study: MBS-Modified Barium Swallow Study  Patient Details Name: Curley Hogen MRN: [id number] Date of Birth: 11/08/1940 Today's Date: 08/13/2020 Time:11:15-11:48 No data  recorded Past Medical History: Past Medical History: Diagnosis Date . Allergy  . Diabetes mellitus without complication ([place])  . Elevated CPK 11/18/2015 . Hyperlipidemia  . Hypertension  . Tobacco abuse  Past Surgical History: Past Surgical History: Procedure Laterality Date . FOOT SURGERY   HPI: Patient is an 80 y.o. male referred by Dr. Pryor Ochoa for MBS after R epiglottic mass identified on laryngoscopy on 07/24/20. CT Neck 07/29/20 revealed large mucosal/submucosal mass 3.1 x 4.3 x3.5cm involving the majority of the larynx bilaterally, extending  from the epiglottis to the vocal cords. 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 PET scan just prior to today's MBS confirms laryngeal mass and associated nodal disease bilateral neck and upper chest. Treatment plan pending. Patient reports 4 month history of dysphagia with 60 lb weight loss, hoarseness, spitting up blood. Reports "strangling" with liquids.  Subjective: Pt is hungry, eager to complete test so he can eat Assessment / Plan / Recommendation CHL IP CLINICAL IMPRESSIONS 08/13/2020 Clinical Impression Patient presents with mild-moderate oropharyngeal and mild pharyngoesophageal dysphagia. Mildly reduced oral control with intermittent premature spillage of liquids to the valleculae. Swallow initiation is timely at the level of the base of tongue and occasionally valleculae, however incomplete epiglottic deflection/airway closure results in laryngeal penetration with thin and nectar thick liquids during the swallow, some of which is eventually aspirated silently. He is able to eject some penetration and shallower aspiration with cued throat clear or cough. There is no laryngeal penetration or aspiration of puree or mechanical soft solid. Mildly reduced base of tongue retraction, resulting in mild base of tongue, valleculae and pyriform sinus residue with liquids, which reduces with cued and at times pt's own reflexive dry swallow.  Amplitude and duration of cricopharyngeus opening appears within normal limits. Appears to have thickened tissue around the cervical esophagus. Intermittent mild residue/retention in the cervical esophagus just below the cricopharyngeus with all consistencies; reduces with subsequent swallow; consider esophageal assessment. Several compensatory maneuvers were attempted; including L and R head turns, which were not effective. Chin tuck was somewhat effective with thin liquids; prevented penetration in one attempt, and reduced amount of penetration in a second trial. This strategy, combined with cough/throat clear after liquids was reviewed with patient and his son after the assessment. Advise continue soft diet with thin liquids using chin tuck and throat clear/cough, multiple dry swallows with thin liquids. Patient would benefit from referral for outpatient speech therapy for management of dysphagia and any treatment-related effects on swallow function. Recommend meds whole in puree; crush larger pills as pt reports difficulty with these. SLP Visit Diagnosis Dysphagia, oropharyngeal phase (R13.12);Dysphagia, pharyngoesophageal phase (R13.14) Attention and concentration deficit following -- Frontal lobe and executive function deficit following -- Impact on safety and function Mild aspiration risk;Moderate aspiration risk   CHL IP TREATMENT RECOMMENDATION 08/13/2020 Treatment Recommendations Defer treatment plan to f/u with SLP   No flowsheet data found. CHL IP DIET RECOMMENDATION 08/13/2020 SLP Diet Recommendations Regular solids;Dysphagia 3 (Mech soft) solids;Thin liquid Liquid Administration via Cup Medication Administration Whole meds with puree Compensations Slow rate;Small sips/bites;Multiple dry swallows after each bite/sip;Effortful swallow;Chin tuck;Clear throat after each swallow Postural Changes Seated upright at 90 degrees   CHL IP OTHER RECOMMENDATIONS 08/13/2020 Recommended Consults Consider esophageal  assessment Oral Care Recommendations Oral care before and after PO Other Recommendations --   CHL IP FOLLOW UP RECOMMENDATIONS 08/13/2020 Follow up Recommendations Outpatient SLP   No flowsheet data found.     CHL IP ORAL PHASE 08/13/2020 Oral Phase Impaired Oral - Pudding Teaspoon -- Oral - Pudding Cup -- Oral - Honey Teaspoon -- Oral - Honey Cup -- Oral - Nectar Teaspoon -- Oral - Nectar Cup Premature spillage Oral - Nectar Straw -- Oral - Thin Teaspoon -- Oral - Thin Cup Premature spillage Oral - Thin Straw -- Oral - Puree -- Oral - Mech Soft -- Oral - Regular -- Oral - Multi-Consistency -- Oral - Pill -- Oral Phase - Comment --  CHL IP PHARYNGEAL PHASE 08/13/2020 Pharyngeal Phase  Impaired Pharyngeal- Pudding Teaspoon -- Pharyngeal -- Pharyngeal- Pudding Cup -- Pharyngeal -- Pharyngeal- Honey Teaspoon -- Pharyngeal -- Pharyngeal- Honey Cup -- Pharyngeal -- Pharyngeal- Nectar Teaspoon -- Pharyngeal -- Pharyngeal- Nectar Cup Delayed swallow initiation-vallecula;Reduced epiglottic inversion;Reduced laryngeal elevation;Reduced airway/laryngeal closure;Reduced tongue base retraction;Penetration/Aspiration during swallow;Penetration/Apiration after swallow;Moderate aspiration;Pharyngeal residue - valleculae;Pharyngeal residue - pyriform;Pharyngeal residue - cp segment Pharyngeal Material enters airway, passes BELOW cords without attempt by patient to eject out (silent aspiration) Pharyngeal- Nectar Straw -- Pharyngeal -- Pharyngeal- Thin Teaspoon -- Pharyngeal -- Pharyngeal- Thin Cup Reduced epiglottic inversion;Reduced laryngeal elevation;Reduced airway/laryngeal closure;Reduced tongue base retraction;Penetration/Aspiration during swallow;Penetration/Apiration after swallow;Trace aspiration;Pharyngeal residue - valleculae;Pharyngeal residue - pyriform;Pharyngeal residue - cp segment Pharyngeal Material does not enter airway;Material enters airway, remains ABOVE vocal cords and not ejected out;Material enters airway,  CONTACTS cords and not ejected out;Material enters airway, passes BELOW cords without attempt by patient to eject out (silent aspiration) Pharyngeal- Thin Straw -- Pharyngeal -- Pharyngeal- Puree Reduced laryngeal elevation;Reduced epiglottic inversion;Pharyngeal residue - cp segment Pharyngeal Material does not enter airway Pharyngeal- Mechanical Soft Reduced epiglottic inversion;Reduced laryngeal elevation;Pharyngeal residue - cp segment Pharyngeal -- Pharyngeal- Regular -- Pharyngeal -- Pharyngeal- Multi-consistency -- Pharyngeal -- Pharyngeal- Pill -- Pharyngeal -- Pharyngeal Comment --  CHL IP CERVICAL ESOPHAGEAL PHASE 08/13/2020 Cervical Esophageal Phase Impaired Pudding Teaspoon -- Pudding Cup -- Honey Teaspoon -- Honey Cup -- Nectar Teaspoon -- Nectar Cup Other (Comment) Nectar Straw -- Thin Teaspoon -- Thin Cup Other (Comment) Thin Straw -- Puree Other (Comment) Mechanical Soft Other (Comment) Regular -- Multi-consistency -- Pill -- Cervical Esophageal Comment -- Deneise Lever, MS, CCC-SLP Speech-Language Pathologist Aliene Altes 08/13/2020, 5:32 PM            Electronically Signed   By: Kathreen Devoid   On: 08/13/2020 11:57   NM PET Image Initial (PI) Skull Base To Thigh  Result Date: 08/13/2020 CLINICAL DATA:  Initial treatment strategy for head and neck cancer in this 80 year old male. EXAM: NUCLEAR MEDICINE PET SKULL BASE TO THIGH TECHNIQUE: 6.73 mCi F-18 FDG was injected intravenously. Full-ring PET imaging was performed from the skull base to thigh after the radiotracer. CT data was obtained and used for attenuation correction and anatomic localization. Fasting blood glucose: 74 mg/dl COMPARISON:  CT of the neck from July 29, 2020 FINDINGS: Mediastinal blood pool activity: SUV max 1.83 Liver activity: SUV max NA NECK: Bilateral laryngeal mass as seen on the recent CT of the neck shows marked hypermetabolic change with FDG uptake. Maximum SUV of 78.4 Hypermetabolic lymph nodes in the bilateral  neck extending from level II through level V and into the chest. (Image 40 of series 3) bulky nodal disease in the RIGHT neck at level 2 measuring approximately 2.9 x 1.8 cm showing a maximum SUV of 9.5 Small lymph nodes with increased metabolism tracking caudal to this area in the RIGHT neck with a RIGHT level 4 lymph node adjacent to the thyroid on image 224 of series 6 at the thoracic inlet measuring 11 mm with a maximum SUV of 6.0. Dominant area of nodal disease in the LEFT neck with a maximum SUV of 4.3, a cluster of small lymph nodes which in aggregate measures approximately 3.1 cm greatest axial dimension, each of these lymph nodes with short axis dimension ranging from 6-8 mm. Small lymph nodes tracking caudal from this area to level 4 on the LEFT at image 54 of series 3 where there is an irregular lymph node that measures 1 cm but does  not show the degree of uptake seen in surrounding lymph nodes. A small lymph node just anterior to the jugular vein at this level measuring 7 mm (image 53, series 3) maximum SUV approximately 3.8 small lymph nodes along the posterior margin of the sternocleidomastoid bilaterally with slightly more uptake on the RIGHT as compared to the LEFT. Maximum SUV of level 5 lymph nodes of 2.1, nonspecific. See below for extension into the chest. Incidental CT findings: none CHEST: RIGHT paratracheal lymph node with increased metabolic activity (image 91 of series 3) maximum SUV of 7.5 and 1.7 cm short axis measurement. Rounded lymph node just above this tracking towards the thoracic inlet along the RIGHT paratracheal chain measuring 8 mm with a maximum SUV of 4.3. Subtle soft tissue density in the anterior mediastinum anterior to brachiocephalic confluence shows mildly increased metabolic activity with a maximum SUV of approximately 4.6 on image 86 of series 3 perhaps a small lymph node in this location measuring approximately 6 mm short axis. In the posterior neck just below the os a  put is a small 6 mm lymph node with mild increased metabolic activity with a max SUV of 2.8. Incidental CT findings: Lungs are clear. No consolidation. No pleural effusion. Airways are patent. Calcified atheromatous plaque in the thoracic aorta. Three-vessel coronary artery disease. No aneurysmal dilation. Caliber central pulmonary vessels is normal. Limited assessment of cardiovascular structures given lack of intravenous contrast. ABDOMEN/PELVIS: No abnormal hypermetabolic activity within the liver, pancreas, adrenal glands, or spleen. No hypermetabolic lymph nodes in the abdomen or pelvis. Incidental CT findings: No focal, suspicious hepatic abnormality on PET or CT images obtained without contrast. No pericholecystic stranding. Spleen normal size. No peripancreatic stranding. Mild thickening of the bilateral adrenal glands without associated increased metabolic activity and with low-density compatible with either mild hyperplasia or small adenoma particularly on the RIGHT. No hydronephrosis. No acute gastrointestinal process. Segmental uptake in the gastric antrum. Focal uptake in the RIGHT hemicolon without CT correlate on image 179 of series 3 no surrounding stranding. Calcified atheromatous plaque of the abdominal aorta without aneurysmal dilation. SKELETON: No focal hypermetabolic activity to suggest skeletal metastasis. Incidental CT findings: Chronic sternal fracture with nonunion. Spinal degenerative changes without acute or destructive bone finding. IMPRESSION: Laryngeal mass with bilateral involvement extending from the epiglottis to the vocal cords, associated with nodal disease in the bilateral neck and upper chest. Segmental uptake in the gastric antrum favored to represent physiologic changes and or gastritis. Focal uptake in the ascending colon more pronounced than elsewhere in the colon with question of focal thickening in the ascending colon below the a hepatic flexure. Early colonic neoplasm is  considered given that there is not a discrete mass lesion. Consider Cologuard and or colonoscopy as warranted for further assessment. Correlate with any recent screening history if available for risk factors an additional information. Aortic atherosclerosis. Chronic sternal fracture with nonunion. These results will be called to the ordering clinician or representative by the Radiologist Assistant, and communication documented in the PACS or [employer]. Aortic Atherosclerosis (ICD10-I70.0). Electronically Signed   By: Zetta Bills M.D.   On: 08/13/2020 12:42   [place] CORE BIOPSY (LYMPH NODES)  Result Date: 08/07/2020 INDICATION: Imaging findings concerning for head and neck malignancy with metastatic nodes. EXAM: ULTRASOUND FNA RIGHT NECK NODAL MASS ULTRASOUND CORE BIOPSY RIGHT NECK NODAL MASS MEDICATIONS: 1% lidocaine local ANESTHESIA/SEDATION: Moderate Sedation Time: None. The patient's level of consciousness and vital signs were monitored continuously by radiology nursing throughout the  procedure under my direct supervision. FLUOROSCOPY TIME:  Fluoroscopy Time: None. COMPLICATIONS: None immediate. PROCEDURE: Informed written consent was obtained from the patient after a thorough discussion of the procedural risks, benefits and alternatives. All questions were addressed. Maximal Sterile Barrier Technique was utilized including caps, mask, sterile gowns, sterile gloves, sterile drape, hand hygiene and skin antiseptic. A timeout was performed prior to the initiation of the procedure. Previous imaging reviewed. Preliminary ultrasound performed. The right neck submandibular nodal mass was localized and marked. This was correlated with the CT from 07/29/2020. Ultrasound fine-needle aspiration: Under sterile conditions and local anesthesia, 25 gauge needles were utilized performed fine-needle aspiration of the lesion. This was evaluated by cytology. Some of the samples were scant in cellularity. Suspicious  cells for malignancy were identified. Ultrasound core biopsy: Also under sterile conditions and local anesthesia, an 18 gauge core biopsy needle was advanced to the right neck nodal mass. 4 18 gauge core biopsies obtained. These were placed on a saline moistened Telfa pad. Postprocedure imaging demonstrates no hemorrhage or hematoma. Patient tolerated biopsy well. IMPRESSION: Successful right neck nodal mass FNA and core biopsies as above. Electronically Signed   By: Jerilynn Mages.  Shick M.D.   On: 08/07/2020 14:43   [place] FNA SOFT TISSUE  Result Date: 08/07/2020 INDICATION: Imaging findings concerning for head and neck malignancy with metastatic nodes. EXAM: ULTRASOUND FNA RIGHT NECK NODAL MASS ULTRASOUND CORE BIOPSY RIGHT NECK NODAL MASS MEDICATIONS: 1% lidocaine local ANESTHESIA/SEDATION: Moderate Sedation Time: None. The patient's level of consciousness and vital signs were monitored continuously by radiology nursing throughout the procedure under my direct supervision. FLUOROSCOPY TIME:  Fluoroscopy Time: None. COMPLICATIONS: None immediate. PROCEDURE: Informed written consent was obtained from the patient after a thorough discussion of the procedural risks, benefits and alternatives. All questions were addressed. Maximal Sterile Barrier Technique was utilized including caps, mask, sterile gowns, sterile gloves, sterile drape, hand hygiene and skin antiseptic. A timeout was performed prior to the initiation of the procedure. Previous imaging reviewed. Preliminary ultrasound performed. The right neck submandibular nodal mass was localized and marked. This was correlated with the CT from 07/29/2020. Ultrasound fine-needle aspiration: Under sterile conditions and local anesthesia, 25 gauge needles were utilized performed fine-needle aspiration of the lesion. This was evaluated by cytology. Some of the samples were scant in cellularity. Suspicious cells for malignancy were identified. Ultrasound core biopsy: Also under  sterile conditions and local anesthesia, an 18 gauge core biopsy needle was advanced to the right neck nodal mass. 4 18 gauge core biopsies obtained. These were placed on a saline moistened Telfa pad. Postprocedure imaging demonstrates no hemorrhage or hematoma. Patient tolerated biopsy well. IMPRESSION: Successful right neck nodal mass FNA and core biopsies as above. Electronically Signed   By: Jerilynn Mages.  Shick M.D.   On: 08/07/2020 14:43    Assessment and plan- Patient is a 80 y.o. 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  I have reviewed PET CT scan images independently  Patient has very advanced laryngeal cancer given that he has bilateral laryngeal as well as cervical adenopathy With tumor extending up to the level of pretracheal lymph node but was also hypermetabolic on the PET scan.  This would be at least a T4bN2 disease.  Patient is elderly and has lost significant weight in the recent  past due to his sore throat and difficulty swallowing.  I doubt that he would be a surgical candidate but he will be seeing Dr. Frazier Butt from [facility] to discuss this further tomorrow and I have encouraged the patient and his son to keep this appointment with them.  I have also discussed his case with Dr. Pryor Ochoa over the phone today.  At this time we are looking at the following 3 possibilities:  1.  If [facility] says that he is a surgical candidate then he may have to receive some neoadjuvant induction chemotherapy to downsize the tumor before surgery.  2. if he is not a surgical candidate then he would benefit from concurrent chemoradiation and we will discuss his case at tumor board to if he would need induction chemotherapy prior to it.  Patient will need a PEG tube in place before we start any treatment given that he  is elderly and has had significant weight loss in the recent past.  However putting him through any kind of anesthesia could be a potential risk given the laryngeal involvement and possible edema if his airway is manipulated.  Dr.Vaught feels that patient would need a prophylactic tracheostomy if concurrent chemoradiation is planned to prevent laryngeal edema from closing off his airway during radiation treatment  It would likely be best if patient can get a tracheostomy PEG and port placement either at the same time or a PEG and for after a tracheostomy and his airway is secured.  Discussed risks and benefits of weekly cisplatin including all but not limited to nausea, vomiting, low blood counts, risk of infections and hospitalizations.  Risk of peripheral neuropathy, AKI and hearing loss associated with cisplatin.  Treatment will be given with a palliative intent.  Patient understands and agrees to proceed as planned if concurrent chemoradiation is decided as the way to go.  I would recommend weekly cisplatin 40 mg per metered square at that time  We will call the patient with a definitive plan after we hear back from Dr. Frazier Butt at [facility]) Staging form: Larynx - Glottis, AJCC 8th Edition - Clinical stage from 08/19/2020: Stage IVB (cT4b, cN2c, cM0) - Signed by Sindy Guadeloupe, MD on 08/19/2020 '  Thank you for this kind referral and the opportunity to participate in the care of this  patient   Visit Diagnosis 1. Laryngeal cancer ([place])   2. Goals of care, counseling/discussion   3. Neoplasm related pain     Dr. Randa Evens, MD, MPH [facility] [phone] 08/19/2020

## 2020-08-20 DIAGNOSIS — C329 Malignant neoplasm of larynx, unspecified: Secondary | ICD-10-CM | POA: Diagnosis not present

## 2020-08-20 DIAGNOSIS — C7989 Secondary malignant neoplasm of other specified sites: Secondary | ICD-10-CM | POA: Diagnosis not present

## 2020-08-20 DIAGNOSIS — R499 Unspecified voice and resonance disorder: Secondary | ICD-10-CM | POA: Diagnosis not present

## 2020-08-21 ENCOUNTER — Other Ambulatory Visit: Payer: Medicare HMO

## 2020-08-21 NOTE — Progress Notes (Signed)
Tumor Board Documentation  Connor Aguilar was presented by Dr Janese Banks at our Tumor Board on 08/21/2020, which included representatives from medical oncology,radiation oncology,internal medicine,navigation,pathology,radiology,surgical,pharmacy,genetics,research,palliative care,pulmonology.  Connor Aguilar currently presents as a new patient,for new positive pathology,for [place] with history of the following treatments: surgical intervention(s).  Additionally, we reviewed previous medical and familial history, history of present illness, and recent lab results along with all available histopathologic and imaging studies. The tumor board considered available treatment options and made the following recommendations: Concurrent chemo-radiation therapy (Possible surgery) Will need a PEG tube and Tracheostomy  The following procedures/referrals were also placed: No orders of the defined types were placed in this encounter.   Clinical Trial Status: not discussed   Staging used: AJCC Stage Group  AJCC Staging: T: 4b N: 2 M: ? Group: Stage IV Squamous Cell Carcinoma of Larynx   National site-specific guidelines NCCN were discussed with respect to the case.  Tumor board is a meeting of clinicians from various specialty areas who evaluate and discuss patients for whom a multidisciplinary approach is being considered. Final determinations in the plan of care are those of the provider(s). The responsibility for follow up of recommendations given during tumor board is that of the provider.   Today's extended care, comprehensive team conference, Connor Aguilar was not present for the discussion and was not examined.   Multidisciplinary Tumor Board is a multidisciplinary case peer review process.  Decisions discussed in the Multidisciplinary Tumor Board reflect the opinions of the specialists present at the conference without having examined the patient.  Ultimately, treatment and diagnostic decisions rest with the primary  provider(s) and the patient.

## 2020-08-22 DIAGNOSIS — E119 Type 2 diabetes mellitus without complications: Secondary | ICD-10-CM | POA: Diagnosis not present

## 2020-08-22 DIAGNOSIS — I10 Essential (primary) hypertension: Secondary | ICD-10-CM | POA: Diagnosis not present

## 2020-08-22 DIAGNOSIS — Z720 Tobacco use: Secondary | ICD-10-CM | POA: Diagnosis not present

## 2020-08-22 DIAGNOSIS — C7989 Secondary malignant neoplasm of other specified sites: Secondary | ICD-10-CM | POA: Insufficient documentation

## 2020-08-22 DIAGNOSIS — C329 Malignant neoplasm of larynx, unspecified: Secondary | ICD-10-CM | POA: Diagnosis not present

## 2020-08-26 ENCOUNTER — Ambulatory Visit: Payer: Medicare HMO | Admitting: Family Medicine

## 2020-08-26 DIAGNOSIS — E119 Type 2 diabetes mellitus without complications: Secondary | ICD-10-CM | POA: Diagnosis not present

## 2020-08-26 DIAGNOSIS — R638 Other symptoms and signs concerning food and fluid intake: Secondary | ICD-10-CM | POA: Diagnosis not present

## 2020-08-26 DIAGNOSIS — E1165 Type 2 diabetes mellitus with hyperglycemia: Secondary | ICD-10-CM | POA: Diagnosis not present

## 2020-08-26 DIAGNOSIS — Z9002 Acquired absence of larynx: Secondary | ICD-10-CM | POA: Diagnosis not present

## 2020-08-26 DIAGNOSIS — Z7984 Long term (current) use of oral hypoglycemic drugs: Secondary | ICD-10-CM | POA: Diagnosis not present

## 2020-08-26 DIAGNOSIS — R590 Localized enlarged lymph nodes: Secondary | ICD-10-CM | POA: Diagnosis not present

## 2020-08-26 DIAGNOSIS — I361 Nonrheumatic tricuspid (valve) insufficiency: Secondary | ICD-10-CM | POA: Diagnosis not present

## 2020-08-26 DIAGNOSIS — F05 Delirium due to known physiological condition: Secondary | ICD-10-CM | POA: Diagnosis not present

## 2020-08-26 DIAGNOSIS — C4442 Squamous cell carcinoma of skin of scalp and neck: Secondary | ICD-10-CM | POA: Diagnosis not present

## 2020-08-26 DIAGNOSIS — Z452 Encounter for adjustment and management of vascular access device: Secondary | ICD-10-CM | POA: Diagnosis not present

## 2020-08-26 DIAGNOSIS — I9589 Other hypotension: Secondary | ICD-10-CM | POA: Diagnosis not present

## 2020-08-26 DIAGNOSIS — C771 Secondary and unspecified malignant neoplasm of intrathoracic lymph nodes: Secondary | ICD-10-CM | POA: Diagnosis not present

## 2020-08-26 DIAGNOSIS — J449 Chronic obstructive pulmonary disease, unspecified: Secondary | ICD-10-CM | POA: Diagnosis not present

## 2020-08-26 DIAGNOSIS — Z1211 Encounter for screening for malignant neoplasm of colon: Secondary | ICD-10-CM | POA: Diagnosis not present

## 2020-08-26 DIAGNOSIS — Z4682 Encounter for fitting and adjustment of non-vascular catheter: Secondary | ICD-10-CM | POA: Diagnosis not present

## 2020-08-26 DIAGNOSIS — F17210 Nicotine dependence, cigarettes, uncomplicated: Secondary | ICD-10-CM | POA: Diagnosis not present

## 2020-08-26 DIAGNOSIS — C321 Malignant neoplasm of supraglottis: Secondary | ICD-10-CM | POA: Diagnosis not present

## 2020-08-26 DIAGNOSIS — E118 Type 2 diabetes mellitus with unspecified complications: Secondary | ICD-10-CM | POA: Diagnosis not present

## 2020-08-26 DIAGNOSIS — I10 Essential (primary) hypertension: Secondary | ICD-10-CM | POA: Diagnosis not present

## 2020-08-26 DIAGNOSIS — C770 Secondary and unspecified malignant neoplasm of lymph nodes of head, face and neck: Secondary | ICD-10-CM | POA: Diagnosis not present

## 2020-08-26 DIAGNOSIS — C7989 Secondary malignant neoplasm of other specified sites: Secondary | ICD-10-CM | POA: Diagnosis not present

## 2020-08-26 DIAGNOSIS — Z430 Encounter for attention to tracheostomy: Secondary | ICD-10-CM | POA: Diagnosis not present

## 2020-08-26 DIAGNOSIS — C329 Malignant neoplasm of larynx, unspecified: Secondary | ICD-10-CM | POA: Diagnosis not present

## 2020-09-10 DIAGNOSIS — Z430 Encounter for attention to tracheostomy: Secondary | ICD-10-CM | POA: Diagnosis not present

## 2020-09-10 DIAGNOSIS — C321 Malignant neoplasm of supraglottis: Secondary | ICD-10-CM | POA: Diagnosis not present

## 2020-09-10 DIAGNOSIS — Z87891 Personal history of nicotine dependence: Secondary | ICD-10-CM | POA: Diagnosis not present

## 2020-09-10 DIAGNOSIS — Z9002 Acquired absence of larynx: Secondary | ICD-10-CM | POA: Diagnosis not present

## 2020-09-10 DIAGNOSIS — C7989 Secondary malignant neoplasm of other specified sites: Secondary | ICD-10-CM | POA: Diagnosis not present

## 2020-09-11 DIAGNOSIS — Z483 Aftercare following surgery for neoplasm: Secondary | ICD-10-CM | POA: Diagnosis not present

## 2020-09-11 DIAGNOSIS — Z9181 History of falling: Secondary | ICD-10-CM | POA: Diagnosis not present

## 2020-09-11 DIAGNOSIS — C7989 Secondary malignant neoplasm of other specified sites: Secondary | ICD-10-CM | POA: Diagnosis not present

## 2020-09-11 DIAGNOSIS — Z7982 Long term (current) use of aspirin: Secondary | ICD-10-CM | POA: Diagnosis not present

## 2020-09-11 DIAGNOSIS — Z87891 Personal history of nicotine dependence: Secondary | ICD-10-CM | POA: Diagnosis not present

## 2020-09-11 DIAGNOSIS — E119 Type 2 diabetes mellitus without complications: Secondary | ICD-10-CM | POA: Diagnosis not present

## 2020-09-11 DIAGNOSIS — I10 Essential (primary) hypertension: Secondary | ICD-10-CM | POA: Diagnosis not present

## 2020-09-12 DIAGNOSIS — Z430 Encounter for attention to tracheostomy: Secondary | ICD-10-CM | POA: Diagnosis not present

## 2020-09-15 ENCOUNTER — Other Ambulatory Visit: Payer: Self-pay | Admitting: Family Medicine

## 2020-09-15 DIAGNOSIS — R630 Anorexia: Secondary | ICD-10-CM

## 2020-09-15 NOTE — Telephone Encounter (Signed)
Attempted to call patient. Call answered and then call was dropped. Patient will need to schedule an appointment with PCP or contact GI specialist for refills on this medication. See previous refill encounter on 09/15/20.

## 2020-09-15 NOTE — Telephone Encounter (Signed)
Called to schedule appt and they said that Connor Aguilar had just had surgery on his throat, but went ahead and scheduled for 09-22-2020 and will call back if that date does not work. Could we refill this due to his surgery

## 2020-09-15 NOTE — Telephone Encounter (Signed)
Medication Refill - Medication: pantoprazole (PROTONIX) 40 MG tablet   Has the patient contacted their pharmacy? Yes.  Contact PCP.  Preferred Pharmacy (with phone number or street name):  [place], [place] - [address]  [place] [address]  Phone: [phone] Fax: [phone]   Agent: Please be advised that RX refills may take up to 3 business days. We ask that you follow-up with your pharmacy.

## 2020-09-16 DIAGNOSIS — E119 Type 2 diabetes mellitus without complications: Secondary | ICD-10-CM | POA: Diagnosis not present

## 2020-09-16 DIAGNOSIS — C7989 Secondary malignant neoplasm of other specified sites: Secondary | ICD-10-CM | POA: Diagnosis not present

## 2020-09-16 DIAGNOSIS — Z483 Aftercare following surgery for neoplasm: Secondary | ICD-10-CM | POA: Diagnosis not present

## 2020-09-16 DIAGNOSIS — Z87891 Personal history of nicotine dependence: Secondary | ICD-10-CM | POA: Diagnosis not present

## 2020-09-16 DIAGNOSIS — Z9181 History of falling: Secondary | ICD-10-CM | POA: Diagnosis not present

## 2020-09-16 DIAGNOSIS — I10 Essential (primary) hypertension: Secondary | ICD-10-CM | POA: Diagnosis not present

## 2020-09-16 DIAGNOSIS — Z7982 Long term (current) use of aspirin: Secondary | ICD-10-CM | POA: Diagnosis not present

## 2020-09-16 NOTE — Telephone Encounter (Signed)
Called pt/niece told them he needs to get this refill by specialist since just had surgery for throat cancer

## 2020-09-17 DIAGNOSIS — Z430 Encounter for attention to tracheostomy: Secondary | ICD-10-CM | POA: Diagnosis not present

## 2020-09-17 DIAGNOSIS — Z483 Aftercare following surgery for neoplasm: Secondary | ICD-10-CM | POA: Diagnosis not present

## 2020-09-17 DIAGNOSIS — I10 Essential (primary) hypertension: Secondary | ICD-10-CM | POA: Diagnosis not present

## 2020-09-17 DIAGNOSIS — Z794 Long term (current) use of insulin: Secondary | ICD-10-CM | POA: Diagnosis not present

## 2020-09-17 DIAGNOSIS — Z448 Encounter for fitting and adjustment of other external prosthetic devices: Secondary | ICD-10-CM | POA: Diagnosis not present

## 2020-09-17 DIAGNOSIS — Z7982 Long term (current) use of aspirin: Secondary | ICD-10-CM | POA: Diagnosis not present

## 2020-09-17 DIAGNOSIS — Z9181 History of falling: Secondary | ICD-10-CM | POA: Diagnosis not present

## 2020-09-17 DIAGNOSIS — E119 Type 2 diabetes mellitus without complications: Secondary | ICD-10-CM | POA: Diagnosis not present

## 2020-09-17 DIAGNOSIS — K219 Gastro-esophageal reflux disease without esophagitis: Secondary | ICD-10-CM | POA: Diagnosis not present

## 2020-09-17 DIAGNOSIS — Z87891 Personal history of nicotine dependence: Secondary | ICD-10-CM | POA: Diagnosis not present

## 2020-09-17 DIAGNOSIS — C7989 Secondary malignant neoplasm of other specified sites: Secondary | ICD-10-CM | POA: Diagnosis not present

## 2020-09-17 DIAGNOSIS — E079 Disorder of thyroid, unspecified: Secondary | ICD-10-CM | POA: Diagnosis not present

## 2020-09-18 ENCOUNTER — Telehealth: Payer: Self-pay

## 2020-09-18 DIAGNOSIS — C7989 Secondary malignant neoplasm of other specified sites: Secondary | ICD-10-CM | POA: Diagnosis not present

## 2020-09-18 DIAGNOSIS — Z9181 History of falling: Secondary | ICD-10-CM | POA: Diagnosis not present

## 2020-09-18 DIAGNOSIS — Z7982 Long term (current) use of aspirin: Secondary | ICD-10-CM | POA: Diagnosis not present

## 2020-09-18 DIAGNOSIS — Z483 Aftercare following surgery for neoplasm: Secondary | ICD-10-CM | POA: Diagnosis not present

## 2020-09-18 DIAGNOSIS — E119 Type 2 diabetes mellitus without complications: Secondary | ICD-10-CM | POA: Diagnosis not present

## 2020-09-18 DIAGNOSIS — I10 Essential (primary) hypertension: Secondary | ICD-10-CM | POA: Diagnosis not present

## 2020-09-18 DIAGNOSIS — Z87891 Personal history of nicotine dependence: Secondary | ICD-10-CM | POA: Diagnosis not present

## 2020-09-22 ENCOUNTER — Ambulatory Visit: Payer: Medicare HMO | Admitting: Family Medicine

## 2020-09-22 DIAGNOSIS — Z9181 History of falling: Secondary | ICD-10-CM | POA: Diagnosis not present

## 2020-09-22 DIAGNOSIS — E119 Type 2 diabetes mellitus without complications: Secondary | ICD-10-CM | POA: Diagnosis not present

## 2020-09-22 DIAGNOSIS — I10 Essential (primary) hypertension: Secondary | ICD-10-CM | POA: Diagnosis not present

## 2020-09-22 DIAGNOSIS — Z7982 Long term (current) use of aspirin: Secondary | ICD-10-CM | POA: Diagnosis not present

## 2020-09-22 DIAGNOSIS — Z87891 Personal history of nicotine dependence: Secondary | ICD-10-CM | POA: Diagnosis not present

## 2020-09-22 DIAGNOSIS — C7989 Secondary malignant neoplasm of other specified sites: Secondary | ICD-10-CM | POA: Diagnosis not present

## 2020-09-22 DIAGNOSIS — Z483 Aftercare following surgery for neoplasm: Secondary | ICD-10-CM | POA: Diagnosis not present

## 2020-09-23 DIAGNOSIS — C329 Malignant neoplasm of larynx, unspecified: Secondary | ICD-10-CM | POA: Diagnosis not present

## 2020-09-24 DIAGNOSIS — Z9181 History of falling: Secondary | ICD-10-CM | POA: Diagnosis not present

## 2020-09-24 DIAGNOSIS — Z87891 Personal history of nicotine dependence: Secondary | ICD-10-CM | POA: Diagnosis not present

## 2020-09-24 DIAGNOSIS — C7989 Secondary malignant neoplasm of other specified sites: Secondary | ICD-10-CM | POA: Diagnosis not present

## 2020-09-24 DIAGNOSIS — E119 Type 2 diabetes mellitus without complications: Secondary | ICD-10-CM | POA: Diagnosis not present

## 2020-09-24 DIAGNOSIS — I10 Essential (primary) hypertension: Secondary | ICD-10-CM | POA: Diagnosis not present

## 2020-09-24 DIAGNOSIS — Z483 Aftercare following surgery for neoplasm: Secondary | ICD-10-CM | POA: Diagnosis not present

## 2020-09-24 DIAGNOSIS — Z7982 Long term (current) use of aspirin: Secondary | ICD-10-CM | POA: Diagnosis not present

## 2020-09-25 DIAGNOSIS — Z87891 Personal history of nicotine dependence: Secondary | ICD-10-CM | POA: Diagnosis not present

## 2020-09-25 DIAGNOSIS — C7989 Secondary malignant neoplasm of other specified sites: Secondary | ICD-10-CM | POA: Diagnosis not present

## 2020-09-25 DIAGNOSIS — Z7982 Long term (current) use of aspirin: Secondary | ICD-10-CM | POA: Diagnosis not present

## 2020-09-25 DIAGNOSIS — E119 Type 2 diabetes mellitus without complications: Secondary | ICD-10-CM | POA: Diagnosis not present

## 2020-09-25 DIAGNOSIS — Z483 Aftercare following surgery for neoplasm: Secondary | ICD-10-CM | POA: Diagnosis not present

## 2020-09-25 DIAGNOSIS — Z9181 History of falling: Secondary | ICD-10-CM | POA: Diagnosis not present

## 2020-09-25 DIAGNOSIS — I10 Essential (primary) hypertension: Secondary | ICD-10-CM | POA: Diagnosis not present

## 2020-09-26 DIAGNOSIS — Z8521 Personal history of malignant neoplasm of larynx: Secondary | ICD-10-CM | POA: Diagnosis not present

## 2020-09-26 DIAGNOSIS — Z08 Encounter for follow-up examination after completed treatment for malignant neoplasm: Secondary | ICD-10-CM | POA: Diagnosis not present

## 2020-09-26 DIAGNOSIS — Z963 Presence of artificial larynx: Secondary | ICD-10-CM | POA: Diagnosis not present

## 2020-09-29 DIAGNOSIS — Z9181 History of falling: Secondary | ICD-10-CM | POA: Diagnosis not present

## 2020-09-29 DIAGNOSIS — C7989 Secondary malignant neoplasm of other specified sites: Secondary | ICD-10-CM | POA: Diagnosis not present

## 2020-09-29 DIAGNOSIS — Z7982 Long term (current) use of aspirin: Secondary | ICD-10-CM | POA: Diagnosis not present

## 2020-09-29 DIAGNOSIS — E119 Type 2 diabetes mellitus without complications: Secondary | ICD-10-CM | POA: Diagnosis not present

## 2020-09-29 DIAGNOSIS — Z483 Aftercare following surgery for neoplasm: Secondary | ICD-10-CM | POA: Diagnosis not present

## 2020-09-29 DIAGNOSIS — Z87891 Personal history of nicotine dependence: Secondary | ICD-10-CM | POA: Diagnosis not present

## 2020-09-29 DIAGNOSIS — I10 Essential (primary) hypertension: Secondary | ICD-10-CM | POA: Diagnosis not present

## 2020-09-30 ENCOUNTER — Other Ambulatory Visit: Payer: Self-pay | Admitting: Otolaryngology

## 2020-09-30 DIAGNOSIS — C7989 Secondary malignant neoplasm of other specified sites: Secondary | ICD-10-CM | POA: Diagnosis not present

## 2020-09-30 DIAGNOSIS — Z87891 Personal history of nicotine dependence: Secondary | ICD-10-CM | POA: Diagnosis not present

## 2020-09-30 DIAGNOSIS — Z510 Encounter for antineoplastic radiation therapy: Secondary | ICD-10-CM | POA: Diagnosis not present

## 2020-09-30 DIAGNOSIS — Z9002 Acquired absence of larynx: Secondary | ICD-10-CM | POA: Diagnosis not present

## 2020-09-30 DIAGNOSIS — C328 Malignant neoplasm of overlapping sites of larynx: Secondary | ICD-10-CM | POA: Diagnosis not present

## 2020-10-01 DIAGNOSIS — C7989 Secondary malignant neoplasm of other specified sites: Secondary | ICD-10-CM | POA: Diagnosis not present

## 2020-10-01 DIAGNOSIS — Z9181 History of falling: Secondary | ICD-10-CM | POA: Diagnosis not present

## 2020-10-01 DIAGNOSIS — E119 Type 2 diabetes mellitus without complications: Secondary | ICD-10-CM | POA: Diagnosis not present

## 2020-10-01 DIAGNOSIS — Z430 Encounter for attention to tracheostomy: Secondary | ICD-10-CM | POA: Diagnosis not present

## 2020-10-01 DIAGNOSIS — Z483 Aftercare following surgery for neoplasm: Secondary | ICD-10-CM | POA: Diagnosis not present

## 2020-10-01 DIAGNOSIS — Z7982 Long term (current) use of aspirin: Secondary | ICD-10-CM | POA: Diagnosis not present

## 2020-10-01 DIAGNOSIS — Z87891 Personal history of nicotine dependence: Secondary | ICD-10-CM | POA: Diagnosis not present

## 2020-10-01 DIAGNOSIS — I10 Essential (primary) hypertension: Secondary | ICD-10-CM | POA: Diagnosis not present

## 2020-10-02 DIAGNOSIS — Z430 Encounter for attention to tracheostomy: Secondary | ICD-10-CM | POA: Diagnosis not present

## 2020-10-02 DIAGNOSIS — C7989 Secondary malignant neoplasm of other specified sites: Secondary | ICD-10-CM | POA: Diagnosis not present

## 2020-10-03 DIAGNOSIS — C7989 Secondary malignant neoplasm of other specified sites: Secondary | ICD-10-CM | POA: Diagnosis not present

## 2020-10-03 DIAGNOSIS — Z9181 History of falling: Secondary | ICD-10-CM | POA: Diagnosis not present

## 2020-10-03 DIAGNOSIS — Z7982 Long term (current) use of aspirin: Secondary | ICD-10-CM | POA: Diagnosis not present

## 2020-10-03 DIAGNOSIS — Z87891 Personal history of nicotine dependence: Secondary | ICD-10-CM | POA: Diagnosis not present

## 2020-10-03 DIAGNOSIS — Z483 Aftercare following surgery for neoplasm: Secondary | ICD-10-CM | POA: Diagnosis not present

## 2020-10-03 DIAGNOSIS — Z430 Encounter for attention to tracheostomy: Secondary | ICD-10-CM | POA: Diagnosis not present

## 2020-10-03 DIAGNOSIS — E119 Type 2 diabetes mellitus without complications: Secondary | ICD-10-CM | POA: Diagnosis not present

## 2020-10-03 DIAGNOSIS — I10 Essential (primary) hypertension: Secondary | ICD-10-CM | POA: Diagnosis not present

## 2020-10-06 DIAGNOSIS — C7989 Secondary malignant neoplasm of other specified sites: Secondary | ICD-10-CM | POA: Diagnosis not present

## 2020-10-06 DIAGNOSIS — I10 Essential (primary) hypertension: Secondary | ICD-10-CM | POA: Diagnosis not present

## 2020-10-06 DIAGNOSIS — Z430 Encounter for attention to tracheostomy: Secondary | ICD-10-CM | POA: Diagnosis not present

## 2020-10-06 DIAGNOSIS — Z510 Encounter for antineoplastic radiation therapy: Secondary | ICD-10-CM | POA: Diagnosis not present

## 2020-10-06 DIAGNOSIS — Z9181 History of falling: Secondary | ICD-10-CM | POA: Diagnosis not present

## 2020-10-06 DIAGNOSIS — Z87891 Personal history of nicotine dependence: Secondary | ICD-10-CM | POA: Diagnosis not present

## 2020-10-06 DIAGNOSIS — C328 Malignant neoplasm of overlapping sites of larynx: Secondary | ICD-10-CM | POA: Diagnosis not present

## 2020-10-06 DIAGNOSIS — Z9002 Acquired absence of larynx: Secondary | ICD-10-CM | POA: Diagnosis not present

## 2020-10-06 DIAGNOSIS — E119 Type 2 diabetes mellitus without complications: Secondary | ICD-10-CM | POA: Diagnosis not present

## 2020-10-06 DIAGNOSIS — Z483 Aftercare following surgery for neoplasm: Secondary | ICD-10-CM | POA: Diagnosis not present

## 2020-10-06 DIAGNOSIS — Z7982 Long term (current) use of aspirin: Secondary | ICD-10-CM | POA: Diagnosis not present

## 2020-10-07 DIAGNOSIS — C7989 Secondary malignant neoplasm of other specified sites: Secondary | ICD-10-CM | POA: Diagnosis not present

## 2020-10-07 DIAGNOSIS — Z510 Encounter for antineoplastic radiation therapy: Secondary | ICD-10-CM | POA: Diagnosis not present

## 2020-10-07 DIAGNOSIS — C329 Malignant neoplasm of larynx, unspecified: Secondary | ICD-10-CM | POA: Diagnosis not present

## 2020-10-07 DIAGNOSIS — Z5112 Encounter for antineoplastic immunotherapy: Secondary | ICD-10-CM | POA: Diagnosis not present

## 2020-10-07 DIAGNOSIS — C760 Malignant neoplasm of head, face and neck: Secondary | ICD-10-CM | POA: Diagnosis not present

## 2020-10-07 DIAGNOSIS — E119 Type 2 diabetes mellitus without complications: Secondary | ICD-10-CM | POA: Diagnosis not present

## 2020-10-07 DIAGNOSIS — Z9181 History of falling: Secondary | ICD-10-CM | POA: Diagnosis not present

## 2020-10-07 DIAGNOSIS — Z7982 Long term (current) use of aspirin: Secondary | ICD-10-CM | POA: Diagnosis not present

## 2020-10-07 DIAGNOSIS — Z483 Aftercare following surgery for neoplasm: Secondary | ICD-10-CM | POA: Diagnosis not present

## 2020-10-07 DIAGNOSIS — Z87891 Personal history of nicotine dependence: Secondary | ICD-10-CM | POA: Diagnosis not present

## 2020-10-07 DIAGNOSIS — Z7984 Long term (current) use of oral hypoglycemic drugs: Secondary | ICD-10-CM | POA: Diagnosis not present

## 2020-10-07 DIAGNOSIS — I10 Essential (primary) hypertension: Secondary | ICD-10-CM | POA: Diagnosis not present

## 2020-10-07 DIAGNOSIS — C328 Malignant neoplasm of overlapping sites of larynx: Secondary | ICD-10-CM | POA: Diagnosis not present

## 2020-10-07 DIAGNOSIS — Z9002 Acquired absence of larynx: Secondary | ICD-10-CM | POA: Diagnosis not present

## 2020-10-08 DIAGNOSIS — C760 Malignant neoplasm of head, face and neck: Secondary | ICD-10-CM | POA: Diagnosis not present

## 2020-10-08 DIAGNOSIS — Z7982 Long term (current) use of aspirin: Secondary | ICD-10-CM | POA: Diagnosis not present

## 2020-10-08 DIAGNOSIS — Z510 Encounter for antineoplastic radiation therapy: Secondary | ICD-10-CM | POA: Diagnosis not present

## 2020-10-08 DIAGNOSIS — Z87891 Personal history of nicotine dependence: Secondary | ICD-10-CM | POA: Diagnosis not present

## 2020-10-08 DIAGNOSIS — Z7984 Long term (current) use of oral hypoglycemic drugs: Secondary | ICD-10-CM | POA: Diagnosis not present

## 2020-10-08 DIAGNOSIS — Z483 Aftercare following surgery for neoplasm: Secondary | ICD-10-CM | POA: Diagnosis not present

## 2020-10-08 DIAGNOSIS — I10 Essential (primary) hypertension: Secondary | ICD-10-CM | POA: Diagnosis not present

## 2020-10-08 DIAGNOSIS — C7989 Secondary malignant neoplasm of other specified sites: Secondary | ICD-10-CM | POA: Diagnosis not present

## 2020-10-08 DIAGNOSIS — Z9181 History of falling: Secondary | ICD-10-CM | POA: Diagnosis not present

## 2020-10-08 DIAGNOSIS — C329 Malignant neoplasm of larynx, unspecified: Secondary | ICD-10-CM | POA: Diagnosis not present

## 2020-10-08 DIAGNOSIS — Z9002 Acquired absence of larynx: Secondary | ICD-10-CM | POA: Diagnosis not present

## 2020-10-08 DIAGNOSIS — C328 Malignant neoplasm of overlapping sites of larynx: Secondary | ICD-10-CM | POA: Diagnosis not present

## 2020-10-08 DIAGNOSIS — E119 Type 2 diabetes mellitus without complications: Secondary | ICD-10-CM | POA: Diagnosis not present

## 2020-10-08 DIAGNOSIS — Z5112 Encounter for antineoplastic immunotherapy: Secondary | ICD-10-CM | POA: Diagnosis not present

## 2020-10-09 DIAGNOSIS — C760 Malignant neoplasm of head, face and neck: Secondary | ICD-10-CM | POA: Diagnosis not present

## 2020-10-09 DIAGNOSIS — C7989 Secondary malignant neoplasm of other specified sites: Secondary | ICD-10-CM | POA: Diagnosis not present

## 2020-10-09 DIAGNOSIS — Z5112 Encounter for antineoplastic immunotherapy: Secondary | ICD-10-CM | POA: Diagnosis not present

## 2020-10-09 DIAGNOSIS — E119 Type 2 diabetes mellitus without complications: Secondary | ICD-10-CM | POA: Diagnosis not present

## 2020-10-09 DIAGNOSIS — E876 Hypokalemia: Secondary | ICD-10-CM | POA: Insufficient documentation

## 2020-10-09 DIAGNOSIS — C329 Malignant neoplasm of larynx, unspecified: Secondary | ICD-10-CM | POA: Diagnosis not present

## 2020-10-09 DIAGNOSIS — Z7984 Long term (current) use of oral hypoglycemic drugs: Secondary | ICD-10-CM | POA: Diagnosis not present

## 2020-10-09 DIAGNOSIS — Z87891 Personal history of nicotine dependence: Secondary | ICD-10-CM | POA: Diagnosis not present

## 2020-10-09 DIAGNOSIS — Z510 Encounter for antineoplastic radiation therapy: Secondary | ICD-10-CM | POA: Diagnosis not present

## 2020-10-09 DIAGNOSIS — C328 Malignant neoplasm of overlapping sites of larynx: Secondary | ICD-10-CM | POA: Diagnosis not present

## 2020-10-09 DIAGNOSIS — Z9002 Acquired absence of larynx: Secondary | ICD-10-CM | POA: Diagnosis not present

## 2020-10-10 DIAGNOSIS — Z79899 Other long term (current) drug therapy: Secondary | ICD-10-CM | POA: Diagnosis not present

## 2020-10-10 DIAGNOSIS — C329 Malignant neoplasm of larynx, unspecified: Secondary | ICD-10-CM | POA: Diagnosis not present

## 2020-10-10 DIAGNOSIS — Z9002 Acquired absence of larynx: Secondary | ICD-10-CM | POA: Diagnosis not present

## 2020-10-10 DIAGNOSIS — E876 Hypokalemia: Secondary | ICD-10-CM | POA: Diagnosis not present

## 2020-10-10 DIAGNOSIS — Z87891 Personal history of nicotine dependence: Secondary | ICD-10-CM | POA: Diagnosis not present

## 2020-10-10 DIAGNOSIS — C760 Malignant neoplasm of head, face and neck: Secondary | ICD-10-CM | POA: Diagnosis not present

## 2020-10-10 DIAGNOSIS — C7989 Secondary malignant neoplasm of other specified sites: Secondary | ICD-10-CM | POA: Diagnosis not present

## 2020-10-10 DIAGNOSIS — E119 Type 2 diabetes mellitus without complications: Secondary | ICD-10-CM | POA: Diagnosis not present

## 2020-10-10 DIAGNOSIS — Z5112 Encounter for antineoplastic immunotherapy: Secondary | ICD-10-CM | POA: Diagnosis not present

## 2020-10-10 DIAGNOSIS — Z510 Encounter for antineoplastic radiation therapy: Secondary | ICD-10-CM | POA: Diagnosis not present

## 2020-10-10 DIAGNOSIS — C328 Malignant neoplasm of overlapping sites of larynx: Secondary | ICD-10-CM | POA: Diagnosis not present

## 2020-10-10 DIAGNOSIS — Z7984 Long term (current) use of oral hypoglycemic drugs: Secondary | ICD-10-CM | POA: Diagnosis not present

## 2020-10-13 DIAGNOSIS — Z9002 Acquired absence of larynx: Secondary | ICD-10-CM | POA: Diagnosis not present

## 2020-10-13 DIAGNOSIS — E119 Type 2 diabetes mellitus without complications: Secondary | ICD-10-CM | POA: Diagnosis not present

## 2020-10-13 DIAGNOSIS — Z87891 Personal history of nicotine dependence: Secondary | ICD-10-CM | POA: Diagnosis not present

## 2020-10-13 DIAGNOSIS — Z5112 Encounter for antineoplastic immunotherapy: Secondary | ICD-10-CM | POA: Diagnosis not present

## 2020-10-13 DIAGNOSIS — Z7984 Long term (current) use of oral hypoglycemic drugs: Secondary | ICD-10-CM | POA: Diagnosis not present

## 2020-10-13 DIAGNOSIS — C760 Malignant neoplasm of head, face and neck: Secondary | ICD-10-CM | POA: Diagnosis not present

## 2020-10-13 DIAGNOSIS — C329 Malignant neoplasm of larynx, unspecified: Secondary | ICD-10-CM | POA: Diagnosis not present

## 2020-10-13 DIAGNOSIS — Z510 Encounter for antineoplastic radiation therapy: Secondary | ICD-10-CM | POA: Diagnosis not present

## 2020-10-13 DIAGNOSIS — C7989 Secondary malignant neoplasm of other specified sites: Secondary | ICD-10-CM | POA: Diagnosis not present

## 2020-10-13 DIAGNOSIS — C328 Malignant neoplasm of overlapping sites of larynx: Secondary | ICD-10-CM | POA: Diagnosis not present

## 2020-10-14 ENCOUNTER — Ambulatory Visit: Payer: Medicare HMO

## 2020-10-14 DIAGNOSIS — Z5111 Encounter for antineoplastic chemotherapy: Secondary | ICD-10-CM | POA: Diagnosis not present

## 2020-10-14 DIAGNOSIS — C329 Malignant neoplasm of larynx, unspecified: Secondary | ICD-10-CM | POA: Diagnosis not present

## 2020-10-14 DIAGNOSIS — Z510 Encounter for antineoplastic radiation therapy: Secondary | ICD-10-CM | POA: Diagnosis not present

## 2020-10-14 DIAGNOSIS — E119 Type 2 diabetes mellitus without complications: Secondary | ICD-10-CM | POA: Diagnosis not present

## 2020-10-14 DIAGNOSIS — Z87891 Personal history of nicotine dependence: Secondary | ICD-10-CM | POA: Diagnosis not present

## 2020-10-14 DIAGNOSIS — C760 Malignant neoplasm of head, face and neck: Secondary | ICD-10-CM | POA: Diagnosis not present

## 2020-10-14 DIAGNOSIS — Z5112 Encounter for antineoplastic immunotherapy: Secondary | ICD-10-CM | POA: Diagnosis not present

## 2020-10-14 DIAGNOSIS — C7989 Secondary malignant neoplasm of other specified sites: Secondary | ICD-10-CM | POA: Diagnosis not present

## 2020-10-14 DIAGNOSIS — C328 Malignant neoplasm of overlapping sites of larynx: Secondary | ICD-10-CM | POA: Diagnosis not present

## 2020-10-14 DIAGNOSIS — E876 Hypokalemia: Secondary | ICD-10-CM | POA: Diagnosis not present

## 2020-10-14 DIAGNOSIS — Z9002 Acquired absence of larynx: Secondary | ICD-10-CM | POA: Diagnosis not present

## 2020-10-14 DIAGNOSIS — Z7984 Long term (current) use of oral hypoglycemic drugs: Secondary | ICD-10-CM | POA: Diagnosis not present

## 2020-10-15 DIAGNOSIS — C760 Malignant neoplasm of head, face and neck: Secondary | ICD-10-CM | POA: Diagnosis not present

## 2020-10-15 DIAGNOSIS — Z5112 Encounter for antineoplastic immunotherapy: Secondary | ICD-10-CM | POA: Diagnosis not present

## 2020-10-15 DIAGNOSIS — C329 Malignant neoplasm of larynx, unspecified: Secondary | ICD-10-CM | POA: Diagnosis not present

## 2020-10-15 DIAGNOSIS — Z510 Encounter for antineoplastic radiation therapy: Secondary | ICD-10-CM | POA: Diagnosis not present

## 2020-10-15 DIAGNOSIS — C328 Malignant neoplasm of overlapping sites of larynx: Secondary | ICD-10-CM | POA: Diagnosis not present

## 2020-10-15 DIAGNOSIS — C7989 Secondary malignant neoplasm of other specified sites: Secondary | ICD-10-CM | POA: Diagnosis not present

## 2020-10-15 DIAGNOSIS — Z7984 Long term (current) use of oral hypoglycemic drugs: Secondary | ICD-10-CM | POA: Diagnosis not present

## 2020-10-15 DIAGNOSIS — Z87891 Personal history of nicotine dependence: Secondary | ICD-10-CM | POA: Diagnosis not present

## 2020-10-15 DIAGNOSIS — E119 Type 2 diabetes mellitus without complications: Secondary | ICD-10-CM | POA: Diagnosis not present

## 2020-10-15 DIAGNOSIS — Z9002 Acquired absence of larynx: Secondary | ICD-10-CM | POA: Diagnosis not present

## 2020-10-16 DIAGNOSIS — Z7984 Long term (current) use of oral hypoglycemic drugs: Secondary | ICD-10-CM | POA: Diagnosis not present

## 2020-10-16 DIAGNOSIS — C7989 Secondary malignant neoplasm of other specified sites: Secondary | ICD-10-CM | POA: Diagnosis not present

## 2020-10-16 DIAGNOSIS — E119 Type 2 diabetes mellitus without complications: Secondary | ICD-10-CM | POA: Diagnosis not present

## 2020-10-16 DIAGNOSIS — C329 Malignant neoplasm of larynx, unspecified: Secondary | ICD-10-CM | POA: Diagnosis not present

## 2020-10-16 DIAGNOSIS — Z9002 Acquired absence of larynx: Secondary | ICD-10-CM | POA: Diagnosis not present

## 2020-10-16 DIAGNOSIS — C760 Malignant neoplasm of head, face and neck: Secondary | ICD-10-CM | POA: Diagnosis not present

## 2020-10-16 DIAGNOSIS — Z87891 Personal history of nicotine dependence: Secondary | ICD-10-CM | POA: Diagnosis not present

## 2020-10-16 DIAGNOSIS — C328 Malignant neoplasm of overlapping sites of larynx: Secondary | ICD-10-CM | POA: Diagnosis not present

## 2020-10-16 DIAGNOSIS — Z510 Encounter for antineoplastic radiation therapy: Secondary | ICD-10-CM | POA: Diagnosis not present

## 2020-10-16 DIAGNOSIS — Z5112 Encounter for antineoplastic immunotherapy: Secondary | ICD-10-CM | POA: Diagnosis not present

## 2020-10-16 DIAGNOSIS — Z430 Encounter for attention to tracheostomy: Secondary | ICD-10-CM | POA: Diagnosis not present

## 2020-10-17 DIAGNOSIS — C329 Malignant neoplasm of larynx, unspecified: Secondary | ICD-10-CM | POA: Diagnosis not present

## 2020-10-17 DIAGNOSIS — C328 Malignant neoplasm of overlapping sites of larynx: Secondary | ICD-10-CM | POA: Diagnosis not present

## 2020-10-17 DIAGNOSIS — Z7984 Long term (current) use of oral hypoglycemic drugs: Secondary | ICD-10-CM | POA: Diagnosis not present

## 2020-10-17 DIAGNOSIS — Z9002 Acquired absence of larynx: Secondary | ICD-10-CM | POA: Diagnosis not present

## 2020-10-17 DIAGNOSIS — C7989 Secondary malignant neoplasm of other specified sites: Secondary | ICD-10-CM | POA: Diagnosis not present

## 2020-10-17 DIAGNOSIS — C760 Malignant neoplasm of head, face and neck: Secondary | ICD-10-CM | POA: Diagnosis not present

## 2020-10-17 DIAGNOSIS — E119 Type 2 diabetes mellitus without complications: Secondary | ICD-10-CM | POA: Diagnosis not present

## 2020-10-17 DIAGNOSIS — Z87891 Personal history of nicotine dependence: Secondary | ICD-10-CM | POA: Diagnosis not present

## 2020-10-17 DIAGNOSIS — Z5112 Encounter for antineoplastic immunotherapy: Secondary | ICD-10-CM | POA: Diagnosis not present

## 2020-10-17 DIAGNOSIS — Z510 Encounter for antineoplastic radiation therapy: Secondary | ICD-10-CM | POA: Diagnosis not present

## 2020-10-18 DIAGNOSIS — I10 Essential (primary) hypertension: Secondary | ICD-10-CM | POA: Diagnosis not present

## 2020-10-18 DIAGNOSIS — E119 Type 2 diabetes mellitus without complications: Secondary | ICD-10-CM | POA: Diagnosis not present

## 2020-10-18 DIAGNOSIS — C7989 Secondary malignant neoplasm of other specified sites: Secondary | ICD-10-CM | POA: Diagnosis not present

## 2020-10-18 DIAGNOSIS — Z9181 History of falling: Secondary | ICD-10-CM | POA: Diagnosis not present

## 2020-10-18 DIAGNOSIS — Z7982 Long term (current) use of aspirin: Secondary | ICD-10-CM | POA: Diagnosis not present

## 2020-10-18 DIAGNOSIS — Z87891 Personal history of nicotine dependence: Secondary | ICD-10-CM | POA: Diagnosis not present

## 2020-10-18 DIAGNOSIS — Z483 Aftercare following surgery for neoplasm: Secondary | ICD-10-CM | POA: Diagnosis not present

## 2020-10-20 DIAGNOSIS — Z7984 Long term (current) use of oral hypoglycemic drugs: Secondary | ICD-10-CM | POA: Diagnosis not present

## 2020-10-20 DIAGNOSIS — C7989 Secondary malignant neoplasm of other specified sites: Secondary | ICD-10-CM | POA: Diagnosis not present

## 2020-10-20 DIAGNOSIS — C329 Malignant neoplasm of larynx, unspecified: Secondary | ICD-10-CM | POA: Diagnosis not present

## 2020-10-20 DIAGNOSIS — Z87891 Personal history of nicotine dependence: Secondary | ICD-10-CM | POA: Diagnosis not present

## 2020-10-20 DIAGNOSIS — E876 Hypokalemia: Secondary | ICD-10-CM | POA: Diagnosis not present

## 2020-10-20 DIAGNOSIS — E119 Type 2 diabetes mellitus without complications: Secondary | ICD-10-CM | POA: Diagnosis not present

## 2020-10-20 DIAGNOSIS — C328 Malignant neoplasm of overlapping sites of larynx: Secondary | ICD-10-CM | POA: Diagnosis not present

## 2020-10-20 DIAGNOSIS — C760 Malignant neoplasm of head, face and neck: Secondary | ICD-10-CM | POA: Diagnosis not present

## 2020-10-20 DIAGNOSIS — Z510 Encounter for antineoplastic radiation therapy: Secondary | ICD-10-CM | POA: Diagnosis not present

## 2020-10-20 DIAGNOSIS — Z5112 Encounter for antineoplastic immunotherapy: Secondary | ICD-10-CM | POA: Diagnosis not present

## 2020-10-20 DIAGNOSIS — Z9002 Acquired absence of larynx: Secondary | ICD-10-CM | POA: Diagnosis not present

## 2020-10-21 DIAGNOSIS — Z5112 Encounter for antineoplastic immunotherapy: Secondary | ICD-10-CM | POA: Diagnosis not present

## 2020-10-21 DIAGNOSIS — C7989 Secondary malignant neoplasm of other specified sites: Secondary | ICD-10-CM | POA: Diagnosis not present

## 2020-10-21 DIAGNOSIS — M40209 Unspecified kyphosis, site unspecified: Secondary | ICD-10-CM | POA: Diagnosis not present

## 2020-10-21 DIAGNOSIS — E119 Type 2 diabetes mellitus without complications: Secondary | ICD-10-CM | POA: Diagnosis not present

## 2020-10-21 DIAGNOSIS — Z430 Encounter for attention to tracheostomy: Secondary | ICD-10-CM | POA: Diagnosis not present

## 2020-10-21 DIAGNOSIS — Z510 Encounter for antineoplastic radiation therapy: Secondary | ICD-10-CM | POA: Diagnosis not present

## 2020-10-21 DIAGNOSIS — C328 Malignant neoplasm of overlapping sites of larynx: Secondary | ICD-10-CM | POA: Diagnosis not present

## 2020-10-21 DIAGNOSIS — M4856XA Collapsed vertebra, not elsewhere classified, lumbar region, initial encounter for fracture: Secondary | ICD-10-CM | POA: Diagnosis not present

## 2020-10-21 DIAGNOSIS — Z9002 Acquired absence of larynx: Secondary | ICD-10-CM | POA: Diagnosis not present

## 2020-10-21 DIAGNOSIS — Z87891 Personal history of nicotine dependence: Secondary | ICD-10-CM | POA: Diagnosis not present

## 2020-10-21 DIAGNOSIS — C760 Malignant neoplasm of head, face and neck: Secondary | ICD-10-CM | POA: Diagnosis not present

## 2020-10-21 DIAGNOSIS — C329 Malignant neoplasm of larynx, unspecified: Secondary | ICD-10-CM | POA: Diagnosis not present

## 2020-10-21 DIAGNOSIS — Z7984 Long term (current) use of oral hypoglycemic drugs: Secondary | ICD-10-CM | POA: Diagnosis not present

## 2020-10-22 DIAGNOSIS — E119 Type 2 diabetes mellitus without complications: Secondary | ICD-10-CM | POA: Diagnosis not present

## 2020-10-22 DIAGNOSIS — Z7984 Long term (current) use of oral hypoglycemic drugs: Secondary | ICD-10-CM | POA: Diagnosis not present

## 2020-10-22 DIAGNOSIS — C328 Malignant neoplasm of overlapping sites of larynx: Secondary | ICD-10-CM | POA: Diagnosis not present

## 2020-10-22 DIAGNOSIS — C329 Malignant neoplasm of larynx, unspecified: Secondary | ICD-10-CM | POA: Diagnosis not present

## 2020-10-22 DIAGNOSIS — C760 Malignant neoplasm of head, face and neck: Secondary | ICD-10-CM | POA: Diagnosis not present

## 2020-10-22 DIAGNOSIS — Z9002 Acquired absence of larynx: Secondary | ICD-10-CM | POA: Diagnosis not present

## 2020-10-22 DIAGNOSIS — C7989 Secondary malignant neoplasm of other specified sites: Secondary | ICD-10-CM | POA: Diagnosis not present

## 2020-10-22 DIAGNOSIS — Z5112 Encounter for antineoplastic immunotherapy: Secondary | ICD-10-CM | POA: Diagnosis not present

## 2020-10-22 DIAGNOSIS — Z87891 Personal history of nicotine dependence: Secondary | ICD-10-CM | POA: Diagnosis not present

## 2020-10-22 DIAGNOSIS — Z510 Encounter for antineoplastic radiation therapy: Secondary | ICD-10-CM | POA: Diagnosis not present

## 2020-10-23 DIAGNOSIS — C760 Malignant neoplasm of head, face and neck: Secondary | ICD-10-CM | POA: Diagnosis not present

## 2020-10-23 DIAGNOSIS — C7989 Secondary malignant neoplasm of other specified sites: Secondary | ICD-10-CM | POA: Diagnosis not present

## 2020-10-23 DIAGNOSIS — E119 Type 2 diabetes mellitus without complications: Secondary | ICD-10-CM | POA: Diagnosis not present

## 2020-10-23 DIAGNOSIS — C329 Malignant neoplasm of larynx, unspecified: Secondary | ICD-10-CM | POA: Diagnosis not present

## 2020-10-23 DIAGNOSIS — E876 Hypokalemia: Secondary | ICD-10-CM | POA: Diagnosis not present

## 2020-10-23 DIAGNOSIS — Z87891 Personal history of nicotine dependence: Secondary | ICD-10-CM | POA: Diagnosis not present

## 2020-10-23 DIAGNOSIS — Z9002 Acquired absence of larynx: Secondary | ICD-10-CM | POA: Diagnosis not present

## 2020-10-23 DIAGNOSIS — C328 Malignant neoplasm of overlapping sites of larynx: Secondary | ICD-10-CM | POA: Diagnosis not present

## 2020-10-23 DIAGNOSIS — Z7984 Long term (current) use of oral hypoglycemic drugs: Secondary | ICD-10-CM | POA: Diagnosis not present

## 2020-10-23 DIAGNOSIS — Z5112 Encounter for antineoplastic immunotherapy: Secondary | ICD-10-CM | POA: Diagnosis not present

## 2020-10-23 DIAGNOSIS — Z510 Encounter for antineoplastic radiation therapy: Secondary | ICD-10-CM | POA: Diagnosis not present

## 2020-10-24 DIAGNOSIS — C328 Malignant neoplasm of overlapping sites of larynx: Secondary | ICD-10-CM | POA: Diagnosis not present

## 2020-10-24 DIAGNOSIS — Z87891 Personal history of nicotine dependence: Secondary | ICD-10-CM | POA: Diagnosis not present

## 2020-10-24 DIAGNOSIS — C760 Malignant neoplasm of head, face and neck: Secondary | ICD-10-CM | POA: Diagnosis not present

## 2020-10-24 DIAGNOSIS — Z9002 Acquired absence of larynx: Secondary | ICD-10-CM | POA: Diagnosis not present

## 2020-10-24 DIAGNOSIS — Z7984 Long term (current) use of oral hypoglycemic drugs: Secondary | ICD-10-CM | POA: Diagnosis not present

## 2020-10-24 DIAGNOSIS — Z510 Encounter for antineoplastic radiation therapy: Secondary | ICD-10-CM | POA: Diagnosis not present

## 2020-10-24 DIAGNOSIS — Z5112 Encounter for antineoplastic immunotherapy: Secondary | ICD-10-CM | POA: Diagnosis not present

## 2020-10-24 DIAGNOSIS — E119 Type 2 diabetes mellitus without complications: Secondary | ICD-10-CM | POA: Diagnosis not present

## 2020-10-24 DIAGNOSIS — C329 Malignant neoplasm of larynx, unspecified: Secondary | ICD-10-CM | POA: Diagnosis not present

## 2020-10-24 DIAGNOSIS — C7989 Secondary malignant neoplasm of other specified sites: Secondary | ICD-10-CM | POA: Diagnosis not present

## 2020-10-27 DIAGNOSIS — Z87891 Personal history of nicotine dependence: Secondary | ICD-10-CM | POA: Diagnosis not present

## 2020-10-27 DIAGNOSIS — Z9002 Acquired absence of larynx: Secondary | ICD-10-CM | POA: Diagnosis not present

## 2020-10-27 DIAGNOSIS — C328 Malignant neoplasm of overlapping sites of larynx: Secondary | ICD-10-CM | POA: Diagnosis not present

## 2020-10-27 DIAGNOSIS — Z510 Encounter for antineoplastic radiation therapy: Secondary | ICD-10-CM | POA: Diagnosis not present

## 2020-10-27 DIAGNOSIS — Z7984 Long term (current) use of oral hypoglycemic drugs: Secondary | ICD-10-CM | POA: Diagnosis not present

## 2020-10-27 DIAGNOSIS — Z20822 Contact with and (suspected) exposure to covid-19: Secondary | ICD-10-CM | POA: Diagnosis not present

## 2020-10-27 DIAGNOSIS — C760 Malignant neoplasm of head, face and neck: Secondary | ICD-10-CM | POA: Diagnosis not present

## 2020-10-27 DIAGNOSIS — Z5112 Encounter for antineoplastic immunotherapy: Secondary | ICD-10-CM | POA: Diagnosis not present

## 2020-10-27 DIAGNOSIS — E119 Type 2 diabetes mellitus without complications: Secondary | ICD-10-CM | POA: Diagnosis not present

## 2020-10-27 DIAGNOSIS — C7989 Secondary malignant neoplasm of other specified sites: Secondary | ICD-10-CM | POA: Diagnosis not present

## 2020-10-27 DIAGNOSIS — C329 Malignant neoplasm of larynx, unspecified: Secondary | ICD-10-CM | POA: Diagnosis not present

## 2020-10-28 ENCOUNTER — Telehealth: Payer: Self-pay

## 2020-10-28 DIAGNOSIS — Z9002 Acquired absence of larynx: Secondary | ICD-10-CM | POA: Diagnosis not present

## 2020-10-28 DIAGNOSIS — Z7984 Long term (current) use of oral hypoglycemic drugs: Secondary | ICD-10-CM | POA: Diagnosis not present

## 2020-10-28 DIAGNOSIS — Z510 Encounter for antineoplastic radiation therapy: Secondary | ICD-10-CM | POA: Diagnosis not present

## 2020-10-28 DIAGNOSIS — E119 Type 2 diabetes mellitus without complications: Secondary | ICD-10-CM | POA: Diagnosis not present

## 2020-10-28 DIAGNOSIS — Z5112 Encounter for antineoplastic immunotherapy: Secondary | ICD-10-CM | POA: Diagnosis not present

## 2020-10-28 DIAGNOSIS — C328 Malignant neoplasm of overlapping sites of larynx: Secondary | ICD-10-CM | POA: Diagnosis not present

## 2020-10-28 DIAGNOSIS — C7989 Secondary malignant neoplasm of other specified sites: Secondary | ICD-10-CM | POA: Diagnosis not present

## 2020-10-28 DIAGNOSIS — C329 Malignant neoplasm of larynx, unspecified: Secondary | ICD-10-CM | POA: Diagnosis not present

## 2020-10-28 DIAGNOSIS — Z5111 Encounter for antineoplastic chemotherapy: Secondary | ICD-10-CM | POA: Diagnosis not present

## 2020-10-28 DIAGNOSIS — E876 Hypokalemia: Secondary | ICD-10-CM | POA: Diagnosis not present

## 2020-10-28 DIAGNOSIS — C760 Malignant neoplasm of head, face and neck: Secondary | ICD-10-CM | POA: Diagnosis not present

## 2020-10-28 DIAGNOSIS — Z87891 Personal history of nicotine dependence: Secondary | ICD-10-CM | POA: Diagnosis not present

## 2020-10-28 NOTE — Progress Notes (Signed)
Chronic Care Management Pharmacy Assistant   Name: Saron Tweed  MRN: [id number] DOB: [birth year]   Reason for Encounter: Medication Review/General Adherence Call.   Recent office visits:  No recent Office Visit  Recent consult visits:  08/14/2020 Otolaryngology Jeannie Fend Vaught  08/19/2020 Oncology Astrid Divine Janese Banks 08/20/2020 Otolaryngology Tiburcio Pea   08/20/2020 Speech Therapy Sadie Haber 08/23/2020 Urgent Care Marylene Land 09/10/2020 [facility]  09/10/2020 Otolaryngology Tiburcio Pea 09/10/2020 Speech Therapy Sadie Haber  09/17/2020 Otolaryngology Tiburcio Pea -stopped pantoprazole 40 mg and oxycodone 5 mg immediate release tablet. 09/17/2020 Speech Therapy Sadie Haber  09/26/2020 Otolaryngology Tiburcio Pea 09/26/2020 Speech Therapy Brain Hazel Sams 10/07/2020 Surgical Oncology Roanna Banning 10/07/2020 Hematology Sidharth Sheth 10/09/2020 Speech Therapy Brain Hazel Sams  10/10/2020 Oncology Tiburcio Pea 10/14/2020 Hematology [facility] visits:   Medication Reconciliation was completed by comparing discharge summary, patients EMR and Pharmacy list, and upon discussion with patient.  Admitted to the hospital on 08/26/2020 due to Metastatic squamous cell carcinoma to head and neck. Discharge date was 09/06/2020. Discharged from [facility]?Medications Started at [facility] Discharge:?? -started aspirin 81 MG chewable tablet Chew 1 tablet (81 mg total) daily. Start taking on: September 07, 2020  chlorhexidine 0.12 % solution Commonly known as: PERIDEX Rinse with 15 mL by mouth Three (3) times a day for 7 days. Rinse and spit  docusate sodium 100 MG capsule Commonly known as: COLACE Take 1 capsule (100 mg total) by mouth Two (2) times a day.   senna 8.6 mg tablet Commonly known as: SENOKOT Take 1 tablet by mouth nightly.  sulfamethoxazole-trimethoprim 800-160 mg per tablet Commonly known as: BACTRIM DS Take 1  tablet (160 mg of trimethoprim total) by mouth Two (2) times a day for 7 days      Medication Changes at Hospital Discharge: -Changed oxyCODONE 5 mg/5 mL solution Commonly known as: ROXICODONE Replaced by: oxyCODONE 5 MG immediate release tablet     Medications Discontinued at Hospital Discharge: -Stopped None ID  Medications that remain the same after Hospital Discharge:??  -All other medications will remain the same.    Medications: Outpatient Encounter Medications as of 10/28/2020  Medication Sig   acetaminophen (TYLENOL) 325 MG tablet Take 2 tablets (650 mg total) by mouth every 6 (six) hours as needed for mild pain (or Fever >/= 101).   amLODipine (NORVASC) 5 MG tablet TAKE (1) TABLET BY MOUTH EVERY DAY   blood glucose meter kit and supplies Accuchek or whatever is covered by insurance; check sugars once a day only if desired; Dx E11.65, LON 99 months (Patient not taking: No sig reported)   glucose blood test strip Use as instructed to check sugars once a day if desired; LON 99 months, Dx E11.65 (Patient not taking: No sig reported)   irbesartan (AVAPRO) 150 MG tablet Take 1 tablet (150 mg total) by mouth daily.   metFORMIN (GLUCOPHAGE) 1000 MG tablet Take 1 tablet (1,000 mg total) by mouth daily with breakfast. (Patient taking differently: Take 1,000 mg by mouth daily.)   Morphine Sulfate (MORPHINE CONCENTRATE) 10 mg / 0.5 ml concentrated solution Take 0.25 mLs (5 mg total) by mouth every 4 (four) hours as needed for severe pain.   pantoprazole (PROTONIX) 40 MG tablet Take 1 tablet (40 mg total) by mouth daily.   rosuvastatin (CRESTOR) 5 MG tablet TAKE ONE TABLET EVERY DAY AT BEDTIME   sitaGLIPtin (JANUVIA) 100 MG tablet Take 1 tablet (100 mg total) by mouth daily.   No facility-administered encounter  medications on file as of 10/28/2020.     Star Rating Drugs:  Irbesartan,metformin,rosuvastatin, and [place]  Called patient and discussed medication adherence  with  patient, no issues at this time with current medication.   Patient son reports ED visit since his last CPP follow up.  Patient son denies any side effects with his medication. Patient son denies any problems with his current pharmacy  Patient Son states he is doing good with his current medications but the hard part is dealing with his cancer.Patient son states they taking it day by day and he will  reach out to me or the clinical pharmacist if he has any question.  [place] Pharmacist Assistant [phone]

## 2020-10-29 DIAGNOSIS — Z510 Encounter for antineoplastic radiation therapy: Secondary | ICD-10-CM | POA: Diagnosis not present

## 2020-10-29 DIAGNOSIS — C329 Malignant neoplasm of larynx, unspecified: Secondary | ICD-10-CM | POA: Diagnosis not present

## 2020-10-29 DIAGNOSIS — I10 Essential (primary) hypertension: Secondary | ICD-10-CM | POA: Diagnosis not present

## 2020-10-29 DIAGNOSIS — Z483 Aftercare following surgery for neoplasm: Secondary | ICD-10-CM | POA: Diagnosis not present

## 2020-10-29 DIAGNOSIS — Z794 Long term (current) use of insulin: Secondary | ICD-10-CM | POA: Diagnosis not present

## 2020-10-29 DIAGNOSIS — Z9002 Acquired absence of larynx: Secondary | ICD-10-CM | POA: Diagnosis not present

## 2020-10-29 DIAGNOSIS — Z87891 Personal history of nicotine dependence: Secondary | ICD-10-CM | POA: Diagnosis not present

## 2020-10-29 DIAGNOSIS — Z7984 Long term (current) use of oral hypoglycemic drugs: Secondary | ICD-10-CM | POA: Diagnosis not present

## 2020-10-29 DIAGNOSIS — Z448 Encounter for fitting and adjustment of other external prosthetic devices: Secondary | ICD-10-CM | POA: Diagnosis not present

## 2020-10-29 DIAGNOSIS — E119 Type 2 diabetes mellitus without complications: Secondary | ICD-10-CM | POA: Diagnosis not present

## 2020-10-29 DIAGNOSIS — C7989 Secondary malignant neoplasm of other specified sites: Secondary | ICD-10-CM | POA: Diagnosis not present

## 2020-10-29 DIAGNOSIS — C328 Malignant neoplasm of overlapping sites of larynx: Secondary | ICD-10-CM | POA: Diagnosis not present

## 2020-10-29 DIAGNOSIS — Z7982 Long term (current) use of aspirin: Secondary | ICD-10-CM | POA: Diagnosis not present

## 2020-10-29 DIAGNOSIS — Z5112 Encounter for antineoplastic immunotherapy: Secondary | ICD-10-CM | POA: Diagnosis not present

## 2020-10-29 DIAGNOSIS — C760 Malignant neoplasm of head, face and neck: Secondary | ICD-10-CM | POA: Diagnosis not present

## 2020-10-29 DIAGNOSIS — Z9181 History of falling: Secondary | ICD-10-CM | POA: Diagnosis not present

## 2020-10-30 DIAGNOSIS — E119 Type 2 diabetes mellitus without complications: Secondary | ICD-10-CM | POA: Diagnosis not present

## 2020-10-30 DIAGNOSIS — Z510 Encounter for antineoplastic radiation therapy: Secondary | ICD-10-CM | POA: Diagnosis not present

## 2020-10-30 DIAGNOSIS — C760 Malignant neoplasm of head, face and neck: Secondary | ICD-10-CM | POA: Diagnosis not present

## 2020-10-30 DIAGNOSIS — Z5112 Encounter for antineoplastic immunotherapy: Secondary | ICD-10-CM | POA: Diagnosis not present

## 2020-10-30 DIAGNOSIS — Z7984 Long term (current) use of oral hypoglycemic drugs: Secondary | ICD-10-CM | POA: Diagnosis not present

## 2020-10-30 DIAGNOSIS — Z87891 Personal history of nicotine dependence: Secondary | ICD-10-CM | POA: Diagnosis not present

## 2020-10-30 DIAGNOSIS — C329 Malignant neoplasm of larynx, unspecified: Secondary | ICD-10-CM | POA: Diagnosis not present

## 2020-10-30 DIAGNOSIS — I10 Essential (primary) hypertension: Secondary | ICD-10-CM | POA: Diagnosis not present

## 2020-10-30 DIAGNOSIS — Z9002 Acquired absence of larynx: Secondary | ICD-10-CM | POA: Diagnosis not present

## 2020-10-30 DIAGNOSIS — C7989 Secondary malignant neoplasm of other specified sites: Secondary | ICD-10-CM | POA: Diagnosis not present

## 2020-10-30 DIAGNOSIS — C328 Malignant neoplasm of overlapping sites of larynx: Secondary | ICD-10-CM | POA: Diagnosis not present

## 2020-10-31 DIAGNOSIS — Z9002 Acquired absence of larynx: Secondary | ICD-10-CM | POA: Diagnosis not present

## 2020-10-31 DIAGNOSIS — Z7984 Long term (current) use of oral hypoglycemic drugs: Secondary | ICD-10-CM | POA: Diagnosis not present

## 2020-10-31 DIAGNOSIS — I10 Essential (primary) hypertension: Secondary | ICD-10-CM | POA: Diagnosis not present

## 2020-10-31 DIAGNOSIS — Z87891 Personal history of nicotine dependence: Secondary | ICD-10-CM | POA: Diagnosis not present

## 2020-10-31 DIAGNOSIS — E119 Type 2 diabetes mellitus without complications: Secondary | ICD-10-CM | POA: Diagnosis not present

## 2020-10-31 DIAGNOSIS — Z5112 Encounter for antineoplastic immunotherapy: Secondary | ICD-10-CM | POA: Diagnosis not present

## 2020-10-31 DIAGNOSIS — Z510 Encounter for antineoplastic radiation therapy: Secondary | ICD-10-CM | POA: Diagnosis not present

## 2020-10-31 DIAGNOSIS — C7989 Secondary malignant neoplasm of other specified sites: Secondary | ICD-10-CM | POA: Diagnosis not present

## 2020-10-31 DIAGNOSIS — C760 Malignant neoplasm of head, face and neck: Secondary | ICD-10-CM | POA: Diagnosis not present

## 2020-10-31 DIAGNOSIS — C328 Malignant neoplasm of overlapping sites of larynx: Secondary | ICD-10-CM | POA: Diagnosis not present

## 2020-10-31 DIAGNOSIS — C329 Malignant neoplasm of larynx, unspecified: Secondary | ICD-10-CM | POA: Diagnosis not present

## 2020-11-03 DIAGNOSIS — C328 Malignant neoplasm of overlapping sites of larynx: Secondary | ICD-10-CM | POA: Diagnosis not present

## 2020-11-03 DIAGNOSIS — Z5112 Encounter for antineoplastic immunotherapy: Secondary | ICD-10-CM | POA: Diagnosis not present

## 2020-11-03 DIAGNOSIS — Z7984 Long term (current) use of oral hypoglycemic drugs: Secondary | ICD-10-CM | POA: Diagnosis not present

## 2020-11-03 DIAGNOSIS — C760 Malignant neoplasm of head, face and neck: Secondary | ICD-10-CM | POA: Diagnosis not present

## 2020-11-03 DIAGNOSIS — E119 Type 2 diabetes mellitus without complications: Secondary | ICD-10-CM | POA: Diagnosis not present

## 2020-11-03 DIAGNOSIS — Z9002 Acquired absence of larynx: Secondary | ICD-10-CM | POA: Diagnosis not present

## 2020-11-03 DIAGNOSIS — Z510 Encounter for antineoplastic radiation therapy: Secondary | ICD-10-CM | POA: Diagnosis not present

## 2020-11-03 DIAGNOSIS — C7989 Secondary malignant neoplasm of other specified sites: Secondary | ICD-10-CM | POA: Diagnosis not present

## 2020-11-03 DIAGNOSIS — Z87891 Personal history of nicotine dependence: Secondary | ICD-10-CM | POA: Diagnosis not present

## 2020-11-03 DIAGNOSIS — C329 Malignant neoplasm of larynx, unspecified: Secondary | ICD-10-CM | POA: Diagnosis not present

## 2020-11-03 DIAGNOSIS — Z430 Encounter for attention to tracheostomy: Secondary | ICD-10-CM | POA: Diagnosis not present

## 2020-11-04 DIAGNOSIS — Z510 Encounter for antineoplastic radiation therapy: Secondary | ICD-10-CM | POA: Diagnosis not present

## 2020-11-04 DIAGNOSIS — C329 Malignant neoplasm of larynx, unspecified: Secondary | ICD-10-CM | POA: Diagnosis not present

## 2020-11-04 DIAGNOSIS — C760 Malignant neoplasm of head, face and neck: Secondary | ICD-10-CM | POA: Diagnosis not present

## 2020-11-04 DIAGNOSIS — E876 Hypokalemia: Secondary | ICD-10-CM | POA: Diagnosis not present

## 2020-11-04 DIAGNOSIS — Z7984 Long term (current) use of oral hypoglycemic drugs: Secondary | ICD-10-CM | POA: Diagnosis not present

## 2020-11-04 DIAGNOSIS — E119 Type 2 diabetes mellitus without complications: Secondary | ICD-10-CM | POA: Diagnosis not present

## 2020-11-04 DIAGNOSIS — C328 Malignant neoplasm of overlapping sites of larynx: Secondary | ICD-10-CM | POA: Diagnosis not present

## 2020-11-04 DIAGNOSIS — C321 Malignant neoplasm of supraglottis: Secondary | ICD-10-CM | POA: Diagnosis not present

## 2020-11-04 DIAGNOSIS — C7989 Secondary malignant neoplasm of other specified sites: Secondary | ICD-10-CM | POA: Diagnosis not present

## 2020-11-04 DIAGNOSIS — Z87891 Personal history of nicotine dependence: Secondary | ICD-10-CM | POA: Diagnosis not present

## 2020-11-04 DIAGNOSIS — Z9002 Acquired absence of larynx: Secondary | ICD-10-CM | POA: Diagnosis not present

## 2020-11-04 DIAGNOSIS — Z5111 Encounter for antineoplastic chemotherapy: Secondary | ICD-10-CM | POA: Diagnosis not present

## 2020-11-04 DIAGNOSIS — Z5112 Encounter for antineoplastic immunotherapy: Secondary | ICD-10-CM | POA: Diagnosis not present

## 2020-11-05 DIAGNOSIS — C328 Malignant neoplasm of overlapping sites of larynx: Secondary | ICD-10-CM | POA: Diagnosis not present

## 2020-11-05 DIAGNOSIS — C760 Malignant neoplasm of head, face and neck: Secondary | ICD-10-CM | POA: Diagnosis not present

## 2020-11-05 DIAGNOSIS — C7989 Secondary malignant neoplasm of other specified sites: Secondary | ICD-10-CM | POA: Diagnosis not present

## 2020-11-05 DIAGNOSIS — Z9181 History of falling: Secondary | ICD-10-CM | POA: Diagnosis not present

## 2020-11-05 DIAGNOSIS — Z483 Aftercare following surgery for neoplasm: Secondary | ICD-10-CM | POA: Diagnosis not present

## 2020-11-05 DIAGNOSIS — Z510 Encounter for antineoplastic radiation therapy: Secondary | ICD-10-CM | POA: Diagnosis not present

## 2020-11-05 DIAGNOSIS — C329 Malignant neoplasm of larynx, unspecified: Secondary | ICD-10-CM | POA: Diagnosis not present

## 2020-11-05 DIAGNOSIS — Z9002 Acquired absence of larynx: Secondary | ICD-10-CM | POA: Diagnosis not present

## 2020-11-05 DIAGNOSIS — Z7984 Long term (current) use of oral hypoglycemic drugs: Secondary | ICD-10-CM | POA: Diagnosis not present

## 2020-11-05 DIAGNOSIS — I10 Essential (primary) hypertension: Secondary | ICD-10-CM | POA: Diagnosis not present

## 2020-11-05 DIAGNOSIS — E119 Type 2 diabetes mellitus without complications: Secondary | ICD-10-CM | POA: Diagnosis not present

## 2020-11-05 DIAGNOSIS — Z7982 Long term (current) use of aspirin: Secondary | ICD-10-CM | POA: Diagnosis not present

## 2020-11-05 DIAGNOSIS — Z5112 Encounter for antineoplastic immunotherapy: Secondary | ICD-10-CM | POA: Diagnosis not present

## 2020-11-05 DIAGNOSIS — Z87891 Personal history of nicotine dependence: Secondary | ICD-10-CM | POA: Diagnosis not present

## 2020-11-06 DIAGNOSIS — C760 Malignant neoplasm of head, face and neck: Secondary | ICD-10-CM | POA: Diagnosis not present

## 2020-11-06 DIAGNOSIS — Z510 Encounter for antineoplastic radiation therapy: Secondary | ICD-10-CM | POA: Diagnosis not present

## 2020-11-06 DIAGNOSIS — S22070A Wedge compression fracture of T9-T10 vertebra, initial encounter for closed fracture: Secondary | ICD-10-CM | POA: Diagnosis not present

## 2020-11-06 DIAGNOSIS — C7989 Secondary malignant neoplasm of other specified sites: Secondary | ICD-10-CM | POA: Diagnosis not present

## 2020-11-06 DIAGNOSIS — Z5112 Encounter for antineoplastic immunotherapy: Secondary | ICD-10-CM | POA: Diagnosis not present

## 2020-11-06 DIAGNOSIS — C329 Malignant neoplasm of larynx, unspecified: Secondary | ICD-10-CM | POA: Diagnosis not present

## 2020-11-06 DIAGNOSIS — Z9002 Acquired absence of larynx: Secondary | ICD-10-CM | POA: Diagnosis not present

## 2020-11-06 DIAGNOSIS — Z87891 Personal history of nicotine dependence: Secondary | ICD-10-CM | POA: Diagnosis not present

## 2020-11-06 DIAGNOSIS — Z7984 Long term (current) use of oral hypoglycemic drugs: Secondary | ICD-10-CM | POA: Diagnosis not present

## 2020-11-06 DIAGNOSIS — E119 Type 2 diabetes mellitus without complications: Secondary | ICD-10-CM | POA: Diagnosis not present

## 2020-11-07 DIAGNOSIS — C7989 Secondary malignant neoplasm of other specified sites: Secondary | ICD-10-CM | POA: Diagnosis not present

## 2020-11-07 DIAGNOSIS — Z9002 Acquired absence of larynx: Secondary | ICD-10-CM | POA: Diagnosis not present

## 2020-11-07 DIAGNOSIS — C328 Malignant neoplasm of overlapping sites of larynx: Secondary | ICD-10-CM | POA: Diagnosis not present

## 2020-11-07 DIAGNOSIS — Z510 Encounter for antineoplastic radiation therapy: Secondary | ICD-10-CM | POA: Diagnosis not present

## 2020-11-07 DIAGNOSIS — Z87891 Personal history of nicotine dependence: Secondary | ICD-10-CM | POA: Diagnosis not present

## 2020-11-10 DIAGNOSIS — C328 Malignant neoplasm of overlapping sites of larynx: Secondary | ICD-10-CM | POA: Diagnosis not present

## 2020-11-10 DIAGNOSIS — C7989 Secondary malignant neoplasm of other specified sites: Secondary | ICD-10-CM | POA: Diagnosis not present

## 2020-11-10 DIAGNOSIS — Z9002 Acquired absence of larynx: Secondary | ICD-10-CM | POA: Diagnosis not present

## 2020-11-10 DIAGNOSIS — Z510 Encounter for antineoplastic radiation therapy: Secondary | ICD-10-CM | POA: Diagnosis not present

## 2020-11-10 DIAGNOSIS — Z87891 Personal history of nicotine dependence: Secondary | ICD-10-CM | POA: Diagnosis not present

## 2020-11-11 ENCOUNTER — Other Ambulatory Visit: Payer: Self-pay | Admitting: Family Medicine

## 2020-11-11 DIAGNOSIS — E876 Hypokalemia: Secondary | ICD-10-CM | POA: Diagnosis not present

## 2020-11-11 DIAGNOSIS — Z87891 Personal history of nicotine dependence: Secondary | ICD-10-CM | POA: Diagnosis not present

## 2020-11-11 DIAGNOSIS — E1169 Type 2 diabetes mellitus with other specified complication: Secondary | ICD-10-CM

## 2020-11-11 DIAGNOSIS — Z510 Encounter for antineoplastic radiation therapy: Secondary | ICD-10-CM | POA: Diagnosis not present

## 2020-11-11 DIAGNOSIS — C328 Malignant neoplasm of overlapping sites of larynx: Secondary | ICD-10-CM | POA: Diagnosis not present

## 2020-11-11 DIAGNOSIS — Z9002 Acquired absence of larynx: Secondary | ICD-10-CM | POA: Diagnosis not present

## 2020-11-11 DIAGNOSIS — C760 Malignant neoplasm of head, face and neck: Secondary | ICD-10-CM | POA: Diagnosis not present

## 2020-11-11 DIAGNOSIS — B351 Tinea unguium: Secondary | ICD-10-CM

## 2020-11-11 DIAGNOSIS — C7989 Secondary malignant neoplasm of other specified sites: Secondary | ICD-10-CM | POA: Diagnosis not present

## 2020-11-11 DIAGNOSIS — Z5111 Encounter for antineoplastic chemotherapy: Secondary | ICD-10-CM | POA: Diagnosis not present

## 2020-11-12 DIAGNOSIS — Z431 Encounter for attention to gastrostomy: Secondary | ICD-10-CM | POA: Diagnosis not present

## 2020-11-12 DIAGNOSIS — C7989 Secondary malignant neoplasm of other specified sites: Secondary | ICD-10-CM | POA: Diagnosis not present

## 2020-11-12 DIAGNOSIS — Z7984 Long term (current) use of oral hypoglycemic drugs: Secondary | ICD-10-CM | POA: Diagnosis not present

## 2020-11-12 DIAGNOSIS — Z7982 Long term (current) use of aspirin: Secondary | ICD-10-CM | POA: Diagnosis not present

## 2020-11-12 DIAGNOSIS — Z510 Encounter for antineoplastic radiation therapy: Secondary | ICD-10-CM | POA: Diagnosis not present

## 2020-11-12 DIAGNOSIS — I10 Essential (primary) hypertension: Secondary | ICD-10-CM | POA: Diagnosis not present

## 2020-11-12 DIAGNOSIS — Z9002 Acquired absence of larynx: Secondary | ICD-10-CM | POA: Diagnosis not present

## 2020-11-12 DIAGNOSIS — E785 Hyperlipidemia, unspecified: Secondary | ICD-10-CM | POA: Diagnosis not present

## 2020-11-12 DIAGNOSIS — Z483 Aftercare following surgery for neoplasm: Secondary | ICD-10-CM | POA: Diagnosis not present

## 2020-11-12 DIAGNOSIS — E119 Type 2 diabetes mellitus without complications: Secondary | ICD-10-CM | POA: Diagnosis not present

## 2020-11-12 DIAGNOSIS — Z87891 Personal history of nicotine dependence: Secondary | ICD-10-CM | POA: Diagnosis not present

## 2020-11-12 DIAGNOSIS — E46 Unspecified protein-calorie malnutrition: Secondary | ICD-10-CM | POA: Diagnosis not present

## 2020-11-12 DIAGNOSIS — C328 Malignant neoplasm of overlapping sites of larynx: Secondary | ICD-10-CM | POA: Diagnosis not present

## 2020-11-12 DIAGNOSIS — Z430 Encounter for attention to tracheostomy: Secondary | ICD-10-CM | POA: Diagnosis not present

## 2020-11-14 DIAGNOSIS — C7989 Secondary malignant neoplasm of other specified sites: Secondary | ICD-10-CM | POA: Diagnosis not present

## 2020-11-14 DIAGNOSIS — M7989 Other specified soft tissue disorders: Secondary | ICD-10-CM | POA: Diagnosis not present

## 2020-11-17 DIAGNOSIS — Z87891 Personal history of nicotine dependence: Secondary | ICD-10-CM | POA: Diagnosis not present

## 2020-11-17 DIAGNOSIS — Z510 Encounter for antineoplastic radiation therapy: Secondary | ICD-10-CM | POA: Diagnosis not present

## 2020-11-17 DIAGNOSIS — C328 Malignant neoplasm of overlapping sites of larynx: Secondary | ICD-10-CM | POA: Diagnosis not present

## 2020-11-17 DIAGNOSIS — Z9002 Acquired absence of larynx: Secondary | ICD-10-CM | POA: Diagnosis not present

## 2020-11-17 DIAGNOSIS — C7989 Secondary malignant neoplasm of other specified sites: Secondary | ICD-10-CM | POA: Diagnosis not present

## 2020-11-18 DIAGNOSIS — R2231 Localized swelling, mass and lump, right upper limb: Secondary | ICD-10-CM | POA: Diagnosis not present

## 2020-11-18 DIAGNOSIS — C328 Malignant neoplasm of overlapping sites of larynx: Secondary | ICD-10-CM | POA: Diagnosis not present

## 2020-11-18 DIAGNOSIS — Z5111 Encounter for antineoplastic chemotherapy: Secondary | ICD-10-CM | POA: Diagnosis not present

## 2020-11-18 DIAGNOSIS — C760 Malignant neoplasm of head, face and neck: Secondary | ICD-10-CM | POA: Diagnosis not present

## 2020-11-18 DIAGNOSIS — Z510 Encounter for antineoplastic radiation therapy: Secondary | ICD-10-CM | POA: Diagnosis not present

## 2020-11-18 DIAGNOSIS — C7989 Secondary malignant neoplasm of other specified sites: Secondary | ICD-10-CM | POA: Diagnosis not present

## 2020-11-18 DIAGNOSIS — Z87891 Personal history of nicotine dependence: Secondary | ICD-10-CM | POA: Diagnosis not present

## 2020-11-18 DIAGNOSIS — E876 Hypokalemia: Secondary | ICD-10-CM | POA: Diagnosis not present

## 2020-11-18 DIAGNOSIS — Z9002 Acquired absence of larynx: Secondary | ICD-10-CM | POA: Diagnosis not present

## 2020-11-19 DIAGNOSIS — Z9002 Acquired absence of larynx: Secondary | ICD-10-CM | POA: Diagnosis not present

## 2020-11-19 DIAGNOSIS — C7989 Secondary malignant neoplasm of other specified sites: Secondary | ICD-10-CM | POA: Diagnosis not present

## 2020-11-19 DIAGNOSIS — E119 Type 2 diabetes mellitus without complications: Secondary | ICD-10-CM | POA: Diagnosis not present

## 2020-11-19 DIAGNOSIS — R2231 Localized swelling, mass and lump, right upper limb: Secondary | ICD-10-CM | POA: Diagnosis not present

## 2020-11-19 DIAGNOSIS — I82290 Acute embolism and thrombosis of other thoracic veins: Secondary | ICD-10-CM | POA: Diagnosis not present

## 2020-11-19 DIAGNOSIS — Z7984 Long term (current) use of oral hypoglycemic drugs: Secondary | ICD-10-CM | POA: Diagnosis not present

## 2020-11-19 DIAGNOSIS — I10 Essential (primary) hypertension: Secondary | ICD-10-CM | POA: Diagnosis not present

## 2020-11-19 DIAGNOSIS — Z7982 Long term (current) use of aspirin: Secondary | ICD-10-CM | POA: Diagnosis not present

## 2020-11-19 DIAGNOSIS — I82611 Acute embolism and thrombosis of superficial veins of right upper extremity: Secondary | ICD-10-CM | POA: Diagnosis not present

## 2020-11-19 DIAGNOSIS — Z431 Encounter for attention to gastrostomy: Secondary | ICD-10-CM | POA: Diagnosis not present

## 2020-11-19 DIAGNOSIS — Z510 Encounter for antineoplastic radiation therapy: Secondary | ICD-10-CM | POA: Diagnosis not present

## 2020-11-19 DIAGNOSIS — I82622 Acute embolism and thrombosis of deep veins of left upper extremity: Secondary | ICD-10-CM | POA: Diagnosis not present

## 2020-11-19 DIAGNOSIS — E785 Hyperlipidemia, unspecified: Secondary | ICD-10-CM | POA: Diagnosis not present

## 2020-11-19 DIAGNOSIS — E46 Unspecified protein-calorie malnutrition: Secondary | ICD-10-CM | POA: Diagnosis not present

## 2020-11-19 DIAGNOSIS — C328 Malignant neoplasm of overlapping sites of larynx: Secondary | ICD-10-CM | POA: Diagnosis not present

## 2020-11-19 DIAGNOSIS — Z483 Aftercare following surgery for neoplasm: Secondary | ICD-10-CM | POA: Diagnosis not present

## 2020-11-19 DIAGNOSIS — Z87891 Personal history of nicotine dependence: Secondary | ICD-10-CM | POA: Diagnosis not present

## 2020-11-20 DIAGNOSIS — Z9002 Acquired absence of larynx: Secondary | ICD-10-CM | POA: Diagnosis not present

## 2020-11-20 DIAGNOSIS — C328 Malignant neoplasm of overlapping sites of larynx: Secondary | ICD-10-CM | POA: Diagnosis not present

## 2020-11-20 DIAGNOSIS — Z510 Encounter for antineoplastic radiation therapy: Secondary | ICD-10-CM | POA: Diagnosis not present

## 2020-11-20 DIAGNOSIS — C7989 Secondary malignant neoplasm of other specified sites: Secondary | ICD-10-CM | POA: Diagnosis not present

## 2020-11-20 DIAGNOSIS — Z87891 Personal history of nicotine dependence: Secondary | ICD-10-CM | POA: Diagnosis not present

## 2020-11-21 DIAGNOSIS — C7989 Secondary malignant neoplasm of other specified sites: Secondary | ICD-10-CM | POA: Diagnosis not present

## 2020-11-21 DIAGNOSIS — Z510 Encounter for antineoplastic radiation therapy: Secondary | ICD-10-CM | POA: Diagnosis not present

## 2020-11-21 DIAGNOSIS — Z87891 Personal history of nicotine dependence: Secondary | ICD-10-CM | POA: Diagnosis not present

## 2020-11-21 DIAGNOSIS — C328 Malignant neoplasm of overlapping sites of larynx: Secondary | ICD-10-CM | POA: Diagnosis not present

## 2020-11-21 DIAGNOSIS — Z9002 Acquired absence of larynx: Secondary | ICD-10-CM | POA: Diagnosis not present

## 2020-11-22 ENCOUNTER — Other Ambulatory Visit: Payer: Self-pay | Admitting: Family Medicine

## 2020-11-22 DIAGNOSIS — E1169 Type 2 diabetes mellitus with other specified complication: Secondary | ICD-10-CM

## 2020-11-22 DIAGNOSIS — B351 Tinea unguium: Secondary | ICD-10-CM

## 2020-11-24 DIAGNOSIS — Z510 Encounter for antineoplastic radiation therapy: Secondary | ICD-10-CM | POA: Diagnosis not present

## 2020-11-24 DIAGNOSIS — C328 Malignant neoplasm of overlapping sites of larynx: Secondary | ICD-10-CM | POA: Diagnosis not present

## 2020-11-24 DIAGNOSIS — Z9002 Acquired absence of larynx: Secondary | ICD-10-CM | POA: Diagnosis not present

## 2020-11-24 DIAGNOSIS — C7989 Secondary malignant neoplasm of other specified sites: Secondary | ICD-10-CM | POA: Diagnosis not present

## 2020-11-24 DIAGNOSIS — Z87891 Personal history of nicotine dependence: Secondary | ICD-10-CM | POA: Diagnosis not present

## 2020-11-25 ENCOUNTER — Other Ambulatory Visit: Payer: Self-pay | Admitting: Family Medicine

## 2020-11-25 DIAGNOSIS — B351 Tinea unguium: Secondary | ICD-10-CM

## 2020-11-25 DIAGNOSIS — E1169 Type 2 diabetes mellitus with other specified complication: Secondary | ICD-10-CM

## 2020-11-25 DIAGNOSIS — Z430 Encounter for attention to tracheostomy: Secondary | ICD-10-CM | POA: Diagnosis not present

## 2020-11-25 DIAGNOSIS — C7989 Secondary malignant neoplasm of other specified sites: Secondary | ICD-10-CM | POA: Diagnosis not present

## 2020-11-25 NOTE — Telephone Encounter (Signed)
   Notes to clinic: Patient has been set up for appointment tomorrow and would like to have one tablet called in    Requested Prescriptions  Pending Prescriptions Disp Refills   sitaGLIPtin (JANUVIA) 100 MG tablet 90 tablet 3    Sig: Take 1 tablet (100 mg total) by mouth daily.      Endocrinology:  Diabetes - DPP-4 Inhibitors Failed - 11/25/2020  3:23 PM      Failed - HBA1C is between 0 and 7.9 and within 180 days    Hgb A1c MFr Bld  Date Value Ref Range Status  02/25/2020 5.7 (H) <5.7 % of total Hgb Final    Comment:    For someone without known diabetes, a hemoglobin  A1c value between 5.7% and 6.4% is consistent with prediabetes and should be confirmed with a  follow-up test. . For someone with known diabetes, a value <7% indicates that their diabetes is well controlled. A1c targets should be individualized based on duration of diabetes, age, comorbid conditions, and other considerations. . This assay result is consistent with an increased risk of diabetes. . Currently, no consensus exists regarding use of hemoglobin A1c for diagnosis of diabetes for children. .           Passed - Cr in normal range and within 360 days    Creat  Date Value Ref Range Status  04/21/2020 0.90 0.70 - 1.11 mg/dL Final    Comment:    For patients >42 years of age, the reference limit for Creatinine is approximately 13% higher for people identified as African-American. .    Creatinine, Ser  Date Value Ref Range Status  08/19/2020 0.99 0.61 - 1.24 mg/dL Final   Creatinine, Urine  Date Value Ref Range Status  12/13/2017 29 20 - 320 mg/dL Final          Passed - Valid encounter within last 6 months    Recent Outpatient Visits           6 months ago Sore throat   [facility] [place], Kristeen Miss, PA-C   7 months ago Unintentional weight loss   [facility] [place], [place], PA-C   8 months ago Unintentional weight loss   [facility] Steele Sizer, MD   9 months ago Mixed hyperlipidemia   [facility] Delsa Grana, PA-C   1 year ago Panlobular emphysema [facility])   [facility] Delsa Grana, [place]       Future Appointments             Tomorrow Delsa Grana, PA-C [facility], [place] Delsa Grana, PA-C [facility], [facility]

## 2020-11-25 NOTE — Telephone Encounter (Signed)
Copied from [place] [id number]. Topic: Quick Communication - Rx Refill/Question >> Nov 25, 2020  3:17 PM Lenon Curt, Everette A wrote: Medication: sitaGLIPtin (JANUVIA) 100 MG tablet  Has the patient contacted their pharmacy? No. Patient was uncertain of the medication's refill status. Patient was directed to contact their pharmacy first for future prescriptions.   Preferred Pharmacy (with phone number or street name): [place], [place] - [place]  Phone:  [phone] Fax:  [phone]  Agent: Please be advised that RX refills may take up to 3 business days. We ask that you follow-up with your pharmacy.

## 2020-11-26 ENCOUNTER — Encounter: Payer: Self-pay | Admitting: Family Medicine

## 2020-11-26 ENCOUNTER — Ambulatory Visit (INDEPENDENT_AMBULATORY_CARE_PROVIDER_SITE_OTHER): Payer: Medicare HMO | Admitting: Family Medicine

## 2020-11-26 ENCOUNTER — Other Ambulatory Visit: Payer: Self-pay

## 2020-11-26 ENCOUNTER — Ambulatory Visit: Payer: Self-pay | Admitting: Family Medicine

## 2020-11-26 VITALS — BP 118/62 | HR 60 | Temp 98.4°F | Resp 15 | Wt 117.6 lb

## 2020-11-26 DIAGNOSIS — E876 Hypokalemia: Secondary | ICD-10-CM | POA: Diagnosis not present

## 2020-11-26 DIAGNOSIS — I10 Essential (primary) hypertension: Secondary | ICD-10-CM | POA: Diagnosis not present

## 2020-11-26 DIAGNOSIS — J431 Panlobular emphysema: Secondary | ICD-10-CM | POA: Diagnosis not present

## 2020-11-26 DIAGNOSIS — E1169 Type 2 diabetes mellitus with other specified complication: Secondary | ICD-10-CM

## 2020-11-26 DIAGNOSIS — C7989 Secondary malignant neoplasm of other specified sites: Secondary | ICD-10-CM

## 2020-11-26 DIAGNOSIS — E782 Mixed hyperlipidemia: Secondary | ICD-10-CM

## 2020-11-26 DIAGNOSIS — Z720 Tobacco use: Secondary | ICD-10-CM | POA: Diagnosis not present

## 2020-11-26 DIAGNOSIS — B351 Tinea unguium: Secondary | ICD-10-CM

## 2020-11-26 DIAGNOSIS — Z5181 Encounter for therapeutic drug level monitoring: Secondary | ICD-10-CM | POA: Diagnosis not present

## 2020-11-26 DIAGNOSIS — E440 Moderate protein-calorie malnutrition: Secondary | ICD-10-CM

## 2020-11-26 DIAGNOSIS — C329 Malignant neoplasm of larynx, unspecified: Secondary | ICD-10-CM

## 2020-11-26 DIAGNOSIS — J3489 Other specified disorders of nose and nasal sinuses: Secondary | ICD-10-CM

## 2020-11-26 DIAGNOSIS — E119 Type 2 diabetes mellitus without complications: Secondary | ICD-10-CM | POA: Diagnosis not present

## 2020-11-26 NOTE — Telephone Encounter (Signed)
Pt already have appt scheduled for today

## 2020-11-26 NOTE — Progress Notes (Signed)
Name: Connor Aguilar   MRN: [id number]    DOB: 09/09/1940   Date:11/26/2020       Progress Note  Chief Complaint  Patient presents with   Follow-up     Subjective:   Connor Aguilar is a 80 y.o. male, presents to clinic for routine f/up  Recently dx with cancer- tx through [facility] - did radiation to neck/throat CA, has trach and G-tube Mood really down Significant weight loss, he is here with his daughter  DM:   Pt managing DM with [place] Reports good med compliance Pt has no SE from meds. Blood sugars -not checking Denies: Polyuria, polydipsia, vision changes, neuropathy, hypoglycemia Recent pertinent labs: Lab Results  Component Value Date   HGBA1C 5.7 (H) 02/25/2020   HGBA1C 6.5 (H) 10/23/2019   HGBA1C 7.1 (H) 09/27/2018   Standard of care and health maintenance: Foot exam:  due DM eye exam:  due ACEI/ARB:  On ARB Statin:  crestor  Hyperlipidemia: Currently treated with crestor 5 mg, pt reports good med compliance Last Lipids: Lab Results  Component Value Date   CHOL 94 10/23/2019   HDL 42 10/23/2019   LDLCALC 29 10/23/2019   TRIG 145 10/23/2019   CHOLHDL 2.2 10/23/2019   - Denies: Chest pain, shortness of breath, myalgias, claudication  Hypertension:  Currently managed on amlodipine - meds changed with significant change to health, weight, PO intake etc Pt reports good med compliance and denies any SE.   Blood pressure today is well controlled. BP Readings from Last 3 Encounters:  08/19/20 127/79  08/07/20 131/68  05/15/20 129/70   Pt denies CP, SOB, exertional sx, LE edema, palpitation, Ha's, visual disturbances, lightheadedness, hypotension, syncope.   Cancer care and [facility] - chart dx and meds reviewed and updated today  Profuse nasal discharge and suction - worse than his normal  Concerns of needing HH with oncology care   Current Outpatient Medications:    acetaminophen (TYLENOL) 325 MG tablet, Take 2 tablets (650 mg total) by mouth every 6  (six) hours as needed for mild pain (or Fever >/= 101)., Disp: , Rfl:    amLODipine (NORVASC) 5 MG tablet, TAKE (1) TABLET BY MOUTH EVERY DAY, Disp: 90 tablet, Rfl: 3   blood glucose meter kit and supplies, Accuchek or whatever is covered by insurance; check sugars once a day only if desired; Dx E11.65, LON 99 months (Patient not taking: No sig reported), Disp: 1 each, Rfl: 0   glucose blood test strip, Use as instructed to check sugars once a day if desired; LON 99 months, Dx E11.65 (Patient not taking: No sig reported), Disp: 100 each, Rfl: 3   irbesartan (AVAPRO) 150 MG tablet, Take 1 tablet (150 mg total) by mouth daily., Disp: 90 tablet, Rfl: 3   metFORMIN (GLUCOPHAGE) 1000 MG tablet, Take 1 tablet (1,000 mg total) by mouth daily with breakfast. (Patient taking differently: Take 1,000 mg by mouth daily.), Disp: 180 tablet, Rfl: 3   Morphine Sulfate (MORPHINE CONCENTRATE) 10 mg / 0.5 ml concentrated solution, Take 0.25 mLs (5 mg total) by mouth every 4 (four) hours as needed for severe pain., Disp: 30 mL, Rfl: 0   pantoprazole (PROTONIX) 40 MG tablet, Take 1 tablet (40 mg total) by mouth daily., Disp: 30 tablet, Rfl: 3   rosuvastatin (CRESTOR) 5 MG tablet, TAKE ONE TABLET EVERY DAY AT BEDTIME, Disp: 90 tablet, Rfl: 3   sitaGLIPtin (JANUVIA) 100 MG tablet, Take 1 tablet (100 mg total) by mouth daily., Disp: 90  tablet, Rfl: 3  Patient Active Problem List   Diagnosis Date Noted   Goals of care, counseling/discussion 08/19/2020   Laryngeal cancer ([place]) 08/19/2020   Chronic arthropathy 10/25/2019   Onychomycosis of multiple toenails with type 2 diabetes mellitus ([place]) 05/16/2018   Erectile dysfunction 08/19/2015   Tobacco abuse 04/09/2015   Hyperlipidemia 04/02/2015   Essential hypertension 04/02/2015   Panlobular emphysema ([place]) 04/02/2015    Past Surgical History:  Procedure Laterality Date   FOOT SURGERY      Family History  Problem Relation Age of Onset   Diabetes Mother      Social History   Tobacco Use   Smoking status: Current Every Day Smoker    Packs/day: 1.50    Years: 64.00    Pack years: 96.00    Types: Cigarettes   Smokeless tobacco: Never Used  Scientific laboratory technician Use: Never used  Substance Use Topics   Alcohol use: No    Alcohol/week: 0.0 standard drinks   Drug use: No     No Known Allergies  Health Maintenance  Topic Date Due   COVID-19 Vaccine (1) Never done   OPHTHALMOLOGY EXAM  01/05/2020   HEMOGLOBIN A1C  08/27/2020   FOOT EXAM  10/22/2020   PNA vac Low Risk Adult (2 of 2 - PPSV23) 03/04/2021   INFLUENZA VACCINE  03/09/2021   TETANUS/TDAP  07/26/2025   HPV VACCINES  Aged Out    Chart Review Today: I personally reviewed active problem list, medication list, allergies, family history, social history, health maintenance, notes from last encounter, lab results, imaging with the patient/caregiver today.   Review of Systems  Constitutional: Negative.   HENT: Negative.    Eyes: Negative.   Respiratory: Negative.    Cardiovascular: Negative.   Gastrointestinal: Negative.   Endocrine: Negative.   Genitourinary: Negative.   Musculoskeletal: Negative.   Skin: Negative.   Allergic/Immunologic: Negative.   Neurological: Negative.   Hematological: Negative.   Psychiatric/Behavioral: Negative.    All other systems reviewed and are negative.   Objective:   Vitals:   11/26/20 1104  BP: 118/62  Pulse: 60  Resp: 15  Temp: 98.4 F (36.9 C)  TempSrc: Oral  SpO2: 99%  Weight: 117 lb 9.6 oz (53.3 kg)    Body mass index is 17.88 kg/m.  Physical Exam Vitals and nursing note reviewed.  Constitutional:      General: He is not in acute distress.    Appearance: He is ill-appearing. He is not toxic-appearing or diaphoretic.     Comments: Thin, chronically ill appearing  HENT:     Head: Normocephalic and atraumatic.  Eyes:     General:        Right eye: No discharge.        Left eye: No discharge.      Conjunctiva/sclera: Conjunctivae normal.  Cardiovascular:     Rate and Rhythm: Normal rate and regular rhythm.     Pulses: Normal pulses.     Heart sounds: Normal heart sounds.  Pulmonary:     Effort: Pulmonary effort is normal.     Breath sounds: Rhonchi present.  Abdominal:     General: Bowel sounds are normal.     Palpations: Abdomen is soft.  Skin:    Coloration: Skin is not jaundiced or pale.     Findings: No rash.  Neurological:     Mental Status: He is alert.  Psychiatric:        Attention and Perception: Attention  normal.        Mood and Affect: Mood is depressed.        Behavior: Behavior normal. Behavior is cooperative.        Assessment & Plan:     ICD-10-CM   1. Type 2 diabetes mellitus without complication, without long-term current use of insulin (HCC)  E11.9 Hemoglobin A1c    Lipid panel    2. Encounter for medication monitoring  Z51.81 Hemoglobin A1c    Lipid panel    3. Essential hypertension  I10    stable, well controlled, reduce meds with chagne in weight/health - can continue norvasc for now    4. Mixed hyperlipidemia  E78.2 Lipid panel    5. Onychomycosis of multiple toenails with type 2 diabetes mellitus ([place])  E11.69    B35.1     6. Panlobular emphysema ([place])  J43.1     7. Laryngeal cancer ([place])  C32.9    per [facility] oncology    8. Tobacco abuse  Z72.0    stopped with CA dx and treatment    9. Hypokalemia  E87.6    monitoring, labs and replace as needed - frequent labs done through [facility]    10. Metastatic squamous cell carcinoma to head and neck (HCC)  C79.89     11. Moderate protein-calorie malnutrition ([place])  E44.0    G-tube - working with feeds and nutritional support with oncology at [facility]    12. Nasal drainage  J34.89 Wound culture       Return in about 6 weeks (around 01/07/2021) for 6 week f/u virtual for depression.   Delsa Grana, PA-C 11/26/20 8:58 AM

## 2020-11-27 ENCOUNTER — Telehealth: Payer: Self-pay | Admitting: Family Medicine

## 2020-11-27 DIAGNOSIS — E46 Unspecified protein-calorie malnutrition: Secondary | ICD-10-CM | POA: Diagnosis not present

## 2020-11-27 DIAGNOSIS — J3489 Other specified disorders of nose and nasal sinuses: Secondary | ICD-10-CM | POA: Diagnosis not present

## 2020-11-27 DIAGNOSIS — Z7984 Long term (current) use of oral hypoglycemic drugs: Secondary | ICD-10-CM | POA: Diagnosis not present

## 2020-11-27 DIAGNOSIS — Z431 Encounter for attention to gastrostomy: Secondary | ICD-10-CM | POA: Diagnosis not present

## 2020-11-27 DIAGNOSIS — Z7982 Long term (current) use of aspirin: Secondary | ICD-10-CM | POA: Diagnosis not present

## 2020-11-27 DIAGNOSIS — E119 Type 2 diabetes mellitus without complications: Secondary | ICD-10-CM | POA: Diagnosis not present

## 2020-11-27 DIAGNOSIS — E785 Hyperlipidemia, unspecified: Secondary | ICD-10-CM | POA: Diagnosis not present

## 2020-11-27 DIAGNOSIS — C7989 Secondary malignant neoplasm of other specified sites: Secondary | ICD-10-CM | POA: Diagnosis not present

## 2020-11-27 DIAGNOSIS — Z483 Aftercare following surgery for neoplasm: Secondary | ICD-10-CM | POA: Diagnosis not present

## 2020-11-27 DIAGNOSIS — I10 Essential (primary) hypertension: Secondary | ICD-10-CM | POA: Diagnosis not present

## 2020-11-27 DIAGNOSIS — R32 Unspecified urinary incontinence: Secondary | ICD-10-CM

## 2020-11-27 DIAGNOSIS — R350 Frequency of micturition: Secondary | ICD-10-CM

## 2020-11-27 LAB — LIPID PANEL
Cholesterol: 124 mg/dL (ref <200)
HDL: 72 mg/dL (ref >=40)
LDL Cholesterol (Calc): 33 mg/dL (calc)
Non-HDL Cholesterol (Calc): 52 mg/dL (calc) (ref <130)
Total CHOL/HDL Ratio: 1.7 (calc) (ref <5.0)
Triglycerides: 100 mg/dL (ref <150)

## 2020-11-27 LAB — HEMOGLOBIN A1C
Hgb A1c MFr Bld: 6.3 % of total Hgb — ABNORMAL HIGH (ref <5.7)
Mean Plasma Glucose: 134 mg/dL
eAG (mmol/L): 7.4 mmol/L

## 2020-11-27 NOTE — Telephone Encounter (Signed)
Home Health Verbal Orders - [place]  [facility] Number: [phone]  Requesting OT/PT/Skilled Nursing/Social Work/Speech [place]  OT 1x1, 2x2, 1x1  And also a PT consult

## 2020-11-28 ENCOUNTER — Other Ambulatory Visit: Payer: Self-pay | Admitting: Family Medicine

## 2020-11-28 DIAGNOSIS — C7989 Secondary malignant neoplasm of other specified sites: Secondary | ICD-10-CM | POA: Diagnosis not present

## 2020-11-28 DIAGNOSIS — Z7982 Long term (current) use of aspirin: Secondary | ICD-10-CM | POA: Diagnosis not present

## 2020-11-28 DIAGNOSIS — I10 Essential (primary) hypertension: Secondary | ICD-10-CM | POA: Diagnosis not present

## 2020-11-28 DIAGNOSIS — E46 Unspecified protein-calorie malnutrition: Secondary | ICD-10-CM | POA: Diagnosis not present

## 2020-11-28 DIAGNOSIS — E1169 Type 2 diabetes mellitus with other specified complication: Secondary | ICD-10-CM

## 2020-11-28 DIAGNOSIS — E119 Type 2 diabetes mellitus without complications: Secondary | ICD-10-CM | POA: Diagnosis not present

## 2020-11-28 DIAGNOSIS — Z7984 Long term (current) use of oral hypoglycemic drugs: Secondary | ICD-10-CM | POA: Diagnosis not present

## 2020-11-28 DIAGNOSIS — B351 Tinea unguium: Secondary | ICD-10-CM

## 2020-11-28 DIAGNOSIS — Z483 Aftercare following surgery for neoplasm: Secondary | ICD-10-CM | POA: Diagnosis not present

## 2020-11-28 DIAGNOSIS — E785 Hyperlipidemia, unspecified: Secondary | ICD-10-CM | POA: Diagnosis not present

## 2020-11-28 DIAGNOSIS — Z431 Encounter for attention to gastrostomy: Secondary | ICD-10-CM | POA: Diagnosis not present

## 2020-11-30 LAB — WOUND CULTURE
MICRO NUMBER:: 11802673
SPECIMEN QUALITY:: ADEQUATE

## 2020-12-01 ENCOUNTER — Other Ambulatory Visit: Payer: Self-pay | Admitting: Family Medicine

## 2020-12-01 ENCOUNTER — Encounter: Payer: Self-pay | Admitting: Family Medicine

## 2020-12-01 DIAGNOSIS — E782 Mixed hyperlipidemia: Secondary | ICD-10-CM

## 2020-12-01 DIAGNOSIS — B351 Tinea unguium: Secondary | ICD-10-CM

## 2020-12-01 DIAGNOSIS — C7989 Secondary malignant neoplasm of other specified sites: Secondary | ICD-10-CM

## 2020-12-01 DIAGNOSIS — E1169 Type 2 diabetes mellitus with other specified complication: Secondary | ICD-10-CM

## 2020-12-01 DIAGNOSIS — J3489 Other specified disorders of nose and nasal sinuses: Secondary | ICD-10-CM

## 2020-12-01 DIAGNOSIS — Z5181 Encounter for therapeutic drug level monitoring: Secondary | ICD-10-CM

## 2020-12-01 MED ORDER — ROSUVASTATIN CALCIUM 5 MG PO TABS: ORAL_TABLET | ORAL | 3 refills | Status: DC

## 2020-12-02 DIAGNOSIS — I10 Essential (primary) hypertension: Secondary | ICD-10-CM | POA: Diagnosis not present

## 2020-12-02 DIAGNOSIS — C329 Malignant neoplasm of larynx, unspecified: Secondary | ICD-10-CM | POA: Diagnosis not present

## 2020-12-02 DIAGNOSIS — Z79899 Other long term (current) drug therapy: Secondary | ICD-10-CM | POA: Diagnosis not present

## 2020-12-02 DIAGNOSIS — Z9002 Acquired absence of larynx: Secondary | ICD-10-CM | POA: Diagnosis not present

## 2020-12-02 DIAGNOSIS — E119 Type 2 diabetes mellitus without complications: Secondary | ICD-10-CM | POA: Diagnosis not present

## 2020-12-02 DIAGNOSIS — Z7901 Long term (current) use of anticoagulants: Secondary | ICD-10-CM | POA: Diagnosis not present

## 2020-12-02 DIAGNOSIS — D701 Agranulocytosis secondary to cancer chemotherapy: Secondary | ICD-10-CM | POA: Diagnosis not present

## 2020-12-02 DIAGNOSIS — T451X5A Adverse effect of antineoplastic and immunosuppressive drugs, initial encounter: Secondary | ICD-10-CM | POA: Diagnosis not present

## 2020-12-02 DIAGNOSIS — C7989 Secondary malignant neoplasm of other specified sites: Secondary | ICD-10-CM | POA: Diagnosis not present

## 2020-12-02 DIAGNOSIS — R062 Wheezing: Secondary | ICD-10-CM | POA: Diagnosis not present

## 2020-12-02 DIAGNOSIS — Z7984 Long term (current) use of oral hypoglycemic drugs: Secondary | ICD-10-CM | POA: Diagnosis not present

## 2020-12-03 DIAGNOSIS — Z483 Aftercare following surgery for neoplasm: Secondary | ICD-10-CM | POA: Diagnosis not present

## 2020-12-03 DIAGNOSIS — I10 Essential (primary) hypertension: Secondary | ICD-10-CM | POA: Diagnosis not present

## 2020-12-03 DIAGNOSIS — Z7982 Long term (current) use of aspirin: Secondary | ICD-10-CM | POA: Diagnosis not present

## 2020-12-03 DIAGNOSIS — C7989 Secondary malignant neoplasm of other specified sites: Secondary | ICD-10-CM | POA: Diagnosis not present

## 2020-12-03 DIAGNOSIS — Z431 Encounter for attention to gastrostomy: Secondary | ICD-10-CM | POA: Diagnosis not present

## 2020-12-03 DIAGNOSIS — E119 Type 2 diabetes mellitus without complications: Secondary | ICD-10-CM | POA: Diagnosis not present

## 2020-12-03 DIAGNOSIS — E785 Hyperlipidemia, unspecified: Secondary | ICD-10-CM | POA: Diagnosis not present

## 2020-12-03 DIAGNOSIS — Z7984 Long term (current) use of oral hypoglycemic drugs: Secondary | ICD-10-CM | POA: Diagnosis not present

## 2020-12-03 DIAGNOSIS — E46 Unspecified protein-calorie malnutrition: Secondary | ICD-10-CM | POA: Diagnosis not present

## 2020-12-03 NOTE — Telephone Encounter (Signed)
Left message for Stephaine at [facility] for approval of orders

## 2020-12-04 DIAGNOSIS — Z430 Encounter for attention to tracheostomy: Secondary | ICD-10-CM | POA: Diagnosis not present

## 2020-12-04 DIAGNOSIS — C7989 Secondary malignant neoplasm of other specified sites: Secondary | ICD-10-CM | POA: Diagnosis not present

## 2020-12-05 DIAGNOSIS — Z7982 Long term (current) use of aspirin: Secondary | ICD-10-CM | POA: Diagnosis not present

## 2020-12-05 DIAGNOSIS — E119 Type 2 diabetes mellitus without complications: Secondary | ICD-10-CM | POA: Diagnosis not present

## 2020-12-05 DIAGNOSIS — E785 Hyperlipidemia, unspecified: Secondary | ICD-10-CM | POA: Diagnosis not present

## 2020-12-05 DIAGNOSIS — Z431 Encounter for attention to gastrostomy: Secondary | ICD-10-CM | POA: Diagnosis not present

## 2020-12-05 DIAGNOSIS — C7989 Secondary malignant neoplasm of other specified sites: Secondary | ICD-10-CM | POA: Diagnosis not present

## 2020-12-05 DIAGNOSIS — E46 Unspecified protein-calorie malnutrition: Secondary | ICD-10-CM | POA: Diagnosis not present

## 2020-12-05 DIAGNOSIS — Z7984 Long term (current) use of oral hypoglycemic drugs: Secondary | ICD-10-CM | POA: Diagnosis not present

## 2020-12-05 DIAGNOSIS — Z483 Aftercare following surgery for neoplasm: Secondary | ICD-10-CM | POA: Diagnosis not present

## 2020-12-05 DIAGNOSIS — I10 Essential (primary) hypertension: Secondary | ICD-10-CM | POA: Diagnosis not present

## 2020-12-08 DIAGNOSIS — Z7982 Long term (current) use of aspirin: Secondary | ICD-10-CM | POA: Diagnosis not present

## 2020-12-08 DIAGNOSIS — Z7901 Long term (current) use of anticoagulants: Secondary | ICD-10-CM | POA: Diagnosis not present

## 2020-12-08 DIAGNOSIS — Z87891 Personal history of nicotine dependence: Secondary | ICD-10-CM | POA: Diagnosis not present

## 2020-12-08 DIAGNOSIS — Z8521 Personal history of malignant neoplasm of larynx: Secondary | ICD-10-CM | POA: Diagnosis not present

## 2020-12-08 DIAGNOSIS — C7989 Secondary malignant neoplasm of other specified sites: Secondary | ICD-10-CM | POA: Diagnosis not present

## 2020-12-08 DIAGNOSIS — I10 Essential (primary) hypertension: Secondary | ICD-10-CM | POA: Diagnosis not present

## 2020-12-08 DIAGNOSIS — Z7984 Long term (current) use of oral hypoglycemic drugs: Secondary | ICD-10-CM | POA: Diagnosis not present

## 2020-12-08 DIAGNOSIS — Z79899 Other long term (current) drug therapy: Secondary | ICD-10-CM | POA: Diagnosis not present

## 2020-12-08 DIAGNOSIS — E119 Type 2 diabetes mellitus without complications: Secondary | ICD-10-CM | POA: Diagnosis not present

## 2020-12-08 DIAGNOSIS — Z448 Encounter for fitting and adjustment of other external prosthetic devices: Secondary | ICD-10-CM | POA: Diagnosis not present

## 2020-12-11 DIAGNOSIS — Z7982 Long term (current) use of aspirin: Secondary | ICD-10-CM | POA: Diagnosis not present

## 2020-12-11 DIAGNOSIS — Z7984 Long term (current) use of oral hypoglycemic drugs: Secondary | ICD-10-CM | POA: Diagnosis not present

## 2020-12-11 DIAGNOSIS — E119 Type 2 diabetes mellitus without complications: Secondary | ICD-10-CM | POA: Diagnosis not present

## 2020-12-11 DIAGNOSIS — Z483 Aftercare following surgery for neoplasm: Secondary | ICD-10-CM | POA: Diagnosis not present

## 2020-12-11 DIAGNOSIS — Z431 Encounter for attention to gastrostomy: Secondary | ICD-10-CM | POA: Diagnosis not present

## 2020-12-11 DIAGNOSIS — E46 Unspecified protein-calorie malnutrition: Secondary | ICD-10-CM | POA: Diagnosis not present

## 2020-12-11 DIAGNOSIS — E785 Hyperlipidemia, unspecified: Secondary | ICD-10-CM | POA: Diagnosis not present

## 2020-12-11 DIAGNOSIS — I10 Essential (primary) hypertension: Secondary | ICD-10-CM | POA: Diagnosis not present

## 2020-12-11 DIAGNOSIS — C7989 Secondary malignant neoplasm of other specified sites: Secondary | ICD-10-CM | POA: Diagnosis not present

## 2020-12-12 DIAGNOSIS — Z431 Encounter for attention to gastrostomy: Secondary | ICD-10-CM | POA: Diagnosis not present

## 2020-12-12 DIAGNOSIS — Z7982 Long term (current) use of aspirin: Secondary | ICD-10-CM | POA: Diagnosis not present

## 2020-12-12 DIAGNOSIS — E785 Hyperlipidemia, unspecified: Secondary | ICD-10-CM | POA: Diagnosis not present

## 2020-12-12 DIAGNOSIS — E46 Unspecified protein-calorie malnutrition: Secondary | ICD-10-CM | POA: Diagnosis not present

## 2020-12-12 DIAGNOSIS — Z483 Aftercare following surgery for neoplasm: Secondary | ICD-10-CM | POA: Diagnosis not present

## 2020-12-12 DIAGNOSIS — Z7984 Long term (current) use of oral hypoglycemic drugs: Secondary | ICD-10-CM | POA: Diagnosis not present

## 2020-12-12 DIAGNOSIS — I10 Essential (primary) hypertension: Secondary | ICD-10-CM | POA: Diagnosis not present

## 2020-12-12 DIAGNOSIS — C7989 Secondary malignant neoplasm of other specified sites: Secondary | ICD-10-CM | POA: Diagnosis not present

## 2020-12-12 DIAGNOSIS — E119 Type 2 diabetes mellitus without complications: Secondary | ICD-10-CM | POA: Diagnosis not present

## 2020-12-15 DIAGNOSIS — Z430 Encounter for attention to tracheostomy: Secondary | ICD-10-CM | POA: Diagnosis not present

## 2020-12-15 DIAGNOSIS — Z9002 Acquired absence of larynx: Secondary | ICD-10-CM | POA: Diagnosis not present

## 2020-12-15 DIAGNOSIS — Z87891 Personal history of nicotine dependence: Secondary | ICD-10-CM | POA: Diagnosis not present

## 2020-12-15 DIAGNOSIS — Z510 Encounter for antineoplastic radiation therapy: Secondary | ICD-10-CM | POA: Diagnosis not present

## 2020-12-15 DIAGNOSIS — C7989 Secondary malignant neoplasm of other specified sites: Secondary | ICD-10-CM | POA: Diagnosis not present

## 2020-12-18 ENCOUNTER — Telehealth (INDEPENDENT_AMBULATORY_CARE_PROVIDER_SITE_OTHER): Payer: Medicare HMO | Admitting: Family Medicine

## 2020-12-18 ENCOUNTER — Encounter: Payer: Self-pay | Admitting: Family Medicine

## 2020-12-18 VITALS — BP 108/81 | HR 84 | Ht 68.0 in | Wt 110.0 lb

## 2020-12-18 DIAGNOSIS — E46 Unspecified protein-calorie malnutrition: Secondary | ICD-10-CM | POA: Diagnosis not present

## 2020-12-18 DIAGNOSIS — R351 Nocturia: Secondary | ICD-10-CM

## 2020-12-18 DIAGNOSIS — Z483 Aftercare following surgery for neoplasm: Secondary | ICD-10-CM | POA: Diagnosis not present

## 2020-12-18 DIAGNOSIS — I10 Essential (primary) hypertension: Secondary | ICD-10-CM | POA: Diagnosis not present

## 2020-12-18 DIAGNOSIS — E785 Hyperlipidemia, unspecified: Secondary | ICD-10-CM | POA: Diagnosis not present

## 2020-12-18 DIAGNOSIS — E119 Type 2 diabetes mellitus without complications: Secondary | ICD-10-CM | POA: Diagnosis not present

## 2020-12-18 DIAGNOSIS — R350 Frequency of micturition: Secondary | ICD-10-CM | POA: Diagnosis not present

## 2020-12-18 DIAGNOSIS — C7989 Secondary malignant neoplasm of other specified sites: Secondary | ICD-10-CM | POA: Diagnosis not present

## 2020-12-18 DIAGNOSIS — Z7982 Long term (current) use of aspirin: Secondary | ICD-10-CM | POA: Diagnosis not present

## 2020-12-18 DIAGNOSIS — Z7984 Long term (current) use of oral hypoglycemic drugs: Secondary | ICD-10-CM | POA: Diagnosis not present

## 2020-12-18 DIAGNOSIS — Z431 Encounter for attention to gastrostomy: Secondary | ICD-10-CM | POA: Diagnosis not present

## 2020-12-18 DIAGNOSIS — R32 Unspecified urinary incontinence: Secondary | ICD-10-CM

## 2020-12-18 NOTE — Patient Instructions (Signed)
Urinary Frequency, Adult Urinary frequency means urinating more often than usual. You may urinate every 1-2 hours even though you drink a normal amount of fluid and do not have a bladder infection or condition. Although you urinate more often than normal,the total amount of urine produced in a day is normal. With urinary frequency, you may have an urgent need to urinate often. The stress and anxiety of needing to find a bathroom quickly can make this urge worse. This condition may go away on its own or you may need treatment at home. Home treatment may include bladder training, exercises, taking medicines, ormaking changes to your diet. Follow these instructions at home: Bladder health  Keep a bladder diary if told by your health care provider. Keep track of: What you eat and drink. How often you urinate. How much you urinate. Follow a bladder training program if told by your health care provider. This may include: Learning to delay going to the bathroom. Double urinating (voiding). This helps if you are not completely emptying your bladder. Scheduled voiding. Do Kegel exercises as told by your health care provider. Kegel exercises strengthen the muscles that help control urination, which may help the condition.  Eating and drinking If told by your health care provider, make diet changes, such as: Avoiding caffeine. Drinking fewer fluids, especially alcohol. Not drinking in the evening. Avoiding foods or drinks that may irritate the bladder. These include coffee, tea, soda, artificial sweeteners, citrus, tomato-based foods, and chocolate. Eating foods that help prevent or ease constipation. Constipation can make this condition worse. Your health care provider may recommend that you: Drink enough fluid to keep your urine pale yellow. Take over-the-counter or prescription medicines. Eat foods that are high in fiber, such as beans, whole grains, and fresh fruits and vegetables. Limit foods  that are high in fat and processed sugars, such as fried or sweet foods. General instructions Take over-the-counter and prescription medicines only as told by your health care provider. Keep all follow-up visits as told by your health care provider. This is important. Contact a health care provider if: You start urinating more often. You feel pain or irritation when you urinate. You notice blood in your urine. Your urine looks cloudy. You develop a fever. You begin vomiting. Get help right away if: You are unable to urinate. Summary Urinary frequency means urinating more often than usual. With urinary frequency, you may urinate every 1-2 hours even though you drink a normal amount of fluid and do not have a bladder infection or other bladder condition. Your health care provider may recommend that you keep a bladder diary, follow a bladder training program, or make dietary changes. If told by your health care provider, do Kegel exercises to strengthen the muscles that help control urination. Take over-the-counter and prescription medicines only as told by your health care provider. Contact a health care provider if your symptoms do not improve or get worse. This information is not intended to replace advice given to you by your health care provider. Make sure you discuss any questions you have with your healthcare provider. Document Revised: 02/02/2018 Document Reviewed: 02/02/2018 Elsevier Patient Education  2021 Elsevier Inc.  

## 2020-12-18 NOTE — Progress Notes (Addendum)
Name: Connor Aguilar   MRN: [id number]    DOB: 1941-01-13   Date:12/18/2020       Progress Note  Subjective:   Chief Complaint  Chief Complaint  Patient presents with   Urinary Frequency    I connected with  Connor Aguilar on 12/18/20 at  8:40 AM EDT by telephone and verified that I am speaking with the correct person using two identifiers.   I discussed the limitations, risks, security and privacy concerns of performing an evaluation and management service by telephone and the availability of in person appointments. Staff also discussed with the patient that there may be a patient responsible charge related to this service.  Patient verbalized understanding and agreed to proceed with encounter. Patient Location: home Provider Location:  [facility] Additional Individuals present:   HPI  Urinary frequency and incontinence x 4 weeks Nocturia 4-5 x a night and interfering with his sleep  IPSS     Row Name 12/18/20 0814         International Prostate Symptom Score   How often have you had the sensation of not emptying your bladder? Almost always     How often have you had to urinate less than every two hours? About half the time     How often have you found you stopped and started again several times when you urinated? Almost always     How often have you found it difficult to postpone urination? Almost always     How often have you had a weak urinary stream? Less than 1 in 5 times     How often have you had to strain to start urination? Less than 1 in 5 times     How many times did you typically get up at night to urinate? 5 Times     Total IPSS Score 25           Quality of Life due to urinary symptoms   If you were to spend the rest of your life with your urinary condition just the way it is now how would you feel about that? Unhappy             Doing tube feeding - ensure + therapeutic nutrition On metformin and stopped [place] Weight 110 up to 114 last Monday  Gatorade  zero through tube, she counts Oz of water feeds - daily around 64 oz    Patient Active Problem List   Diagnosis Date Noted   Moderate protein-calorie malnutrition ([place]) 11/26/2020   Hypokalemia 10/09/2020   Metastatic squamous cell carcinoma to head and neck ([place]) 08/22/2020   Goals of care, counseling/discussion 08/19/2020   Laryngeal cancer ([place]) 08/19/2020   Chronic arthropathy 10/25/2019   Onychomycosis of multiple toenails with type 2 diabetes mellitus ([place]) 05/16/2018   Erectile dysfunction 08/19/2015   Type 2 diabetes mellitus without complication ([place]) 40/98/1191   Tobacco abuse 04/09/2015   Hyperlipidemia 04/02/2015   Hypertension 04/02/2015   Panlobular emphysema ([place]) 04/02/2015    Social History   Tobacco Use   Smoking status: Current Every Day Smoker    Packs/day: 1.50    Years: 64.00    Pack years: 96.00    Types: Cigarettes   Smokeless tobacco: Never Used  Substance Use Topics   Alcohol use: No    Alcohol/week: 0.0 standard drinks     Current Outpatient Medications:    acetaminophen (TYLENOL) 325 MG tablet, Take 2 tablets (650 mg total) by mouth every 6 (six) hours  as needed for mild pain (or Fever >/= 101)., Disp: , Rfl:    apixaban (ELIQUIS) 5 MG TABS tablet, Take by mouth., Disp: , Rfl:    aspirin 81 MG chewable tablet, Chew by mouth., Disp: , Rfl:    blood glucose meter kit and supplies, Accuchek or whatever is covered by insurance; check sugars once a day only if desired; Dx E11.65, LON 99 months, Disp: 1 each, Rfl: 0   gabapentin (NEURONTIN) 300 MG capsule, Take by mouth., Disp: , Rfl:    glucose blood test strip, Use as instructed to check sugars once a day if desired; LON 99 months, Dx E11.65, Disp: 100 each, Rfl: 3   magnesium oxide (MAG-OX) 400 MG tablet, Take by mouth., Disp: , Rfl:    metFORMIN (GLUCOPHAGE) 1000 MG tablet, Take 1 tablet (1,000 mg total) by mouth daily with breakfast. (Patient taking differently: Take 1,000 mg by mouth daily.),  Disp: 180 tablet, Rfl: 3   mirtazapine (REMERON) 15 MG tablet, Take by mouth., Disp: , Rfl:    Morphine Sulfate (MORPHINE CONCENTRATE) 10 mg / 0.5 ml concentrated solution, Take 0.25 mLs (5 mg total) by mouth every 4 (four) hours as needed for severe pain., Disp: 30 mL, Rfl: 0   oxyCODONE (ROXICODONE) 5 MG/5ML solution, , Disp: , Rfl:    Oxycodone HCl 10 MG TABS, Take 1 tablet by mouth every 3-4 hours as needed for pain, Disp: , Rfl:    pantoprazole (PROTONIX) 40 MG tablet, Take 1 tablet (40 mg total) by mouth daily., Disp: 30 tablet, Rfl: 3   potassium chloride SA (KLOR-CON) 20 MEQ tablet, , Disp: , Rfl:    prochlorperazine (COMPAZINE) 10 MG tablet, , Disp: , Rfl:    rosuvastatin (CRESTOR) 5 MG tablet, TAKE ONE TABLET EVERY DAY AT BEDTIME, Disp: 90 tablet, Rfl: 3   sulfamethoxazole-trimethoprim (BACTRIM DS) 800-160 MG tablet, , Disp: , Rfl:    amLODipine (NORVASC) 5 MG tablet, TAKE (1) TABLET BY MOUTH EVERY DAY (Patient not taking: No sig reported), Disp: 90 tablet, Rfl: 3  No Known Allergies  Chart Review: I personally reviewed active problem list, medication list, allergies, family history, social history, health maintenance, notes from last encounter, lab results, imaging with the patient/caregiver today.   Review of Systems 10 Systems reviewed and are negative for acute change except as noted in the HPI.   Objective:    Virtual encounter, vitals limited, only able to obtain the following Today's Vitals   12/18/20 0821  BP: 108/81  Pulse: 84  Weight: 110 lb (49.9 kg)  Height: _0  (1.727 m)   Body mass index is 16.73 kg/m. Nursing Note and Vital Signs reviewed.  Physical Exam Pt alert, nurse not and VS reviewed PE limited by telephone encounter  No results found for this or any previous visit (from the past 72 hour(s)).  Assessment and Plan:     ICD-10-CM   1. Urinary frequency  R35.0 Urinalysis, Routine w reflex microscopic    Urine Culture  2. Urinary  incontinence, unspecified type  R32 Urinalysis, Routine w reflex microscopic    Urine Culture  3. Nocturia  R35.1 Urinalysis, Routine w reflex microscopic    Urine Culture  4. Type 2 diabetes mellitus without complication, without long-term current use of insulin [facility])  E11.9   May need urology consult and to f/up with oncology team to see if sx could be from any recent CA treatment/meds I will treat any possible UTI once urine is returned and  resulted  -Red flags and when to present for emergency care or RTC including but not limited to new/worsening/un-resolving symptoms, reviewed with patient at time of visit. Follow up and care instructions discussed and provided in AVS. - I discussed the assessment and treatment plan with the patient. The patient was provided an opportunity to ask questions and all were answered. The patient agreed with the plan and demonstrated an understanding of the instructions.  - The patient was advised to call back or seek an in-person evaluation if the symptoms worsen or if the condition fails to improve as anticipated.  I provided 15+ minutes of non-face-to-face time during this encounter   Delsa Grana, PA-C 12/18/20 8:51 AM

## 2020-12-19 DIAGNOSIS — E785 Hyperlipidemia, unspecified: Secondary | ICD-10-CM | POA: Diagnosis not present

## 2020-12-19 DIAGNOSIS — R32 Unspecified urinary incontinence: Secondary | ICD-10-CM | POA: Diagnosis not present

## 2020-12-19 DIAGNOSIS — R350 Frequency of micturition: Secondary | ICD-10-CM | POA: Diagnosis not present

## 2020-12-19 DIAGNOSIS — R351 Nocturia: Secondary | ICD-10-CM | POA: Diagnosis not present

## 2020-12-19 DIAGNOSIS — I10 Essential (primary) hypertension: Secondary | ICD-10-CM | POA: Diagnosis not present

## 2020-12-19 DIAGNOSIS — Z7982 Long term (current) use of aspirin: Secondary | ICD-10-CM | POA: Diagnosis not present

## 2020-12-19 DIAGNOSIS — E46 Unspecified protein-calorie malnutrition: Secondary | ICD-10-CM | POA: Diagnosis not present

## 2020-12-19 DIAGNOSIS — C7989 Secondary malignant neoplasm of other specified sites: Secondary | ICD-10-CM | POA: Diagnosis not present

## 2020-12-19 DIAGNOSIS — Z431 Encounter for attention to gastrostomy: Secondary | ICD-10-CM | POA: Diagnosis not present

## 2020-12-19 DIAGNOSIS — Z7984 Long term (current) use of oral hypoglycemic drugs: Secondary | ICD-10-CM | POA: Diagnosis not present

## 2020-12-19 DIAGNOSIS — E119 Type 2 diabetes mellitus without complications: Secondary | ICD-10-CM | POA: Diagnosis not present

## 2020-12-19 DIAGNOSIS — Z483 Aftercare following surgery for neoplasm: Secondary | ICD-10-CM | POA: Diagnosis not present

## 2020-12-21 LAB — URINALYSIS, ROUTINE W REFLEX MICROSCOPIC
Bilirubin Urine: NEGATIVE
Glucose, UA: NEGATIVE
Hgb urine dipstick: NEGATIVE
Ketones, ur: NEGATIVE
Leukocytes,Ua: NEGATIVE
Nitrite: NEGATIVE
Protein, ur: NEGATIVE
Specific Gravity, Urine: 1.014 (ref 1.001–1.035)
pH: 8.5 — ABNORMAL HIGH (ref 5.0–8.0)

## 2020-12-21 LAB — URINE CULTURE
MICRO NUMBER:: 11892337
SPECIMEN QUALITY:: ADEQUATE

## 2020-12-22 ENCOUNTER — Encounter: Payer: Self-pay | Admitting: Family Medicine

## 2020-12-22 DIAGNOSIS — J811 Chronic pulmonary edema: Secondary | ICD-10-CM | POA: Diagnosis not present

## 2020-12-22 DIAGNOSIS — E43 Unspecified severe protein-calorie malnutrition: Secondary | ICD-10-CM | POA: Diagnosis not present

## 2020-12-22 DIAGNOSIS — E871 Hypo-osmolality and hyponatremia: Secondary | ICD-10-CM | POA: Insufficient documentation

## 2020-12-22 DIAGNOSIS — E119 Type 2 diabetes mellitus without complications: Secondary | ICD-10-CM | POA: Diagnosis not present

## 2020-12-22 DIAGNOSIS — R0902 Hypoxemia: Secondary | ICD-10-CM | POA: Diagnosis not present

## 2020-12-22 DIAGNOSIS — Z515 Encounter for palliative care: Secondary | ICD-10-CM | POA: Diagnosis not present

## 2020-12-22 DIAGNOSIS — R531 Weakness: Secondary | ICD-10-CM | POA: Insufficient documentation

## 2020-12-22 DIAGNOSIS — R5383 Other fatigue: Secondary | ICD-10-CM | POA: Diagnosis not present

## 2020-12-22 DIAGNOSIS — C779 Secondary and unspecified malignant neoplasm of lymph node, unspecified: Secondary | ICD-10-CM | POA: Diagnosis not present

## 2020-12-22 DIAGNOSIS — Z931 Gastrostomy status: Secondary | ICD-10-CM | POA: Insufficient documentation

## 2020-12-22 DIAGNOSIS — C760 Malignant neoplasm of head, face and neck: Secondary | ICD-10-CM | POA: Diagnosis not present

## 2020-12-22 DIAGNOSIS — Z9002 Acquired absence of larynx: Secondary | ICD-10-CM | POA: Insufficient documentation

## 2020-12-22 DIAGNOSIS — Z681 Body mass index (BMI) 19 or less, adult: Secondary | ICD-10-CM | POA: Diagnosis not present

## 2020-12-22 DIAGNOSIS — M6281 Muscle weakness (generalized): Secondary | ICD-10-CM | POA: Diagnosis not present

## 2020-12-22 DIAGNOSIS — R3915 Urgency of urination: Secondary | ICD-10-CM | POA: Diagnosis not present

## 2020-12-22 DIAGNOSIS — I10 Essential (primary) hypertension: Secondary | ICD-10-CM | POA: Diagnosis not present

## 2020-12-22 DIAGNOSIS — R197 Diarrhea, unspecified: Secondary | ICD-10-CM | POA: Diagnosis not present

## 2020-12-22 DIAGNOSIS — Z20822 Contact with and (suspected) exposure to covid-19: Secondary | ICD-10-CM | POA: Diagnosis not present

## 2020-12-23 ENCOUNTER — Ambulatory Visit: Payer: Medicare HMO | Admitting: Podiatry

## 2020-12-23 ENCOUNTER — Telehealth: Payer: Self-pay

## 2020-12-23 DIAGNOSIS — E119 Type 2 diabetes mellitus without complications: Secondary | ICD-10-CM | POA: Diagnosis not present

## 2020-12-23 DIAGNOSIS — R531 Weakness: Secondary | ICD-10-CM | POA: Diagnosis not present

## 2020-12-23 DIAGNOSIS — R0902 Hypoxemia: Secondary | ICD-10-CM | POA: Diagnosis not present

## 2020-12-23 DIAGNOSIS — S63061A Subluxation of metacarpal (bone), proximal end of right hand, initial encounter: Secondary | ICD-10-CM | POA: Diagnosis not present

## 2020-12-23 DIAGNOSIS — E871 Hypo-osmolality and hyponatremia: Secondary | ICD-10-CM | POA: Diagnosis not present

## 2020-12-23 NOTE — Telephone Encounter (Signed)
Copied from [place] [id number]. Topic: General - Other >> Dec 23, 2020  9:14 AM Oneta Rack wrote: Patient is currently admitted at [facility] and hold off on referral to urologist until the game plane. Best # April [phone]

## 2020-12-23 NOTE — Telephone Encounter (Signed)
-----   Message from Delsa Grana, [place] sent at 12/22/2020  3:35 PM EDT ----- Urine culture was negative - we can refer to urology locally for further eval  Please put in urology referral for urinary frequency and urinary incontinence - [facility] please

## 2020-12-24 DIAGNOSIS — R0902 Hypoxemia: Secondary | ICD-10-CM | POA: Diagnosis not present

## 2020-12-24 DIAGNOSIS — K5900 Constipation, unspecified: Secondary | ICD-10-CM | POA: Diagnosis not present

## 2020-12-24 DIAGNOSIS — R531 Weakness: Secondary | ICD-10-CM | POA: Diagnosis not present

## 2020-12-24 DIAGNOSIS — E119 Type 2 diabetes mellitus without complications: Secondary | ICD-10-CM | POA: Diagnosis not present

## 2020-12-24 DIAGNOSIS — E871 Hypo-osmolality and hyponatremia: Secondary | ICD-10-CM | POA: Diagnosis not present

## 2020-12-24 DIAGNOSIS — Z515 Encounter for palliative care: Secondary | ICD-10-CM | POA: Diagnosis not present

## 2020-12-24 DIAGNOSIS — F32A Depression, unspecified: Secondary | ICD-10-CM | POA: Diagnosis not present

## 2020-12-24 DIAGNOSIS — E43 Unspecified severe protein-calorie malnutrition: Secondary | ICD-10-CM | POA: Diagnosis not present

## 2020-12-25 DIAGNOSIS — F32A Depression, unspecified: Secondary | ICD-10-CM | POA: Diagnosis not present

## 2020-12-25 DIAGNOSIS — Z515 Encounter for palliative care: Secondary | ICD-10-CM | POA: Diagnosis not present

## 2020-12-25 DIAGNOSIS — E43 Unspecified severe protein-calorie malnutrition: Secondary | ICD-10-CM | POA: Diagnosis not present

## 2020-12-25 DIAGNOSIS — K5900 Constipation, unspecified: Secondary | ICD-10-CM | POA: Diagnosis not present

## 2020-12-25 DIAGNOSIS — E119 Type 2 diabetes mellitus without complications: Secondary | ICD-10-CM | POA: Diagnosis not present

## 2020-12-25 DIAGNOSIS — Z931 Gastrostomy status: Secondary | ICD-10-CM | POA: Diagnosis not present

## 2020-12-25 DIAGNOSIS — R531 Weakness: Secondary | ICD-10-CM | POA: Diagnosis not present

## 2020-12-25 DIAGNOSIS — R0902 Hypoxemia: Secondary | ICD-10-CM | POA: Diagnosis not present

## 2020-12-25 DIAGNOSIS — E871 Hypo-osmolality and hyponatremia: Secondary | ICD-10-CM | POA: Diagnosis not present

## 2020-12-26 DIAGNOSIS — E871 Hypo-osmolality and hyponatremia: Secondary | ICD-10-CM | POA: Diagnosis not present

## 2020-12-26 DIAGNOSIS — C760 Malignant neoplasm of head, face and neck: Secondary | ICD-10-CM | POA: Diagnosis not present

## 2020-12-26 DIAGNOSIS — E785 Hyperlipidemia, unspecified: Secondary | ICD-10-CM | POA: Diagnosis not present

## 2020-12-26 DIAGNOSIS — R531 Weakness: Secondary | ICD-10-CM | POA: Diagnosis not present

## 2020-12-26 DIAGNOSIS — Z79899 Other long term (current) drug therapy: Secondary | ICD-10-CM | POA: Diagnosis not present

## 2020-12-26 DIAGNOSIS — R2681 Unsteadiness on feet: Secondary | ICD-10-CM | POA: Diagnosis not present

## 2020-12-26 DIAGNOSIS — Z681 Body mass index (BMI) 19 or less, adult: Secondary | ICD-10-CM | POA: Diagnosis not present

## 2020-12-26 DIAGNOSIS — C4442 Squamous cell carcinoma of skin of scalp and neck: Secondary | ICD-10-CM | POA: Diagnosis not present

## 2020-12-26 DIAGNOSIS — I10 Essential (primary) hypertension: Secondary | ICD-10-CM | POA: Diagnosis not present

## 2020-12-26 DIAGNOSIS — R491 Aphonia: Secondary | ICD-10-CM | POA: Diagnosis not present

## 2020-12-26 DIAGNOSIS — E43 Unspecified severe protein-calorie malnutrition: Secondary | ICD-10-CM | POA: Diagnosis not present

## 2020-12-26 DIAGNOSIS — Z931 Gastrostomy status: Secondary | ICD-10-CM | POA: Diagnosis not present

## 2020-12-26 DIAGNOSIS — Z515 Encounter for palliative care: Secondary | ICD-10-CM | POA: Diagnosis not present

## 2020-12-26 DIAGNOSIS — R630 Anorexia: Secondary | ICD-10-CM | POA: Diagnosis not present

## 2020-12-26 DIAGNOSIS — E1140 Type 2 diabetes mellitus with diabetic neuropathy, unspecified: Secondary | ICD-10-CM | POA: Diagnosis not present

## 2020-12-26 DIAGNOSIS — Z20822 Contact with and (suspected) exposure to covid-19: Secondary | ICD-10-CM | POA: Diagnosis not present

## 2020-12-26 DIAGNOSIS — Z930 Tracheostomy status: Secondary | ICD-10-CM | POA: Diagnosis not present

## 2020-12-26 DIAGNOSIS — J9601 Acute respiratory failure with hypoxia: Secondary | ICD-10-CM | POA: Diagnosis not present

## 2020-12-26 DIAGNOSIS — R279 Unspecified lack of coordination: Secondary | ICD-10-CM | POA: Diagnosis not present

## 2020-12-26 DIAGNOSIS — M6281 Muscle weakness (generalized): Secondary | ICD-10-CM | POA: Diagnosis not present

## 2020-12-26 DIAGNOSIS — R5381 Other malaise: Secondary | ICD-10-CM | POA: Diagnosis not present

## 2020-12-26 DIAGNOSIS — G894 Chronic pain syndrome: Secondary | ICD-10-CM | POA: Diagnosis not present

## 2020-12-26 DIAGNOSIS — E119 Type 2 diabetes mellitus without complications: Secondary | ICD-10-CM | POA: Diagnosis not present

## 2020-12-26 DIAGNOSIS — C779 Secondary and unspecified malignant neoplasm of lymph node, unspecified: Secondary | ICD-10-CM | POA: Diagnosis not present

## 2020-12-26 DIAGNOSIS — C7989 Secondary malignant neoplasm of other specified sites: Secondary | ICD-10-CM | POA: Diagnosis not present

## 2020-12-26 DIAGNOSIS — F330 Major depressive disorder, recurrent, mild: Secondary | ICD-10-CM | POA: Diagnosis not present

## 2020-12-26 DIAGNOSIS — F32A Depression, unspecified: Secondary | ICD-10-CM | POA: Diagnosis not present

## 2020-12-26 DIAGNOSIS — Z430 Encounter for attention to tracheostomy: Secondary | ICD-10-CM | POA: Diagnosis not present

## 2020-12-26 DIAGNOSIS — R0902 Hypoxemia: Secondary | ICD-10-CM | POA: Diagnosis not present

## 2020-12-29 DIAGNOSIS — I10 Essential (primary) hypertension: Secondary | ICD-10-CM | POA: Diagnosis not present

## 2020-12-29 DIAGNOSIS — F330 Major depressive disorder, recurrent, mild: Secondary | ICD-10-CM | POA: Diagnosis not present

## 2020-12-29 DIAGNOSIS — Z930 Tracheostomy status: Secondary | ICD-10-CM | POA: Diagnosis not present

## 2020-12-29 DIAGNOSIS — G894 Chronic pain syndrome: Secondary | ICD-10-CM | POA: Diagnosis not present

## 2021-01-05 DIAGNOSIS — G894 Chronic pain syndrome: Secondary | ICD-10-CM | POA: Diagnosis not present

## 2021-01-05 DIAGNOSIS — F330 Major depressive disorder, recurrent, mild: Secondary | ICD-10-CM | POA: Diagnosis not present

## 2021-01-05 DIAGNOSIS — R630 Anorexia: Secondary | ICD-10-CM | POA: Diagnosis not present

## 2021-01-05 DIAGNOSIS — Z930 Tracheostomy status: Secondary | ICD-10-CM | POA: Diagnosis not present

## 2021-01-08 ENCOUNTER — Telehealth: Payer: Medicare HMO | Admitting: Family Medicine

## 2021-01-11 DIAGNOSIS — R000 Tachycardia, unspecified: Secondary | ICD-10-CM | POA: Diagnosis not present

## 2021-01-11 DIAGNOSIS — Z681 Body mass index (BMI) 19 or less, adult: Secondary | ICD-10-CM | POA: Diagnosis not present

## 2021-01-11 DIAGNOSIS — R410 Disorientation, unspecified: Secondary | ICD-10-CM | POA: Diagnosis not present

## 2021-01-11 DIAGNOSIS — J15212 Pneumonia due to Methicillin resistant Staphylococcus aureus: Secondary | ICD-10-CM | POA: Diagnosis not present

## 2021-01-11 DIAGNOSIS — Z66 Do not resuscitate: Secondary | ICD-10-CM | POA: Diagnosis not present

## 2021-01-11 DIAGNOSIS — R2241 Localized swelling, mass and lump, right lower limb: Secondary | ICD-10-CM | POA: Diagnosis not present

## 2021-01-11 DIAGNOSIS — D72829 Elevated white blood cell count, unspecified: Secondary | ICD-10-CM | POA: Diagnosis not present

## 2021-01-11 DIAGNOSIS — Z20822 Contact with and (suspected) exposure to covid-19: Secondary | ICD-10-CM | POA: Diagnosis not present

## 2021-01-11 DIAGNOSIS — F32A Depression, unspecified: Secondary | ICD-10-CM | POA: Diagnosis not present

## 2021-01-11 DIAGNOSIS — A4102 Sepsis due to Methicillin resistant Staphylococcus aureus: Secondary | ICD-10-CM | POA: Diagnosis not present

## 2021-01-11 DIAGNOSIS — R52 Pain, unspecified: Secondary | ICD-10-CM | POA: Diagnosis not present

## 2021-01-11 DIAGNOSIS — E43 Unspecified severe protein-calorie malnutrition: Secondary | ICD-10-CM | POA: Diagnosis not present

## 2021-01-11 DIAGNOSIS — A419 Sepsis, unspecified organism: Secondary | ICD-10-CM | POA: Diagnosis not present

## 2021-01-11 DIAGNOSIS — E1165 Type 2 diabetes mellitus with hyperglycemia: Secondary | ICD-10-CM | POA: Diagnosis not present

## 2021-01-11 DIAGNOSIS — L539 Erythematous condition, unspecified: Secondary | ICD-10-CM | POA: Diagnosis not present

## 2021-01-11 DIAGNOSIS — J9811 Atelectasis: Secondary | ICD-10-CM | POA: Diagnosis not present

## 2021-01-11 DIAGNOSIS — R4182 Altered mental status, unspecified: Secondary | ICD-10-CM | POA: Diagnosis not present

## 2021-01-11 DIAGNOSIS — R0682 Tachypnea, not elsewhere classified: Secondary | ICD-10-CM | POA: Diagnosis not present

## 2021-01-11 DIAGNOSIS — R0989 Other specified symptoms and signs involving the circulatory and respiratory systems: Secondary | ICD-10-CM | POA: Diagnosis not present

## 2021-01-11 DIAGNOSIS — E871 Hypo-osmolality and hyponatremia: Secondary | ICD-10-CM | POA: Diagnosis not present

## 2021-01-11 DIAGNOSIS — R7402 Elevation of levels of lactic acid dehydrogenase (LDH): Secondary | ICD-10-CM | POA: Diagnosis not present

## 2021-01-11 DIAGNOSIS — R509 Fever, unspecified: Secondary | ICD-10-CM | POA: Diagnosis not present

## 2021-01-11 DIAGNOSIS — R404 Transient alteration of awareness: Secondary | ICD-10-CM | POA: Diagnosis not present

## 2021-01-11 DIAGNOSIS — D649 Anemia, unspecified: Secondary | ICD-10-CM | POA: Diagnosis not present

## 2021-01-11 DIAGNOSIS — Z743 Need for continuous supervision: Secondary | ICD-10-CM | POA: Diagnosis not present

## 2021-01-11 DIAGNOSIS — F05 Delirium due to known physiological condition: Secondary | ICD-10-CM | POA: Diagnosis not present

## 2021-01-11 DIAGNOSIS — R0902 Hypoxemia: Secondary | ICD-10-CM | POA: Diagnosis not present

## 2021-01-11 DIAGNOSIS — E46 Unspecified protein-calorie malnutrition: Secondary | ICD-10-CM | POA: Diagnosis not present

## 2021-01-11 DIAGNOSIS — Z8659 Personal history of other mental and behavioral disorders: Secondary | ICD-10-CM | POA: Diagnosis not present

## 2021-01-12 ENCOUNTER — Telehealth: Payer: Self-pay | Admitting: Family Medicine

## 2021-01-12 DIAGNOSIS — Z8659 Personal history of other mental and behavioral disorders: Secondary | ICD-10-CM | POA: Diagnosis not present

## 2021-01-12 DIAGNOSIS — R410 Disorientation, unspecified: Secondary | ICD-10-CM | POA: Diagnosis not present

## 2021-01-12 NOTE — Telephone Encounter (Incomplete)
Home Health Verbal Orders - Caller/Agency: [place] Number: [phone] Requesting: Skilled Nursing: *** Frequency: 2w1, 1w2, 21month2

## 2021-01-12 NOTE — Telephone Encounter (Signed)
Verbal given 

## 2021-01-13 DIAGNOSIS — A419 Sepsis, unspecified organism: Secondary | ICD-10-CM | POA: Diagnosis not present

## 2021-01-14 ENCOUNTER — Telehealth: Payer: Self-pay | Admitting: Family Medicine

## 2021-01-14 NOTE — Telephone Encounter (Signed)
Called to inform the doctor that they can no longer see the patient because he is being seen by another agency, [facility].  Please advise and call if there are any questions at [phone]

## 2021-01-15 DIAGNOSIS — F39 Unspecified mood [affective] disorder: Secondary | ICD-10-CM | POA: Insufficient documentation

## 2021-01-20 ENCOUNTER — Telehealth: Payer: Self-pay | Admitting: Family Medicine

## 2021-01-20 NOTE — Telephone Encounter (Signed)
Home Health Verbal Orders - Caller/Agency: [place], [place] Number: [phone] Requesting OT/PT/Skilled Nursing/Social Work/Speech Therapy: Home Health eval - for nursing, PT, and OT. Eval and treat  Frequency:   They want to know if PCP will follow and sign

## 2021-01-21 ENCOUNTER — Ambulatory Visit: Payer: Medicare HMO | Admitting: Urology

## 2021-01-21 DIAGNOSIS — J189 Pneumonia, unspecified organism: Secondary | ICD-10-CM | POA: Diagnosis not present

## 2021-01-21 DIAGNOSIS — I10 Essential (primary) hypertension: Secondary | ICD-10-CM | POA: Diagnosis not present

## 2021-01-21 DIAGNOSIS — K5900 Constipation, unspecified: Secondary | ICD-10-CM | POA: Diagnosis not present

## 2021-01-21 DIAGNOSIS — Z930 Tracheostomy status: Secondary | ICD-10-CM | POA: Diagnosis not present

## 2021-01-21 DIAGNOSIS — F329 Major depressive disorder, single episode, unspecified: Secondary | ICD-10-CM | POA: Diagnosis not present

## 2021-01-21 DIAGNOSIS — C4442 Squamous cell carcinoma of skin of scalp and neck: Secondary | ICD-10-CM | POA: Diagnosis not present

## 2021-01-21 DIAGNOSIS — C7989 Secondary malignant neoplasm of other specified sites: Secondary | ICD-10-CM | POA: Diagnosis not present

## 2021-01-21 DIAGNOSIS — R197 Diarrhea, unspecified: Secondary | ICD-10-CM | POA: Diagnosis not present

## 2021-01-21 DIAGNOSIS — E43 Unspecified severe protein-calorie malnutrition: Secondary | ICD-10-CM | POA: Diagnosis not present

## 2021-01-21 DIAGNOSIS — Z515 Encounter for palliative care: Secondary | ICD-10-CM | POA: Diagnosis not present

## 2021-01-21 DIAGNOSIS — K2289 Other specified disease of esophagus: Secondary | ICD-10-CM | POA: Diagnosis not present

## 2021-01-21 DIAGNOSIS — Z430 Encounter for attention to tracheostomy: Secondary | ICD-10-CM | POA: Diagnosis not present

## 2021-01-21 DIAGNOSIS — E871 Hypo-osmolality and hyponatremia: Secondary | ICD-10-CM | POA: Diagnosis not present

## 2021-01-21 DIAGNOSIS — R0902 Hypoxemia: Secondary | ICD-10-CM | POA: Diagnosis not present

## 2021-01-21 DIAGNOSIS — Z20822 Contact with and (suspected) exposure to covid-19: Secondary | ICD-10-CM | POA: Diagnosis not present

## 2021-01-21 DIAGNOSIS — E119 Type 2 diabetes mellitus without complications: Secondary | ICD-10-CM | POA: Diagnosis not present

## 2021-01-21 DIAGNOSIS — Z681 Body mass index (BMI) 19 or less, adult: Secondary | ICD-10-CM | POA: Diagnosis not present

## 2021-01-21 DIAGNOSIS — K521 Toxic gastroenteritis and colitis: Secondary | ICD-10-CM | POA: Diagnosis not present

## 2021-01-21 DIAGNOSIS — Z931 Gastrostomy status: Secondary | ICD-10-CM | POA: Diagnosis not present

## 2021-01-21 DIAGNOSIS — J9601 Acute respiratory failure with hypoxia: Secondary | ICD-10-CM | POA: Diagnosis not present

## 2021-01-21 DIAGNOSIS — C329 Malignant neoplasm of larynx, unspecified: Secondary | ICD-10-CM | POA: Diagnosis not present

## 2021-01-21 DIAGNOSIS — F32A Depression, unspecified: Secondary | ICD-10-CM | POA: Diagnosis not present

## 2021-01-21 DIAGNOSIS — F330 Major depressive disorder, recurrent, mild: Secondary | ICD-10-CM | POA: Diagnosis not present

## 2021-01-21 DIAGNOSIS — E860 Dehydration: Secondary | ICD-10-CM | POA: Diagnosis not present

## 2021-01-21 DIAGNOSIS — G894 Chronic pain syndrome: Secondary | ICD-10-CM | POA: Diagnosis not present

## 2021-01-21 DIAGNOSIS — J431 Panlobular emphysema: Secondary | ICD-10-CM | POA: Diagnosis not present

## 2021-01-21 DIAGNOSIS — J690 Pneumonitis due to inhalation of food and vomit: Secondary | ICD-10-CM | POA: Diagnosis not present

## 2021-01-21 DIAGNOSIS — E039 Hypothyroidism, unspecified: Secondary | ICD-10-CM | POA: Diagnosis not present

## 2021-01-21 DIAGNOSIS — Z431 Encounter for attention to gastrostomy: Secondary | ICD-10-CM | POA: Diagnosis not present

## 2021-01-21 DIAGNOSIS — R918 Other nonspecific abnormal finding of lung field: Secondary | ICD-10-CM | POA: Diagnosis not present

## 2021-01-21 DIAGNOSIS — K449 Diaphragmatic hernia without obstruction or gangrene: Secondary | ICD-10-CM | POA: Diagnosis not present

## 2021-01-22 DIAGNOSIS — R0902 Hypoxemia: Secondary | ICD-10-CM | POA: Diagnosis not present

## 2021-01-22 DIAGNOSIS — R918 Other nonspecific abnormal finding of lung field: Secondary | ICD-10-CM | POA: Diagnosis not present

## 2021-01-22 NOTE — Telephone Encounter (Signed)
#   given in chart states not in service?

## 2021-01-23 ENCOUNTER — Encounter: Payer: Self-pay | Admitting: Urology

## 2021-01-23 DIAGNOSIS — E039 Hypothyroidism, unspecified: Secondary | ICD-10-CM | POA: Insufficient documentation

## 2021-01-26 ENCOUNTER — Telehealth: Payer: Self-pay | Admitting: Family Medicine

## 2021-01-26 NOTE — Telephone Encounter (Signed)
Attempted to reach out to [facility] at [facility]. Left voicemail awaiting return call for order specifics.

## 2021-01-26 NOTE — Telephone Encounter (Signed)
Best contact: [phone]   [facility] with [facility] called to see if PCP would sign for patient's orders, no orders specified. The patient is being discharged today.

## 2021-01-26 NOTE — Telephone Encounter (Signed)
We will need to do a HFU appt to be able to help review their admission notes, dx, pt needs and then to be able to put all the orders in

## 2021-01-26 NOTE — Telephone Encounter (Signed)
Connor Aguilar has returned call to practice  Connor Aguilar shares that the patient needs home health services   Patient was discharged today from [facility] 01/26/21  Skilled nursing, as well as physical and occupational therapy evaluations are requested for the patient

## 2021-01-27 ENCOUNTER — Telehealth: Payer: Self-pay

## 2021-01-27 DIAGNOSIS — C7989 Secondary malignant neoplasm of other specified sites: Secondary | ICD-10-CM | POA: Diagnosis not present

## 2021-01-27 DIAGNOSIS — Z430 Encounter for attention to tracheostomy: Secondary | ICD-10-CM | POA: Diagnosis not present

## 2021-01-27 DIAGNOSIS — R0902 Hypoxemia: Secondary | ICD-10-CM | POA: Diagnosis not present

## 2021-01-27 DIAGNOSIS — J690 Pneumonitis due to inhalation of food and vomit: Secondary | ICD-10-CM | POA: Diagnosis not present

## 2021-01-27 NOTE — Telephone Encounter (Signed)
VO given to CMA today - I do not know why he was recently hospitalized or the dx or needed care - appt scheduled Can sign off from PCP for VO for [facility] per [facility] orders for pts needs since they are managing most of his care/needs at this time with CA dx and tx

## 2021-01-27 NOTE — Telephone Encounter (Signed)
completed

## 2021-01-27 NOTE — Telephone Encounter (Signed)
Pt has an appt on 02/02/21

## 2021-01-27 NOTE — Telephone Encounter (Signed)
Spoke with Denisha with [place] to verify Kristeen Miss would be signing home heath orders/reviewing HH/PT/OT notes for Mr. [place] services that will begin 01/28/2021 following d/c from the hospital on 01/26/2021 due to aspiration pneumonia.

## 2021-01-27 NOTE — Telephone Encounter (Signed)
Copied from [place] [id number]. Topic: General - Other >> Jan 27, 2021 12:40 PM Leward Quan A wrote: Reason for CRM: Delorise Shiner called in asking to speak to Katharine Look the nurse in reference to patient asking to be called back at,  say that it have to do with home health orders.  Ph#  [phone]

## 2021-01-28 ENCOUNTER — Telehealth: Payer: Self-pay | Admitting: Family Medicine

## 2021-01-28 ENCOUNTER — Telehealth (INDEPENDENT_AMBULATORY_CARE_PROVIDER_SITE_OTHER): Payer: Medicare HMO | Admitting: Family Medicine

## 2021-01-28 ENCOUNTER — Encounter: Payer: Self-pay | Admitting: Family Medicine

## 2021-01-28 ENCOUNTER — Other Ambulatory Visit: Payer: Self-pay

## 2021-01-28 DIAGNOSIS — Z09 Encounter for follow-up examination after completed treatment for conditions other than malignant neoplasm: Secondary | ICD-10-CM

## 2021-01-28 DIAGNOSIS — E871 Hypo-osmolality and hyponatremia: Secondary | ICD-10-CM | POA: Diagnosis not present

## 2021-01-28 DIAGNOSIS — J431 Panlobular emphysema: Secondary | ICD-10-CM | POA: Diagnosis not present

## 2021-01-28 DIAGNOSIS — E890 Postprocedural hypothyroidism: Secondary | ICD-10-CM | POA: Diagnosis not present

## 2021-01-28 DIAGNOSIS — R1310 Dysphagia, unspecified: Secondary | ICD-10-CM | POA: Diagnosis not present

## 2021-01-28 DIAGNOSIS — C7989 Secondary malignant neoplasm of other specified sites: Secondary | ICD-10-CM | POA: Diagnosis not present

## 2021-01-28 DIAGNOSIS — J189 Pneumonia, unspecified organism: Secondary | ICD-10-CM | POA: Diagnosis not present

## 2021-01-28 DIAGNOSIS — R531 Weakness: Secondary | ICD-10-CM | POA: Diagnosis not present

## 2021-01-28 DIAGNOSIS — E43 Unspecified severe protein-calorie malnutrition: Secondary | ICD-10-CM | POA: Diagnosis not present

## 2021-01-28 DIAGNOSIS — C329 Malignant neoplasm of larynx, unspecified: Secondary | ICD-10-CM | POA: Diagnosis not present

## 2021-01-28 NOTE — Progress Notes (Signed)
Name: Connor Aguilar   MRN: [id number]    DOB: [birth year]   Date:01/28/2021       Progress Note  Subjective:    Chief Complaint  Chief Complaint  Patient presents with   Hospitalization Follow-up    I connected with  Connor Aguilar on 01/28/21  Did not connect to Connor Aguilar or staff on day of appt by telephone and verified that I am speaking with the correct person using two identifiers.   I discussed the limitations, risks, security and privacy concerns of performing an evaluation and management service by telephone and the availability of in person appointments. Staff also discussed with the patient that there may be a patient responsible charge related to this service.  Patient verbalized understanding and agreed to proceed with encounter. Patient Location:  Provider Location:  Additional Individuals present:   HPI Connor Aguilar is a 80 y.o. male with a PMHx of HTN, T2DM, malnutrition, metastatic laryngeal squamous cell cancer s/p laryngectomy/XRT/chemotherapy with tracheostomy and PEG that presented to [facility] with diarrhea and hypoxia found to have evidence of persistent vs recurrent pneumonia.  Since I last saw pt in clinic in person April 2022 pt has been to the ER and hospital multiple times at [facility] and we have not done HFU   Pt has been referred to [facility] and palliative care previously R/T cancer treatment and significant change in functional status/health etc  Following most recent admission at [facility] - 01/21/21 -01/26/21 - pt was referred to palliative care and [facility] again [facility] is in receipt of a referral for this patient by Hospitalist Albina Billet, MD, during recent admission. This patient is agreeable to palliative care services and an initial in-home visit appointment with Melanie Crazier, NP has been scheduled for 02/06/2021 at 9:30 am.  Admitted to [facility] with hypoxia, diarrhea, found to have MRSA pneumonia/aspiration pneumonia, severe protein-calorie  malnutrition  TSH abnormal, discharge summary recommended repeat TSH  T3/T4 in 4-6 weeks (7/18 to 8/1)  Recurrent vs persistent RLL pneumonia - unable to get CT while in hospital - possible outpt CT Pt needs palliative care possibly CCM SW referral to help with goals of care Has G-tube previously tolerating feeds but continued weight loss and protein calorie malnutrition   - following with [facility]  D/C on augmentin BID, levothyroxine 50 mcg daily, nebs  Other meds at dischargeL: START taking these medications  amoxicillin-clavulanate 875-125 mg per tablet Commonly known as: AUGMENTIN Take 1 tablet by mouth Two (2) times a day for 2 days.  levothyroxine 50 MCG tablet Commonly known as: SYNTHROID Take 1 tablet (50 mcg total) by mouth in the morning. Give synthroid 1 hour after feeds end in the late morning. Wait 1 hour for food intake after taking the medication.. Start taking on: January 27, 2021  sodium chloride 3 % nebulizer solution Inhale 4 mL by nebulization every eight (8) hours.   CHANGE how you take these medications  methylphenidate HCl 5 MG tablet Commonly known as: RITALIN Take 1 tablet (5 mg total) by mouth Two (2) times a day for 5 days. What changed: additional instructions   CONTINUE taking these medications  apixaban 5 mg Tab Commonly known as: ELIQUIS Take 1 tablet (5 mg total) by mouth Two (2) times a day.  folic acid 1 MG tablet Commonly known as: FOLVITE Give 1 tablet (1 mg total) by G-tube route in the morning.  gabapentin 100 MG capsule Commonly known as: NEURONTIN Take 1 capsule (100  mg total) by mouth Three (3) times a day.  magnesium oxide 400 mg (241.3 mg elemental) tablet Commonly known as: MAGOX Take 1 tablet (400 mg total) by mouth Two (2) times a day.  metFORMIN 1000 MG tablet Commonly known as: GLUCOPHAGE Take 1 tablet (1,000 mg total) by mouth in the morning and 1 tablet (1,000 mg total) in the evening. Take with meals.  polyethylene  glycol 17 gram packet Commonly known as: MIRALAX Take 17 g by mouth as needed in the morning.  rosuvastatin 5 MG tablet Commonly known as: CRESTOR Take 5 mg by mouth nightly.  senna 8.6 mg tablet Commonly known as: SENOKOT Take 2 tablets by mouth nightly.  sertraline 25 MG tablet Commonly known as: ZOLOFT Take 1 tablet (25 mg total) by mouth daily.   Patient Active Problem List   Diagnosis Date Noted   Diarrhea 12/22/2020   G tube feedings ([place]) 12/22/2020   Generalized weakness 12/22/2020   Hyponatremia 12/22/2020   S/P laryngectomy 12/22/2020   Moderate protein-calorie malnutrition ([place]) 11/26/2020   Hypokalemia 10/09/2020   Metastatic squamous cell carcinoma to head and neck ([place]) 08/22/2020   Goals of care, counseling/discussion 08/19/2020   Laryngeal cancer ([place]) 08/19/2020   Chronic arthropathy 10/25/2019   Onychomycosis of multiple toenails with type 2 diabetes mellitus ([place]) 05/16/2018   Erectile dysfunction 08/19/2015   Type 2 diabetes mellitus without complication ([place]) 79/39/0300   Tobacco abuse 04/09/2015   Hyperlipidemia 04/02/2015   Hypertension 04/02/2015   Panlobular emphysema ([place]) 04/02/2015    Social History   Tobacco Use   Smoking status: Every Day    Packs/day: 1.50    Years: 64.00    Pack years: 96.00    Types: Cigarettes   Smokeless tobacco: Never  Substance Use Topics   Alcohol use: No    Alcohol/week: 0.0 standard drinks     Current Outpatient Medications:    acetaminophen (TYLENOL) 325 MG tablet, Take 2 tablets (650 mg total) by mouth every 6 (six) hours as needed for mild pain (or Fever >/= 101)., Disp: , Rfl:    apixaban (ELIQUIS) 5 MG TABS tablet, Take by mouth., Disp: , Rfl:    aspirin 81 MG chewable tablet, Chew by mouth., Disp: , Rfl:    folic acid (FOLVITE) 1 MG tablet, Give 1 tablet (1 mg total) by G-tube route in the morning., Disp: , Rfl:    gabapentin (NEURONTIN) 300 MG capsule, Take by mouth., Disp: , Rfl:     levothyroxine (SYNTHROID) 50 MCG tablet, Take by mouth., Disp: , Rfl:    magnesium oxide (MAG-OX) 400 MG tablet, Take by mouth., Disp: , Rfl:    metFORMIN (GLUCOPHAGE) 1000 MG tablet, Take 1 tablet (1,000 mg total) by mouth daily with breakfast. (Patient taking differently: Take 1,000 mg by mouth daily.), Disp: 180 tablet, Rfl: 3   pantoprazole (PROTONIX) 40 MG tablet, Take 1 tablet (40 mg total) by mouth daily., Disp: 30 tablet, Rfl: 3   potassium chloride SA (KLOR-CON) 20 MEQ tablet, , Disp: , Rfl:    rosuvastatin (CRESTOR) 5 MG tablet, TAKE ONE TABLET EVERY DAY AT BEDTIME, Disp: 90 tablet, Rfl: 3   amLODipine (NORVASC) 5 MG tablet, TAKE (1) TABLET BY MOUTH EVERY DAY (Patient not taking: No sig reported), Disp: 90 tablet, Rfl: 3   amoxicillin-clavulanate (AUGMENTIN) 875-125 MG tablet, Take by mouth., Disp: , Rfl:    blood glucose meter kit and supplies, Accuchek or whatever is covered by insurance; check sugars once a day only  if desired; Dx E11.65, LON 99 months (Patient not taking: Reported on 01/28/2021), Disp: 1 each, Rfl: 0   glucose blood test strip, Use as instructed to check sugars once a day if desired; LON 99 months, Dx E11.65 (Patient not taking: Reported on 01/28/2021), Disp: 100 each, Rfl: 3   mirtazapine (REMERON) 15 MG tablet, Take by mouth., Disp: , Rfl:    Morphine Sulfate (MORPHINE CONCENTRATE) 10 mg / 0.5 ml concentrated solution, Take 0.25 mLs (5 mg total) by mouth every 4 (four) hours as needed for severe pain. (Patient not taking: Reported on 01/28/2021), Disp: 30 mL, Rfl: 0   oxyCODONE (ROXICODONE) 5 MG/5ML solution, , Disp: , Rfl:    Oxycodone HCl 10 MG TABS, Take 1 tablet by mouth every 3-4 hours as needed for pain (Patient not taking: Reported on 01/28/2021), Disp: , Rfl:    prochlorperazine (COMPAZINE) 10 MG tablet, , Disp: , Rfl:    sulfamethoxazole-trimethoprim (BACTRIM DS) 800-160 MG tablet, , Disp: , Rfl:   No Known Allergies  Chart Review:   Review of  Systems   Objective:    Virtual encounter, vitals limited, only able to obtain the following There were no vitals filed for this visit. There is no height or weight on file to calculate BMI. Nursing Note and Vital Signs reviewed.  Physical Exam  PE limited by telephone encounter  No results found for this or any previous visit (from the past 72 hour(s)).  Assessment and Plan:   No diagnosis found.  -Red flags and when to present for emergency care or RTC including but not limited to new/worsening/un-resolving symptoms,  reviewed with patient at time of visit. Follow up and care instructions discussed and provided in AVS. - I discussed the assessment and treatment plan with the patient. The patient was provided an opportunity to ask questions and all were answered. The patient agreed with the plan and demonstrated an understanding of the instructions.  - The patient was advised to call back or seek an in-person evaluation if the symptoms worsen or if the condition fails to improve as anticipated.  I provided  minutes of non-face-to-face time during this encounter.  Delsa Grana, PA-C 01/28/21 10:43 AM

## 2021-01-28 NOTE — Telephone Encounter (Signed)
Patient called to inform doctor that he was confused about his appt. Today and missed the call.  Please call back to reschedule.

## 2021-01-29 DIAGNOSIS — C329 Malignant neoplasm of larynx, unspecified: Secondary | ICD-10-CM | POA: Diagnosis not present

## 2021-01-29 DIAGNOSIS — C7989 Secondary malignant neoplasm of other specified sites: Secondary | ICD-10-CM | POA: Diagnosis not present

## 2021-01-29 DIAGNOSIS — E890 Postprocedural hypothyroidism: Secondary | ICD-10-CM | POA: Diagnosis not present

## 2021-01-29 DIAGNOSIS — R531 Weakness: Secondary | ICD-10-CM | POA: Diagnosis not present

## 2021-01-29 DIAGNOSIS — J431 Panlobular emphysema: Secondary | ICD-10-CM | POA: Diagnosis not present

## 2021-01-29 DIAGNOSIS — E43 Unspecified severe protein-calorie malnutrition: Secondary | ICD-10-CM | POA: Diagnosis not present

## 2021-01-29 DIAGNOSIS — J189 Pneumonia, unspecified organism: Secondary | ICD-10-CM | POA: Diagnosis not present

## 2021-01-29 DIAGNOSIS — R1310 Dysphagia, unspecified: Secondary | ICD-10-CM | POA: Diagnosis not present

## 2021-01-29 DIAGNOSIS — E871 Hypo-osmolality and hyponatremia: Secondary | ICD-10-CM | POA: Diagnosis not present

## 2021-01-29 NOTE — Telephone Encounter (Signed)
Tried to call pt to r/s this appt. VM is not setup

## 2021-01-29 NOTE — Telephone Encounter (Signed)
Please call to reschedule.

## 2021-02-02 ENCOUNTER — Inpatient Hospital Stay: Payer: Medicare HMO | Admitting: Family Medicine

## 2021-02-03 DIAGNOSIS — E43 Unspecified severe protein-calorie malnutrition: Secondary | ICD-10-CM | POA: Diagnosis not present

## 2021-02-03 DIAGNOSIS — R531 Weakness: Secondary | ICD-10-CM | POA: Diagnosis not present

## 2021-02-03 DIAGNOSIS — Z430 Encounter for attention to tracheostomy: Secondary | ICD-10-CM | POA: Diagnosis not present

## 2021-02-03 DIAGNOSIS — R1310 Dysphagia, unspecified: Secondary | ICD-10-CM | POA: Diagnosis not present

## 2021-02-03 DIAGNOSIS — C329 Malignant neoplasm of larynx, unspecified: Secondary | ICD-10-CM | POA: Diagnosis not present

## 2021-02-03 DIAGNOSIS — J189 Pneumonia, unspecified organism: Secondary | ICD-10-CM | POA: Diagnosis not present

## 2021-02-03 DIAGNOSIS — J431 Panlobular emphysema: Secondary | ICD-10-CM | POA: Diagnosis not present

## 2021-02-03 DIAGNOSIS — E890 Postprocedural hypothyroidism: Secondary | ICD-10-CM | POA: Diagnosis not present

## 2021-02-03 DIAGNOSIS — E871 Hypo-osmolality and hyponatremia: Secondary | ICD-10-CM | POA: Diagnosis not present

## 2021-02-03 DIAGNOSIS — C7989 Secondary malignant neoplasm of other specified sites: Secondary | ICD-10-CM | POA: Diagnosis not present

## 2021-02-04 DIAGNOSIS — J431 Panlobular emphysema: Secondary | ICD-10-CM | POA: Diagnosis not present

## 2021-02-04 DIAGNOSIS — R1310 Dysphagia, unspecified: Secondary | ICD-10-CM | POA: Diagnosis not present

## 2021-02-04 DIAGNOSIS — C329 Malignant neoplasm of larynx, unspecified: Secondary | ICD-10-CM | POA: Diagnosis not present

## 2021-02-04 DIAGNOSIS — E43 Unspecified severe protein-calorie malnutrition: Secondary | ICD-10-CM | POA: Diagnosis not present

## 2021-02-04 DIAGNOSIS — E890 Postprocedural hypothyroidism: Secondary | ICD-10-CM | POA: Diagnosis not present

## 2021-02-04 DIAGNOSIS — E871 Hypo-osmolality and hyponatremia: Secondary | ICD-10-CM | POA: Diagnosis not present

## 2021-02-04 DIAGNOSIS — J189 Pneumonia, unspecified organism: Secondary | ICD-10-CM | POA: Diagnosis not present

## 2021-02-04 DIAGNOSIS — C7989 Secondary malignant neoplasm of other specified sites: Secondary | ICD-10-CM | POA: Diagnosis not present

## 2021-02-04 DIAGNOSIS — R531 Weakness: Secondary | ICD-10-CM | POA: Diagnosis not present

## 2021-02-05 ENCOUNTER — Other Ambulatory Visit: Payer: Self-pay

## 2021-02-05 ENCOUNTER — Encounter: Payer: Self-pay | Admitting: Unknown Physician Specialty

## 2021-02-05 ENCOUNTER — Telehealth (INDEPENDENT_AMBULATORY_CARE_PROVIDER_SITE_OTHER): Payer: Medicare HMO | Admitting: Unknown Physician Specialty

## 2021-02-05 DIAGNOSIS — E890 Postprocedural hypothyroidism: Secondary | ICD-10-CM | POA: Diagnosis not present

## 2021-02-05 DIAGNOSIS — Z7189 Other specified counseling: Secondary | ICD-10-CM

## 2021-02-05 DIAGNOSIS — E039 Hypothyroidism, unspecified: Secondary | ICD-10-CM

## 2021-02-05 DIAGNOSIS — J189 Pneumonia, unspecified organism: Secondary | ICD-10-CM | POA: Diagnosis not present

## 2021-02-05 DIAGNOSIS — R531 Weakness: Secondary | ICD-10-CM | POA: Diagnosis not present

## 2021-02-05 DIAGNOSIS — E871 Hypo-osmolality and hyponatremia: Secondary | ICD-10-CM | POA: Diagnosis not present

## 2021-02-05 DIAGNOSIS — E43 Unspecified severe protein-calorie malnutrition: Secondary | ICD-10-CM | POA: Diagnosis not present

## 2021-02-05 DIAGNOSIS — J431 Panlobular emphysema: Secondary | ICD-10-CM | POA: Diagnosis not present

## 2021-02-05 DIAGNOSIS — C329 Malignant neoplasm of larynx, unspecified: Secondary | ICD-10-CM | POA: Diagnosis not present

## 2021-02-05 DIAGNOSIS — R1310 Dysphagia, unspecified: Secondary | ICD-10-CM | POA: Diagnosis not present

## 2021-02-05 DIAGNOSIS — Z430 Encounter for attention to tracheostomy: Secondary | ICD-10-CM | POA: Diagnosis not present

## 2021-02-05 DIAGNOSIS — C7989 Secondary malignant neoplasm of other specified sites: Secondary | ICD-10-CM | POA: Diagnosis not present

## 2021-02-05 MED ORDER — LEVOTHYROXINE SODIUM 75 MCG PO TABS: 75.0000 ug | ORAL_TABLET | Freq: Every day | ORAL | 0 refills | Status: DC

## 2021-02-05 NOTE — Assessment & Plan Note (Signed)
Palliative care appt is soon.  Encouraged goals of care discussions.

## 2021-02-05 NOTE — Assessment & Plan Note (Signed)
Will increase Synthroid 75 mcgs.  Recheck TSH in 6 weeks.  Tube feedings likely effect absorption.

## 2021-02-05 NOTE — Progress Notes (Signed)
There were no vitals taken for this visit.   Subjective:    Patient ID: Connor Aguilar, male    DOB: 08-May-1941, 80 y.o.   MRN: [id number]  HPI: Connor Aguilar is a 80 y.o. male  Chief Complaint  Patient presents with   Hospitalization Follow-up   Follow-up    Thyroid labs and medication    This visit was completed via telephone due to the restrictions of the COVID-19 pandemic. All issues as above were discussed and addressed but no physical exam was performed. If it was felt that the patient should be evaluated in the office, they were directed there. The patient verbally consented to this visit. Patient was unable to complete an audio/visual visit due to Technical difficulties. Location of the patient: home Location of the provider: work Those involved with this call:  Provider: Kathrine Haddock FNP Time spent on call: 10 ninute call with 10 minute chart review I verified patient identity using two factors (patient name and date of birth). Patient consents verbally to being seen via telemedicine visit today.     Pt's caregiver April is calling about his elevated TSH of 54 while hospitalized.  Taking Synthroid 50 mcgs.  Caregiver reports Synthroid given daily.  Taken an hour before breakfast but is receiving tube feedings with a G tube.  April gives all the history   Relevant past medical, surgical, family and social history reviewed and updated as indicated. Interim medical history since our last visit reviewed. Allergies and medications reviewed and updated.  Per HPI unless specifically indicated above     Objective:    There were no vitals taken for this visit.  Wt Readings from Last 3 Encounters:  12/18/20 110 lb (49.9 kg)  11/26/20 117 lb 9.6 oz (53.3 kg)  08/19/20 123 lb 1.6 oz (55.8 kg)    Physical Exam  Talked to April, the caregiver  Results for orders placed or performed in visit on 12/18/20  Urine Culture   Specimen: Urine  Result Value Ref Range   MICRO  NUMBER: [id number]    SPECIMEN QUALITY: Adequate    Sample Source URINE    STATUS: FINAL    Result:      Mixed genital flora isolated. These superficial bacteria are not indicative of a urinary tract infection. No further organism identification is warranted on this specimen. If clinically indicated, recollect clean-catch, mid-stream urine and transfer  immediately to Urine Culture Transport Tube.   Urinalysis, Routine w reflex microscopic  Result Value Ref Range   Color, Urine YELLOW YELLOW   APPearance CLEAR CLEAR   Specific Gravity, Urine 1.014 1.001 - 1.035   pH 8.5 (H) 5.0 - 8.0   Glucose, UA NEGATIVE NEGATIVE   Bilirubin Urine NEGATIVE NEGATIVE   Ketones, ur NEGATIVE NEGATIVE   Hgb urine dipstick NEGATIVE NEGATIVE   Protein, ur NEGATIVE NEGATIVE   Nitrite NEGATIVE NEGATIVE   Leukocytes,Ua NEGATIVE NEGATIVE      Assessment & Plan:   Problem List Items Addressed This Visit       Unprioritized   Goals of care, counseling/discussion    Palliative care appt is soon.  Encouraged goals of care discussions.         Hypothyroidism - Primary    Will increase Synthroid 75 mcgs.  Recheck TSH in 6 weeks.  Tube feedings likely effect absorption.         Relevant Medications   levothyroxine (SYNTHROID) 75 MCG tablet   Other Relevant Orders   TSH  Follow up plan: Return in about 6 weeks (around 03/19/2021).

## 2021-02-06 DIAGNOSIS — E43 Unspecified severe protein-calorie malnutrition: Secondary | ICD-10-CM | POA: Diagnosis not present

## 2021-02-06 DIAGNOSIS — R1310 Dysphagia, unspecified: Secondary | ICD-10-CM | POA: Diagnosis not present

## 2021-02-06 DIAGNOSIS — E871 Hypo-osmolality and hyponatremia: Secondary | ICD-10-CM | POA: Diagnosis not present

## 2021-02-06 DIAGNOSIS — E890 Postprocedural hypothyroidism: Secondary | ICD-10-CM | POA: Diagnosis not present

## 2021-02-06 DIAGNOSIS — Z931 Gastrostomy status: Secondary | ICD-10-CM | POA: Diagnosis not present

## 2021-02-06 DIAGNOSIS — R5383 Other fatigue: Secondary | ICD-10-CM | POA: Diagnosis not present

## 2021-02-06 DIAGNOSIS — F39 Unspecified mood [affective] disorder: Secondary | ICD-10-CM | POA: Diagnosis not present

## 2021-02-06 DIAGNOSIS — J431 Panlobular emphysema: Secondary | ICD-10-CM | POA: Diagnosis not present

## 2021-02-06 DIAGNOSIS — R531 Weakness: Secondary | ICD-10-CM | POA: Diagnosis not present

## 2021-02-06 DIAGNOSIS — J189 Pneumonia, unspecified organism: Secondary | ICD-10-CM | POA: Diagnosis not present

## 2021-02-06 DIAGNOSIS — C7989 Secondary malignant neoplasm of other specified sites: Secondary | ICD-10-CM | POA: Diagnosis not present

## 2021-02-06 DIAGNOSIS — Z9002 Acquired absence of larynx: Secondary | ICD-10-CM | POA: Diagnosis not present

## 2021-02-06 DIAGNOSIS — C329 Malignant neoplasm of larynx, unspecified: Secondary | ICD-10-CM | POA: Diagnosis not present

## 2021-02-06 DIAGNOSIS — Z515 Encounter for palliative care: Secondary | ICD-10-CM | POA: Diagnosis not present

## 2021-02-10 DIAGNOSIS — J431 Panlobular emphysema: Secondary | ICD-10-CM | POA: Diagnosis not present

## 2021-02-10 DIAGNOSIS — E890 Postprocedural hypothyroidism: Secondary | ICD-10-CM | POA: Diagnosis not present

## 2021-02-10 DIAGNOSIS — E43 Unspecified severe protein-calorie malnutrition: Secondary | ICD-10-CM | POA: Diagnosis not present

## 2021-02-10 DIAGNOSIS — C329 Malignant neoplasm of larynx, unspecified: Secondary | ICD-10-CM | POA: Diagnosis not present

## 2021-02-10 DIAGNOSIS — R531 Weakness: Secondary | ICD-10-CM | POA: Diagnosis not present

## 2021-02-10 DIAGNOSIS — J189 Pneumonia, unspecified organism: Secondary | ICD-10-CM | POA: Diagnosis not present

## 2021-02-10 DIAGNOSIS — C7989 Secondary malignant neoplasm of other specified sites: Secondary | ICD-10-CM | POA: Diagnosis not present

## 2021-02-10 DIAGNOSIS — R1310 Dysphagia, unspecified: Secondary | ICD-10-CM | POA: Diagnosis not present

## 2021-02-10 DIAGNOSIS — E871 Hypo-osmolality and hyponatremia: Secondary | ICD-10-CM | POA: Diagnosis not present

## 2021-02-11 DIAGNOSIS — J690 Pneumonitis due to inhalation of food and vomit: Secondary | ICD-10-CM | POA: Diagnosis not present

## 2021-02-11 DIAGNOSIS — Z430 Encounter for attention to tracheostomy: Secondary | ICD-10-CM | POA: Diagnosis not present

## 2021-02-11 DIAGNOSIS — R0902 Hypoxemia: Secondary | ICD-10-CM | POA: Diagnosis not present

## 2021-02-11 DIAGNOSIS — C7989 Secondary malignant neoplasm of other specified sites: Secondary | ICD-10-CM | POA: Diagnosis not present

## 2021-02-12 DIAGNOSIS — R1310 Dysphagia, unspecified: Secondary | ICD-10-CM | POA: Diagnosis not present

## 2021-02-12 DIAGNOSIS — C7989 Secondary malignant neoplasm of other specified sites: Secondary | ICD-10-CM | POA: Diagnosis not present

## 2021-02-12 DIAGNOSIS — R531 Weakness: Secondary | ICD-10-CM | POA: Diagnosis not present

## 2021-02-12 DIAGNOSIS — J431 Panlobular emphysema: Secondary | ICD-10-CM | POA: Diagnosis not present

## 2021-02-12 DIAGNOSIS — E871 Hypo-osmolality and hyponatremia: Secondary | ICD-10-CM | POA: Diagnosis not present

## 2021-02-12 DIAGNOSIS — E890 Postprocedural hypothyroidism: Secondary | ICD-10-CM | POA: Diagnosis not present

## 2021-02-12 DIAGNOSIS — E43 Unspecified severe protein-calorie malnutrition: Secondary | ICD-10-CM | POA: Diagnosis not present

## 2021-02-12 DIAGNOSIS — J189 Pneumonia, unspecified organism: Secondary | ICD-10-CM | POA: Diagnosis not present

## 2021-02-12 DIAGNOSIS — C329 Malignant neoplasm of larynx, unspecified: Secondary | ICD-10-CM | POA: Diagnosis not present

## 2021-02-17 DIAGNOSIS — C7989 Secondary malignant neoplasm of other specified sites: Secondary | ICD-10-CM | POA: Diagnosis not present

## 2021-02-17 DIAGNOSIS — C329 Malignant neoplasm of larynx, unspecified: Secondary | ICD-10-CM | POA: Diagnosis not present

## 2021-02-17 DIAGNOSIS — R1310 Dysphagia, unspecified: Secondary | ICD-10-CM | POA: Diagnosis not present

## 2021-02-17 DIAGNOSIS — J431 Panlobular emphysema: Secondary | ICD-10-CM | POA: Diagnosis not present

## 2021-02-17 DIAGNOSIS — E43 Unspecified severe protein-calorie malnutrition: Secondary | ICD-10-CM | POA: Diagnosis not present

## 2021-02-17 DIAGNOSIS — E871 Hypo-osmolality and hyponatremia: Secondary | ICD-10-CM | POA: Diagnosis not present

## 2021-02-17 DIAGNOSIS — J189 Pneumonia, unspecified organism: Secondary | ICD-10-CM | POA: Diagnosis not present

## 2021-02-17 DIAGNOSIS — R531 Weakness: Secondary | ICD-10-CM | POA: Diagnosis not present

## 2021-02-17 DIAGNOSIS — E890 Postprocedural hypothyroidism: Secondary | ICD-10-CM | POA: Diagnosis not present

## 2021-02-18 DIAGNOSIS — J431 Panlobular emphysema: Secondary | ICD-10-CM | POA: Diagnosis not present

## 2021-02-18 DIAGNOSIS — C7989 Secondary malignant neoplasm of other specified sites: Secondary | ICD-10-CM | POA: Diagnosis not present

## 2021-02-18 DIAGNOSIS — E43 Unspecified severe protein-calorie malnutrition: Secondary | ICD-10-CM | POA: Diagnosis not present

## 2021-02-18 DIAGNOSIS — C329 Malignant neoplasm of larynx, unspecified: Secondary | ICD-10-CM | POA: Diagnosis not present

## 2021-02-18 DIAGNOSIS — E871 Hypo-osmolality and hyponatremia: Secondary | ICD-10-CM | POA: Diagnosis not present

## 2021-02-18 DIAGNOSIS — E890 Postprocedural hypothyroidism: Secondary | ICD-10-CM | POA: Diagnosis not present

## 2021-02-18 DIAGNOSIS — R1310 Dysphagia, unspecified: Secondary | ICD-10-CM | POA: Diagnosis not present

## 2021-02-18 DIAGNOSIS — R531 Weakness: Secondary | ICD-10-CM | POA: Diagnosis not present

## 2021-02-18 DIAGNOSIS — J189 Pneumonia, unspecified organism: Secondary | ICD-10-CM | POA: Diagnosis not present

## 2021-02-20 DIAGNOSIS — R1310 Dysphagia, unspecified: Secondary | ICD-10-CM | POA: Diagnosis not present

## 2021-02-20 DIAGNOSIS — E43 Unspecified severe protein-calorie malnutrition: Secondary | ICD-10-CM | POA: Diagnosis not present

## 2021-02-20 DIAGNOSIS — J189 Pneumonia, unspecified organism: Secondary | ICD-10-CM | POA: Diagnosis not present

## 2021-02-20 DIAGNOSIS — C7989 Secondary malignant neoplasm of other specified sites: Secondary | ICD-10-CM | POA: Diagnosis not present

## 2021-02-20 DIAGNOSIS — E890 Postprocedural hypothyroidism: Secondary | ICD-10-CM | POA: Diagnosis not present

## 2021-02-20 DIAGNOSIS — E871 Hypo-osmolality and hyponatremia: Secondary | ICD-10-CM | POA: Diagnosis not present

## 2021-02-20 DIAGNOSIS — J431 Panlobular emphysema: Secondary | ICD-10-CM | POA: Diagnosis not present

## 2021-02-20 DIAGNOSIS — R531 Weakness: Secondary | ICD-10-CM | POA: Diagnosis not present

## 2021-02-20 DIAGNOSIS — C329 Malignant neoplasm of larynx, unspecified: Secondary | ICD-10-CM | POA: Diagnosis not present

## 2021-02-24 DIAGNOSIS — J431 Panlobular emphysema: Secondary | ICD-10-CM | POA: Diagnosis not present

## 2021-02-24 DIAGNOSIS — R531 Weakness: Secondary | ICD-10-CM | POA: Diagnosis not present

## 2021-02-24 DIAGNOSIS — E890 Postprocedural hypothyroidism: Secondary | ICD-10-CM | POA: Diagnosis not present

## 2021-02-24 DIAGNOSIS — C7989 Secondary malignant neoplasm of other specified sites: Secondary | ICD-10-CM | POA: Diagnosis not present

## 2021-02-24 DIAGNOSIS — R1310 Dysphagia, unspecified: Secondary | ICD-10-CM | POA: Diagnosis not present

## 2021-02-24 DIAGNOSIS — J189 Pneumonia, unspecified organism: Secondary | ICD-10-CM | POA: Diagnosis not present

## 2021-02-24 DIAGNOSIS — E871 Hypo-osmolality and hyponatremia: Secondary | ICD-10-CM | POA: Diagnosis not present

## 2021-02-24 DIAGNOSIS — C329 Malignant neoplasm of larynx, unspecified: Secondary | ICD-10-CM | POA: Diagnosis not present

## 2021-02-24 DIAGNOSIS — E43 Unspecified severe protein-calorie malnutrition: Secondary | ICD-10-CM | POA: Diagnosis not present

## 2021-02-25 DIAGNOSIS — C7989 Secondary malignant neoplasm of other specified sites: Secondary | ICD-10-CM | POA: Diagnosis not present

## 2021-02-25 DIAGNOSIS — R933 Abnormal findings on diagnostic imaging of other parts of digestive tract: Secondary | ICD-10-CM | POA: Diagnosis not present

## 2021-02-25 DIAGNOSIS — R0902 Hypoxemia: Secondary | ICD-10-CM | POA: Diagnosis not present

## 2021-02-25 DIAGNOSIS — Z87891 Personal history of nicotine dependence: Secondary | ICD-10-CM | POA: Diagnosis not present

## 2021-02-25 DIAGNOSIS — R9389 Abnormal findings on diagnostic imaging of other specified body structures: Secondary | ICD-10-CM | POA: Diagnosis not present

## 2021-02-25 DIAGNOSIS — Z430 Encounter for attention to tracheostomy: Secondary | ICD-10-CM | POA: Diagnosis not present

## 2021-02-25 DIAGNOSIS — J690 Pneumonitis due to inhalation of food and vomit: Secondary | ICD-10-CM | POA: Diagnosis not present

## 2021-02-25 DIAGNOSIS — Z9002 Acquired absence of larynx: Secondary | ICD-10-CM | POA: Diagnosis not present

## 2021-02-25 DIAGNOSIS — Z510 Encounter for antineoplastic radiation therapy: Secondary | ICD-10-CM | POA: Diagnosis not present

## 2021-02-26 DIAGNOSIS — E43 Unspecified severe protein-calorie malnutrition: Secondary | ICD-10-CM | POA: Diagnosis not present

## 2021-02-26 DIAGNOSIS — R531 Weakness: Secondary | ICD-10-CM | POA: Diagnosis not present

## 2021-02-26 DIAGNOSIS — Z430 Encounter for attention to tracheostomy: Secondary | ICD-10-CM | POA: Diagnosis not present

## 2021-02-26 DIAGNOSIS — E871 Hypo-osmolality and hyponatremia: Secondary | ICD-10-CM | POA: Diagnosis not present

## 2021-02-26 DIAGNOSIS — J431 Panlobular emphysema: Secondary | ICD-10-CM | POA: Diagnosis not present

## 2021-02-26 DIAGNOSIS — R1310 Dysphagia, unspecified: Secondary | ICD-10-CM | POA: Diagnosis not present

## 2021-02-26 DIAGNOSIS — C7989 Secondary malignant neoplasm of other specified sites: Secondary | ICD-10-CM | POA: Diagnosis not present

## 2021-02-26 DIAGNOSIS — J690 Pneumonitis due to inhalation of food and vomit: Secondary | ICD-10-CM | POA: Diagnosis not present

## 2021-02-26 DIAGNOSIS — C329 Malignant neoplasm of larynx, unspecified: Secondary | ICD-10-CM | POA: Diagnosis not present

## 2021-02-26 DIAGNOSIS — E890 Postprocedural hypothyroidism: Secondary | ICD-10-CM | POA: Diagnosis not present

## 2021-02-26 DIAGNOSIS — R0902 Hypoxemia: Secondary | ICD-10-CM | POA: Diagnosis not present

## 2021-02-26 DIAGNOSIS — J189 Pneumonia, unspecified organism: Secondary | ICD-10-CM | POA: Diagnosis not present

## 2021-02-28 DIAGNOSIS — C7989 Secondary malignant neoplasm of other specified sites: Secondary | ICD-10-CM | POA: Diagnosis not present

## 2021-02-28 DIAGNOSIS — Z430 Encounter for attention to tracheostomy: Secondary | ICD-10-CM | POA: Diagnosis not present

## 2021-03-03 DIAGNOSIS — E890 Postprocedural hypothyroidism: Secondary | ICD-10-CM | POA: Diagnosis not present

## 2021-03-03 DIAGNOSIS — E871 Hypo-osmolality and hyponatremia: Secondary | ICD-10-CM | POA: Diagnosis not present

## 2021-03-03 DIAGNOSIS — J189 Pneumonia, unspecified organism: Secondary | ICD-10-CM | POA: Diagnosis not present

## 2021-03-03 DIAGNOSIS — R531 Weakness: Secondary | ICD-10-CM | POA: Diagnosis not present

## 2021-03-03 DIAGNOSIS — C329 Malignant neoplasm of larynx, unspecified: Secondary | ICD-10-CM | POA: Diagnosis not present

## 2021-03-03 DIAGNOSIS — E43 Unspecified severe protein-calorie malnutrition: Secondary | ICD-10-CM | POA: Diagnosis not present

## 2021-03-03 DIAGNOSIS — R1310 Dysphagia, unspecified: Secondary | ICD-10-CM | POA: Diagnosis not present

## 2021-03-03 DIAGNOSIS — J431 Panlobular emphysema: Secondary | ICD-10-CM | POA: Diagnosis not present

## 2021-03-03 DIAGNOSIS — C7989 Secondary malignant neoplasm of other specified sites: Secondary | ICD-10-CM | POA: Diagnosis not present

## 2021-03-04 ENCOUNTER — Ambulatory Visit: Payer: Self-pay | Admitting: *Deleted

## 2021-03-04 ENCOUNTER — Telehealth: Payer: Self-pay | Admitting: Family Medicine

## 2021-03-04 ENCOUNTER — Other Ambulatory Visit: Payer: Self-pay | Admitting: Family Medicine

## 2021-03-04 ENCOUNTER — Telehealth: Payer: Self-pay

## 2021-03-04 DIAGNOSIS — Z5181 Encounter for therapeutic drug level monitoring: Secondary | ICD-10-CM

## 2021-03-04 DIAGNOSIS — I10 Essential (primary) hypertension: Secondary | ICD-10-CM

## 2021-03-04 NOTE — Telephone Encounter (Signed)
error 

## 2021-03-04 NOTE — Progress Notes (Signed)
Chronic Care Management Pharmacy Assistant   Name: Janet Decesare  MRN: [id number] DOB: June 17, 1941  Reason for Encounter: Hypertension/Diabetes Disease State Call with general questions included   Recent office visits:  02/05/2021 Kathrine Haddock, NP (PCP Video Visit)-  For Hospitalization Follow-up- Synthroid increased to 75 mcg  01/28/2021 Delsa Grana, PA-C (PCP Video Visit) for Hospitalization follow-up- No medication changes indicated  12/18/2020 Delsa Grana, PA-C (PCP Video Visit) for Urinary frequency- No Medication changes indicated; labs ordered  11/26/2020 Delsa Grana, PA-C (PCP Office Visit) for Follow-up- No medication changes indicated, patient instructed to follow-up in 6 weeks  Recent consult visits:  11/19/2020 Siddharth Hermant Sheth, DO (Oncology) for Localized Swelling of right upper extremity- No medication changes indicated  11/18/2020 Siddharth Hermant Sheth, DO (Oncology) for follow-up- No medication changes noted; patient instructed to follow-up in 2 weeks  11/11/2020 [place], DO (Oncology) No medication changes noted  11/11/2020 Jillene Bucks, ANP (Hematology)- I don't have the appropriate security to download document to see note  11/04/2020 Siddharth Hermant Sheth (Oncology) I don't have the appropriate security to download document to see note  11/04/2020 Jillene Bucks, ANP (Hematology)- I don't have the appropriate security to download document to see note  Hospital visits:   Medication Reconciliation was completed by comparing discharge summary, patient's EMR and Pharmacy list, and upon discussion with patient.  Admitted to the hospital on 12/22/2020 due to Genarlized Weakness. Discharge date was 12/26/2020. Discharged from [facility] [place]?Medications Started at [facility] Discharge:?? -started Methyphenidate HCL 5 mg tablet, Miralax prn, Senna 8.6 mg 2 tablets nightly, Zoloft 25 mg,   due to Cancer Diagnosis  Medication Changes at Hospital Discharge: -Changed Gabapentin 100 mg  3 times daily  Medications Discontinued at Hospital Discharge: -Stopped None Id  Medications that remain the same after Hospital Discharge:??  -All other medications will remain the same.     Medication Reconciliation was completed by comparing discharge summary, patient's EMR and Pharmacy list, and upon discussion with patient.  Admitted to the hospital on 01/11/2021 due to Sepsis. Discharge date was 01/15/2021. Discharged from [place]?Medications Started at [facility] Discharge:?? -started Folic Acid 1 mg tablet, Linezolid 600 mg tablet, Metformin 1000 mg tablet,    Medication Changes at Hospital Discharge: -Changed None ID  Medications Discontinued at Hospital Discharge: -Stopped None ID  Medications that remain the same after Hospital Discharge:??  -All other medications will remain the same.    Medication Reconciliation was completed by comparing discharge summary, patient's EMR and Pharmacy list, and upon discussion with patient.  Admitted to the hospital on 01/21/2021 due to PNA of R lower lobe due to infectious organism. Discharge date was 01/26/2021. Discharged from [place]?Medications Started at [facility] Discharge:?? -started Amoxicillin-clavulanate 875-125 mg 1 tablet twice daily, Levothyroxine 50 mcg; Sodium Chloride 3% nebulizer solution Inhale 4 mL every 8 hours,   Medication Changes at Hospital Discharge: -Changed Methylphenidate HCl 5 mg 1 tablet twice daily X 5 days;   Medications Discontinued at Hospital Discharge: -Stopped None ID  Medications that remain the same after Hospital Discharge:??  -All other medications will remain the same.     Medications: Outpatient Encounter Medications as of 03/04/2021  Medication Sig   acetaminophen (TYLENOL) 325 MG tablet Take 2 tablets (650 mg total) by mouth every 6 (six)  hours as needed for mild pain (or Fever >/= 101).   amLODipine (NORVASC) 5  MG tablet TAKE (1) TABLET BY MOUTH EVERY DAY (Patient not taking: No sig reported)   apixaban (ELIQUIS) 5 MG TABS tablet Take by mouth.   aspirin 81 MG chewable tablet Chew by mouth.   blood glucose meter kit and supplies Accuchek or whatever is covered by insurance; check sugars once a day only if desired; Dx E11.65, LON 99 months (Patient not taking: Reported on 6/96/2952)   folic acid (FOLVITE) 1 MG tablet Give 1 tablet (1 mg total) by G-tube route in the morning.   gabapentin (NEURONTIN) 300 MG capsule Take by mouth.   glucose blood test strip Use as instructed to check sugars once a day if desired; LON 99 months, Dx E11.65 (Patient not taking: Reported on 01/28/2021)   levothyroxine (SYNTHROID) 75 MCG tablet Take 1 tablet (75 mcg total) by mouth daily.   magnesium oxide (MAG-OX) 400 MG tablet Take by mouth.   metFORMIN (GLUCOPHAGE) 1000 MG tablet Take 1 tablet (1,000 mg total) by mouth daily with breakfast. (Patient taking differently: Take 1,000 mg by mouth daily.)   mirtazapine (REMERON) 15 MG tablet Take by mouth.   Morphine Sulfate (MORPHINE CONCENTRATE) 10 mg / 0.5 ml concentrated solution Take 0.25 mLs (5 mg total) by mouth every 4 (four) hours as needed for severe pain. (Patient not taking: Reported on 01/28/2021)   oxyCODONE (ROXICODONE) 5 MG/5ML solution  (Patient not taking: Reported on 01/28/2021)   Oxycodone HCl 10 MG TABS Take 1 tablet by mouth every 3-4 hours as needed for pain (Patient not taking: Reported on 01/28/2021)   pantoprazole (PROTONIX) 40 MG tablet Take 1 tablet (40 mg total) by mouth daily.   potassium chloride SA (KLOR-CON) 20 MEQ tablet    prochlorperazine (COMPAZINE) 10 MG tablet  (Patient not taking: Reported on 01/28/2021)   rosuvastatin (CRESTOR) 5 MG tablet TAKE ONE TABLET EVERY DAY AT BEDTIME   sulfamethoxazole-trimethoprim (BACTRIM DS) 800-160 MG tablet  (Patient not taking: Reported on  01/28/2021)   No facility-administered encounter medications on file as of 03/04/2021.    Care Gaps: COVID-19 Vaccine OPHTHALMOLOGY EXAM PNA vac Low Risk Adult   Star Rating Drugs: Rosuvastatin 5 mg last filled on 12/01/2020 for a 90-Day supply with [facility] Metformin 1000 mg last filled on 11/28/2020 for 90-Day supply with [facility]  Spoke he's doing going   Reviewed chart prior to disease state call. Spoke with patient regarding BP  Recent Office Vitals: BP Readings from Last 3 Encounters:  12/18/20 108/81  11/26/20 118/62  08/19/20 127/79   Pulse Readings from Last 3 Encounters:  12/18/20 84  11/26/20 60  08/19/20 73    Wt Readings from Last 3 Encounters:  12/18/20 110 lb (49.9 kg)  11/26/20 117 lb 9.6 oz (53.3 kg)  08/19/20 123 lb 1.6 oz (55.8 kg)     Kidney Function Lab Results  Component Value Date/Time   CREATININE 0.99 08/19/2020 11:46 AM   CREATININE 0.90 07/29/2020 09:19 AM   CREATININE 0.90 04/21/2020 03:48 PM   CREATININE 1.00 02/25/2020 03:03 PM   GFRNONAA >60 08/19/2020 11:46 AM   GFRNONAA 80 04/21/2020 03:48 PM   GFRAA 93 04/21/2020 03:48 PM    BMP Latest Ref Rng & Units 08/19/2020 07/29/2020 04/21/2020  Glucose 70 - 99 mg/dL 114(H) - 94  BUN 8 - 23 mg/dL 12 - 12  Creatinine 0.61 - 1.24 mg/dL 0.99 0.90 0.90  BUN/Creat Ratio 6 - 22 (calc) - - NOT APPLICABLE  Sodium 841 - 145 mmol/L 136 -  136  Potassium 3.5 - 5.1 mmol/L 3.2(L) - 4.7  Chloride 98 - 111 mmol/L 97(L) - 94(L)  CO2 22 - 32 mmol/L 27 - 25  Calcium 8.9 - 10.3 mg/dL 9.1 - 10.0    Current antihypertensive regimen:  Amlodipine 5 mg   How often are you checking your Blood Pressure? daily  Current home BP readings: Wasn't able to get last reading at this time  What recent interventions/DTPs have been made by any provider to improve Blood Pressure control since last CPP Visit: None ID  Any recent hospitalizations or ED visits since last visit with CPP? Yes  What  diet changes have been made to improve Blood Pressure Control?  Patient was recently diagnosed with Cancer and has been placed on a feeding tube.   What exercise is being done to improve your Blood Pressure Control?  Patient doesn't really exercise since his new diagnosis patient has had sever symptoms and he does ambulate but does need assistance. Today when speaking with his caregiver April she stated that he does not appear to be himself and he isn't walking much today due to patient reports weakness.  Adherence Review:No Is the patient currently on ACE/ARB medication? No Does the patient have >5 day gap between last estimated fill dates? No   She reports that patients diabetes since his diagnosis doesn't appear to be much of an issue like it used to be especially since he has been placed on a feeding tube. This morning after a feeding his blood sugar number was 124. She stated that they are still giving him the Metformin but has an appointment with PCP to see if it can be discontinued due to the circumstances. Patient currently doesn't speak on the phone himself you will have to speak with his caregiver April or Son, and he is currently also apart of Palliative care. April did advise he does need a refill on his Levothyroxine 75 mcg. April also advised that patient has a bed sore x 3 months on the Right side of his butt cheek and she has been using Desitin and A&D Ointment on it but it's not healing and she reports that the is one starting to form on his tail bone. She denies any signs of infection, not hot to the touch but she states that she doesn't know what else to try to heal it as it forms a scab than the scab gets rubbed off and he starts bleeding. She stated that [facility] last week so no nurse is currently coming into the home. I contacted PCP's Office and spoke with Juliann Pulse and provided her with all the information. She is going to send message over to the nurse so they can take care  of the issue. A FYI was also sent to [name], CPP.  Telephone visit scheduled for 04/15/2021 @ 1000  81 minutes was spent with this chart as the patient has not been seen since January and he has had a lot of visits and hospital visits since this time. I was on the phone with his caregiver for a significant amount of time as well.  Lynann Bologna, CPA/CMA Clinical Pharmacist Assistant Phone: [phone]

## 2021-03-05 DIAGNOSIS — C7989 Secondary malignant neoplasm of other specified sites: Secondary | ICD-10-CM | POA: Diagnosis not present

## 2021-03-05 DIAGNOSIS — Z430 Encounter for attention to tracheostomy: Secondary | ICD-10-CM | POA: Diagnosis not present

## 2021-03-05 NOTE — Telephone Encounter (Signed)
We were unable to reach the caregiver who initially talked with Connor Aguilar from [place] office. Connor Aguilar passed along information regarding the patient has two bed sores and the caregiver was requesting what she should to to prevent further breakdown and what to apply to the sacral bed sores.  We were not able to contact the care giver at the number listed on the patient's chart.

## 2021-03-05 NOTE — Telephone Encounter (Signed)
Nothing attached? What is this regarding?

## 2021-03-05 NOTE — Telephone Encounter (Signed)
I believe they need recommendation on what to do for treatment of bed sore?please check previous note form CCM

## 2021-03-06 DIAGNOSIS — J431 Panlobular emphysema: Secondary | ICD-10-CM | POA: Diagnosis not present

## 2021-03-06 DIAGNOSIS — C329 Malignant neoplasm of larynx, unspecified: Secondary | ICD-10-CM | POA: Diagnosis not present

## 2021-03-06 DIAGNOSIS — E43 Unspecified severe protein-calorie malnutrition: Secondary | ICD-10-CM | POA: Diagnosis not present

## 2021-03-06 DIAGNOSIS — E890 Postprocedural hypothyroidism: Secondary | ICD-10-CM | POA: Diagnosis not present

## 2021-03-06 DIAGNOSIS — E871 Hypo-osmolality and hyponatremia: Secondary | ICD-10-CM | POA: Diagnosis not present

## 2021-03-06 DIAGNOSIS — R531 Weakness: Secondary | ICD-10-CM | POA: Diagnosis not present

## 2021-03-06 DIAGNOSIS — C7989 Secondary malignant neoplasm of other specified sites: Secondary | ICD-10-CM | POA: Diagnosis not present

## 2021-03-06 DIAGNOSIS — R1310 Dysphagia, unspecified: Secondary | ICD-10-CM | POA: Diagnosis not present

## 2021-03-06 DIAGNOSIS — J189 Pneumonia, unspecified organism: Secondary | ICD-10-CM | POA: Diagnosis not present

## 2021-03-09 NOTE — Telephone Encounter (Signed)
He has an appt on 8/9 and we will look at it then

## 2021-03-09 NOTE — Telephone Encounter (Signed)
Caregiver notified, take nurse look at it

## 2021-03-10 DIAGNOSIS — E890 Postprocedural hypothyroidism: Secondary | ICD-10-CM | POA: Diagnosis not present

## 2021-03-10 DIAGNOSIS — F39 Unspecified mood [affective] disorder: Secondary | ICD-10-CM | POA: Diagnosis not present

## 2021-03-10 DIAGNOSIS — Z515 Encounter for palliative care: Secondary | ICD-10-CM | POA: Diagnosis not present

## 2021-03-10 DIAGNOSIS — R531 Weakness: Secondary | ICD-10-CM | POA: Diagnosis not present

## 2021-03-10 DIAGNOSIS — C7989 Secondary malignant neoplasm of other specified sites: Secondary | ICD-10-CM | POA: Diagnosis not present

## 2021-03-10 DIAGNOSIS — J189 Pneumonia, unspecified organism: Secondary | ICD-10-CM | POA: Diagnosis not present

## 2021-03-10 DIAGNOSIS — C329 Malignant neoplasm of larynx, unspecified: Secondary | ICD-10-CM | POA: Diagnosis not present

## 2021-03-10 DIAGNOSIS — E871 Hypo-osmolality and hyponatremia: Secondary | ICD-10-CM | POA: Diagnosis not present

## 2021-03-10 DIAGNOSIS — Z931 Gastrostomy status: Secondary | ICD-10-CM | POA: Diagnosis not present

## 2021-03-10 DIAGNOSIS — E43 Unspecified severe protein-calorie malnutrition: Secondary | ICD-10-CM | POA: Diagnosis not present

## 2021-03-10 DIAGNOSIS — R1310 Dysphagia, unspecified: Secondary | ICD-10-CM | POA: Diagnosis not present

## 2021-03-10 DIAGNOSIS — J431 Panlobular emphysema: Secondary | ICD-10-CM | POA: Diagnosis not present

## 2021-03-12 DIAGNOSIS — J431 Panlobular emphysema: Secondary | ICD-10-CM | POA: Diagnosis not present

## 2021-03-12 DIAGNOSIS — E871 Hypo-osmolality and hyponatremia: Secondary | ICD-10-CM | POA: Diagnosis not present

## 2021-03-12 DIAGNOSIS — R531 Weakness: Secondary | ICD-10-CM | POA: Diagnosis not present

## 2021-03-12 DIAGNOSIS — E43 Unspecified severe protein-calorie malnutrition: Secondary | ICD-10-CM | POA: Diagnosis not present

## 2021-03-12 DIAGNOSIS — J189 Pneumonia, unspecified organism: Secondary | ICD-10-CM | POA: Diagnosis not present

## 2021-03-12 DIAGNOSIS — R1310 Dysphagia, unspecified: Secondary | ICD-10-CM | POA: Diagnosis not present

## 2021-03-12 DIAGNOSIS — E890 Postprocedural hypothyroidism: Secondary | ICD-10-CM | POA: Diagnosis not present

## 2021-03-12 DIAGNOSIS — C7989 Secondary malignant neoplasm of other specified sites: Secondary | ICD-10-CM | POA: Diagnosis not present

## 2021-03-12 DIAGNOSIS — C329 Malignant neoplasm of larynx, unspecified: Secondary | ICD-10-CM | POA: Diagnosis not present

## 2021-03-13 DIAGNOSIS — Z430 Encounter for attention to tracheostomy: Secondary | ICD-10-CM | POA: Diagnosis not present

## 2021-03-13 DIAGNOSIS — C7989 Secondary malignant neoplasm of other specified sites: Secondary | ICD-10-CM | POA: Diagnosis not present

## 2021-03-13 DIAGNOSIS — R0902 Hypoxemia: Secondary | ICD-10-CM | POA: Diagnosis not present

## 2021-03-13 DIAGNOSIS — J690 Pneumonitis due to inhalation of food and vomit: Secondary | ICD-10-CM | POA: Diagnosis not present

## 2021-03-17 ENCOUNTER — Encounter: Payer: Self-pay | Admitting: Family Medicine

## 2021-03-17 ENCOUNTER — Other Ambulatory Visit: Payer: Self-pay

## 2021-03-17 ENCOUNTER — Ambulatory Visit (INDEPENDENT_AMBULATORY_CARE_PROVIDER_SITE_OTHER): Payer: Medicare HMO | Admitting: Family Medicine

## 2021-03-17 VITALS — BP 106/64 | HR 70 | Temp 98.4°F | Resp 16 | Ht 68.0 in | Wt 116.1 lb

## 2021-03-17 DIAGNOSIS — J431 Panlobular emphysema: Secondary | ICD-10-CM | POA: Diagnosis not present

## 2021-03-17 DIAGNOSIS — D649 Anemia, unspecified: Secondary | ICD-10-CM

## 2021-03-17 DIAGNOSIS — E119 Type 2 diabetes mellitus without complications: Secondary | ICD-10-CM | POA: Diagnosis not present

## 2021-03-17 DIAGNOSIS — C7989 Secondary malignant neoplasm of other specified sites: Secondary | ICD-10-CM

## 2021-03-17 DIAGNOSIS — E782 Mixed hyperlipidemia: Secondary | ICD-10-CM | POA: Diagnosis not present

## 2021-03-17 DIAGNOSIS — C329 Malignant neoplasm of larynx, unspecified: Secondary | ICD-10-CM

## 2021-03-17 DIAGNOSIS — Z5181 Encounter for therapeutic drug level monitoring: Secondary | ICD-10-CM | POA: Diagnosis not present

## 2021-03-17 DIAGNOSIS — E440 Moderate protein-calorie malnutrition: Secondary | ICD-10-CM

## 2021-03-17 DIAGNOSIS — R32 Unspecified urinary incontinence: Secondary | ICD-10-CM

## 2021-03-17 DIAGNOSIS — Z7901 Long term (current) use of anticoagulants: Secondary | ICD-10-CM | POA: Diagnosis not present

## 2021-03-17 DIAGNOSIS — E039 Hypothyroidism, unspecified: Secondary | ICD-10-CM | POA: Diagnosis not present

## 2021-03-17 DIAGNOSIS — L8940 Pressure ulcer of contiguous site of back, buttock and hip, unspecified stage: Secondary | ICD-10-CM

## 2021-03-17 DIAGNOSIS — Z7189 Other specified counseling: Secondary | ICD-10-CM

## 2021-03-17 DIAGNOSIS — Z931 Gastrostomy status: Secondary | ICD-10-CM | POA: Diagnosis not present

## 2021-03-17 DIAGNOSIS — I10 Essential (primary) hypertension: Secondary | ICD-10-CM

## 2021-03-17 DIAGNOSIS — R630 Anorexia: Secondary | ICD-10-CM

## 2021-03-17 NOTE — Progress Notes (Signed)
Name: Neilan Rizzo   MRN: [id number]    DOB: [birth year]   Date:03/17/2021       Progress Note  Chief Complaint  Patient presents with   Diabetes   Hypertension   Hyperlipidemia   Gastroesophageal Reflux   Hypothyroidism   bed sore     Subjective:   Zong Mcquarrie is a 80 y.o. male, presents to clinic for routine f/up  Pt is working with palliative/hospice care and [facility] oncology  Hx of DM, HTN, HLD, hypothyroid On metformin - would like to get off meds if able Lab Results  Component Value Date   HGBA1C 6.3 (H) 11/26/2020  Hypothyroid, he stopped med for period of time with tx, Last thyroid labs per [facility] show TSH 30.818 and T3 55.7, pt's mood and alertness is much better and his daughter can tell he feels better can on thyroid meds - due for lab recheck today Lab Results  Component Value Date   TSH 2.76 04/21/2020    HTN on norvasc and irbesartan- meds stopped with malnutrition/weight loss BP Readings from Last 3 Encounters:  03/17/21 106/64  12/18/20 108/81  11/26/20 118/62     On zoloft for MDD from [facility]  Overall mood is a little better with zoloft and being on thyroid meds again  Sore on tailbone Pt has not hh right now?  Still doing tube feels to try and maintain weight Goals of care - to avoid pain and decrease meds if able On eliquis from [facility] due to blood clot in arm s/p procedure, no bleeding concerns or sx  Last labs from [facility] show anemia - recheck   Oncology also requests [place] manage mutliple other meds - potassium and mag supplement  They are managing pain meds gabapentin     Current Outpatient Medications:    acetaminophen (TYLENOL) 325 MG tablet, Take 2 tablets (650 mg total) by mouth every 6 (six) hours as needed for mild pain (or Fever >/= 101)., Disp: , Rfl:    amLODipine (NORVASC) 5 MG tablet, TAKE (1) TABLET BY MOUTH EVERY DAY, Disp: 90 tablet, Rfl: 3   apixaban (ELIQUIS) 5 MG TABS tablet, Take by mouth., Disp: , Rfl:    aspirin 81 MG chewable  tablet, Chew by mouth., Disp: , Rfl:    blood glucose meter kit and supplies, Accuchek or whatever is covered by insurance; check sugars once a day only if desired; Dx E11.65, LON 99 months, Disp: 1 each, Rfl: 0   folic acid (FOLVITE) 1 MG tablet, Give 1 tablet (1 mg total) by G-tube route in the morning., Disp: , Rfl:    gabapentin (NEURONTIN) 300 MG capsule, Take by mouth., Disp: , Rfl:    glucose blood test strip, Use as instructed to check sugars once a day if desired; LON 99 months, Dx E11.65, Disp: 100 each, Rfl: 3   levothyroxine (SYNTHROID) 75 MCG tablet, Take 1 tablet (75 mcg total) by mouth daily., Disp: 90 tablet, Rfl: 0   magnesium oxide (MAG-OX) 400 MG tablet, Take by mouth., Disp: , Rfl:    metFORMIN (GLUCOPHAGE) 1000 MG tablet, Take 1 tablet (1,000 mg total) by mouth daily with breakfast. (Patient taking differently: Take 1,000 mg by mouth daily.), Disp: 180 tablet, Rfl: 3   Morphine Sulfate (MORPHINE CONCENTRATE) 10 mg / 0.5 ml concentrated solution, Take 0.25 mLs (5 mg total) by mouth every 4 (four) hours as needed for severe pain., Disp: 30 mL, Rfl: 0   oxyCODONE (ROXICODONE) 5 MG/5ML solution, ,  Disp: , Rfl:    Oxycodone HCl 10 MG TABS, , Disp: , Rfl:    pantoprazole (PROTONIX) 40 MG tablet, Take 1 tablet (40 mg total) by mouth daily., Disp: 30 tablet, Rfl: 3   potassium chloride SA (KLOR-CON) 20 MEQ tablet, , Disp: , Rfl:    prochlorperazine (COMPAZINE) 10 MG tablet, , Disp: , Rfl:    rosuvastatin (CRESTOR) 5 MG tablet, TAKE ONE TABLET EVERY DAY AT BEDTIME, Disp: 90 tablet, Rfl: 3   sulfamethoxazole-trimethoprim (BACTRIM DS) 800-160 MG tablet, , Disp: , Rfl:    mirtazapine (REMERON) 15 MG tablet, Take by mouth., Disp: , Rfl:   Patient Active Problem List   Diagnosis Date Noted   Hypothyroidism 02/05/2021   Diarrhea 12/22/2020   G tube feedings ([place]) 12/22/2020   Generalized weakness 12/22/2020   Hyponatremia 12/22/2020   S/P laryngectomy 12/22/2020   Moderate  protein-calorie malnutrition ([place]) 11/26/2020   Hypokalemia 10/09/2020   Metastatic squamous cell carcinoma to head and neck ([place]) 08/22/2020   Goals of care, counseling/discussion 08/19/2020   Laryngeal cancer ([place]) 08/19/2020   Chronic arthropathy 10/25/2019   Onychomycosis of multiple toenails with type 2 diabetes mellitus ([place]) 05/16/2018   Erectile dysfunction 08/19/2015   Type 2 diabetes mellitus without complication ([place]) 83/15/1761   Tobacco abuse 04/09/2015   Hyperlipidemia 04/02/2015   Hypertension 04/02/2015   Panlobular emphysema ([place]) 04/02/2015    Past Surgical History:  Procedure Laterality Date   FOOT SURGERY      Family History  Problem Relation Age of Onset   Diabetes Mother     Social History   Tobacco Use   Smoking status: Every Day    Packs/day: 1.50    Years: 64.00    Pack years: 96.00    Types: Cigarettes   Smokeless tobacco: Never  Vaping Use   Vaping Use: Never used  Substance Use Topics   Alcohol use: No    Alcohol/week: 0.0 standard drinks   Drug use: No     No Known Allergies  Health Maintenance  Topic Date Due   URINE MICROALBUMIN  12/14/2018   OPHTHALMOLOGY EXAM  01/05/2020   FOOT EXAM  10/22/2020   PNA vac Low Risk Adult (2 of 2 - PPSV23) 03/04/2021   COVID-19 Vaccine (1) 04/02/2021 (Originally 12/20/1945)   INFLUENZA VACCINE  05/03/2021 (Originally 03/09/2021)   Zoster Vaccines- Shingrix (1 of 2) 05/08/2021 (Originally [birth year]19/1959)   HEMOGLOBIN A1C  05/28/2021   TETANUS/TDAP  07/26/2025   HPV VACCINES  Aged Out    Chart Review Today: I personally reviewed active problem list, medication list, allergies, family history, social history, health maintenance, notes from last encounter, lab results, imaging with the patient/caregiver today. Extensive chart review through care everywhere including review of multiple scans/imaging results, med list, recent lab results - more than 10 min spent on additional oncology review   Review of  Systems  Constitutional: Negative.   HENT: Negative.    Eyes: Negative.   Respiratory: Negative.    Cardiovascular: Negative.   Gastrointestinal: Negative.   Endocrine: Negative.   Genitourinary: Negative.   Musculoskeletal: Negative.   Skin: Negative.   Allergic/Immunologic: Negative.   Neurological: Negative.   Hematological: Negative.   Psychiatric/Behavioral: Negative.    All other systems reviewed and are negative.   Objective:   Vitals:   03/17/21 1506  BP: 106/64  Pulse: 70  Resp: 16  Temp: 98.4 F (36.9 C)  SpO2: 97%  Weight: 116 lb 1.6 oz (52.7 kg)  Height:  _0  (1.727 m)    Body mass index is 16.73 kg/m.  Physical Exam Vitals and nursing note reviewed.  Constitutional:      General: He is not in acute distress.    Appearance: He is cachectic. He is ill-appearing. He is not toxic-appearing or diaphoretic.  HENT:     Head: Normocephalic and atraumatic.  Eyes:     General: Lids are normal.  Neck:     Trachea: Tracheostomy present. No abnormal tracheal secretions.  Cardiovascular:     Rate and Rhythm: Normal rate and regular rhythm.     Pulses:          Radial pulses are 2+ on the right side and 2+ on the left side.     Heart sounds: Heart sounds not distant. No murmur heard.   No friction rub.  Pulmonary:     Effort: No tachypnea, accessory muscle usage or retractions.     Breath sounds: Transmitted upper airway sounds present. No wheezing or rales.  Abdominal:     General: Abdomen is flat. Bowel sounds are normal.     Palpations: Abdomen is soft.     Tenderness: There is no abdominal tenderness.     Comments: G tube dressing clean, in place, no drainage, no surrounding erythema or ttp  Musculoskeletal:     Right lower leg: No edema.     Left lower leg: No edema.  Skin:    General: Skin is dry.     Findings: Erythema and wound present.     Comments: Sacral wound and scabbing with surrounding erythema roughly 5 cm by 6 cm  Neurological:      Mental Status: He is alert.     Comments: In WC, able to stand with two person assist  Psychiatric:        Attention and Perception: Attention normal.        Mood and Affect: Mood and affect normal.        Speech: He is noncommunicative.        Behavior: Behavior is cooperative.        Assessment & Plan:   1. Type 2 diabetes mellitus without complication, without long-term current use of insulin (HCC) Recheck labs, D/C meds if able, A1C goal <8.0 - COMPLETE METABOLIC PANEL WITH GFR - Hemoglobin A1C  2. Hypothyroidism, unspecified type Back on meds, he appears much better and more alert, mood better, recheck labs and adjust dose as needed - TSH  3. Essential hypertension BP low normal  Off all meds - COMPLETE METABOLIC PANEL WITH GFR  4. Mixed hyperlipidemia Crestor 5 mg, tolerating, discussed risk vs benefit of d/c statin - will check labs and discuss  - COMPLETE METABOLIC PANEL WITH GFR  5. Encounter for medication monitoring  - COMPLETE METABOLIC PANEL WITH GFR - Hemoglobin A1C - TSH  6. Urinary incontinence, unspecified type   7. Panlobular emphysema (HCC) No current inhalers, recent pneumonia/aspiration pneumonia and hospitalization at [facility], doing better  8. Laryngeal cancer [facility]) [facility] management - s/p radiation, in the middle of repeat imaging- reviewed available recent results  9. Moderate protein-calorie malnutrition ([place]) Consult nutrition or possibly palliative care for addition support - reviewed current calorie intake and increase feed volume/calories if he is loosing weight  10. Loss of appetite Doing supplement tube feeds (daughter states continuous but doing only at night)   11. Pressure injury of skin of contiguous region involving buttock and hip, unspecified injury stage, unspecified laterality - Ambulatory referral  to [facility] and tx  12. Hypomagnesemia Recheck labs and adjust supplement as needed  - Magnesium  13. Anemia,  unspecified type Recheck past labs - CBC with Differential/Platelet  14. Goals of care, counseling/discussion Maintain weight, manage pain and anxiety Discussed palliative care vs hospice  Waiting for next scans and oncology f/up  15. Primary hypertension Off meds, BP at goal - COMPLETE METABOLIC PANEL WITH GFR  16. Metastatic squamous cell carcinoma to head and neck (HCC) See above  17. G tube feedings ([place]) See above - G-tube evaluated - COMPLETE METABOLIC PANEL WITH GFR  18. Anticoagulated Monitoring - advised pt and daughter to f/up with [facility] specialists to see if they will stop meds or continue for provoked DVT - COMPLETE METABOLIC PANEL WITH GFR - CBC with Differential/Platelet  F/up pending labs -   Delsa Grana, PA-C 03/17/21 3:14 PM

## 2021-03-18 ENCOUNTER — Other Ambulatory Visit: Payer: Self-pay | Admitting: Family Medicine

## 2021-03-18 LAB — COMPLETE METABOLIC PANEL WITH GFR
AG Ratio: 1.3 (calc) (ref 1.0–2.5)
ALT: 29 U/L (ref 9–46)
AST: 22 U/L (ref 10–35)
Albumin: 3.9 g/dL (ref 3.6–5.1)
Alkaline phosphatase (APISO): 69 U/L (ref 35–144)
BUN: 24 mg/dL (ref 7–25)
CO2: 29 mmol/L (ref 20–32)
Calcium: 9.3 mg/dL (ref 8.6–10.3)
Chloride: 97 mmol/L — ABNORMAL LOW (ref 98–110)
Creat: 0.72 mg/dL (ref 0.70–1.22)
Globulin: 3.1 g/dL (calc) (ref 1.9–3.7)
Glucose, Bld: 150 mg/dL — ABNORMAL HIGH (ref 65–99)
Potassium: 4.8 mmol/L (ref 3.5–5.3)
Sodium: 135 mmol/L (ref 135–146)
Total Bilirubin: 0.4 mg/dL (ref 0.2–1.2)
Total Protein: 7 g/dL (ref 6.1–8.1)
eGFR: 91 mL/min/{1.73_m2} (ref >=60)

## 2021-03-18 LAB — CBC WITH DIFFERENTIAL/PLATELET
Absolute Monocytes: 416 cells/uL (ref 200–950)
Basophils Absolute: 31 cells/uL (ref 0–200)
Basophils Relative: 0.6 %
Eosinophils Absolute: 109 cells/uL (ref 15–500)
Eosinophils Relative: 2.1 %
HCT: 34.6 % — ABNORMAL LOW (ref 38.5–50.0)
Hemoglobin: 11.3 g/dL — ABNORMAL LOW (ref 13.2–17.1)
Lymphs Abs: 598 cells/uL — ABNORMAL LOW (ref 850–3900)
MCH: 30.5 pg (ref 27.0–33.0)
MCHC: 32.7 g/dL (ref 32.0–36.0)
MCV: 93.3 fL (ref 80.0–100.0)
MPV: 10.8 fL (ref 7.5–12.5)
Monocytes Relative: 8 %
Neutro Abs: 4046 cells/uL (ref 1500–7800)
Neutrophils Relative %: 77.8 %
Platelets: 213 10*3/uL (ref 140–400)
RBC: 3.71 10*6/uL — ABNORMAL LOW (ref 4.20–5.80)
RDW: 14.2 % (ref 11.0–15.0)
Total Lymphocyte: 11.5 %
WBC: 5.2 10*3/uL (ref 3.8–10.8)

## 2021-03-18 LAB — HEMOGLOBIN A1C
Hgb A1c MFr Bld: 6 % of total Hgb — ABNORMAL HIGH (ref <5.7)
Mean Plasma Glucose: 126 mg/dL
eAG (mmol/L): 7 mmol/L

## 2021-03-18 LAB — MAGNESIUM: Magnesium: 1.9 mg/dL (ref 1.5–2.5)

## 2021-03-18 LAB — TSH: TSH: 2.42 mIU/L (ref 0.40–4.50)

## 2021-03-18 MED ORDER — LEVOTHYROXINE SODIUM 75 MCG PO TABS: 75.0000 ug | ORAL_TABLET | Freq: Every day | ORAL | 1 refills | Status: DC

## 2021-03-18 MED ORDER — LORAZEPAM 0.5 MG PO TABS: 0.5000 mg | ORAL_TABLET | Freq: Three times a day (TID) | ORAL | 1 refills | Status: DC | PRN

## 2021-03-18 MED ORDER — MAGNESIUM OXIDE 400 MG PO TABS: 400.0000 mg | ORAL_TABLET | Freq: Every day | ORAL | 1 refills | Status: DC

## 2021-03-19 ENCOUNTER — Other Ambulatory Visit: Payer: Self-pay

## 2021-03-19 ENCOUNTER — Ambulatory Visit (INDEPENDENT_AMBULATORY_CARE_PROVIDER_SITE_OTHER): Payer: Medicare HMO | Admitting: Podiatry

## 2021-03-19 ENCOUNTER — Encounter: Payer: Self-pay | Admitting: Podiatry

## 2021-03-19 DIAGNOSIS — B351 Tinea unguium: Secondary | ICD-10-CM | POA: Diagnosis not present

## 2021-03-19 DIAGNOSIS — E1142 Type 2 diabetes mellitus with diabetic polyneuropathy: Secondary | ICD-10-CM | POA: Diagnosis not present

## 2021-03-19 DIAGNOSIS — M19071 Primary osteoarthritis, right ankle and foot: Secondary | ICD-10-CM

## 2021-03-19 DIAGNOSIS — M79676 Pain in unspecified toe(s): Secondary | ICD-10-CM | POA: Diagnosis not present

## 2021-03-19 NOTE — Progress Notes (Signed)
Subjective:  Patient ID: Connor Aguilar, male    DOB: 08-Nov-1940,  MRN: [id number]  Chief Complaint  Patient presents with   Diabetes    "He's complaining about his toes hurting.  He has a problem with peeling."    80 y.o. male presents with the above complaint.  Patient presents with follow-up to right foot midfoot arthritis.  Patient states the injection does help.  He was in the hospital for throat cancer for which he was recovering despite has not been seen in a while.  Patient also has secondary complaint of thickened elongated dystrophic toenails x10 for which he would like to have them debrided down.  He denies any other acute complaints he has not seen anyone else in the interim.   Review of Systems: Negative except as noted in the HPI. Denies N/V/F/Ch.  Past Medical History:  Diagnosis Date   Allergy    Diabetes mellitus without complication ([place])    Elevated CPK 11/18/2015   Hyperlipidemia    Hypertension    Tobacco abuse     Current Outpatient Medications:    acetaminophen (TYLENOL) 325 MG tablet, Take 2 tablets (650 mg total) by mouth every 6 (six) hours as needed for mild pain (or Fever >/= 101)., Disp: , Rfl:    apixaban (ELIQUIS) 5 MG TABS tablet, Take by mouth., Disp: , Rfl:    blood glucose meter kit and supplies, Accuchek or whatever is covered by insurance; check sugars once a day only if desired; Dx E11.65, LON 99 months, Disp: 1 each, Rfl: 0   folic acid (FOLVITE) 1 MG tablet, Give 1 tablet (1 mg total) by G-tube route in the morning., Disp: , Rfl:    gabapentin (NEURONTIN) 300 MG capsule, Take by mouth., Disp: , Rfl:    glucose blood test strip, Use as instructed to check sugars once a day if desired; LON 99 months, Dx E11.65, Disp: 100 each, Rfl: 3   levothyroxine (SYNTHROID) 75 MCG tablet, Take 1 tablet (75 mcg total) by mouth daily., Disp: 90 tablet, Rfl: 1   LORazepam (ATIVAN) 0.5 MG tablet, Take 1 tablet (0.5 mg total) by mouth every 8 (eight) hours as  needed for anxiety (N/V)., Disp: 30 tablet, Rfl: 1   magnesium oxide (MAG-OX) 400 MG tablet, Place 1 tablet (400 mg total) into feeding tube daily., Disp: 90 tablet, Rfl: 1   Oxycodone HCl 10 MG TABS, , Disp: , Rfl:    pantoprazole (PROTONIX) 40 MG tablet, Take 1 tablet (40 mg total) by mouth daily., Disp: 30 tablet, Rfl: 3   polyethylene glycol powder (GLYCOLAX/MIRALAX) 17 GM/SCOOP powder, Take by mouth., Disp: , Rfl:    potassium chloride SA (KLOR-CON) 20 MEQ tablet, , Disp: , Rfl:    rosuvastatin (CRESTOR) 5 MG tablet, TAKE ONE TABLET EVERY DAY AT BEDTIME, Disp: 90 tablet, Rfl: 3   sertraline (ZOLOFT) 25 MG tablet, , Disp: , Rfl:    sodium chloride HYPERTONIC 3 % nebulizer solution, Inhale into the lungs., Disp: , Rfl:   Social History   Tobacco Use  Smoking Status Every Day   Packs/day: 1.50   Years: 64.00   Pack years: 96.00   Types: Cigarettes  Smokeless Tobacco Never    No Known Allergies Objective:  There were no vitals filed for this visit. There is no height or weight on file to calculate BMI. Constitutional Well developed. Well nourished.  Vascular Dorsalis pedis pulses palpable bilaterally. Posterior tibial pulses palpable bilaterally. Capillary refill normal to all digits.  No cyanosis or clubbing noted. Pedal hair growth normal.  Neurologic Normal speech. Oriented to person, place, and time. Epicritic sensation to light touch grossly present bilaterally.  Dermatologic  thickened elongated dystrophic mycotic discolored toenails x10.  Painful to touch. No open wounds. No skin lesions.  Orthopedic:  Right first tarsometatarsal joint arthritis.  Pain on palpation severely.  Pain along the dorsal midfoot as well.   Radiographs: 3 views of skeletally mature adult foot: Severe arthritic changes noted of the first tarsometatarsal joint as well as the rest of the other tarsometatarsal joint.  Arthritis of the midfoot noted.  No other bony abnormalities identified.  There  is exostosis present at the first tarsometatarsal joint. Assessment:   1. Diabetic polyneuropathy associated with type 2 diabetes mellitus ([place])   2. Pain due to onychomycosis of toenail   3. Arthritis of right foot    Plan:  Patient was evaluated and treated and all questions answered.  Right midfoot arthritis/exostosis at first tarsometatarsal joint slowly improving -I explained to the patient the etiology of arthritis and given that he has a history of previous Lisfranc injury is likely the etiology and the cause of the arthritic changes.  I believe patient will benefit from a steroid injection to decrease the inflammatory component associated with arthritis as well as swelling.  Patient agrees with the plan would like to proceed with injection. -A continued steroid injection was performed at right dorsal first tarsometatarsal joint using 1% plain Lidocaine and 10 mg of Kenalog. This was well tolerated. -If the injection helps resolve the pain I do not mind doing the injections every 6 weeks to 8 weeks.  However if the pain is not improving will consider also different type of bracing to help with the pain.  Onychomycosis with pain  -Nails palliatively debrided as below. -Educated on self-care  Procedure: Nail Debridement Rationale: pain  Type of Debridement: manual, sharp debridement. Instrumentation: Nail nipper, rotary burr. Number of Nails: 10  Procedures and Treatment: Consent by patient was obtained for treatment procedures. The patient understood the discussion of treatment and procedures well. All questions were answered thoroughly reviewed. Debridement of mycotic and hypertrophic toenails, 1 through 5 bilateral and clearing of subungual debris. No ulceration, no infection noted.  Return Visit-Office Procedure: Patient instructed to return to the office for a follow up visit 3 months for continued evaluation and treatment.  Boneta Lucks, DPM    No follow-ups on file.   No  follow-ups on file.

## 2021-03-24 ENCOUNTER — Encounter: Payer: Self-pay | Admitting: Family Medicine

## 2021-03-25 ENCOUNTER — Telehealth: Payer: Self-pay

## 2021-03-25 NOTE — Telephone Encounter (Signed)
Yes

## 2021-03-25 NOTE — Telephone Encounter (Signed)
Copied from [place] [id number]. Topic: General - Other >> Mar 25, 2021  8:38 AM Leward Quan A wrote: Reason for CRM: Ronalee Belts with [employer] called in to inform Delsa Grana that referral was received but company does not accept patient that have had Tracheotomy. Per Ronalee Belts the information came that patient has a Warden/ranger but their company does not accept but state that if this is incorrect information please call Ronalee Belts and update. Can be reached at  Ph# [phone]

## 2021-03-28 DIAGNOSIS — Z430 Encounter for attention to tracheostomy: Secondary | ICD-10-CM | POA: Diagnosis not present

## 2021-03-28 DIAGNOSIS — R0902 Hypoxemia: Secondary | ICD-10-CM | POA: Diagnosis not present

## 2021-03-28 DIAGNOSIS — C7989 Secondary malignant neoplasm of other specified sites: Secondary | ICD-10-CM | POA: Diagnosis not present

## 2021-03-28 DIAGNOSIS — J690 Pneumonitis due to inhalation of food and vomit: Secondary | ICD-10-CM | POA: Diagnosis not present

## 2021-03-29 DIAGNOSIS — R0902 Hypoxemia: Secondary | ICD-10-CM | POA: Diagnosis not present

## 2021-03-29 DIAGNOSIS — C7989 Secondary malignant neoplasm of other specified sites: Secondary | ICD-10-CM | POA: Diagnosis not present

## 2021-03-29 DIAGNOSIS — Z430 Encounter for attention to tracheostomy: Secondary | ICD-10-CM | POA: Diagnosis not present

## 2021-03-29 DIAGNOSIS — J690 Pneumonitis due to inhalation of food and vomit: Secondary | ICD-10-CM | POA: Diagnosis not present

## 2021-03-31 DIAGNOSIS — C7989 Secondary malignant neoplasm of other specified sites: Secondary | ICD-10-CM | POA: Diagnosis not present

## 2021-03-31 DIAGNOSIS — Z430 Encounter for attention to tracheostomy: Secondary | ICD-10-CM | POA: Diagnosis not present

## 2021-04-02 NOTE — Telephone Encounter (Signed)
Caregiver April calling b/c they have NOT heard anything from a wound care provider. Offered the phone number where the referral was sent, and I has been 2 weeks, and the referral was just sent 8/23.  But she says she does not want anyone from [facility] coming out, because last time hteir nurse came out they did not do anything for the pt except look at him.  She states she wants an actual wound care person to come and help the pt. She also request you call her back for questions.  [phone]

## 2021-04-05 DIAGNOSIS — C7989 Secondary malignant neoplasm of other specified sites: Secondary | ICD-10-CM | POA: Diagnosis not present

## 2021-04-05 DIAGNOSIS — Z430 Encounter for attention to tracheostomy: Secondary | ICD-10-CM | POA: Diagnosis not present

## 2021-04-07 DIAGNOSIS — C7989 Secondary malignant neoplasm of other specified sites: Secondary | ICD-10-CM | POA: Diagnosis not present

## 2021-04-07 DIAGNOSIS — Z430 Encounter for attention to tracheostomy: Secondary | ICD-10-CM | POA: Diagnosis not present

## 2021-04-08 DIAGNOSIS — R918 Other nonspecific abnormal finding of lung field: Secondary | ICD-10-CM | POA: Diagnosis not present

## 2021-04-08 DIAGNOSIS — Z8521 Personal history of malignant neoplasm of larynx: Secondary | ICD-10-CM | POA: Diagnosis not present

## 2021-04-08 DIAGNOSIS — C329 Malignant neoplasm of larynx, unspecified: Secondary | ICD-10-CM | POA: Diagnosis not present

## 2021-04-08 DIAGNOSIS — J479 Bronchiectasis, uncomplicated: Secondary | ICD-10-CM | POA: Diagnosis not present

## 2021-04-08 DIAGNOSIS — E079 Disorder of thyroid, unspecified: Secondary | ICD-10-CM | POA: Diagnosis not present

## 2021-04-08 DIAGNOSIS — J90 Pleural effusion, not elsewhere classified: Secondary | ICD-10-CM | POA: Diagnosis not present

## 2021-04-08 DIAGNOSIS — B370 Candidal stomatitis: Secondary | ICD-10-CM | POA: Diagnosis not present

## 2021-04-08 DIAGNOSIS — Z9089 Acquired absence of other organs: Secondary | ICD-10-CM | POA: Diagnosis not present

## 2021-04-08 DIAGNOSIS — Z930 Tracheostomy status: Secondary | ICD-10-CM | POA: Diagnosis not present

## 2021-04-08 DIAGNOSIS — Z9889 Other specified postprocedural states: Secondary | ICD-10-CM | POA: Diagnosis not present

## 2021-04-08 DIAGNOSIS — E890 Postprocedural hypothyroidism: Secondary | ICD-10-CM | POA: Diagnosis not present

## 2021-04-08 DIAGNOSIS — Z08 Encounter for follow-up examination after completed treatment for malignant neoplasm: Secondary | ICD-10-CM | POA: Diagnosis not present

## 2021-04-08 DIAGNOSIS — M7989 Other specified soft tissue disorders: Secondary | ICD-10-CM | POA: Diagnosis not present

## 2021-04-08 DIAGNOSIS — Z9002 Acquired absence of larynx: Secondary | ICD-10-CM | POA: Diagnosis not present

## 2021-04-10 DIAGNOSIS — Z430 Encounter for attention to tracheostomy: Secondary | ICD-10-CM | POA: Diagnosis not present

## 2021-04-10 DIAGNOSIS — J690 Pneumonitis due to inhalation of food and vomit: Secondary | ICD-10-CM | POA: Diagnosis not present

## 2021-04-10 DIAGNOSIS — C7989 Secondary malignant neoplasm of other specified sites: Secondary | ICD-10-CM | POA: Diagnosis not present

## 2021-04-10 DIAGNOSIS — R0902 Hypoxemia: Secondary | ICD-10-CM | POA: Diagnosis not present

## 2021-04-14 ENCOUNTER — Telehealth: Payer: Self-pay

## 2021-04-14 NOTE — Progress Notes (Signed)
    Chronic Care Management Pharmacy Assistant   Name: Connor Aguilar  MRN: [id number] DOB: 1941/05/11  Patient called to be reminded of his appointment with [name], CPP on 04/25/2021 '@1000'$  via telephone.   Patient's caregiver aware of appointment date, time, and type of appointment (either telephone or in person). Patient aware to have/bring all medications, supplements, blood pressure and/or blood sugar logs to visit.  Questions: Are there any concerns you would like to discuss during your office visit? Patient  has no voice as he had surgery and had all that removed. The caregiver April will be the one taking the call tomorrow. She stated that the patient blood pressure has been elevated and she would like to discuss that. The patient is only able to eat broth so she feels that may be the reason for the elevation as they are giving him campbell's soup and she wasn't putting water in it. She will have numbers available for you tomorrow.    Are you having any problems obtaining your medications? No  Star Rating Drug: Rosuvastatin 5 mg last filled on 12/01/2020 for a 90-Day supply with [facility]  Any gaps in medications fill history? Yes  Care Gaps: [id number] Vaccine Urine Microalbumin Ophthalmology Exam (last completed 11/05/2018) Foot Exam (last completed 10/23/2019) PNA Vaccine 2nd Dose (last completed on 03/04/2020)  Lynann Bologna, CPA/CMA Clinical Pharmacist Assistant Phone: [phone]

## 2021-04-15 ENCOUNTER — Ambulatory Visit (INDEPENDENT_AMBULATORY_CARE_PROVIDER_SITE_OTHER): Payer: Medicare HMO

## 2021-04-15 DIAGNOSIS — E119 Type 2 diabetes mellitus without complications: Secondary | ICD-10-CM

## 2021-04-15 DIAGNOSIS — I10 Essential (primary) hypertension: Secondary | ICD-10-CM

## 2021-04-15 NOTE — Progress Notes (Signed)
Chronic Care Management Pharmacy Note  05/08/2021 Name:  Connor Aguilar MRN:  [id number] DOB:  1941/03/23  Summary: Patient presents for CCM follow-up. Patient reports doing well overall. He has a fill gap on his rosuvastatin due to a backlog of supply at home.    Recommendations/Changes made from today's visit: Continue current medications  Counseled on utilizing pillbox to organize medications  Plan: CPP follow-up 6 months   Subjective: Connor Aguilar is an 80 y.o. year old male who is a primary patient of Delsa Grana, [place].  The CCM team was consulted for assistance with disease management and care coordination needs.    Engaged with patient by telephone for follow up visit in response to provider referral for pharmacy case management and/or care coordination services.   Consent to Services:  The patient was given information about Chronic Care Management services, agreed to services, and gave verbal consent prior to initiation of services.  Please see initial visit note for detailed documentation.   Patient Care Team: Delsa Grana, PA-C as PCP - General (Family Medicine) Germaine Pomfret, [facility] as Pharmacist (Pharmacist) Simonne Martinet, DO as Referring Physician (Hematology and Oncology) Clare Gandy, MD as Referring Physician (Radiation Oncology) Lennox Grumbles, MD as Referring Physician (Otolaryngology)  Recent office visits: 03/17/21: Patient presented to Delsa Grana, PA-C for follow-up. Amlodipine, aspirin, mirtazapine, morphine, prochlorperazine, Bactrim stopped.  01/28/21: Patient presented to Delsa Grana, PA-C for hospital follow-up.  Recent consult visits: 03/10/21: Patient presented to Melanie Crazier, FNP (Palliative) for malnutrition follow-up.  12/08/20: Patient presented to Stark Bray, ANP (Speech therapy)  11/18/20: Patient presented to Dr. Harriette Ohara (Oncology) for follow-up. Eliquis for right arm DVT   Hospital visits: None in previous 6  months   Objective:  Lab Results  Component Value Date   CREATININE 0.72 03/17/2021   BUN 24 03/17/2021   GFRNONAA >60 08/19/2020   GFRAA 93 04/21/2020   NA 135 03/17/2021   K 4.8 03/17/2021   CALCIUM 9.3 03/17/2021   CO2 29 03/17/2021   GLUCOSE 150 (H) 03/17/2021    Lab Results  Component Value Date/Time   HGBA1C 6.0 (H) 03/17/2021 04:07 PM   HGBA1C 6.3 (H) 11/26/2020 11:46 AM   MICROALBUR 0.2 12/13/2017 02:45 PM   MICROALBUR neg 11/18/2015 03:45 PM    Last diabetic Eye exam:  Lab Results  Component Value Date/Time   HMDIABEYEEXA No Retinopathy 01/05/2019 12:00 AM    Last diabetic Foot exam: No results found for: HMDIABFOOTEX   Lab Results  Component Value Date   CHOL 124 11/26/2020   HDL 72 11/26/2020   LDLCALC 33 11/26/2020   TRIG 100 11/26/2020   CHOLHDL 1.7 11/26/2020    Hepatic Function Latest Ref Rng & Units 03/17/2021 08/19/2020 04/21/2020  Total Protein 6.1 - 8.1 g/dL 7.0 7.0 7.0  Albumin 3.5 - 5.0 g/dL - 3.9 -  AST 10 - 35 U/L _0 ALT 9 - 46 U/L _1 Alk Phosphatase 38 - 126 U/L - 41 -  Total Bilirubin 0.2 - 1.2 mg/dL 0.4 0.6 0.9    Lab Results  Component Value Date/Time   TSH 2.42 03/17/2021 04:07 PM   TSH 2.76 04/21/2020 03:48 PM    CBC Latest Ref Rng & Units 03/17/2021 08/19/2020 04/21/2020  WBC 3.8 - 10.8 Thousand/uL 5.2 10.7(H) 11.2(H)  Hemoglobin 13.2 - 17.1 g/dL 11.3(L) 13.2 14.6  Hematocrit 38.5 - 50.0 % 34.6(L) 38.1(L) 42.9  Platelets 140 - 400 Thousand/uL 213 340  414(H)    No results found for: VD25OH  Clinical ASCVD: No  The ASCVD Risk score (Arnett DK, et al., 2019) failed to calculate for the following reasons:   The 2019 ASCVD risk score is only valid for ages 48 to 48    Depression screen PHQ 2/9 03/17/2021 02/05/2021 01/28/2021  Decreased Interest 0 0 0  Down, Depressed, Hopeless 0 0 0  PHQ - 2 Score 0 0 0  Altered sleeping 0 - 1  Tired, decreased energy 0 - 0  Change in appetite 0 - 0  Feeling bad or failure about  yourself  0 - 0  Trouble concentrating 0 - 0  Moving slowly or fidgety/restless 0 - 0  Suicidal thoughts 0 - 0  PHQ-9 Score 0 - 1  Difficult doing work/chores Not difficult at all - Not difficult at all  Some recent data might be hidden    Social History   Tobacco Use  Smoking Status Former   Packs/day: 1.50   Years: 64.00   Pack years: 96.00   Types: Cigarettes   Start date: 08/09/1956   Quit date: 08/09/2020   Years since quitting: 0.7  Smokeless Tobacco Never   BP Readings from Last 3 Encounters:  03/17/21 106/64  12/18/20 108/81  11/26/20 118/62   Pulse Readings from Last 3 Encounters:  03/17/21 70  12/18/20 84  11/26/20 60   Wt Readings from Last 3 Encounters:  03/17/21 116 lb 1.6 oz (52.7 kg)  12/18/20 110 lb (49.9 kg)  11/26/20 117 lb 9.6 oz (53.3 kg)   BMI Readings from Last 3 Encounters:  03/17/21 17.65 kg/m  12/18/20 16.73 kg/m  11/26/20 17.88 kg/m    Assessment/Interventions: Review of patient past medical history, allergies, medications, health status, including review of consultants reports, laboratory and other test data, was performed as part of comprehensive evaluation and provision of chronic care management services.   SDOH:  (Social Determinants of Health) assessments and interventions performed: Yes SDOH Interventions    Flowsheet Row Most Recent Value  SDOH Interventions   Financial Strain Interventions Intervention Not Indicated      SDOH Screenings   Alcohol Screen: Low Risk    Last Alcohol Screening Score (AUDIT): 0  Depression (PHQ2-9): Low Risk    PHQ-2 Score: 0  Financial Resource Strain: Low Risk    Difficulty of Paying Living Expenses: Not hard at all  Food Insecurity: No Food Insecurity   Worried About Charity fundraiser in the Last Year: Never true   Ran Out of Food in the Last Year: Never true  Housing: [place] Risk Score: 0  Physical Activity: Inactive   Days of Exercise per Week: 0 days   Minutes of  Exercise per Session: 0 min  Social Connections: Unknown   Frequency of Communication with Friends and Family: Patient refused   Frequency of Social Gatherings with Friends and Family: Patient refused   Attends Religious Services: Patient refused   Active Member of Clubs or Organizations: Patient refused   Attends Archivist Meetings: Patient refused   Marital Status: Widowed  Stress: No Stress Concern Present   Feeling of Stress : Not at all  Tobacco Use: Medium Risk   Smoking Tobacco Use: Former   Smokeless Tobacco Use: Never  Transportation Needs: No Data processing manager (Medical): No   Lack of Transportation (Non-Medical): No    CCM Care Plan  No Known Allergies  Medications  Reviewed Today     Reviewed by Germaine Pomfret, [facility] (Pharmacist) on 04/15/21 at 52  Med List Status: <None>   Medication Order Taking? Sig Documenting Provider Last Dose Status Informant  acetaminophen (TYLENOL) 325 MG tablet [id number]  Take 2 tablets (650 mg total) by mouth every 6 (six) hours as needed for mild pain (or Fever >/= 101). Nicholes Mango, MD  Active   blood glucose meter kit and supplies [id number]  Accuchek or whatever is covered by insurance; check sugars once a day only if desired; Dx E11.65, LON 99 months Lada, Satira Anis, MD  Active   folic acid (FOLVITE) 1 MG tablet [id number] Yes Give 1 tablet (1 mg total) by G-tube route in the morning. [provider] Taking Active   gabapentin (NEURONTIN) 100 MG capsule [id number] Yes Take 100 mg by mouth 3 (three) times daily. [provider] Taking Active   glucose blood test strip [id number]  Use as instructed to check sugars once a day if desired; LON 99 months, Dx E11.65 Arnetha Courser, MD  Active   levothyroxine (SYNTHROID) 75 MCG tablet [id number] Yes Take 1 tablet (75 mcg total) by mouth daily. Delsa Grana, PA-C Taking Active   LORazepam (ATIVAN) 0.5 MG tablet [id number] Yes Take 1 tablet  (0.5 mg total) by mouth every 8 (eight) hours as needed for anxiety (N/V). Delsa Grana, PA-C Taking Active   magnesium oxide (MAG-OX) 400 MG tablet [id number] Yes Place 1 tablet (400 mg total) into feeding tube daily. Delsa Grana, PA-C Taking Active   Oxycodone HCl 10 MG TABS [id number] Yes Take 10 mg by mouth every 4 (four) hours as needed. [provider] Taking Active   pantoprazole (PROTONIX) 40 MG tablet [id number]  Take 1 tablet (40 mg total) by mouth daily. Delsa Grana, PA-C  Active   polyethylene glycol powder (GLYCOLAX/MIRALAX) 17 GM/SCOOP powder [id number]  Take by mouth. [provider]  Active   potassium chloride SA (KLOR-CON) 20 MEQ tablet [id number]   [provider]  Active   rosuvastatin (CRESTOR) 5 MG tablet [id number]  TAKE ONE TABLET EVERY DAY AT BEDTIME Delsa Grana, PA-C  Active   sertraline (ZOLOFT) 25 MG tablet [id number]   [provider]  Active             Patient Active Problem List   Diagnosis Date Noted   Hypothyroidism 02/05/2021   Diarrhea 12/22/2020   G tube feedings ([place]) 12/22/2020   Generalized weakness 12/22/2020   Hyponatremia 12/22/2020   S/P laryngectomy 12/22/2020   Moderate protein-calorie malnutrition ([place]) 11/26/2020   Hypokalemia 10/09/2020   Metastatic squamous cell carcinoma to head and neck ([place]) 08/22/2020   Goals of care, counseling/discussion 08/19/2020   Laryngeal cancer ([place]) 08/19/2020   Chronic arthropathy 10/25/2019   Onychomycosis of multiple toenails with type 2 diabetes mellitus ([place]) 05/16/2018   Erectile dysfunction 08/19/2015   Type 2 diabetes mellitus without complication ([place]) 83/81/8403   Tobacco abuse 04/09/2015   Hyperlipidemia 04/02/2015   Hypertension 04/02/2015   Panlobular emphysema ([place]) 04/02/2015    Immunization History  Administered Date(s) Administered   Pneumococcal Conjugate-13 03/04/2020   Tdap 07/27/2015    Conditions to be addressed/monitored:  Hypertension,  Hyperlipidemia, Diabetes, Hypothyroidism, Anxiety, and metastatic squamous cell carcinoma  Care Plan : General Pharmacy (Adult)  Updates made by Germaine Pomfret, [place] since 05/08/2021 12:00 AM     Problem: Hypertension, Hyperlipidemia, Diabetes, Hypothyroidism, Anxiety, and metastatic squamous cell carcinoma   Priority: High  Long-Range Goal: Patient-Specific Goal   Start Date: 05/08/2021  Expected End Date: 05/08/2022  This Visit's Progress: On track  Priority: High  Note:   Current Barriers:  Unable to self administer medications as prescribed  Pharmacist Clinical Goal(s):  Patient will achieve ability to self administer medications as prescribed through use of pillbox as evidenced by patient report through collaboration with PharmD and provider.   Interventions: 1:1 collaboration with Delsa Grana, PA-C regarding development and update of comprehensive plan of care as evidenced by provider attestation and co-signature Inter-disciplinary care team collaboration (see longitudinal plan of care) Comprehensive medication review performed; medication list updated in electronic medical record  Hypertension (BP goal <140/90) -Controlled -Current treatment: None -Medications previously tried: Amlodipine, Irbesartan, HCTZ,  -Current home readings: 138/79, 131/79,  -Current dietary habits: [employer] (high sodium)  -Denies hypotensive/hypertensive symptoms -Recommended to continue current medication  Hyperlipidemia: (LDL goal < 100) -Controlled -Current treatment: Rosuvastatin 5 mg nightly  -Medications previously tried: NA -CT Scan Aug 2022 showed coronary plaque -Recommended to continue current medication  Diabetes (A1c goal <7%) -Controlled -Current medications: None -Medications previously tried: Metformin (dysphagia)   -Denies hypoglycemic/hyperglycemic symptoms -Recommended to continue current medication  Depression/Anxiety (Goal: maintain symptom  remission) -Controlled -Current treatment: Lorazepam 0.5 mg every 8 hours as needed  Sertraline 25 mg daily  -Medications previously tried/failed: NA -PHQ9: 0 -GAD7: 0 -Recommended to continue current medication  Hypothyroidism (Goal: Maintain normal thyroid function) -Controlled -Current treatment  Levothyroxine 75 mcg daily  -Medications previously tried: NA  -Gives one hour after finishing enteral feeding. -Recommended to continue current medication  Neuropathy (Goal: Minimize pain symptoms) -Not ideally controlled -Managed by Oncology -Current treatment  Gabapentin 100 mg three times daily  -Medications previously tried: NA -Patient previously well controlled on 300 mg gabapentin, decreased during hospitalization due to concerns of affecting alertness. Caregiver struggles with three times daily dosing of gabapentin, and patient feels lower dose not as effective for helping with numbness.   -Recommend caregiver reach out to oncology to resume gabapentin 300 mg 2-3 times daily. If once daily dosing is preferred, could switch to Lyrica instead.    Patient Goals/Self-Care Activities Patient will:  - check blood pressure twice weekly, document, and provide at future appointments  Follow Up Plan: Telephone follow up appointment with care management team member scheduled for:  10/14/2021 at 3:00 PM      Medication Assistance: None required.  Patient affirms current coverage meets needs.  Compliance/Adherence/Medication fill history: Care Gaps: Covid Urine Microalbumin  Influenza   Star-Rating Drugs: Rosuvastatin 5 mg last filled on 12/01/2020 for a 90-Day supply with [facility]. -Discussed with patient, has backlog of supply  Patient's preferred pharmacy is:  [facility], [place] [address] [place] [address] Phone: [phone] Fax: [phone]  Uses pill box? Yes Pt endorses 100% compliance  We discussed: Current pharmacy is  preferred with insurance plan and patient is satisfied with pharmacy services Patient decided to: Continue current medication management strategy  Care Plan and Follow Up Patient Decision:  Patient agrees to Care Plan and Follow-up.  Plan: Telephone follow up appointment with care management team member scheduled for:  10/14/2021 at 3:00 PM  Malva Limes, [facility] [phone]

## 2021-04-17 DIAGNOSIS — Z9002 Acquired absence of larynx: Secondary | ICD-10-CM | POA: Diagnosis not present

## 2021-04-17 DIAGNOSIS — Z8521 Personal history of malignant neoplasm of larynx: Secondary | ICD-10-CM | POA: Diagnosis not present

## 2021-04-21 DIAGNOSIS — Z8521 Personal history of malignant neoplasm of larynx: Secondary | ICD-10-CM | POA: Diagnosis not present

## 2021-04-21 DIAGNOSIS — Z9002 Acquired absence of larynx: Secondary | ICD-10-CM | POA: Diagnosis not present

## 2021-04-21 DIAGNOSIS — Z448 Encounter for fitting and adjustment of other external prosthetic devices: Secondary | ICD-10-CM | POA: Diagnosis not present

## 2021-04-28 DIAGNOSIS — E119 Type 2 diabetes mellitus without complications: Secondary | ICD-10-CM | POA: Diagnosis not present

## 2021-04-28 DIAGNOSIS — C7989 Secondary malignant neoplasm of other specified sites: Secondary | ICD-10-CM | POA: Diagnosis not present

## 2021-04-28 DIAGNOSIS — J690 Pneumonitis due to inhalation of food and vomit: Secondary | ICD-10-CM | POA: Diagnosis not present

## 2021-04-28 DIAGNOSIS — Z87891 Personal history of nicotine dependence: Secondary | ICD-10-CM | POA: Diagnosis not present

## 2021-04-28 DIAGNOSIS — I10 Essential (primary) hypertension: Secondary | ICD-10-CM | POA: Diagnosis not present

## 2021-04-28 DIAGNOSIS — R0902 Hypoxemia: Secondary | ICD-10-CM | POA: Diagnosis not present

## 2021-04-28 DIAGNOSIS — Z430 Encounter for attention to tracheostomy: Secondary | ICD-10-CM | POA: Diagnosis not present

## 2021-04-28 DIAGNOSIS — Z448 Encounter for fitting and adjustment of other external prosthetic devices: Secondary | ICD-10-CM | POA: Diagnosis not present

## 2021-04-28 DIAGNOSIS — C329 Malignant neoplasm of larynx, unspecified: Secondary | ICD-10-CM | POA: Diagnosis not present

## 2021-04-29 DIAGNOSIS — J690 Pneumonitis due to inhalation of food and vomit: Secondary | ICD-10-CM | POA: Diagnosis not present

## 2021-04-29 DIAGNOSIS — R0902 Hypoxemia: Secondary | ICD-10-CM | POA: Diagnosis not present

## 2021-04-29 DIAGNOSIS — C7989 Secondary malignant neoplasm of other specified sites: Secondary | ICD-10-CM | POA: Diagnosis not present

## 2021-04-29 DIAGNOSIS — Z430 Encounter for attention to tracheostomy: Secondary | ICD-10-CM | POA: Diagnosis not present

## 2021-05-05 DIAGNOSIS — C7989 Secondary malignant neoplasm of other specified sites: Secondary | ICD-10-CM | POA: Diagnosis not present

## 2021-05-05 DIAGNOSIS — Z430 Encounter for attention to tracheostomy: Secondary | ICD-10-CM | POA: Diagnosis not present

## 2021-05-05 DIAGNOSIS — J690 Pneumonitis due to inhalation of food and vomit: Secondary | ICD-10-CM | POA: Diagnosis not present

## 2021-05-05 DIAGNOSIS — Z79899 Other long term (current) drug therapy: Secondary | ICD-10-CM | POA: Diagnosis not present

## 2021-05-05 DIAGNOSIS — R0902 Hypoxemia: Secondary | ICD-10-CM | POA: Diagnosis not present

## 2021-05-05 DIAGNOSIS — Z431 Encounter for attention to gastrostomy: Secondary | ICD-10-CM | POA: Diagnosis not present

## 2021-05-05 DIAGNOSIS — Z79890 Hormone replacement therapy (postmenopausal): Secondary | ICD-10-CM | POA: Diagnosis not present

## 2021-05-05 DIAGNOSIS — C760 Malignant neoplasm of head, face and neck: Secondary | ICD-10-CM | POA: Diagnosis not present

## 2021-05-05 DIAGNOSIS — Z434 Encounter for attention to other artificial openings of digestive tract: Secondary | ICD-10-CM | POA: Diagnosis not present

## 2021-05-05 DIAGNOSIS — Z7982 Long term (current) use of aspirin: Secondary | ICD-10-CM | POA: Diagnosis not present

## 2021-05-06 DIAGNOSIS — C7989 Secondary malignant neoplasm of other specified sites: Secondary | ICD-10-CM | POA: Diagnosis not present

## 2021-05-06 DIAGNOSIS — Z430 Encounter for attention to tracheostomy: Secondary | ICD-10-CM | POA: Diagnosis not present

## 2021-05-08 DIAGNOSIS — E119 Type 2 diabetes mellitus without complications: Secondary | ICD-10-CM

## 2021-05-08 DIAGNOSIS — I10 Essential (primary) hypertension: Secondary | ICD-10-CM | POA: Diagnosis not present

## 2021-05-08 NOTE — Patient Instructions (Signed)
Visit Information It was great speaking with you today!  Please let me know if you have any questions about our visit.   Goals Addressed             This Visit's Progress    Track and Manage My Blood Pressure-Hypertension       Timeframe:  Long-Range Goal Priority:  High Start Date: 05/08/21                             Expected End Date: 05/08/22                      Follow Up within 90 days   - check blood pressure weekly - write blood pressure results in a log or diary    Why is this important?   You won't feel high blood pressure, but it can still hurt your blood vessels.  High blood pressure can cause heart or kidney problems. It can also cause a stroke.  Making lifestyle changes like losing a little weight or eating less salt will help.  Checking your blood pressure at home and at different times of the day can help to control blood pressure.  If the doctor prescribes medicine remember to take it the way the doctor ordered.  Call the office if you cannot afford the medicine or if there are questions about it.     Notes:         Patient Care Plan: General Pharmacy (Adult)     Problem Identified: Hypertension, Hyperlipidemia, Diabetes, Hypothyroidism, Anxiety, and metastatic squamous cell carcinoma   Priority: High     Long-Range Goal: Patient-Specific Goal   Start Date: 05/08/2021  Expected End Date: 05/08/2022  This Visit's Progress: On track  Priority: High  Note:   Current Barriers:  Unable to self administer medications as prescribed  Pharmacist Clinical Goal(s):  Patient will achieve ability to self administer medications as prescribed through use of pillbox as evidenced by patient report through collaboration with PharmD and provider.   Interventions: 1:1 collaboration with Delsa Grana, PA-C regarding development and update of comprehensive plan of care as evidenced by provider attestation and co-signature Inter-disciplinary care team collaboration (see  longitudinal plan of care) Comprehensive medication review performed; medication list updated in electronic medical record  Hypertension (BP goal <140/90) -Controlled -Current treatment: None -Medications previously tried: Amlodipine, Irbesartan, HCTZ,  -Current home readings: 138/79, 131/79,  -Current dietary habits: [employer] (high sodium)  -Denies hypotensive/hypertensive symptoms -Recommended to continue current medication  Hyperlipidemia: (LDL goal < 100) -Controlled -Current treatment: Rosuvastatin 5 mg nightly  -Medications previously tried: NA -CT Scan Aug 2022 showed coronary plaque -Recommended to continue current medication  Diabetes (A1c goal <7%) -Controlled -Current medications: None -Medications previously tried: Metformin (dysphagia)   -Denies hypoglycemic/hyperglycemic symptoms -Recommended to continue current medication  Depression/Anxiety (Goal: maintain symptom remission) -Controlled -Current treatment: Lorazepam 0.5 mg every 8 hours as needed  Sertraline 25 mg daily  -Medications previously tried/failed: NA -PHQ9: 0 -GAD7: 0 -Recommended to continue current medication  Hypothyroidism (Goal: Maintain normal thyroid function) -Controlled -Current treatment  Levothyroxine 75 mcg daily  -Medications previously tried: NA  -Gives one hour after finishing enteral feeding. -Recommended to continue current medication  Neuropathy (Goal: Minimize pain symptoms) -Not ideally controlled -Managed by Oncology -Current treatment  Gabapentin 100 mg three times daily  -Medications previously tried: NA -Patient previously well controlled on 300 mg gabapentin, decreased during  hospitalization due to concerns of affecting alertness. Caregiver struggles with three times daily dosing of gabapentin, and patient feels lower dose not as effective for helping with numbness.   -Recommend caregiver reach out to oncology to resume gabapentin 300 mg 2-3 times daily. If  once daily dosing is preferred, could switch to Lyrica instead.    Patient Goals/Self-Care Activities Patient will:  - check blood pressure twice weekly, document, and provide at future appointments  Follow Up Plan: Telephone follow up appointment with care management team member scheduled for:  10/14/2021 at 3:00 PM      Patient agreed to services and verbal consent obtained.   Patient verbalizes understanding of instructions provided today and agrees to view in [place].   Malva Limes, [facility] [phone]

## 2021-05-11 DIAGNOSIS — C7989 Secondary malignant neoplasm of other specified sites: Secondary | ICD-10-CM | POA: Diagnosis not present

## 2021-05-11 DIAGNOSIS — Z430 Encounter for attention to tracheostomy: Secondary | ICD-10-CM | POA: Diagnosis not present

## 2021-05-13 DIAGNOSIS — R682 Dry mouth, unspecified: Secondary | ICD-10-CM | POA: Diagnosis not present

## 2021-05-13 DIAGNOSIS — Z9002 Acquired absence of larynx: Secondary | ICD-10-CM | POA: Diagnosis not present

## 2021-05-13 DIAGNOSIS — J392 Other diseases of pharynx: Secondary | ICD-10-CM | POA: Diagnosis not present

## 2021-05-18 ENCOUNTER — Telehealth (INDEPENDENT_AMBULATORY_CARE_PROVIDER_SITE_OTHER): Payer: Medicare HMO | Admitting: Family Medicine

## 2021-05-18 ENCOUNTER — Encounter: Payer: Self-pay | Admitting: Family Medicine

## 2021-05-18 ENCOUNTER — Telehealth: Payer: Self-pay | Admitting: Family Medicine

## 2021-05-18 VITALS — HR 101

## 2021-05-18 DIAGNOSIS — C7989 Secondary malignant neoplasm of other specified sites: Secondary | ICD-10-CM | POA: Diagnosis not present

## 2021-05-18 DIAGNOSIS — R052 Subacute cough: Secondary | ICD-10-CM

## 2021-05-18 DIAGNOSIS — J439 Emphysema, unspecified: Secondary | ICD-10-CM

## 2021-05-18 DIAGNOSIS — Z930 Tracheostomy status: Secondary | ICD-10-CM

## 2021-05-18 MED ORDER — IPRATROPIUM-ALBUTEROL 0.5-2.5 (3) MG/3ML IN SOLN: 3.0000 mL | Freq: Four times a day (QID) | RESPIRATORY_TRACT | 0 refills | Status: AC | PRN

## 2021-05-18 MED ORDER — PREDNISONE 20 MG PO TABS: 40.0000 mg | ORAL_TABLET | Freq: Every day | ORAL | 0 refills | Status: AC

## 2021-05-18 MED ORDER — PROMETHAZINE-DM 6.25-15 MG/5ML PO SYRP: 2.5000 mL | ORAL_SOLUTION | Freq: Three times a day (TID) | ORAL | 0 refills | Status: DC | PRN

## 2021-05-18 NOTE — Telephone Encounter (Signed)
April patient care giver states patient is experiencing cough and unable to cough up phlegm and patient has to be suction, mucus is yellow. patient was taking Amocillin. Requesting chest x ray order or PCP recommendation  and clinical advice

## 2021-05-18 NOTE — Progress Notes (Signed)
Name: Connor Aguilar   MRN: [id number]    DOB: 1941-04-04   Date:05/18/2021       Progress Note  Subjective:    Chief Complaint  Chief Complaint  Patient presents with   Cough    Unable to cough phlegm out but mucus color is yellow, patient has to be suction, was taking Amocillin. Requesting chest x ray order.    I connected with  [name] on 05/18/21 at  2:20 PM EDT by telephone and verified that I am speaking with the correct person using two identifiers.   I discussed the limitations, risks, security and privacy concerns of performing an evaluation and management service by telephone and the availability of in person appointments. Staff also discussed with the patient that there may be a patient responsible charge related to this service.  Patient verbalized understanding and agreed to proceed with encounter. Patient Location: home Provider Location: [facility] Additional Individuals present: yes, April  Cough  All hx provided by April caregiver per telephone call Pt had increased sputum and congestion through tracheostomy x 2-3 weeks [place] specialists at [facility] prescribed augmentin x 10 d, sx improved a some but then over the past week sputum increased and worsened again with increased coughing They started doing breathing treatments (saline?) and suction care No vomiting/choking Pt denies any dyspnea, CP He is wheezy, cough worse when laying down They deny any nasal sx/discharge Suctioning pt - with thicker mucus and sometimes blood  No weight loss, change in mood, energy, appetite SpO2 91-93, usually is >95%  96 packyear hx    Patient Active Problem List   Diagnosis Date Noted   Hypothyroidism 02/05/2021   Diarrhea 12/22/2020   G tube feedings ([place]) 12/22/2020   Generalized weakness 12/22/2020   Hyponatremia 12/22/2020   S/P laryngectomy 12/22/2020   Moderate protein-calorie malnutrition ([place]) 11/26/2020   Hypokalemia 10/09/2020   Metastatic squamous  cell carcinoma to head and neck ([place]) 08/22/2020   Goals of care, counseling/discussion 08/19/2020   Laryngeal cancer ([place]) 08/19/2020   Chronic arthropathy 10/25/2019   Onychomycosis of multiple toenails with type 2 diabetes mellitus ([place]) 05/16/2018   Erectile dysfunction 08/19/2015   Type 2 diabetes mellitus without complication ([place]) 20/25/4270   Tobacco abuse 04/09/2015   Hyperlipidemia 04/02/2015   Hypertension 04/02/2015   Panlobular emphysema ([place]) 04/02/2015    Social History   Tobacco Use   Smoking status: Former    Packs/day: 1.50    Years: 64.00    Pack years: 96.00    Types: Cigarettes    Start date: 08/09/1956    Quit date: 08/09/2020    Years since quitting: 0.7   Smokeless tobacco: Never  Substance Use Topics   Alcohol use: No    Alcohol/week: 0.0 standard drinks     Current Outpatient Medications:    acetaminophen (TYLENOL) 325 MG tablet, Take 2 tablets (650 mg total) by mouth every 6 (six) hours as needed for mild pain (or Fever >/= 101)., Disp: , Rfl:    blood glucose meter kit and supplies, Accuchek or whatever is covered by insurance; check sugars once a day only if desired; Dx E11.65, LON 99 months, Disp: 1 each, Rfl: 0   folic acid (FOLVITE) 1 MG tablet, Give 1 tablet (1 mg total) by G-tube route in the morning., Disp: , Rfl:    gabapentin (NEURONTIN) 100 MG capsule, Take 100 mg by mouth 3 (three) times daily., Disp: , Rfl:    glucose blood test strip, Use  as instructed to check sugars once a day if desired; LON 99 months, Dx E11.65, Disp: 100 each, Rfl: 3   levothyroxine (SYNTHROID) 75 MCG tablet, Take 1 tablet (75 mcg total) by mouth daily., Disp: 90 tablet, Rfl: 1   LORazepam (ATIVAN) 0.5 MG tablet, Take 1 tablet (0.5 mg total) by mouth every 8 (eight) hours as needed for anxiety (N/V)., Disp: 30 tablet, Rfl: 1   magnesium oxide (MAG-OX) 400 MG tablet, Place 1 tablet (400 mg total) into feeding tube daily., Disp: 90 tablet, Rfl: 1   Oxycodone HCl 10 MG  TABS, Take 10 mg by mouth every 4 (four) hours as needed., Disp: , Rfl:    polyethylene glycol powder (GLYCOLAX/MIRALAX) 17 GM/SCOOP powder, Take 17 g by mouth daily as needed., Disp: , Rfl:    rosuvastatin (CRESTOR) 5 MG tablet, TAKE ONE TABLET EVERY DAY AT BEDTIME, Disp: 90 tablet, Rfl: 3   sertraline (ZOLOFT) 25 MG tablet, Take 25 mg by mouth daily., Disp: , Rfl:   No Known Allergies  Chart Review: I personally reviewed active problem list, medication list, allergies, family history, social history, health maintenance, notes from last encounter, lab results, imaging with the patient/caregiver today.   Review of Systems  Constitutional: Negative.   HENT: Negative.    Eyes: Negative.   Respiratory:  Positive for cough.   Cardiovascular: Negative.   Gastrointestinal: Negative.   Endocrine: Negative.   Genitourinary: Negative.   Musculoskeletal: Negative.   Skin: Negative.   Allergic/Immunologic: Negative.   Neurological: Negative.   Hematological: Negative.   Psychiatric/Behavioral: Negative.    All other systems reviewed and are negative.   Objective:    Virtual encounter, vitals limited, only able to obtain the following Today's Vitals   05/18/21 1334  Pulse: (!) 101  SpO2: 91%   There is no height or weight on file to calculate BMI. Nursing Note and Vital Signs reviewed.  Physical Exam Vitals and nursing note reviewed.  Pulmonary:     Breath sounds: Wheezing present.  Neurological:     Mental Status: He is alert.   Pt alert, answering questions when asked by April, audible coughing and wheeze PE limited by telephone encounter  No results found for this or any previous visit (from the past 72 hour(s)).  Assessment and Plan:     ICD-10-CM   1. Subacute cough  R05.2 DG Chest 2 View   x 2.5 weeks, improved a little with augmentin, worsened once finished with abx, hx of emphysema/copd, aspiration pneumonia, pleural effusion, metastatin cancer    2. Acute  exacerbation of emphysema (HCC)  J43.9 promethazine-dextromethorphan (PROMETHAZINE-DM) 6.25-15 MG/5ML syrup    DG Chest 2 View   audible wheezy cough over telephone encounter - start nebulizer tx, steroids, +/- another round of abx, CXR pending    3. Metastatic squamous cell carcinoma to head and neck (HCC)  C79.89 DG Chest 2 View   worse cough/mucus x 2-3 weeks, pt clinically well appearing, alert, normal appetite, energy, no CP, SOB, fever, sweats - low threshold for abx/UC/ED     4. Tracheostomy in place [facility])  Z93.0 DG Chest 2 View   trouble with clearing secretions, worse with laying down, prior SLP and ENT have trained pt and April on pulm toilet, encourage sitting up, clearing cough     If no improvement with tx of possible exacerbation then April and pt were instructed to get f/up with ENT/SLP to recheck tacheostomy/suction etc. CXR - will do to see if there  is need for additional abx?   No obvious aspiration/choking, Sp02 a little lower than his reported baseline - advised going to ED if <90% or any worsening clinical picture.  -Red flags and when to present for emergency care or RTC including but not limited to new/worsening/un-resolving symptoms,  reviewed with patient at time of visit. Follow up and care instructions discussed and provided in AVS. - I discussed the assessment and treatment plan with the patient. The patient was provided an opportunity to ask questions and all were answered. The patient agreed with the plan and demonstrated an understanding of the instructions.  - The patient was advised to call back or seek an in-person evaluation if the symptoms worsen or if the condition fails to improve as anticipated.  I provided 25+ minutes of non-face-to-face time during this encounter.  Delsa Grana, PA-C 05/18/21 2:48 PM

## 2021-05-18 NOTE — Telephone Encounter (Signed)
Pt is on for a virtual appt for this afternoon with [facility]

## 2021-05-19 ENCOUNTER — Ambulatory Visit
Admission: RE | Admit: 2021-05-19 | Discharge: 2021-05-19 | Disposition: A | Discharge status: Home / Self Care | Payer: Medicare HMO | Attending: Family Medicine | Admitting: Family Medicine

## 2021-05-19 ENCOUNTER — Other Ambulatory Visit: Payer: Self-pay

## 2021-05-19 ENCOUNTER — Ambulatory Visit
Admission: RE | Admit: 2021-05-19 | Discharge: 2021-05-19 | Disposition: A | Discharge status: Home / Self Care | Payer: Medicare HMO | Source: Ambulatory Visit | Attending: Family Medicine | Admitting: Family Medicine

## 2021-05-19 DIAGNOSIS — R052 Subacute cough: Secondary | ICD-10-CM | POA: Insufficient documentation

## 2021-05-19 DIAGNOSIS — R059 Cough, unspecified: Secondary | ICD-10-CM | POA: Diagnosis not present

## 2021-05-19 DIAGNOSIS — R0902 Hypoxemia: Secondary | ICD-10-CM | POA: Diagnosis not present

## 2021-05-19 DIAGNOSIS — J189 Pneumonia, unspecified organism: Secondary | ICD-10-CM | POA: Diagnosis not present

## 2021-05-19 DIAGNOSIS — Z930 Tracheostomy status: Secondary | ICD-10-CM | POA: Diagnosis not present

## 2021-05-19 DIAGNOSIS — Z430 Encounter for attention to tracheostomy: Secondary | ICD-10-CM | POA: Diagnosis not present

## 2021-05-19 DIAGNOSIS — J439 Emphysema, unspecified: Secondary | ICD-10-CM

## 2021-05-19 DIAGNOSIS — C7989 Secondary malignant neoplasm of other specified sites: Secondary | ICD-10-CM | POA: Insufficient documentation

## 2021-05-19 DIAGNOSIS — J690 Pneumonitis due to inhalation of food and vomit: Secondary | ICD-10-CM | POA: Diagnosis not present

## 2021-05-20 ENCOUNTER — Other Ambulatory Visit: Payer: Self-pay | Admitting: Family Medicine

## 2021-05-20 MED ORDER — LEVOFLOXACIN 500 MG PO TABS: 500.0000 mg | ORAL_TABLET | Freq: Every day | ORAL | 0 refills | Status: AC

## 2021-05-25 DIAGNOSIS — I10 Essential (primary) hypertension: Secondary | ICD-10-CM | POA: Diagnosis not present

## 2021-05-25 DIAGNOSIS — Z87891 Personal history of nicotine dependence: Secondary | ICD-10-CM | POA: Diagnosis not present

## 2021-05-25 DIAGNOSIS — E119 Type 2 diabetes mellitus without complications: Secondary | ICD-10-CM | POA: Diagnosis not present

## 2021-05-25 DIAGNOSIS — Z448 Encounter for fitting and adjustment of other external prosthetic devices: Secondary | ICD-10-CM | POA: Diagnosis not present

## 2021-05-25 DIAGNOSIS — Z9002 Acquired absence of larynx: Secondary | ICD-10-CM | POA: Diagnosis not present

## 2021-05-28 DIAGNOSIS — R0902 Hypoxemia: Secondary | ICD-10-CM | POA: Diagnosis not present

## 2021-05-28 DIAGNOSIS — J690 Pneumonitis due to inhalation of food and vomit: Secondary | ICD-10-CM | POA: Diagnosis not present

## 2021-05-28 DIAGNOSIS — Z430 Encounter for attention to tracheostomy: Secondary | ICD-10-CM | POA: Diagnosis not present

## 2021-05-28 DIAGNOSIS — C7989 Secondary malignant neoplasm of other specified sites: Secondary | ICD-10-CM | POA: Diagnosis not present

## 2021-05-29 DIAGNOSIS — C7989 Secondary malignant neoplasm of other specified sites: Secondary | ICD-10-CM | POA: Diagnosis not present

## 2021-05-29 DIAGNOSIS — J690 Pneumonitis due to inhalation of food and vomit: Secondary | ICD-10-CM | POA: Diagnosis not present

## 2021-05-29 DIAGNOSIS — R0902 Hypoxemia: Secondary | ICD-10-CM | POA: Diagnosis not present

## 2021-05-29 DIAGNOSIS — Z79890 Hormone replacement therapy (postmenopausal): Secondary | ICD-10-CM | POA: Diagnosis not present

## 2021-05-29 DIAGNOSIS — Z430 Encounter for attention to tracheostomy: Secondary | ICD-10-CM | POA: Diagnosis not present

## 2021-05-29 DIAGNOSIS — Z87891 Personal history of nicotine dependence: Secondary | ICD-10-CM | POA: Diagnosis not present

## 2021-05-29 DIAGNOSIS — R1310 Dysphagia, unspecified: Secondary | ICD-10-CM | POA: Diagnosis not present

## 2021-05-29 DIAGNOSIS — Z9002 Acquired absence of larynx: Secondary | ICD-10-CM | POA: Diagnosis not present

## 2021-05-29 DIAGNOSIS — Z8521 Personal history of malignant neoplasm of larynx: Secondary | ICD-10-CM | POA: Diagnosis not present

## 2021-05-29 DIAGNOSIS — E119 Type 2 diabetes mellitus without complications: Secondary | ICD-10-CM | POA: Diagnosis not present

## 2021-05-29 DIAGNOSIS — I10 Essential (primary) hypertension: Secondary | ICD-10-CM | POA: Diagnosis not present

## 2021-06-01 DIAGNOSIS — C7989 Secondary malignant neoplasm of other specified sites: Secondary | ICD-10-CM | POA: Diagnosis not present

## 2021-06-01 DIAGNOSIS — S22000A Wedge compression fracture of unspecified thoracic vertebra, initial encounter for closed fracture: Secondary | ICD-10-CM | POA: Diagnosis not present

## 2021-06-01 DIAGNOSIS — R21 Rash and other nonspecific skin eruption: Secondary | ICD-10-CM | POA: Diagnosis not present

## 2021-06-01 DIAGNOSIS — E46 Unspecified protein-calorie malnutrition: Secondary | ICD-10-CM | POA: Diagnosis not present

## 2021-06-01 DIAGNOSIS — B029 Zoster without complications: Secondary | ICD-10-CM | POA: Diagnosis not present

## 2021-06-01 DIAGNOSIS — M4854XA Collapsed vertebra, not elsewhere classified, thoracic region, initial encounter for fracture: Secondary | ICD-10-CM | POA: Diagnosis not present

## 2021-06-01 DIAGNOSIS — R059 Cough, unspecified: Secondary | ICD-10-CM | POA: Diagnosis not present

## 2021-06-01 DIAGNOSIS — R918 Other nonspecific abnormal finding of lung field: Secondary | ICD-10-CM | POA: Diagnosis not present

## 2021-06-01 DIAGNOSIS — J189 Pneumonia, unspecified organism: Secondary | ICD-10-CM | POA: Diagnosis not present

## 2021-06-01 DIAGNOSIS — R61 Generalized hyperhidrosis: Secondary | ICD-10-CM | POA: Diagnosis not present

## 2021-06-01 DIAGNOSIS — Z430 Encounter for attention to tracheostomy: Secondary | ICD-10-CM | POA: Diagnosis not present

## 2021-06-01 DIAGNOSIS — E039 Hypothyroidism, unspecified: Secondary | ICD-10-CM | POA: Diagnosis not present

## 2021-06-01 DIAGNOSIS — Z794 Long term (current) use of insulin: Secondary | ICD-10-CM | POA: Diagnosis not present

## 2021-06-01 DIAGNOSIS — R109 Unspecified abdominal pain: Secondary | ICD-10-CM | POA: Diagnosis not present

## 2021-06-01 DIAGNOSIS — J90 Pleural effusion, not elsewhere classified: Secondary | ICD-10-CM | POA: Diagnosis not present

## 2021-06-01 DIAGNOSIS — R5381 Other malaise: Secondary | ICD-10-CM | POA: Diagnosis not present

## 2021-06-01 DIAGNOSIS — R0902 Hypoxemia: Secondary | ICD-10-CM | POA: Diagnosis not present

## 2021-06-01 DIAGNOSIS — R000 Tachycardia, unspecified: Secondary | ICD-10-CM | POA: Diagnosis not present

## 2021-06-01 DIAGNOSIS — J439 Emphysema, unspecified: Secondary | ICD-10-CM | POA: Diagnosis not present

## 2021-06-01 DIAGNOSIS — Z931 Gastrostomy status: Secondary | ICD-10-CM | POA: Diagnosis not present

## 2021-06-01 DIAGNOSIS — E871 Hypo-osmolality and hyponatremia: Secondary | ICD-10-CM | POA: Diagnosis not present

## 2021-06-01 DIAGNOSIS — J690 Pneumonitis due to inhalation of food and vomit: Secondary | ICD-10-CM | POA: Diagnosis not present

## 2021-06-01 DIAGNOSIS — Z681 Body mass index (BMI) 19 or less, adult: Secondary | ICD-10-CM | POA: Diagnosis not present

## 2021-06-01 DIAGNOSIS — R1084 Generalized abdominal pain: Secondary | ICD-10-CM | POA: Diagnosis not present

## 2021-06-01 DIAGNOSIS — E119 Type 2 diabetes mellitus without complications: Secondary | ICD-10-CM | POA: Diagnosis not present

## 2021-06-01 DIAGNOSIS — R4182 Altered mental status, unspecified: Secondary | ICD-10-CM | POA: Diagnosis not present

## 2021-06-01 DIAGNOSIS — E278 Other specified disorders of adrenal gland: Secondary | ICD-10-CM | POA: Diagnosis not present

## 2021-06-01 DIAGNOSIS — Z930 Tracheostomy status: Secondary | ICD-10-CM | POA: Diagnosis not present

## 2021-06-01 DIAGNOSIS — L539 Erythematous condition, unspecified: Secondary | ICD-10-CM | POA: Diagnosis not present

## 2021-06-02 DIAGNOSIS — Z430 Encounter for attention to tracheostomy: Secondary | ICD-10-CM | POA: Diagnosis not present

## 2021-06-07 DIAGNOSIS — M6281 Muscle weakness (generalized): Secondary | ICD-10-CM | POA: Diagnosis not present

## 2021-06-07 DIAGNOSIS — R918 Other nonspecific abnormal finding of lung field: Secondary | ICD-10-CM | POA: Diagnosis not present

## 2021-06-07 DIAGNOSIS — R400 Somnolence: Secondary | ICD-10-CM | POA: Diagnosis not present

## 2021-06-07 DIAGNOSIS — B370 Candidal stomatitis: Secondary | ICD-10-CM | POA: Diagnosis not present

## 2021-06-07 DIAGNOSIS — C760 Malignant neoplasm of head, face and neck: Secondary | ICD-10-CM | POA: Diagnosis not present

## 2021-06-07 DIAGNOSIS — R000 Tachycardia, unspecified: Secondary | ICD-10-CM | POA: Diagnosis not present

## 2021-06-07 DIAGNOSIS — B027 Disseminated zoster: Secondary | ICD-10-CM | POA: Diagnosis not present

## 2021-06-07 DIAGNOSIS — Z930 Tracheostomy status: Secondary | ICD-10-CM | POA: Diagnosis not present

## 2021-06-07 DIAGNOSIS — Z743 Need for continuous supervision: Secondary | ICD-10-CM | POA: Diagnosis not present

## 2021-06-07 DIAGNOSIS — Z20822 Contact with and (suspected) exposure to covid-19: Secondary | ICD-10-CM | POA: Diagnosis not present

## 2021-06-07 DIAGNOSIS — R531 Weakness: Secondary | ICD-10-CM | POA: Diagnosis not present

## 2021-06-07 DIAGNOSIS — R0902 Hypoxemia: Secondary | ICD-10-CM | POA: Diagnosis not present

## 2021-06-07 DIAGNOSIS — E039 Hypothyroidism, unspecified: Secondary | ICD-10-CM | POA: Diagnosis not present

## 2021-06-07 DIAGNOSIS — C329 Malignant neoplasm of larynx, unspecified: Secondary | ICD-10-CM | POA: Diagnosis not present

## 2021-06-07 DIAGNOSIS — B029 Zoster without complications: Secondary | ICD-10-CM | POA: Diagnosis not present

## 2021-06-07 DIAGNOSIS — R059 Cough, unspecified: Secondary | ICD-10-CM | POA: Diagnosis not present

## 2021-06-07 DIAGNOSIS — J90 Pleural effusion, not elsewhere classified: Secondary | ICD-10-CM | POA: Diagnosis not present

## 2021-06-07 DIAGNOSIS — J9601 Acute respiratory failure with hypoxia: Secondary | ICD-10-CM | POA: Diagnosis not present

## 2021-06-07 DIAGNOSIS — Z681 Body mass index (BMI) 19 or less, adult: Secondary | ICD-10-CM | POA: Diagnosis not present

## 2021-06-07 DIAGNOSIS — E43 Unspecified severe protein-calorie malnutrition: Secondary | ICD-10-CM | POA: Diagnosis not present

## 2021-06-07 DIAGNOSIS — J9611 Chronic respiratory failure with hypoxia: Secondary | ICD-10-CM | POA: Diagnosis not present

## 2021-06-07 DIAGNOSIS — E1165 Type 2 diabetes mellitus with hyperglycemia: Secondary | ICD-10-CM | POA: Diagnosis not present

## 2021-06-07 DIAGNOSIS — R739 Hyperglycemia, unspecified: Secondary | ICD-10-CM | POA: Diagnosis not present

## 2021-06-07 DIAGNOSIS — J189 Pneumonia, unspecified organism: Secondary | ICD-10-CM | POA: Diagnosis not present

## 2021-06-07 DIAGNOSIS — R4182 Altered mental status, unspecified: Secondary | ICD-10-CM | POA: Diagnosis not present

## 2021-06-07 DIAGNOSIS — R252 Cramp and spasm: Secondary | ICD-10-CM | POA: Diagnosis not present

## 2021-06-07 DIAGNOSIS — E871 Hypo-osmolality and hyponatremia: Secondary | ICD-10-CM | POA: Diagnosis not present

## 2021-06-07 DIAGNOSIS — C4442 Squamous cell carcinoma of skin of scalp and neck: Secondary | ICD-10-CM | POA: Diagnosis not present

## 2021-06-17 DIAGNOSIS — Z87891 Personal history of nicotine dependence: Secondary | ICD-10-CM | POA: Diagnosis not present

## 2021-06-17 DIAGNOSIS — R0902 Hypoxemia: Secondary | ICD-10-CM | POA: Diagnosis not present

## 2021-06-17 DIAGNOSIS — I10 Essential (primary) hypertension: Secondary | ICD-10-CM | POA: Diagnosis not present

## 2021-06-17 DIAGNOSIS — R1310 Dysphagia, unspecified: Secondary | ICD-10-CM | POA: Diagnosis not present

## 2021-06-17 DIAGNOSIS — B370 Candidal stomatitis: Secondary | ICD-10-CM | POA: Diagnosis not present

## 2021-06-17 DIAGNOSIS — Z431 Encounter for attention to gastrostomy: Secondary | ICD-10-CM | POA: Diagnosis not present

## 2021-06-17 DIAGNOSIS — R531 Weakness: Secondary | ICD-10-CM | POA: Diagnosis not present

## 2021-06-17 DIAGNOSIS — J9611 Chronic respiratory failure with hypoxia: Secondary | ICD-10-CM | POA: Diagnosis not present

## 2021-06-17 DIAGNOSIS — Z4682 Encounter for fitting and adjustment of non-vascular catheter: Secondary | ICD-10-CM | POA: Diagnosis not present

## 2021-06-17 DIAGNOSIS — J189 Pneumonia, unspecified organism: Secondary | ICD-10-CM | POA: Diagnosis not present

## 2021-06-17 DIAGNOSIS — Z931 Gastrostomy status: Secondary | ICD-10-CM | POA: Diagnosis not present

## 2021-06-17 DIAGNOSIS — E1165 Type 2 diabetes mellitus with hyperglycemia: Secondary | ICD-10-CM | POA: Diagnosis not present

## 2021-06-17 DIAGNOSIS — J159 Unspecified bacterial pneumonia: Secondary | ICD-10-CM | POA: Diagnosis not present

## 2021-06-17 DIAGNOSIS — J9610 Chronic respiratory failure, unspecified whether with hypoxia or hypercapnia: Secondary | ICD-10-CM | POA: Diagnosis not present

## 2021-06-17 DIAGNOSIS — Z8529 Personal history of malignant neoplasm of other respiratory and intrathoracic organs: Secondary | ICD-10-CM | POA: Diagnosis not present

## 2021-06-17 DIAGNOSIS — Z79899 Other long term (current) drug therapy: Secondary | ICD-10-CM | POA: Diagnosis not present

## 2021-06-17 DIAGNOSIS — R5381 Other malaise: Secondary | ICD-10-CM | POA: Diagnosis not present

## 2021-06-17 DIAGNOSIS — K9423 Gastrostomy malfunction: Secondary | ICD-10-CM | POA: Diagnosis not present

## 2021-06-17 DIAGNOSIS — R918 Other nonspecific abnormal finding of lung field: Secondary | ICD-10-CM | POA: Diagnosis not present

## 2021-06-17 DIAGNOSIS — C329 Malignant neoplasm of larynx, unspecified: Secondary | ICD-10-CM | POA: Diagnosis not present

## 2021-06-17 DIAGNOSIS — E119 Type 2 diabetes mellitus without complications: Secondary | ICD-10-CM | POA: Diagnosis not present

## 2021-06-17 DIAGNOSIS — C4442 Squamous cell carcinoma of skin of scalp and neck: Secondary | ICD-10-CM | POA: Diagnosis not present

## 2021-06-17 DIAGNOSIS — Z7401 Bed confinement status: Secondary | ICD-10-CM | POA: Diagnosis not present

## 2021-06-17 DIAGNOSIS — J90 Pleural effusion, not elsewhere classified: Secondary | ICD-10-CM | POA: Diagnosis not present

## 2021-06-17 DIAGNOSIS — R400 Somnolence: Secondary | ICD-10-CM | POA: Diagnosis not present

## 2021-06-17 DIAGNOSIS — E039 Hypothyroidism, unspecified: Secondary | ICD-10-CM | POA: Diagnosis not present

## 2021-06-17 DIAGNOSIS — Z930 Tracheostomy status: Secondary | ICD-10-CM | POA: Diagnosis not present

## 2021-06-17 DIAGNOSIS — J9601 Acute respiratory failure with hypoxia: Secondary | ICD-10-CM | POA: Diagnosis not present

## 2021-06-17 DIAGNOSIS — E43 Unspecified severe protein-calorie malnutrition: Secondary | ICD-10-CM | POA: Diagnosis not present

## 2021-06-17 DIAGNOSIS — N390 Urinary tract infection, site not specified: Secondary | ICD-10-CM | POA: Diagnosis not present

## 2021-06-17 DIAGNOSIS — E871 Hypo-osmolality and hyponatremia: Secondary | ICD-10-CM | POA: Diagnosis not present

## 2021-06-17 DIAGNOSIS — R739 Hyperglycemia, unspecified: Secondary | ICD-10-CM | POA: Diagnosis not present

## 2021-06-17 DIAGNOSIS — M6281 Muscle weakness (generalized): Secondary | ICD-10-CM | POA: Diagnosis not present

## 2021-06-23 DIAGNOSIS — C329 Malignant neoplasm of larynx, unspecified: Secondary | ICD-10-CM | POA: Diagnosis not present

## 2021-06-23 DIAGNOSIS — J9610 Chronic respiratory failure, unspecified whether with hypoxia or hypercapnia: Secondary | ICD-10-CM | POA: Diagnosis not present

## 2021-06-23 DIAGNOSIS — Z931 Gastrostomy status: Secondary | ICD-10-CM | POA: Diagnosis not present

## 2021-06-23 DIAGNOSIS — Z930 Tracheostomy status: Secondary | ICD-10-CM | POA: Diagnosis not present

## 2021-06-23 DIAGNOSIS — R1310 Dysphagia, unspecified: Secondary | ICD-10-CM | POA: Diagnosis not present

## 2021-06-24 ENCOUNTER — Emergency Department
Admission: EM | Admit: 2021-06-24 | Discharge: 2021-06-24 | Disposition: A | Discharge status: Home / Self Care | Payer: Medicare HMO | Attending: Emergency Medicine | Admitting: Emergency Medicine

## 2021-06-24 ENCOUNTER — Emergency Department: Payer: Medicare HMO

## 2021-06-24 ENCOUNTER — Other Ambulatory Visit: Payer: Self-pay

## 2021-06-24 DIAGNOSIS — Z87891 Personal history of nicotine dependence: Secondary | ICD-10-CM | POA: Insufficient documentation

## 2021-06-24 DIAGNOSIS — Z8529 Personal history of malignant neoplasm of other respiratory and intrathoracic organs: Secondary | ICD-10-CM | POA: Insufficient documentation

## 2021-06-24 DIAGNOSIS — Z4682 Encounter for fitting and adjustment of non-vascular catheter: Secondary | ICD-10-CM | POA: Diagnosis not present

## 2021-06-24 DIAGNOSIS — K9423 Gastrostomy malfunction: Secondary | ICD-10-CM

## 2021-06-24 DIAGNOSIS — I10 Essential (primary) hypertension: Secondary | ICD-10-CM | POA: Insufficient documentation

## 2021-06-24 DIAGNOSIS — E039 Hypothyroidism, unspecified: Secondary | ICD-10-CM | POA: Diagnosis not present

## 2021-06-24 DIAGNOSIS — Z431 Encounter for attention to gastrostomy: Secondary | ICD-10-CM | POA: Insufficient documentation

## 2021-06-24 DIAGNOSIS — E119 Type 2 diabetes mellitus without complications: Secondary | ICD-10-CM | POA: Diagnosis not present

## 2021-06-24 DIAGNOSIS — Z79899 Other long term (current) drug therapy: Secondary | ICD-10-CM | POA: Insufficient documentation

## 2021-06-24 LAB — CBG MONITORING, ED: Glucose-Capillary: 214 mg/dL — ABNORMAL HIGH (ref 70–99)

## 2021-06-24 MED ORDER — DIATRIZOATE MEGLUMINE & SODIUM 66-10 % PO SOLN
30.0000 mL | Freq: Once | ORAL | Status: AC
  Administered 2021-06-24: 30 mL

## 2021-06-24 NOTE — ED Notes (Signed)
Per c-com, next on the list to be transported back to nursing home.

## 2021-06-24 NOTE — ED Triage Notes (Signed)
Pt to ED [facility] from [facility] for g tube replacement.  Pt has trach, on 6L on arrival Pt alert, shaking head yes and no to answer questions

## 2021-06-24 NOTE — ED Notes (Signed)
Report given to Jody, LPN at [facility]

## 2021-06-24 NOTE — ED Notes (Signed)
[facility]  CALLED  FOR  TRANSPORT  TO  [facility] 

## 2021-06-24 NOTE — ED Provider Notes (Signed)
[facility] Emergency Department Provider Note   ____________________________________________   Event Date/Time   First MD Initiated Contact with Patient 06/24/21 1320     (approximate)  I have reviewed the triage vital signs and the nursing notes.   HISTORY  Chief Complaint No chief complaint on file.    HPI Connor Aguilar is a 80 y.o. male who presents via EMS from a long-term care facility with a history of of tracheostomy and G-tube dependence with reports that patient's G-tube was not functioning correctly and they wanted to determine placement.  Patient was fed through this G-tube last night.  Further history and review of systems are unable to be obtained at this time given patient's tracheostomy.          Past Medical History:  Diagnosis Date   Allergy    Diabetes mellitus without complication ([place])    Elevated CPK 11/18/2015   Hyperlipidemia    Hypertension    Tobacco abuse     Patient Active Problem List   Diagnosis Date Noted   Hypothyroidism 01/23/2021   Mood disorder ([place]) 01/15/2021   Diarrhea 12/22/2020   G tube feedings ([place]) 12/22/2020   Generalized weakness 12/22/2020   Hyponatremia 12/22/2020   S/P laryngectomy 12/22/2020   Moderate protein-calorie malnutrition ([place]) 11/26/2020   Hypokalemia 10/09/2020   Metastatic squamous cell carcinoma to head and neck ([place]) 08/22/2020   Goals of care, counseling/discussion 08/19/2020   Laryngeal cancer ([place]) 08/19/2020   Chronic arthropathy 10/25/2019   Onychomycosis of multiple toenails with type 2 diabetes mellitus ([place]) 05/16/2018   Erectile dysfunction 08/19/2015   Type 2 diabetes mellitus without complication ([place]) 25/49/8264   Aspiration pneumonia ([place]) 07/27/2015   Tobacco abuse 04/09/2015   Hyperlipidemia 04/02/2015   Hypertension 04/02/2015   Panlobular emphysema ([place]) 04/02/2015    Past Surgical History:  Procedure Laterality Date   FOOT SURGERY      Prior to  Admission medications   Medication Sig Start Date End Date Taking? Authorizing Provider  acetaminophen (TYLENOL) 325 MG tablet Take 2 tablets (650 mg total) by mouth every 6 (six) hours as needed for mild pain (or Fever >/= 101). 07/30/15   Nicholes Mango, MD  blood glucose meter kit and supplies Accuchek or whatever is covered by insurance; check sugars once a day only if desired; Dx E11.65, LON 99 months 12/13/17   Lada, Satira Anis, MD  folic acid (FOLVITE) 1 MG tablet Give 1 tablet (1 mg total) by G-tube route in the morning. 01/16/21 01/16/22  [provider]  gabapentin (NEURONTIN) 100 MG capsule Take 100 mg by mouth 3 (three) times daily. 10/28/20 10/28/21  [provider]  glucose blood test strip Use as instructed to check sugars once a day if desired; LON 99 months, Dx E11.65 12/13/17   Arnetha Courser, MD  ipratropium-albuterol (DUONEB) 0.5-2.5 (3) MG/3ML SOLN Take 3 mLs by nebulization every 6 (six) hours as needed (wheeze and cough (can do separate from saline)). 05/18/21   Delsa Grana, PA-C  levothyroxine (SYNTHROID) 75 MCG tablet Take 1 tablet (75 mcg total) by mouth daily. 03/18/21   Delsa Grana, PA-C  LORazepam (ATIVAN) 0.5 MG tablet Take 1 tablet (0.5 mg total) by mouth every 8 (eight) hours as needed for anxiety (N/V). 03/18/21   Delsa Grana, PA-C  magnesium oxide (MAG-OX) 400 MG tablet Place 1 tablet (400 mg total) into feeding tube daily. 03/18/21   Delsa Grana, PA-C  nystatin (MYCOSTATIN) 100000 UNIT/ML suspension  04/14/21  [provider]  Oxycodone HCl 10 MG TABS Take 10 mg by mouth every 4 (four) hours as needed. 11/11/20   [provider]  polyethylene glycol powder (GLYCOLAX/MIRALAX) 17 GM/SCOOP powder Take 17 g by mouth daily as needed.    [provider]  promethazine-dextromethorphan (PROMETHAZINE-DM) 6.25-15 MG/5ML syrup Take 2.5-5 mLs by mouth 3 (three) times daily as needed for cough. 05/18/21   Delsa Grana, PA-C  rosuvastatin (CRESTOR)  5 MG tablet TAKE ONE TABLET EVERY DAY AT BEDTIME 12/01/20   Delsa Grana, PA-C  sertraline (ZOLOFT) 25 MG tablet Take 25 mg by mouth daily. 02/06/21   [provider]    Allergies Patient has no known allergies.  Family History  Problem Relation Age of Onset   Diabetes Mother     Social History Social History   Tobacco Use   Smoking status: Former    Packs/day: 1.50    Years: 64.00    Pack years: 96.00    Types: Cigarettes    Start date: 08/09/1956    Quit date: 08/09/2020    Years since quitting: 0.8   Smokeless tobacco: Never  Vaping Use   Vaping Use: Never used  Substance Use Topics   Alcohol use: No    Alcohol/week: 0.0 standard drinks   Drug use: No    Review of Systems Unable to assess ____________________________________________   PHYSICAL EXAM:  VITAL SIGNS: ED Triage Vitals  Enc Vitals Group     BP 06/24/21 1056 115/83     Pulse Rate 06/24/21 1056 78     Resp 06/24/21 1056 (!) 22     Temp 06/24/21 1056 97.9 F (36.6 C)     Temp Source 06/24/21 1056 Oral     SpO2 06/24/21 1056 94 %     Weight 06/24/21 1057 130 lb (59 kg)     Height --      Head Circumference --      Peak Flow --      Pain Score --      Pain Loc --      Pain Edu? --      Excl. in [place]? --    Constitutional: Alert. Well appearing and in no acute distress. Eyes: Conjunctivae are normal. PERRL. Head: Atraumatic. Nose: No congestion/rhinnorhea. Mouth/Throat: Mucous membranes are moist. Neck: No stridor Cardiovascular: Grossly normal heart sounds.  Good peripheral circulation. Respiratory: Normal respiratory effort.  No retractions. Gastrointestinal: Soft and nontender.  G-tube in place to the left upper quadrant.  No distention. Musculoskeletal: No obvious deformities Neurologic:  Normal speech and language. No gross focal neurologic deficits are appreciated. Skin:  Skin is warm and dry. No rash noted. Psychiatric: Mood and affect are normal. Speech and behavior are  normal.  ____________________________________________   LABS (all labs ordered are listed, but only abnormal results are displayed)  Labs Reviewed  CBG MONITORING, ED - Abnormal; Notable for the following components:      Result Value   Glucose-Capillary 214 (*)    All other components within normal limits   RADIOLOGY  ED MD interpretation: Single view x-ray of the abdomen with Gastrografin infusion shows satisfactory gastrostomy tube placement in the stomach  Official radiology report(s): DG Abdomen 1 View  Result Date: 06/24/2021 CLINICAL DATA:  Gastrostomy tube evaluation EXAM: ABDOMEN - 1 VIEW COMPARISON:  None. FINDINGS: Gastrostomy tube injected with water-soluble contrast. The tube is within the stomach with the tip extending into the fundus. Contrast present in the fundus of stomach as  well as in the gastric antrum and proximal small bowel. No extravasation. No bowel dilatation. IMPRESSION: Satisfactory gastrostomy tube placement in the stomach. Electronically Signed   By: Franchot Gallo M.D.   On: 06/24/2021 14:00    ____________________________________________   PROCEDURES  Procedure(s) performed (including Critical Care):  Procedures   ____________________________________________   INITIAL IMPRESSION / ASSESSMENT AND PLAN / ED COURSE  As part of my medical decision making, I reviewed the following data within the electronic medical record, if available:  Nursing notes reviewed and incorporated, Labs reviewed, EKG interpreted, Old chart reviewed, Radiograph reviewed and Notes from prior ED visits reviewed and incorporated     Patient is a 80 year old male with tracheostomy and G-tube dependence who presents from a long-term care facility via EMS with concerns over G-tube placement.  Patient shows no red flag symptomatology at this time and appears in no acute distress.  X-ray of the abdomen after Gastrografin instillation through the G-tube shows the G-tube to be in  adequate positioning.  Dispo: Discharge to facility      ____________________________________________   FINAL CLINICAL IMPRESSION(S) / ED DIAGNOSES  Final diagnoses:  Malfunction of gastrostomy tube [facility])     ED Discharge Orders     None        Note:  This document was prepared using Dragon voice recognition software and may include unintentional dictation errors.    Naaman Plummer, MD 06/24/21 [phone]

## 2021-06-24 NOTE — ED Notes (Signed)
Attempted to call [facility] for update about patient with no success. G-tube appears well placed with clean dressing in place. Pt discussed with Md Bradler.  Per paper MAR from facility (that was sent with patient) , patient received nighttime feed 11/15.

## 2021-06-24 NOTE — ED Notes (Signed)
LABS sent in case needed

## 2021-06-25 ENCOUNTER — Ambulatory Visit: Payer: Medicare HMO | Admitting: Unknown Physician Specialty

## 2021-06-29 DIAGNOSIS — R5381 Other malaise: Secondary | ICD-10-CM | POA: Diagnosis not present

## 2021-06-29 DIAGNOSIS — E1165 Type 2 diabetes mellitus with hyperglycemia: Secondary | ICD-10-CM | POA: Diagnosis not present

## 2021-06-30 DIAGNOSIS — J9610 Chronic respiratory failure, unspecified whether with hypoxia or hypercapnia: Secondary | ICD-10-CM | POA: Diagnosis not present

## 2021-06-30 DIAGNOSIS — R1310 Dysphagia, unspecified: Secondary | ICD-10-CM | POA: Diagnosis not present

## 2021-06-30 DIAGNOSIS — J159 Unspecified bacterial pneumonia: Secondary | ICD-10-CM | POA: Diagnosis not present

## 2021-06-30 DIAGNOSIS — Z931 Gastrostomy status: Secondary | ICD-10-CM | POA: Diagnosis not present

## 2021-07-04 DIAGNOSIS — E1100 Type 2 diabetes mellitus with hyperosmolarity without nonketotic hyperglycemic-hyperosmolar coma (NKHHC): Secondary | ICD-10-CM | POA: Diagnosis not present

## 2021-07-05 ENCOUNTER — Emergency Department
Admission: EM | Admit: 2021-07-05 | Discharge: 2021-07-05 | Disposition: A | Discharge status: Home / Self Care | Payer: Medicare HMO | Attending: Emergency Medicine | Admitting: Emergency Medicine

## 2021-07-05 ENCOUNTER — Encounter: Payer: Self-pay | Admitting: Emergency Medicine

## 2021-07-05 ENCOUNTER — Emergency Department: Payer: Medicare HMO

## 2021-07-05 ENCOUNTER — Other Ambulatory Visit: Payer: Self-pay

## 2021-07-05 DIAGNOSIS — K9423 Gastrostomy malfunction: Secondary | ICD-10-CM | POA: Insufficient documentation

## 2021-07-05 DIAGNOSIS — Z431 Encounter for attention to gastrostomy: Secondary | ICD-10-CM | POA: Diagnosis not present

## 2021-07-05 DIAGNOSIS — Z87891 Personal history of nicotine dependence: Secondary | ICD-10-CM | POA: Diagnosis not present

## 2021-07-05 DIAGNOSIS — I10 Essential (primary) hypertension: Secondary | ICD-10-CM | POA: Insufficient documentation

## 2021-07-05 DIAGNOSIS — R001 Bradycardia, unspecified: Secondary | ICD-10-CM | POA: Diagnosis not present

## 2021-07-05 DIAGNOSIS — Z7401 Bed confinement status: Secondary | ICD-10-CM | POA: Diagnosis not present

## 2021-07-05 DIAGNOSIS — Z79899 Other long term (current) drug therapy: Secondary | ICD-10-CM | POA: Insufficient documentation

## 2021-07-05 DIAGNOSIS — R000 Tachycardia, unspecified: Secondary | ICD-10-CM | POA: Diagnosis not present

## 2021-07-05 DIAGNOSIS — E119 Type 2 diabetes mellitus without complications: Secondary | ICD-10-CM | POA: Insufficient documentation

## 2021-07-05 DIAGNOSIS — Z4659 Encounter for fitting and adjustment of other gastrointestinal appliance and device: Secondary | ICD-10-CM

## 2021-07-05 DIAGNOSIS — E039 Hypothyroidism, unspecified: Secondary | ICD-10-CM | POA: Diagnosis not present

## 2021-07-05 DIAGNOSIS — R404 Transient alteration of awareness: Secondary | ICD-10-CM | POA: Diagnosis not present

## 2021-07-05 DIAGNOSIS — Z4682 Encounter for fitting and adjustment of non-vascular catheter: Secondary | ICD-10-CM | POA: Diagnosis not present

## 2021-07-05 DIAGNOSIS — R0689 Other abnormalities of breathing: Secondary | ICD-10-CM | POA: Diagnosis not present

## 2021-07-05 MED ORDER — LIDOCAINE HCL URETHRAL/MUCOSAL 2 % EX GEL: 1.0000 "application " | Freq: Once | CUTANEOUS | Status: DC

## 2021-07-05 MED ORDER — LIDOCAINE VISCOUS HCL 2 % MT SOLN: 15.0000 mL | Freq: Once | OROMUCOSAL | Status: DC

## 2021-07-05 MED ORDER — DIATRIZOATE MEGLUMINE & SODIUM 66-10 % PO SOLN
30.0000 mL | Freq: Once | ORAL | Status: AC
  Administered 2021-07-05: 20:00:00 30 mL

## 2021-07-05 NOTE — ED Notes (Signed)
Attempted to Call [facility] for report, no answer x 2.

## 2021-07-05 NOTE — ED Triage Notes (Signed)
Pt via EMS from [facility]. Pt here for dislodgement of G-tube unknown how long it has been out. Pt is at baseline per son. Pt does have trach and on 3L via trach collar.

## 2021-07-05 NOTE — ED Provider Notes (Signed)
[facility] Emergency Department Provider Note   ____________________________________________   None    (approximate)  I have reviewed the triage vital signs and the nursing notes.   HISTORY  Chief Complaint G-Tube Displacement     HPI Connor Aguilar is a 80 y.o. male who was seen by myself and the PA in the lobby of the triage area.  Patient reports his G-tube was dislodged earlier today about 3 hours ago.  At the time he saw him he was not having any further symptoms.  He was breathing normally and did not have any abdominal pain when I saw him.  Please note there is no record in the emergency room notes of a fever.  He did have a pulse rate of 102 at 1 point.  His temperatures have all been 97 9897.9.         Past Medical History:  Diagnosis Date   Allergy    Diabetes mellitus without complication ([place])    Elevated CPK 11/18/2015   Hyperlipidemia    Hypertension    Tobacco abuse     Patient Active Problem List   Diagnosis Date Noted   Hypothyroidism 01/23/2021   Mood disorder ([place]) 01/15/2021   Diarrhea 12/22/2020   G tube feedings ([place]) 12/22/2020   Generalized weakness 12/22/2020   Hyponatremia 12/22/2020   S/P laryngectomy 12/22/2020   Moderate protein-calorie malnutrition ([place]) 11/26/2020   Hypokalemia 10/09/2020   Metastatic squamous cell carcinoma to head and neck ([place]) 08/22/2020   Goals of care, counseling/discussion 08/19/2020   Laryngeal cancer ([place]) 08/19/2020   Chronic arthropathy 10/25/2019   Onychomycosis of multiple toenails with type 2 diabetes mellitus ([place]) 05/16/2018   Erectile dysfunction 08/19/2015   Type 2 diabetes mellitus without complication ([place]) 62/37/6283   Aspiration pneumonia ([place]) 07/27/2015   Tobacco abuse 04/09/2015   Hyperlipidemia 04/02/2015   Hypertension 04/02/2015   Panlobular emphysema ([place]) 04/02/2015    Past Surgical History:  Procedure Laterality Date   FOOT SURGERY      Prior to  Admission medications   Medication Sig Start Date End Date Taking? Authorizing Provider  acetaminophen (TYLENOL) 325 MG tablet Take 2 tablets (650 mg total) by mouth every 6 (six) hours as needed for mild pain (or Fever >/= 101). 07/30/15   Nicholes Mango, MD  blood glucose meter kit and supplies Accuchek or whatever is covered by insurance; check sugars once a day only if desired; Dx E11.65, LON 99 months 12/13/17   Lada, Satira Anis, MD  folic acid (FOLVITE) 1 MG tablet Give 1 tablet (1 mg total) by G-tube route in the morning. 01/16/21 01/16/22  [provider]  gabapentin (NEURONTIN) 100 MG capsule Take 100 mg by mouth 3 (three) times daily. 10/28/20 10/28/21  [provider]  glucose blood test strip Use as instructed to check sugars once a day if desired; LON 99 months, Dx E11.65 12/13/17   Arnetha Courser, MD  ipratropium-albuterol (DUONEB) 0.5-2.5 (3) MG/3ML SOLN Take 3 mLs by nebulization every 6 (six) hours as needed (wheeze and cough (can do separate from saline)). 05/18/21   Delsa Grana, PA-C  levothyroxine (SYNTHROID) 75 MCG tablet Take 1 tablet (75 mcg total) by mouth daily. 03/18/21   Delsa Grana, PA-C  LORazepam (ATIVAN) 0.5 MG tablet Take 1 tablet (0.5 mg total) by mouth every 8 (eight) hours as needed for anxiety (N/V). 03/18/21   Delsa Grana, PA-C  magnesium oxide (MAG-OX) 400 MG tablet Place 1 tablet (400 mg total)  into feeding tube daily. 03/18/21   [name], Leisa, PA-C  nystatin (MYCOSTATIN) 100000 UNIT/ML suspension  04/14/21   [provider]  Oxycodone HCl 10 MG TABS Take 10 mg by mouth every 4 (four) hours as needed. 11/11/20   [provider]  polyethylene glycol powder (GLYCOLAX/MIRALAX) 17 GM/SCOOP powder Take 17 g by mouth daily as needed.    [provider]  promethazine-dextromethorphan (PROMETHAZINE-DM) 6.25-15 MG/5ML syrup Take 2.5-5 mLs by mouth 3 (three) times daily as needed for cough. 05/18/21   [name], Leisa, PA-C  rosuvastatin (CRESTOR)  5 MG tablet TAKE ONE TABLET EVERY DAY AT BEDTIME 12/01/20   [name], Leisa, PA-C  sertraline (ZOLOFT) 25 MG tablet Take 25 mg by mouth daily. 02/06/21   [provider]    Allergies Patient has no known allergies.  Family History  Problem Relation Age of Onset   Diabetes Mother     Social History Social History   Tobacco Use   Smoking status: Former    Packs/day: 1.50    Years: 64.00    Pack years: 96.00    Types: Cigarettes    Start date: 08/09/1956    Quit date: 08/09/2020    Years since quitting: 0.9   Smokeless tobacco: Never  Vaping Use   Vaping Use: Never used  Substance Use Topics   Alcohol use: No    Alcohol/week: 0.0 standard drinks   Drug use: No    Review of Systems  Constitutional: No fever/chills Eyes: No visual changes. ENT: No sore throat. Cardiovascular: Denies chest pain. Respiratory: Denies shortness of breath. Gastrointestinal: Currently no abdominal pain.  No nausea, no vomiting.  No diarrhea.  No constipation. Genitourinary: Negative for dysuria. Musculoskeletal: Negative for back pain. Skin: Negative for rash. Neurological: Negative for headaches, focal weakness   ____________________________________________   PHYSICAL EXAM:  VITAL SIGNS: ED Triage Vitals  Enc Vitals Group     BP 07/05/21 1829 125/79     Pulse Rate 07/05/21 1829 84     Resp 07/05/21 1829 20     Temp 07/05/21 1829 97.6 F (36.4 C)     Temp Source 07/05/21 1829 Oral     SpO2 07/05/21 1829 96 %     Weight 07/05/21 1828 125 lb (56.7 kg)     Height 07/05/21 1828 5' 8" (1.727 m)     Head Circumference --      Peak Flow --      Pain Score --      Pain Loc --      Pain Edu? --      Excl. in GC? --     Constitutional: Alert and oriented. Well appearing and in no acute distress. Eyes: Conjunctivae are normal.  Head: Atraumatic. Nose: No congestion/rhinnorhea. Mouth/Throat: Mucous membranes are moist.  Oropharynx non-erythematous. Neck: No stridor.   Cardiovascular: Normal rate, regular rhythm. Grossly normal heart sounds.  Good peripheral circulation. Respiratory: Normal respiratory effort.  No retractions. Lungs CTAB. Gastrointestinal: Soft and nontender.  He did have some pain while we were putting in the G-tube but this has since resolved.  No distention. No abdominal bruits. No CVA tenderness. Musculoskeletal: No lower extremity tenderness nor edema.  No joint effusions. Neurologic:  Normal speech and language. No gross focal neurologic deficits are appreciated. No gait instability. Skin:  Skin is warm, dry and intact. No rash noted. Psychiatric: Mood and affect are normal. Speech and behavior are normal.  ____________________________________________   LABS (all labs ordered are listed, but only   abnormal results are displayed)  Labs Reviewed - No data to display ____________________________________________  EKG   ____________________________________________  RADIOLOGY I, ,   F, personally viewed and evaluated these images (plain radiographs) as part of my medical decision making, as well as reviewing the written report by the radiologist.  ED MD interpretation: KUB done with contrast after an inserting the G-tube shows a G-tube is in good position I reviewed the films.  Official radiology report(s): DG Abdomen 1 View  Result Date: 07/05/2021 CLINICAL DATA:  Gastrostomy tube check with PO contrast EXAM: ABDOMEN - 1 VIEW COMPARISON:  None. FINDINGS: PO contrast administered via gastrostomy tube located within the left upper abdomen. Partial opacification of the gastric lumen and duodenal lumen. No findings to suggest extravasation of PO contrast. The bowel gas pattern is normal. No radio-opaque calculi or other significant radiographic abnormality are seen. Vascular calcification. IMPRESSION: Gastrostomy tube in appropriate position. Electronically Signed   By: Morgane  Naveau M.D.   On: 07/05/2021 20:27     ____________________________________________   PROCEDURES  Procedure(s) performed (including Critical Care):  Procedures   ____________________________________________   INITIAL IMPRESSION / ASSESSMENT AND PLAN / ED COURSE  On discharge patient is afebrile and has been afebrile has no further abdominal pain and feels well.              ____________________________________________   FINAL CLINICAL IMPRESSION(S) / ED DIAGNOSES  Final diagnoses:  Encounter for feeding tube placement     ED Discharge Orders     None        Note:  This document was prepared using Dragon voice recognition software and may include unintentional dictation errors.    ,  F, MD 07/05/21 2105  

## 2021-07-05 NOTE — ED Triage Notes (Signed)
Presents via EMS from [facility]. Dislodged G tube. On 3L trach collar with saturation 98% per EMS

## 2021-07-05 NOTE — Discharge Instructions (Addendum)
Feeding tube is in place.  X-ray confirms this.  He should do well.  Please return for any increased pain or bleeding or anything else.

## 2021-07-05 NOTE — ED Provider Notes (Addendum)
  Emergency Medicine Provider Triage Evaluation Note   JARRETT ALBOR , a 80 y.o.male,  was evaluated in triage.  Pt complains of RUQ pain and G-tube dislodgement. States that it fell out 3 hours ago. Reports some vomiting. No other symptoms at this time.      Review of Systems  Positive:          Abdominal pain Negative:         Denies fever, chest pain, vomiting   Physical Exam       Vitals:    07/05/21 1549  BP: 109/72  Pulse: 91  Resp: 20  Temp: (!) 102.6 F (39.2 C)  SpO2: 94%    Gen:                Awake, no distress   Resp:               Normal effort  MSK:               Moves extremities without difficulty  Other:                 Medical Decision Making  Given the patient's initial medical screening exam, the following diagnostic evaluation has been ordered. The patient will be placed in the appropriate treatment space, once one is available, to complete the evaluation and treatment. I have discussed the plan of care with the patient and I have advised the patient that an ED physician or mid-level practitioner will reevaluate their condition after the test results have been received, as the results may give them additional insight into the type of treatment they may need.      Diagnostics: abdominal X-ray    Treatments: Viscous lidocaine injected at site of insertion. G-tube replaced with a new 14 french G-tube.    Teodoro Spray, PA 07/05/21 1933             Teodoro Spray, [place] 07/05/21 1959    Nena Polio, MD 07/05/21 2007 Please note Malcome Ambrocio is a 80 year old male who was afebrile.  This is a gentleman that had the feeding tube problem.  Moksh Loomer is the 80 year old who has abdominal pain and a fever.  I have triple check this now.   Nena Polio, MD 07/05/21 2107

## 2021-07-07 ENCOUNTER — Telehealth: Payer: Self-pay | Admitting: Family Medicine

## 2021-07-07 DIAGNOSIS — Z930 Tracheostomy status: Secondary | ICD-10-CM | POA: Diagnosis not present

## 2021-07-07 DIAGNOSIS — J9610 Chronic respiratory failure, unspecified whether with hypoxia or hypercapnia: Secondary | ICD-10-CM | POA: Diagnosis not present

## 2021-07-07 DIAGNOSIS — Z931 Gastrostomy status: Secondary | ICD-10-CM | POA: Diagnosis not present

## 2021-07-07 DIAGNOSIS — E1159 Type 2 diabetes mellitus with other circulatory complications: Secondary | ICD-10-CM | POA: Diagnosis not present

## 2021-07-07 NOTE — Telephone Encounter (Signed)
Copied from [place] [id number]. Topic: Medicare AWV >> Jul 07, 2021 11:33 AM Cher Nakai R wrote: Reason for CRM:  No answer/No VM set up unable to leave a message for patient to call back and schedule Medicare Annual Wellness Visit (AWV) in office.   If unable to come into the office for AWV,  please offer to do virtually or by telephone.  Last AWV: 06/17/2020  Please schedule at anytime with [place].  40 minute appointment  Any questions, please contact me at [phone]

## 2021-07-08 DIAGNOSIS — Z930 Tracheostomy status: Secondary | ICD-10-CM | POA: Diagnosis not present

## 2021-07-08 DIAGNOSIS — J9610 Chronic respiratory failure, unspecified whether with hypoxia or hypercapnia: Secondary | ICD-10-CM | POA: Diagnosis not present

## 2021-07-08 DIAGNOSIS — R1310 Dysphagia, unspecified: Secondary | ICD-10-CM | POA: Diagnosis not present

## 2021-07-08 DIAGNOSIS — Z931 Gastrostomy status: Secondary | ICD-10-CM | POA: Diagnosis not present

## 2021-07-08 DIAGNOSIS — C329 Malignant neoplasm of larynx, unspecified: Secondary | ICD-10-CM | POA: Diagnosis not present

## 2021-07-10 DIAGNOSIS — R0602 Shortness of breath: Secondary | ICD-10-CM | POA: Diagnosis not present

## 2021-07-10 DIAGNOSIS — R0902 Hypoxemia: Secondary | ICD-10-CM | POA: Diagnosis not present

## 2021-07-11 ENCOUNTER — Emergency Department: Payer: Medicare HMO

## 2021-07-11 ENCOUNTER — Encounter: Payer: Self-pay | Admitting: Emergency Medicine

## 2021-07-11 ENCOUNTER — Inpatient Hospital Stay
Admission: EM | Admit: 2021-07-11 | Discharge: 2021-07-15 | DRG: 205 | Disposition: A | Discharge status: Hospice | Payer: Medicare HMO | Attending: Internal Medicine | Admitting: Internal Medicine

## 2021-07-11 ENCOUNTER — Other Ambulatory Visit: Payer: Self-pay

## 2021-07-11 DIAGNOSIS — E43 Unspecified severe protein-calorie malnutrition: Secondary | ICD-10-CM | POA: Diagnosis not present

## 2021-07-11 DIAGNOSIS — R531 Weakness: Secondary | ICD-10-CM

## 2021-07-11 DIAGNOSIS — I10 Essential (primary) hypertension: Secondary | ICD-10-CM | POA: Diagnosis present

## 2021-07-11 DIAGNOSIS — J690 Pneumonitis due to inhalation of food and vomit: Secondary | ICD-10-CM | POA: Diagnosis present

## 2021-07-11 DIAGNOSIS — C329 Malignant neoplasm of larynx, unspecified: Secondary | ICD-10-CM | POA: Diagnosis present

## 2021-07-11 DIAGNOSIS — I700 Atherosclerosis of aorta: Secondary | ICD-10-CM | POA: Diagnosis not present

## 2021-07-11 DIAGNOSIS — I3139 Other pericardial effusion (noninflammatory): Secondary | ICD-10-CM | POA: Diagnosis not present

## 2021-07-11 DIAGNOSIS — C799 Secondary malignant neoplasm of unspecified site: Secondary | ICD-10-CM | POA: Diagnosis not present

## 2021-07-11 DIAGNOSIS — J431 Panlobular emphysema: Secondary | ICD-10-CM | POA: Diagnosis present

## 2021-07-11 DIAGNOSIS — E785 Hyperlipidemia, unspecified: Secondary | ICD-10-CM | POA: Diagnosis present

## 2021-07-11 DIAGNOSIS — I959 Hypotension, unspecified: Secondary | ICD-10-CM | POA: Diagnosis not present

## 2021-07-11 DIAGNOSIS — R0602 Shortness of breath: Secondary | ICD-10-CM

## 2021-07-11 DIAGNOSIS — Z430 Encounter for attention to tracheostomy: Secondary | ICD-10-CM | POA: Diagnosis not present

## 2021-07-11 DIAGNOSIS — T17998A Other foreign object in respiratory tract, part unspecified causing other injury, initial encounter: Secondary | ICD-10-CM

## 2021-07-11 DIAGNOSIS — F32A Depression, unspecified: Secondary | ICD-10-CM | POA: Diagnosis present

## 2021-07-11 DIAGNOSIS — Z9889 Other specified postprocedural states: Secondary | ICD-10-CM

## 2021-07-11 DIAGNOSIS — R739 Hyperglycemia, unspecified: Secondary | ICD-10-CM | POA: Diagnosis not present

## 2021-07-11 DIAGNOSIS — C7989 Secondary malignant neoplasm of other specified sites: Secondary | ICD-10-CM | POA: Diagnosis present

## 2021-07-11 DIAGNOSIS — E1140 Type 2 diabetes mellitus with diabetic neuropathy, unspecified: Secondary | ICD-10-CM | POA: Diagnosis present

## 2021-07-11 DIAGNOSIS — J90 Pleural effusion, not elsewhere classified: Secondary | ICD-10-CM

## 2021-07-11 DIAGNOSIS — J9509 Other tracheostomy complication: Principal | ICD-10-CM | POA: Diagnosis present

## 2021-07-11 DIAGNOSIS — E1165 Type 2 diabetes mellitus with hyperglycemia: Secondary | ICD-10-CM | POA: Diagnosis present

## 2021-07-11 DIAGNOSIS — Y95 Nosocomial condition: Secondary | ICD-10-CM | POA: Diagnosis present

## 2021-07-11 DIAGNOSIS — Z515 Encounter for palliative care: Secondary | ICD-10-CM | POA: Diagnosis not present

## 2021-07-11 DIAGNOSIS — E861 Hypovolemia: Secondary | ICD-10-CM | POA: Diagnosis present

## 2021-07-11 DIAGNOSIS — Z20822 Contact with and (suspected) exposure to covid-19: Secondary | ICD-10-CM | POA: Diagnosis not present

## 2021-07-11 DIAGNOSIS — Z794 Long term (current) use of insulin: Secondary | ICD-10-CM

## 2021-07-11 DIAGNOSIS — Z79890 Hormone replacement therapy (postmenopausal): Secondary | ICD-10-CM

## 2021-07-11 DIAGNOSIS — Z681 Body mass index (BMI) 19 or less, adult: Secondary | ICD-10-CM

## 2021-07-11 DIAGNOSIS — Z8701 Personal history of pneumonia (recurrent): Secondary | ICD-10-CM

## 2021-07-11 DIAGNOSIS — J189 Pneumonia, unspecified organism: Secondary | ICD-10-CM | POA: Diagnosis not present

## 2021-07-11 DIAGNOSIS — E871 Hypo-osmolality and hyponatremia: Secondary | ICD-10-CM | POA: Diagnosis not present

## 2021-07-11 DIAGNOSIS — Z79899 Other long term (current) drug therapy: Secondary | ICD-10-CM

## 2021-07-11 DIAGNOSIS — R0902 Hypoxemia: Secondary | ICD-10-CM

## 2021-07-11 DIAGNOSIS — I4891 Unspecified atrial fibrillation: Secondary | ICD-10-CM | POA: Diagnosis not present

## 2021-07-11 DIAGNOSIS — E8809 Other disorders of plasma-protein metabolism, not elsewhere classified: Secondary | ICD-10-CM | POA: Diagnosis present

## 2021-07-11 DIAGNOSIS — R000 Tachycardia, unspecified: Secondary | ICD-10-CM | POA: Diagnosis not present

## 2021-07-11 DIAGNOSIS — Z48813 Encounter for surgical aftercare following surgery on the respiratory system: Secondary | ICD-10-CM | POA: Diagnosis not present

## 2021-07-11 DIAGNOSIS — R1310 Dysphagia, unspecified: Secondary | ICD-10-CM | POA: Diagnosis not present

## 2021-07-11 DIAGNOSIS — E878 Other disorders of electrolyte and fluid balance, not elsewhere classified: Secondary | ICD-10-CM | POA: Diagnosis present

## 2021-07-11 DIAGNOSIS — J9610 Chronic respiratory failure, unspecified whether with hypoxia or hypercapnia: Secondary | ICD-10-CM | POA: Diagnosis not present

## 2021-07-11 DIAGNOSIS — E039 Hypothyroidism, unspecified: Secondary | ICD-10-CM | POA: Diagnosis present

## 2021-07-11 DIAGNOSIS — T17990A Other foreign object in respiratory tract, part unspecified in causing asphyxiation, initial encounter: Secondary | ICD-10-CM | POA: Diagnosis present

## 2021-07-11 DIAGNOSIS — J9621 Acute and chronic respiratory failure with hypoxia: Secondary | ICD-10-CM | POA: Diagnosis present

## 2021-07-11 DIAGNOSIS — Z833 Family history of diabetes mellitus: Secondary | ICD-10-CM

## 2021-07-11 DIAGNOSIS — G629 Polyneuropathy, unspecified: Secondary | ICD-10-CM | POA: Diagnosis present

## 2021-07-11 DIAGNOSIS — J811 Chronic pulmonary edema: Secondary | ICD-10-CM | POA: Diagnosis not present

## 2021-07-11 DIAGNOSIS — I480 Paroxysmal atrial fibrillation: Secondary | ICD-10-CM | POA: Diagnosis present

## 2021-07-11 DIAGNOSIS — Z8521 Personal history of malignant neoplasm of larynx: Secondary | ICD-10-CM | POA: Diagnosis not present

## 2021-07-11 DIAGNOSIS — J9811 Atelectasis: Secondary | ICD-10-CM | POA: Diagnosis not present

## 2021-07-11 DIAGNOSIS — N529 Male erectile dysfunction, unspecified: Secondary | ICD-10-CM | POA: Diagnosis present

## 2021-07-11 DIAGNOSIS — J159 Unspecified bacterial pneumonia: Secondary | ICD-10-CM | POA: Diagnosis not present

## 2021-07-11 DIAGNOSIS — Z87891 Personal history of nicotine dependence: Secondary | ICD-10-CM

## 2021-07-11 DIAGNOSIS — Z7189 Other specified counseling: Secondary | ICD-10-CM | POA: Diagnosis not present

## 2021-07-11 DIAGNOSIS — E440 Moderate protein-calorie malnutrition: Secondary | ICD-10-CM | POA: Diagnosis present

## 2021-07-11 LAB — CBC WITH DIFFERENTIAL/PLATELET
Abs Immature Granulocytes: 0.07 10*3/uL (ref 0.00–0.07)
Basophils Absolute: 0 10*3/uL (ref 0.0–0.1)
Basophils Relative: 0 %
Eosinophils Absolute: 0 10*3/uL (ref 0.0–0.5)
Eosinophils Relative: 0 %
HCT: 44.6 % (ref 39.0–52.0)
Hemoglobin: 14.5 g/dL (ref 13.0–17.0)
Immature Granulocytes: 1 %
Lymphocytes Relative: 3 %
Lymphs Abs: 0.3 10*3/uL — ABNORMAL LOW (ref 0.7–4.0)
MCH: 30.3 pg (ref 26.0–34.0)
MCHC: 32.5 g/dL (ref 30.0–36.0)
MCV: 93.3 fL (ref 80.0–100.0)
Monocytes Absolute: 0.5 10*3/uL (ref 0.1–1.0)
Monocytes Relative: 5 %
Neutro Abs: 9.3 10*3/uL — ABNORMAL HIGH (ref 1.7–7.7)
Neutrophils Relative %: 91 %
Platelets: 374 10*3/uL (ref 150–400)
RBC: 4.78 MIL/uL (ref 4.22–5.81)
RDW: 14.6 % (ref 11.5–15.5)
WBC: 10.3 10*3/uL (ref 4.0–10.5)
nRBC: 0 % (ref 0.0–0.2)

## 2021-07-11 LAB — RESP PANEL BY RT-PCR (FLU A&B, COVID) ARPGX2
Influenza A by PCR: NEGATIVE
Influenza B by PCR: NEGATIVE
SARS Coronavirus 2 by RT PCR: NEGATIVE

## 2021-07-11 LAB — LACTIC ACID, PLASMA
Lactic Acid, Venous: 3.5 mmol/L (ref 0.5–1.9)
Lactic Acid, Venous: 3.1 mmol/L (ref 0.5–1.9)

## 2021-07-11 LAB — COMPREHENSIVE METABOLIC PANEL
ALT: 18 U/L (ref 0–44)
AST: 22 U/L (ref 15–41)
Albumin: 2.9 g/dL — ABNORMAL LOW (ref 3.5–5.0)
Alkaline Phosphatase: 69 U/L (ref 38–126)
Anion gap: 11 (ref 5–15)
BUN: 32 mg/dL — ABNORMAL HIGH (ref 8–23)
CO2: 24 mmol/L (ref 22–32)
Calcium: 8.8 mg/dL — ABNORMAL LOW (ref 8.9–10.3)
Chloride: 96 mmol/L — ABNORMAL LOW (ref 98–111)
Creatinine, Ser: 0.78 mg/dL (ref 0.61–1.24)
GFR, Estimated: >60 mL/min (ref >60)
Glucose, Bld: 328 mg/dL — ABNORMAL HIGH (ref 70–99)
Potassium: 4.5 mmol/L (ref 3.5–5.1)
Sodium: 131 mmol/L — ABNORMAL LOW (ref 135–145)
Total Bilirubin: 0.8 mg/dL (ref 0.3–1.2)
Total Protein: 6.6 g/dL (ref 6.5–8.1)

## 2021-07-11 LAB — CBG MONITORING, ED: Glucose-Capillary: 220 mg/dL — ABNORMAL HIGH (ref 70–99)

## 2021-07-11 LAB — CBC
HCT: 40.1 % (ref 39.0–52.0)
Hemoglobin: 13.2 g/dL (ref 13.0–17.0)
MCH: 30.3 pg (ref 26.0–34.0)
MCHC: 32.9 g/dL (ref 30.0–36.0)
MCV: 92.2 fL (ref 80.0–100.0)
Platelets: 332 10*3/uL (ref 150–400)
RBC: 4.35 MIL/uL (ref 4.22–5.81)
RDW: 14.4 % (ref 11.5–15.5)
WBC: 9 10*3/uL (ref 4.0–10.5)
nRBC: 0 % (ref 0.0–0.2)

## 2021-07-11 LAB — APTT: aPTT: 28 seconds (ref 24–36)

## 2021-07-11 LAB — CREATININE, SERUM
Creatinine, Ser: 0.75 mg/dL (ref 0.61–1.24)
GFR, Estimated: >60 mL/min (ref >60)

## 2021-07-11 LAB — TROPONIN I (HIGH SENSITIVITY): Troponin I (High Sensitivity): 20 ng/L — ABNORMAL HIGH (ref <18)

## 2021-07-11 LAB — BRAIN NATRIURETIC PEPTIDE: B Natriuretic Peptide: 176.2 pg/mL — ABNORMAL HIGH (ref 0.0–100.0)

## 2021-07-11 LAB — PROTIME-INR
INR: 1.1 (ref 0.8–1.2)
Prothrombin Time: 14.3 seconds (ref 11.4–15.2)

## 2021-07-11 LAB — PROCALCITONIN: Procalcitonin: <0.1 ng/mL

## 2021-07-11 MED ORDER — INSULIN ASPART 100 UNIT/ML IJ SOLN
0.0000 [IU] | Freq: Three times a day (TID) | INTRAMUSCULAR | Status: DC
  Administered 2021-07-11: 7 [IU] via SUBCUTANEOUS
  Administered 2021-07-13: 4 [IU] via SUBCUTANEOUS
  Administered 2021-07-13: 3 [IU] via SUBCUTANEOUS
  Administered 2021-07-13: 4 [IU] via SUBCUTANEOUS
  Administered 2021-07-13: 15 [IU] via SUBCUTANEOUS
  Administered 2021-07-14: 4 [IU] via SUBCUTANEOUS
  Administered 2021-07-14: 11 [IU] via SUBCUTANEOUS
  Administered 2021-07-14: 4 [IU] via SUBCUTANEOUS
  Administered 2021-07-14: 15 [IU] via SUBCUTANEOUS
  Administered 2021-07-15: 11 [IU] via SUBCUTANEOUS
  Filled 2021-07-11 (×10): qty 1

## 2021-07-11 MED ORDER — ENOXAPARIN SODIUM 40 MG/0.4ML IJ SOSY
40.0000 mg | PREFILLED_SYRINGE | INTRAMUSCULAR | Status: DC
  Administered 2021-07-11: 40 mg via SUBCUTANEOUS
  Filled 2021-07-11: qty 0.4

## 2021-07-11 MED ORDER — PIPERACILLIN-TAZOBACTAM 3.375 G IVPB: 3.3750 g | Freq: Three times a day (TID) | INTRAVENOUS | Status: DC

## 2021-07-11 MED ORDER — SODIUM CHLORIDE 0.9 % IV SOLN: 2.0000 g | INTRAVENOUS | Status: DC

## 2021-07-11 MED ORDER — TRAZODONE HCL 50 MG PO TABS: 25.0000 mg | ORAL_TABLET | Freq: Every evening | ORAL | Status: DC | PRN

## 2021-07-11 MED ORDER — SODIUM CHLORIDE 0.9 % IV SOLN: 500.0000 mg | INTRAVENOUS | Status: DC

## 2021-07-11 MED ORDER — PIPERACILLIN-TAZOBACTAM 3.375 G IVPB 30 MIN
3.3750 g | Freq: Once | INTRAVENOUS | Status: DC
  Filled 2021-07-11: qty 50

## 2021-07-11 MED ORDER — ONDANSETRON HCL 4 MG PO TABS: 4.0000 mg | ORAL_TABLET | Freq: Four times a day (QID) | ORAL | Status: DC | PRN

## 2021-07-11 MED ORDER — PIPERACILLIN-TAZOBACTAM 3.375 G IVPB 30 MIN: 3.3750 g | Freq: Four times a day (QID) | INTRAVENOUS | Status: DC

## 2021-07-11 MED ORDER — VANCOMYCIN HCL 1250 MG/250ML IV SOLN
1250.0000 mg | INTRAVENOUS | Status: AC
  Administered 2021-07-11: 1250 mg via INTRAVENOUS
  Filled 2021-07-11: qty 250

## 2021-07-11 MED ORDER — GUAIFENESIN 100 MG/5ML PO LIQD
15.0000 mL | Freq: Four times a day (QID) | ORAL | Status: DC
Start: 2021-07-11 — End: 2021-07-12
  Filled 2021-07-11 (×6): qty 15

## 2021-07-11 MED ORDER — VANCOMYCIN HCL IN DEXTROSE 1-5 GM/200ML-% IV SOLN: 1000.0000 mg | Freq: Once | INTRAVENOUS | Status: DC

## 2021-07-11 MED ORDER — ONDANSETRON HCL 4 MG/2ML IJ SOLN: 4.0000 mg | Freq: Four times a day (QID) | INTRAMUSCULAR | Status: DC | PRN

## 2021-07-11 MED ORDER — INSULIN GLARGINE-YFGN 100 UNIT/ML ~~LOC~~ SOPN
8.0000 [IU] | PEN_INJECTOR | Freq: Every day | SUBCUTANEOUS | Status: DC
  Filled 2021-07-11: qty 3

## 2021-07-11 MED ORDER — ROSUVASTATIN CALCIUM 5 MG PO TABS
5.0000 mg | ORAL_TABLET | Freq: Every day | ORAL | Status: DC
  Filled 2021-07-11: qty 1

## 2021-07-11 MED ORDER — SODIUM CHLORIDE 0.9 % IV SOLN
500.0000 mg | INTRAVENOUS | Status: DC
  Administered 2021-07-11: 500 mg via INTRAVENOUS
  Filled 2021-07-11 (×2): qty 500

## 2021-07-11 MED ORDER — LEVOTHYROXINE SODIUM 50 MCG PO TABS: 75.0000 ug | ORAL_TABLET | Freq: Every day | ORAL | Status: DC

## 2021-07-11 MED ORDER — MAGNESIUM HYDROXIDE 400 MG/5ML PO SUSP: 30.0000 mL | Freq: Every day | ORAL | Status: DC | PRN

## 2021-07-11 MED ORDER — CHLORHEXIDINE GLUCONATE 0.12 % MT SOLN
15.0000 mL | Freq: Two times a day (BID) | OROMUCOSAL | Status: DC
  Administered 2021-07-12 – 2021-07-15 (×7): 15 mL via OROMUCOSAL
  Filled 2021-07-11 (×6): qty 15

## 2021-07-11 MED ORDER — ACETAMINOPHEN 325 MG PO TABS: 650.0000 mg | ORAL_TABLET | Freq: Four times a day (QID) | ORAL | Status: DC | PRN

## 2021-07-11 MED ORDER — SODIUM CHLORIDE 0.9 % IV BOLUS (SEPSIS)
1000.0000 mL | Freq: Once | INTRAVENOUS | Status: AC
  Administered 2021-07-11: 1000 mL via INTRAVENOUS

## 2021-07-11 MED ORDER — SODIUM CHLORIDE 0.9 % IV SOLN: INTRAVENOUS | Status: DC

## 2021-07-11 MED ORDER — IOHEXOL 300 MG/ML  SOLN
75.0000 mL | Freq: Once | INTRAMUSCULAR | Status: AC | PRN
  Administered 2021-07-11: 75 mL via INTRAVENOUS

## 2021-07-11 MED ORDER — SODIUM CHLORIDE 0.9 % IV BOLUS (SEPSIS)
250.0000 mL | Freq: Once | INTRAVENOUS | Status: AC
  Administered 2021-07-11: 250 mL via INTRAVENOUS

## 2021-07-11 MED ORDER — MAGNESIUM OXIDE 400 MG PO TABS
400.0000 mg | ORAL_TABLET | Freq: Two times a day (BID) | ORAL | Status: DC
  Administered 2021-07-12 – 2021-07-15 (×8): 400 mg
  Filled 2021-07-11 (×17): qty 1

## 2021-07-11 MED ORDER — SODIUM CHLORIDE 0.9 % IV SOLN
1.0000 g | Freq: Once | INTRAVENOUS | Status: AC
  Administered 2021-07-11: 1 g via INTRAVENOUS
  Filled 2021-07-11: qty 1

## 2021-07-11 MED ORDER — INSULIN GLARGINE-YFGN 100 UNIT/ML ~~LOC~~ SOLN
8.0000 [IU] | Freq: Every day | SUBCUTANEOUS | Status: DC
  Administered 2021-07-12 (×2): 8 [IU] via SUBCUTANEOUS
  Filled 2021-07-11 (×3): qty 0.08

## 2021-07-11 MED ORDER — FOLIC ACID 1 MG PO TABS: 1.0000 mg | ORAL_TABLET | Freq: Every day | ORAL | Status: DC

## 2021-07-11 MED ORDER — VANCOMYCIN HCL IN DEXTROSE 1-5 GM/200ML-% IV SOLN: 1000.0000 mg | INTRAVENOUS | Status: DC

## 2021-07-11 MED ORDER — SERTRALINE HCL 50 MG PO TABS: 25.0000 mg | ORAL_TABLET | Freq: Every day | ORAL | Status: DC

## 2021-07-11 MED ORDER — SODIUM CHLORIDE 0.9 % IV BOLUS (SEPSIS)
500.0000 mL | Freq: Once | INTRAVENOUS | Status: AC
  Administered 2021-07-11: 500 mL via INTRAVENOUS

## 2021-07-11 MED ORDER — ACETAMINOPHEN 325 MG RE SUPP: 650.0000 mg | Freq: Four times a day (QID) | RECTAL | Status: DC | PRN

## 2021-07-11 NOTE — H&P (Addendum)
[place]   PATIENT NAME: Connor Aguilar    MR#:  [id number]  DATE OF BIRTH:  02/26/1941  DATE OF ADMISSION:  07/11/2021  PRIMARY CARE PHYSICIAN: Delsa Grana, PA-C   Patient is coming from: Home  REQUESTING/REFERRING PHYSICIAN: Lavonia Drafts, MD  CHIEF COMPLAINT:  Shortness of breath and hypoxia  HISTORY OF PRESENT ILLNESS:  Connor Aguilar is a 80 y.o. male with medical history significant for metastatic laryngeal cancer status post laryngectomy/XRT/chemotherapy with tracheostomy and PEG-tube placement, hypertension, type 2 diabetes mellitus and dyslipidemia, who presented to the ER with acute onset of hypoxemia at [facility] where he resides.  His pulse oximetry was 90% on 8 L trach collar and his SNF staff had to suction him multiple times today. The patient just finished an antibiotic course for pneumonia yesterday.   He was noted to be more short of breath with increased work of breathing today.  No further history could be obtained from the patient.  The patient has been recently admitted to [facility] for pneumonia and disseminated zoster from 10/25-2010/29 and was later readmitted on 10/30 and discharged on 06/17/2021 for altered mental status with somnolence and profound weakness likely due to linezolid/Valtrex.  ED Course: Upon presentation to the emergency room, respiratory rate was 27 and heart rate 122 with a temperature of 96.8 axillary and pulse currently was 95% on 3 L of O2 and later 91%.  During my interview is requiring 10 L and upon his discharge from [facility] on 11 9 he was on 5 L flow rate with trach mask.  Labs revealed hyponatremia with sodium of 131 with hypochloremia of 96 and hyperglycemia of 328 BUN was 32 and calcium 8.8.  Albumin was 2.9 with normal LFTs otherwise.  Troponin was 20.  Lactic acid was 3.5 and CBC was within normal.  Influenza antigens and COVID-19 PCR came back negative.  Blood cultures were drawn.  EKG as reviewed by me : EKG showed sinus  tachycardia with rate 122 with multifocal PVCs and low voltage QRS. Imaging: Portable chest ray showed the following: 1. Bilateral pleural effusions, larger on the right. 2. Interstitial pulmonary edema. 3. Bibasilar airspace consolidation cannot be excluded. Chest CT with contrast revealed: Interval neck dissection, tracheostomy, and fistula creation between the cervical esophagus and trachea at the tracheostomy possibly reflecting spit fistula formation. Debris within the proximal esophagus and within the a trachea just below the fistula plants may reflect changes of reflux and aspiration.   Large right and moderate left pleural effusions with compressive atelectasis of the lungs bilaterally, right greater than left. Superimposed extensive airway impaction predominantly within the right middle and lower lobes.   Asymmetric focal thickening of the mid esophagus. While this may relate to focal secretions, a superimposed mass is difficult to exclude and is not well assessed on this examination. This would be better assessed with dedicated endoscopy once the patient's acute issues have resolved, if indicated.   Extensive multi-vessel coronary artery calcification.   Aortic Atherosclerosis  The patient was given 1 g of IV cefepime.  He will be admitted to a stepdown unit bed for further evaluation and management.  PAST MEDICAL HISTORY:   Past Medical History:  Diagnosis Date   Allergy    Diabetes mellitus without complication ([place])    Elevated CPK 11/18/2015   Hyperlipidemia    Hypertension    Tobacco abuse   -Metastatic laryngeal cancer status post laryngectomy/XRT/chemotherapy with tracheostomy and PEG-tube placemen -Chronic Hypoxemic Respiratory  Failure S/p Trach I Hx Recurrent PNA I Pleural Effusions -Hx MDR enterobacter and MRSA PNA PAST SURGICAL HISTORY:   Past Surgical History:  Procedure Laterality Date   FOOT SURGERY      SOCIAL HISTORY:   Social History    Tobacco Use   Smoking status: Former    Packs/day: 1.50    Years: 64.00    Pack years: 96.00    Types: Cigarettes    Start date: 08/09/1956    Quit date: 08/09/2020    Years since quitting: 0.9   Smokeless tobacco: Never  Substance Use Topics   Alcohol use: No    Alcohol/week: 0.0 standard drinks    FAMILY HISTORY:   Family History  Problem Relation Age of Onset   Diabetes Mother     DRUG ALLERGIES:  No Known Allergies  REVIEW OF SYSTEMS:   ROS As per history of present illness. All pertinent systems were reviewed above. Constitutional, HEENT, cardiovascular, respiratory, GI, GU, musculoskeletal, neuro, psychiatric, endocrine, integumentary and hematologic systems were reviewed and are otherwise negative/unremarkable except for positive findings mentioned above in the HPI.   MEDICATIONS AT HOME:   Prior to Admission medications   Medication Sig Start Date End Date Taking? Authorizing Provider  chlorhexidine (PERIDEX) 0.12 % solution 15 mLs by Mouth Rinse route 2 (two) times daily. 06/17/21  Yes [provider]  folic acid (FOLVITE) 1 MG tablet Give 1 tablet (1 mg total) by G-tube route in the morning. 01/16/21 01/16/22 Yes [provider]  HUMALOG 100 UNIT/ML injection Inject 0-12 Units into the skin 2 (two) times daily. 06/30/21  Yes [provider]  insulin glargine-yfgn (SEMGLEE) 100 UNIT/ML Pen Inject 8 Units into the skin at bedtime. 06/30/21  Yes [provider]  levofloxacin (LEVAQUIN) 750 MG tablet Take 750 mg by mouth daily. 07/03/21  Yes [provider]  levothyroxine (SYNTHROID) 75 MCG tablet Take 1 tablet (75 mcg total) by mouth daily. 03/18/21  Yes Delsa Grana, PA-C  magnesium oxide (MAG-OX) 400 MG tablet Place 1 tablet (400 mg total) into feeding tube daily. Patient taking differently: Place 400 mg into feeding tube 2 (two) times daily. 03/18/21  Yes Delsa Grana, PA-C  metroNIDAZOLE (FLAGYL) 500 MG tablet Take 500 mg  by mouth 3 (three) times daily.   Yes [provider]  rosuvastatin (CRESTOR) 5 MG tablet TAKE ONE TABLET EVERY DAY AT BEDTIME 12/01/20  Yes Delsa Grana, PA-C  sertraline (ZOLOFT) 25 MG tablet Take 25 mg by mouth daily. 02/06/21  Yes [provider]  sodium chloride HYPERTONIC 3 % nebulizer solution Take 4 mLs by nebulization every 8 (eight) hours. 06/18/21  Yes [provider]  acetaminophen (TYLENOL) 325 MG tablet Take 2 tablets (650 mg total) by mouth every 6 (six) hours as needed for mild pain (or Fever >/= 101). 07/30/15   Nicholes Mango, MD  blood glucose meter kit and supplies Accuchek or whatever is covered by insurance; check sugars once a day only if desired; Dx E11.65, LON 99 months 12/13/17   Lada, Satira Anis, MD  gabapentin (NEURONTIN) 100 MG capsule Take 100 mg by mouth 3 (three) times daily. Patient not taking: Reported on 07/11/2021 10/28/20 10/28/21  [provider]  glucose blood test strip Use as instructed to check sugars once a day if desired; LON 99 months, Dx E11.65 12/13/17   Arnetha Courser, MD  ipratropium-albuterol (DUONEB) 0.5-2.5 (3) MG/3ML SOLN Take 3 mLs by nebulization every 6 (six) hours as needed (wheeze  and cough (can do separate from saline)). 05/18/21   Delsa Grana, PA-C  LORazepam (ATIVAN) 0.5 MG tablet Take 1 tablet (0.5 mg total) by mouth every 8 (eight) hours as needed for anxiety (N/V). Patient not taking: Reported on 07/11/2021 03/18/21   Delsa Grana, PA-C  nystatin (MYCOSTATIN) 100000 UNIT/ML suspension  04/14/21   [provider]  Oxycodone HCl 10 MG TABS Take 10 mg by mouth every 4 (four) hours as needed. Patient not taking: Reported on 07/11/2021 11/11/20   [provider]  polyethylene glycol powder (GLYCOLAX/MIRALAX) 17 GM/SCOOP powder Take 17 g by mouth daily as needed.    [provider]  promethazine-dextromethorphan (PROMETHAZINE-DM) 6.25-15 MG/5ML syrup Take 2.5-5 mLs by mouth 3 (three) times daily as  needed for cough. Patient not taking: Reported on 07/11/2021 05/18/21   Delsa Grana, PA-C      VITAL SIGNS:  Blood pressure 103/72, pulse (!) 105, temperature (!) 96.8 F (36 C), temperature source Axillary, resp. rate 19, height '5\' 8"'  (1.727 m), weight 56.7 kg, SpO2 98 %.  PHYSICAL EXAMINATION:  Physical Exam  GENERAL: Acutely ill 80 y.o.-year-old patient lying in the bed with tracheostomy mask in place.  He was somnolent but easily arousable.   EYES: Pupils equal, round, reactive to light and accommodation. No scleral icterus. Extraocular muscles intact.  HEENT: Head atraumatic, normocephalic. Oropharynx and nasopharynx clear.  NECK:  Supple, no jugular venous distention. No thyroid enlargement, no tenderness.  Tracheostomy in place. LUNGS: Diminished bibasal and midlung zone breath sounds. No use of accessory muscles of respiration.  CARDIOVASCULAR: Regular rate and rhythm, S1, S2 normal. No murmurs, rubs, or gallops.  ABDOMEN: Soft, nondistended, nontender. Bowel sounds present. No organomegaly or mass.  G-tube in place. EXTREMITIES: No pedal edema, cyanosis, or clubbing.  NEUROLOGIC: Grossly nonfocal with no lateralizing signs. PSYCHIATRIC: Normal affect and good eye contact. SKIN: No obvious rash, lesion, or ulcer.   LABORATORY PANEL:   CBC Recent Labs  Lab 07/11/21 1731  WBC 10.3  HGB 14.5  HCT 44.6  PLT 374   ------------------------------------------------------------------------------------------------------------------  Chemistries  Recent Labs  Lab 07/11/21 1731  NA 131*  K 4.5  CL 96*  CO2 24  GLUCOSE 328*  BUN 32*  CREATININE 0.78  CALCIUM 8.8*  AST 22  ALT 18  ALKPHOS 69  BILITOT 0.8   ------------------------------------------------------------------------------------------------------------------  Cardiac Enzymes No results for input(s): TROPONINI in the last 168  hours. ------------------------------------------------------------------------------------------------------------------  RADIOLOGY:  CT Chest W Contrast  Result Date: 07/11/2021 CLINICAL DATA:  Pneumonia, effusion or abscess suspected, xray done. Hypoxia, excessive respiratory secretion. EXAM: CT CHEST WITH CONTRAST TECHNIQUE: Multidetector CT imaging of the chest was performed during intravenous contrast administration. CONTRAST:  76m OMNIPAQUE IOHEXOL 300 MG/ML  SOLN COMPARISON:  03/20/2020 FINDINGS: Cardiovascular: Extensive multi-vessel coronary artery calcification is present. Global cardiac size is within normal limits. Trace pericardial effusion. The central pulmonary arteries are of normal caliber. Moderate atherosclerotic calcification within the thoracic aorta. No aortic aneurysm. Mediastinum/Nodes: A stoma related to prior tracheostomy is identified at the a base of the neck anteriorly. In this region, there is an additional device creating a communication between the cervical esophagus and the tracheostomy stoma possibly representing a spit fistula, best seen on axial image # 27/7 and sagittal image # 88/6. There is debris within the proximal and mid esophagus. There is, additionally, debris within the trachea just below the fistula appliance possibly related to aspiration, best seen on image # 37/7 and sagittal image # 92/6. There  is asymmetric focal thickening of the midesophagus best seen on axial image # 61/7, not well assessed on this examination. No pathologic thoracic adenopathy. Numerous surgical clips are seen at the neck base related to bilateral neck dissection. Infiltration of the fascial planes at the neck base likely relates to changes of prior radiation therapy. Small residual probable thyroid tissue at the left neck base adjacent to the tracheostomy stoma. Lungs/Pleura: Large right and moderate left pleural effusions are present with near complete collapse of the right middle and  lower lobes and compressive atelectasis of the right upper lobe and left lower lobe. There is extensive airway impaction within the collapsed right middle and lower lobes noted. No definite central obstructing mass. Upper Abdomen: Balloon retention gastrostomy catheter is in place. No acute abnormality within the visualized upper abdomen. Musculoskeletal: No acute bone abnormality. Interval development of a remote appearing T9 anterior wedge compression fracture with 60-70% loss of height. IMPRESSION: Interval neck dissection, tracheostomy, and fistula creation between the cervical esophagus and trachea at the tracheostomy possibly reflecting spit fistula formation. Debris within the proximal esophagus and within the a trachea just below the fistula plants may reflect changes of reflux and aspiration. Large right and moderate left pleural effusions with compressive atelectasis of the lungs bilaterally, right greater than left. Superimposed extensive airway impaction predominantly within the right middle and lower lobes. Asymmetric focal thickening of the mid esophagus. While this may relate to focal secretions, a superimposed mass is difficult to exclude and is not well assessed on this examination. This would be better assessed with dedicated endoscopy once the patient's acute issues have resolved, if indicated. Extensive multi-vessel coronary artery calcification. Aortic Atherosclerosis (ICD10-I70.0). Electronically Signed   By: Fidela Salisbury M.D.   On: 07/11/2021 19:34   DG Chest Port 1 View  Result Date: 07/11/2021 CLINICAL DATA:  Questionable sepsis. EXAM: PORTABLE CHEST 1 VIEW COMPARISON:  May 19, 2021 FINDINGS: The cardiac silhouette is partially obscured by right pleural effusion. Tortuosity and calcific atherosclerotic disease of the aorta. Incomplete inspiration. Bilateral pleural effusions, larger on the right. Interstitial pulmonary edema. Bibasilar airspace consolidation cannot be excluded.  Osseous structures are without acute abnormality. Stable postsurgical changes. IMPRESSION: 1. Bilateral pleural effusions, larger on the right. 2. Interstitial pulmonary edema. 3. Bibasilar airspace consolidation cannot be excluded. Electronically Signed   By: Fidela Salisbury M.D.   On: 07/11/2021 17:46      IMPRESSION AND PLAN:  Principal Problem:   HCAP (healthcare-associated pneumonia)  1.  Healthcare associated pneumonia, very likely aspiration pneumonia.  The patient has subsequent mild sepsis as manifested by tachypnea of 27 and tachycardia of 122.  He meets severe sepsis criteria given lactic acid of 3.5. - The patient will be admitted to a stepdown unit bed given increased need for recurrent endotracheal suctions. - We will continue antibiotic therapy with IV Zosyn and vancomycin as well as Zithromax. - Mucolytic therapy will be provided per G-tube. - Bronchodilator therapy will be provided. -O2 protocol will be followed. - We will follow blood and sputum culture as well as pneumonia antigens. - He will be hydrated with IV normal saline. - We will follow lactic acid level. - The patient has extensive airway impaction predominantly within the right middle and lower lobes for which she will get frequent endotracheal suctions as mentioned above.  He may benefit from pulmonary consult for bronchoscopy as well. - Debris within the proximal esophagus and within the a trachea just below the fistula plants may reflect  changes of reflux and aspiration.  We will obtain an ENT consultation.  I notified Dr. Pryor Ochoa about the patient.  2.  Bilateral pleural effusions likely parapneumonic with compressive atelectasis. - The patient has BNP came back elevated at 176 however he does not seem to be clinically fluid overloaded and his chest CT revealed no interstitial pulmonary edema. - We will continue management of his pneumonia as above and monitor his oxygenation.  3. Acute on chronic hypoxic  respiratory failure secondary to #1 and #2. - O2 protocol will be followed. - Management otherwise as above.  4. Asymmetric focal thickening of the mid esophagus. While this may relate to focal secretions, a superimposed mass is difficult to exclude. - The patient may need a dedicated upper GI endoscopy to rule out esophageal mass after improved of acute phase.   5.  Mild azotemia that could be related to mild volume depletion. - He will be initially cautiously hydrated with IV normal saline and will follow his BMP.  6.  Hyponatremia that is likely hypovolemic. - He will be hydrated as mentioned above and will follow BMP.  7.  Uncontrolled type 2 diabetes mellitus with hyperglycemia. - The patient will be placed on supplement coverage with NovoLog and will continue his basal coverage.  8.  Hypoalbuminemia of 2.9 with a BMI of 19 like secondary to protein energy malnutrition. - Dietitian consult can be obtained for G-tube feeding after management of his highly suspicious aspiration pneumonia. - Upon his discharge on 11/9 from [facility] he was on: Nutren 1.5 at goal rate 85 mL/hr cycled over 14 hrs overnight. Feeding Tube Flush 128m every 6h. - Given highly suspected aspiration and reflux we will hold off his G-tube feedings at this time.  9.  Peripheral neuropathy. - We will continue Neurontin.  10.  Hypothyroidism. - We will continue Synthroid.  11.  Dyslipidemia. - We will continue statin therapy.  12.  Depression. - We will continue Zoloft.   DVT prophylaxis: Lovenox. Code Status: full code. Family Communication:  The plan of care was discussed in details with the patient (and family). I answered all questions. The patient agreed to proceed with the above mentioned plan. Further management will depend upon hospital course. Disposition Plan: Back to previous home environment Consults called: ENT consult. All the records are reviewed and case discussed with ED provider.  Status  is: Inpatient  Remains inpatient appropriate because:Ongoing diagnostic testing needed not appropriate for outpatient work up, Unsafe d/c plan, IV treatments appropriate due to intensity of illness or inability to take PO, and Inpatient level of care appropriate due to severity of illness   Dispo: The patient is from: Home              Anticipated d/c is to: Home              Patient currently is not medically stable to d/c.              Difficult to place patient: No  TOTAL TIME TAKING CARE OF THIS PATIENT: 60 minutes.     JChristel MormonM.D on 07/11/2021 at 8:04 PM  [facility]   From 7 PM-7 AM, contact night-coverage [email or web address]  CC: Primary care physician; TDelsa Grana PA-C

## 2021-07-11 NOTE — ED Notes (Signed)
Patient transported to CT 

## 2021-07-11 NOTE — ED Provider Notes (Signed)
[facility] Emergency Department Provider Note   ____________________________________________    I have reviewed the triage vital signs and the nursing notes.   HISTORY  Chief Complaint Shortness of breath  Patient unable to provide any history   HPI Connor Aguilar is a 80 y.o. male with a history of diabetes, laryngeal cancer, G-tube, tracheotomy brought in for evaluation for shortness of breath.  Reportedly is finishing up antibiotics for treatment for pneumonia, found to be with increased work of breathing and hypoxic, unclear what baseline oxygen requirements are.  Patient is unable to provide any history  Past Medical History:  Diagnosis Date   Allergy    Diabetes mellitus without complication ([place])    Elevated CPK 11/18/2015   Hyperlipidemia    Hypertension    Tobacco abuse     Patient Active Problem List   Diagnosis Date Noted   HCAP (healthcare-associated pneumonia) 07/11/2021   Hypothyroidism 01/23/2021   Mood disorder ([place]) 01/15/2021   Diarrhea 12/22/2020   G tube feedings ([place]) 12/22/2020   Generalized weakness 12/22/2020   Hyponatremia 12/22/2020   S/P laryngectomy 12/22/2020   Moderate protein-calorie malnutrition ([place]) 11/26/2020   Hypokalemia 10/09/2020   Metastatic squamous cell carcinoma to head and neck ([place]) 08/22/2020   Goals of care, counseling/discussion 08/19/2020   Laryngeal cancer ([place]) 08/19/2020   Chronic arthropathy 10/25/2019   Onychomycosis of multiple toenails with type 2 diabetes mellitus ([place]) 05/16/2018   Erectile dysfunction 08/19/2015   Type 2 diabetes mellitus without complication ([place]) 72/82/0601   Aspiration pneumonia ([place]) 07/27/2015   Tobacco abuse 04/09/2015   Hyperlipidemia 04/02/2015   Hypertension 04/02/2015   Panlobular emphysema ([place]) 04/02/2015    Past Surgical History:  Procedure Laterality Date   FOOT SURGERY      Prior to Admission medications   Medication Sig Start Date End  Date Taking? Authorizing Provider  chlorhexidine (PERIDEX) 0.12 % solution 15 mLs by Mouth Rinse route 2 (two) times daily. 06/17/21  Yes [provider]  folic acid (FOLVITE) 1 MG tablet Give 1 tablet (1 mg total) by G-tube route in the morning. 01/16/21 01/16/22 Yes [provider]  HUMALOG 100 UNIT/ML injection Inject 0-12 Units into the skin 2 (two) times daily. 06/30/21  Yes [provider]  insulin glargine-yfgn (SEMGLEE) 100 UNIT/ML Pen Inject 8 Units into the skin at bedtime. 06/30/21  Yes [provider]  levofloxacin (LEVAQUIN) 750 MG tablet Take 750 mg by mouth daily. 07/03/21  Yes [provider]  levothyroxine (SYNTHROID) 75 MCG tablet Take 1 tablet (75 mcg total) by mouth daily. 03/18/21  Yes Delsa Grana, PA-C  magnesium oxide (MAG-OX) 400 MG tablet Place 1 tablet (400 mg total) into feeding tube daily. Patient taking differently: Place 400 mg into feeding tube 2 (two) times daily. 03/18/21  Yes Delsa Grana, PA-C  metroNIDAZOLE (FLAGYL) 500 MG tablet Take 500 mg by mouth 3 (three) times daily.   Yes [provider]  rosuvastatin (CRESTOR) 5 MG tablet TAKE ONE TABLET EVERY DAY AT BEDTIME 12/01/20  Yes Delsa Grana, PA-C  sertraline (ZOLOFT) 25 MG tablet Take 25 mg by mouth daily. 02/06/21  Yes [provider]  sodium chloride HYPERTONIC 3 % nebulizer solution Take 4 mLs by nebulization every 8 (eight) hours. 06/18/21  Yes [provider]  acetaminophen (TYLENOL) 325 MG tablet Take 2 tablets (650 mg total) by mouth every 6 (six) hours as needed for mild pain (or Fever >/= 101). 07/30/15   Gouru, Illene Silver,  MD  blood glucose meter kit and supplies Accuchek or whatever is covered by insurance; check sugars once a day only if desired; Dx E11.65, LON 99 months 12/13/17   Lada, Satira Anis, MD  gabapentin (NEURONTIN) 100 MG capsule Take 100 mg by mouth 3 (three) times daily. Patient not taking: Reported on 07/11/2021 10/28/20 10/28/21   [provider]  glucose blood test strip Use as instructed to check sugars once a day if desired; LON 99 months, Dx E11.65 12/13/17   Arnetha Courser, MD  ipratropium-albuterol (DUONEB) 0.5-2.5 (3) MG/3ML SOLN Take 3 mLs by nebulization every 6 (six) hours as needed (wheeze and cough (can do separate from saline)). 05/18/21   Delsa Grana, PA-C  LORazepam (ATIVAN) 0.5 MG tablet Take 1 tablet (0.5 mg total) by mouth every 8 (eight) hours as needed for anxiety (N/V). Patient not taking: Reported on 07/11/2021 03/18/21   Delsa Grana, PA-C  nystatin (MYCOSTATIN) 100000 UNIT/ML suspension  04/14/21   [provider]  Oxycodone HCl 10 MG TABS Take 10 mg by mouth every 4 (four) hours as needed. Patient not taking: Reported on 07/11/2021 11/11/20   [provider]  polyethylene glycol powder (GLYCOLAX/MIRALAX) 17 GM/SCOOP powder Take 17 g by mouth daily as needed.    [provider]  promethazine-dextromethorphan (PROMETHAZINE-DM) 6.25-15 MG/5ML syrup Take 2.5-5 mLs by mouth 3 (three) times daily as needed for cough. Patient not taking: Reported on 07/11/2021 05/18/21   Delsa Grana, PA-C     Allergies Patient has no known allergies.  Family History  Problem Relation Age of Onset   Diabetes Mother     Social History Social History   Tobacco Use   Smoking status: Former    Packs/day: 1.50    Years: 64.00    Pack years: 96.00    Types: Cigarettes    Start date: 08/09/1956    Quit date: 08/09/2020    Years since quitting: 0.9   Smokeless tobacco: Never  Vaping Use   Vaping Use: Never used  Substance Use Topics   Alcohol use: No    Alcohol/week: 0.0 standard drinks   Drug use: No    Level 5 caveat: Unable to obtain review of Systems     ____________________________________________   PHYSICAL EXAM:  VITAL SIGNS: ED Triage Vitals  Enc Vitals Group     BP 07/11/21 1722 104/76     Pulse Rate 07/11/21 1722 (!) 122     Resp 07/11/21 1722 (!) 27     Temp  07/11/21 1722 (!) 96.8 F (36 C)     Temp Source 07/11/21 1722 Axillary     SpO2 07/11/21 1722 91 %     Weight 07/11/21 1736 56.7 kg (125 lb)     Height 07/11/21 1736 1.727 m (_0 )     Head Circumference --      Peak Flow --      Pain Score --      Pain Loc --      Pain Edu? --      Excl. in [place]? --     Constitutional: Patient is apparently at his baseline mental status which is that he occasionally opens his eyes and looks at you Eyes: Conjunctivae are normal.  Head: Atraumatic. Nose: No congestion/rhinnorhea. Mouth/Throat: Mucous membranes are moist.   Neck: Unusually dry stoma with what appears to be a cannulation, Cardiovascular: Normal rate, regular rhythm. Grossly normal heart sounds.  Good peripheral circulation. Respiratory: Normal respiratory effort.  No retractions.  Creased breath sounds bilaterally Gastrointestinal: Soft and nontender.  G-tube noted, no distention.   Genitourinary: deferred Musculoskeletal:  Warm and well perfused Neurologic:   No gross focal neurologic deficits are appreciated.  Skin:  Skin is warm, dry and intact. No rash noted.   ____________________________________________   LABS (all labs ordered are listed, but only abnormal results are displayed)  Labs Reviewed  LACTIC ACID, PLASMA - Abnormal; Notable for the following components:      Result Value   Lactic Acid, Venous 3.5 (*)    All other components within normal limits  COMPREHENSIVE METABOLIC PANEL - Abnormal; Notable for the following components:   Sodium 131 (*)    Chloride 96 (*)    Glucose, Bld 328 (*)    BUN 32 (*)    Calcium 8.8 (*)    Albumin 2.9 (*)    All other components within normal limits  CBC WITH DIFFERENTIAL/PLATELET - Abnormal; Notable for the following components:   Neutro Abs 9.3 (*)    Lymphs Abs 0.3 (*)    All other components within normal limits  TROPONIN I (HIGH SENSITIVITY) - Abnormal; Notable for the following components:   Troponin I (High  Sensitivity) 20 (*)    All other components within normal limits  RESP PANEL BY RT-PCR (FLU A&B, COVID) ARPGX2  CULTURE, BLOOD (ROUTINE X 2)  CULTURE, BLOOD (ROUTINE X 2)  URINE CULTURE  PROTIME-INR  APTT  LACTIC ACID, PLASMA  URINALYSIS, COMPLETE (UACMP) WITH MICROSCOPIC   ____________________________________________  EKG  ED ECG REPORT I, Lavonia Drafts, the attending physician, personally viewed and interpreted this ECG.  Date: 07/11/2021  Rhythm: normal sinus rhythm QRS Axis: normal Intervals: normal ST/T Wave abnormalities: normal Narrative Interpretation: no evidence of acute ischemia  ____________________________________________  RADIOLOGY Chest x-ray viewed by me, bilateral pleural effusions  CT scan of the chest with IV contrast performed ____________________________________________   PROCEDURES  Procedure(s) performed: No  Procedures   Critical Care performed: yes  CRITICAL CARE Performed by: Lavonia Drafts   Total critical care time: 30 minutes  Critical care time was exclusive of separately billable procedures and treating other patients.  Critical care was necessary to treat or prevent imminent or life-threatening deterioration.  Critical care was time spent personally by me on the following activities: development of treatment plan with patient and/or surrogate as well as nursing, discussions with consultants, evaluation of patient's response to treatment, examination of patient, obtaining history from patient or surrogate, ordering and performing treatments and interventions, ordering and review of laboratory studies, ordering and review of radiographic studies, pulse oximetry and re-evaluation of patient's condition.  ____________________________________________   INITIAL IMPRESSION / ASSESSMENT AND PLAN / ED COURSE  Pertinent labs & imaging results that were available during my care of the patient were reviewed by me and considered in my  medical decision making (see chart for details).   Patient presents with shortness of breath, oxygen requirement of 6 to 8 L via trach collar required to keep saturations above 90%.  Inreased breath sounds on exam, chest x-ray demonstrates bilateral pleural effusions which appear to be new.  Sent for CT with IV contrast of the chest to evaluate further given elevated lactic acid, normal white blood cell count, will cover with broad-spectrum IV antibiotics for possible pneumonia  RT has suctioned and cleared away dried   Have admitted to Dr. Sidney Ace of the hospitalist service    ____________________________________________   FINAL CLINICAL IMPRESSION(S) / ED DIAGNOSES  Final diagnoses:  Pleural effusion  SOB (shortness of breath)  Hypoxia        Note:  This document was prepared using Dragon voice recognition software and may include unintentional dictation errors.    Lavonia Drafts, MD 07/11/21 [phone]

## 2021-07-11 NOTE — Consult Note (Signed)
CODE SEPSIS - PHARMACY COMMUNICATION  **Broad Spectrum Antibiotics should be administered within 1 hour of Sepsis diagnosis**  *duplicate - 1st antibiotic given > 1hr prior to code activation*  Time Code Sepsis Called/Page Received: 2119  Antibiotics Ordered: cefepime, vancomycin  Time of 1st antibiotic administration: 1937  Additional action taken by pharmacy: none  If necessary, Name of Provider/Nurse Contacted: none

## 2021-07-11 NOTE — ED Triage Notes (Signed)
Pt arrives via EMS from [facility]. Finished abx for PNA yesterday. Reports they had to suction pt multiple times today. 90% on 8L trach collar.

## 2021-07-11 NOTE — Consult Note (Signed)
Pharmacy Antibiotic Note  Connor Aguilar is a 80 y.o. male admitted on 07/11/2021 with pneumonia.  Pharmacy has been consulted for Vancomycin dosing.  Plan: Vancomycin 1250mg  loading dose then, Vancomycin 1000 mg IV Q 24 hrs.  Goal AUC 400-550. Expected AUC: 473 SCr used: 0.8 (actual 0.75) Wt used: TBW < IBW  Height: 5\' 8"  (172.7 cm) Weight: 56.7 kg (125 lb) IBW/kg (Calculated) : 68.4  Temp (24hrs), Avg:97.3 F (36.3 C), Min:96.8 F (36 C), Max:97.7 F (36.5 C)  Recent Labs  Lab 07/11/21 1731 07/11/21 2023  WBC 10.3 9.0  CREATININE 0.78 0.75  LATICACIDVEN 3.5* 3.1*    Estimated Creatinine Clearance: 57.1 mL/min (by C-G formula based on SCr of 0.75 mg/dL).    No Known Allergies  Antimicrobials this admission: 12/3 Cefepime >>  12/3 Vancomycin >>   Dose adjustments this admission:none  Microbiology results: 12/3 BCx: ordered 12/3 MRSA PCR: ordered  Thank you for allowing pharmacy to be a part of this patient's care.  Chaim Gatley Rodriguez-Guzman PharmD, BCPS 07/11/2021 9:12 PM

## 2021-07-11 NOTE — Sepsis Progress Note (Signed)
Elink following for Sepsis Protocol 

## 2021-07-12 ENCOUNTER — Other Ambulatory Visit: Payer: Self-pay

## 2021-07-12 ENCOUNTER — Encounter: Payer: Self-pay | Admitting: Family Medicine

## 2021-07-12 DIAGNOSIS — J90 Pleural effusion, not elsewhere classified: Secondary | ICD-10-CM

## 2021-07-12 DIAGNOSIS — E1165 Type 2 diabetes mellitus with hyperglycemia: Secondary | ICD-10-CM

## 2021-07-12 DIAGNOSIS — E871 Hypo-osmolality and hyponatremia: Secondary | ICD-10-CM | POA: Diagnosis not present

## 2021-07-12 DIAGNOSIS — C7989 Secondary malignant neoplasm of other specified sites: Secondary | ICD-10-CM | POA: Diagnosis not present

## 2021-07-12 DIAGNOSIS — J9621 Acute and chronic respiratory failure with hypoxia: Secondary | ICD-10-CM

## 2021-07-12 DIAGNOSIS — T17998A Other foreign object in respiratory tract, part unspecified causing other injury, initial encounter: Secondary | ICD-10-CM

## 2021-07-12 LAB — BASIC METABOLIC PANEL
Anion gap: 6 (ref 5–15)
BUN: 22 mg/dL (ref 8–23)
CO2: 24 mmol/L (ref 22–32)
Calcium: 7.9 mg/dL — ABNORMAL LOW (ref 8.9–10.3)
Chloride: 106 mmol/L (ref 98–111)
Creatinine, Ser: 0.61 mg/dL (ref 0.61–1.24)
GFR, Estimated: >60 mL/min (ref >60)
Glucose, Bld: 91 mg/dL (ref 70–99)
Potassium: 3.6 mmol/L (ref 3.5–5.1)
Sodium: 136 mmol/L (ref 135–145)

## 2021-07-12 LAB — URINALYSIS, COMPLETE (UACMP) WITH MICROSCOPIC
Bacteria, UA: NONE SEEN
Bilirubin Urine: NEGATIVE
Glucose, UA: 100 mg/dL — AB
Hgb urine dipstick: NEGATIVE
Ketones, ur: NEGATIVE mg/dL
Leukocytes,Ua: NEGATIVE
Nitrite: NEGATIVE
Protein, ur: 30 mg/dL — AB
Specific Gravity, Urine: 1.02 (ref 1.005–1.030)
Squamous Epithelial / HPF: NONE SEEN (ref 0–5)
pH: 6 (ref 5.0–8.0)

## 2021-07-12 LAB — MRSA NEXT GEN BY PCR, NASAL: MRSA by PCR Next Gen: NOT DETECTED

## 2021-07-12 LAB — CBC
HCT: 37.6 % — ABNORMAL LOW (ref 39.0–52.0)
Hemoglobin: 12.3 g/dL — ABNORMAL LOW (ref 13.0–17.0)
MCH: 30.1 pg (ref 26.0–34.0)
MCHC: 32.7 g/dL (ref 30.0–36.0)
MCV: 91.9 fL (ref 80.0–100.0)
Platelets: 307 10*3/uL (ref 150–400)
RBC: 4.09 MIL/uL — ABNORMAL LOW (ref 4.22–5.81)
RDW: 14.6 % (ref 11.5–15.5)
WBC: 8.1 10*3/uL (ref 4.0–10.5)
nRBC: 0 % (ref 0.0–0.2)

## 2021-07-12 LAB — GLUCOSE, CAPILLARY
Glucose-Capillary: 112 mg/dL — ABNORMAL HIGH (ref 70–99)
Glucose-Capillary: 80 mg/dL (ref 70–99)
Glucose-Capillary: 89 mg/dL (ref 70–99)
Glucose-Capillary: 91 mg/dL (ref 70–99)
Glucose-Capillary: 103 mg/dL — ABNORMAL HIGH (ref 70–99)

## 2021-07-12 LAB — LACTIC ACID, PLASMA
Lactic Acid, Venous: 1.7 mmol/L (ref 0.5–1.9)
Lactic Acid, Venous: 1.8 mmol/L (ref 0.5–1.9)
Lactic Acid, Venous: 3.7 mmol/L (ref 0.5–1.9)

## 2021-07-12 LAB — STREP PNEUMONIAE URINARY ANTIGEN: Strep Pneumo Urinary Antigen: NEGATIVE

## 2021-07-12 MED ORDER — FOLIC ACID 1 MG PO TABS
1.0000 mg | ORAL_TABLET | Freq: Every day | ORAL | Status: DC
  Administered 2021-07-12 – 2021-07-15 (×4): 1 mg
  Filled 2021-07-12 (×4): qty 1

## 2021-07-12 MED ORDER — ROSUVASTATIN CALCIUM 10 MG PO TABS
5.0000 mg | ORAL_TABLET | Freq: Every day | ORAL | Status: DC
  Administered 2021-07-12 – 2021-07-14 (×3): 5 mg
  Filled 2021-07-12 (×3): qty 1

## 2021-07-12 MED ORDER — DIGOXIN 0.25 MG/ML IJ SOLN
0.2500 mg | Freq: Once | INTRAMUSCULAR | Status: AC
Start: 2021-07-12 — End: 2021-07-12
  Administered 2021-07-12: 14:00:00 0.25 mg via INTRAVENOUS
  Filled 2021-07-12: qty 2

## 2021-07-12 MED ORDER — SERTRALINE HCL 50 MG PO TABS
25.0000 mg | ORAL_TABLET | Freq: Every day | ORAL | Status: DC
  Administered 2021-07-12 – 2021-07-15 (×4): 25 mg
  Filled 2021-07-12 (×4): qty 1

## 2021-07-12 MED ORDER — GUAIFENESIN 100 MG/5ML PO LIQD
15.0000 mL | Freq: Four times a day (QID) | ORAL | Status: DC
  Filled 2021-07-12 (×3): qty 15

## 2021-07-12 MED ORDER — ACETAMINOPHEN 650 MG RE SUPP: 650.0000 mg | Freq: Four times a day (QID) | RECTAL | Status: DC | PRN

## 2021-07-12 MED ORDER — PROSOURCE TF PO LIQD
45.0000 mL | Freq: Every day | ORAL | Status: DC
  Administered 2021-07-13 – 2021-07-15 (×3): 45 mL
  Filled 2021-07-12 (×3): qty 45

## 2021-07-12 MED ORDER — ACETAMINOPHEN 325 MG PO TABS: 650.0000 mg | ORAL_TABLET | Freq: Four times a day (QID) | ORAL | Status: DC | PRN

## 2021-07-12 MED ORDER — PIPERACILLIN-TAZOBACTAM 3.375 G IVPB
3.3750 g | Freq: Three times a day (TID) | INTRAVENOUS | Status: DC
  Administered 2021-07-12: 04:00:00 3.375 g via INTRAVENOUS
  Filled 2021-07-12: qty 50

## 2021-07-12 MED ORDER — FREE WATER
30.0000 mL | Status: DC
  Administered 2021-07-12 – 2021-07-15 (×16): 30 mL

## 2021-07-12 MED ORDER — ACETYLCYSTEINE 20 % IN SOLN
2.0000 mL | Freq: Three times a day (TID) | RESPIRATORY_TRACT | Status: DC
  Administered 2021-07-12: 20:00:00 2 mL via RESPIRATORY_TRACT
  Administered 2021-07-12: 14:00:00 4 mL via RESPIRATORY_TRACT
  Administered 2021-07-13: 2 mL via RESPIRATORY_TRACT
  Administered 2021-07-13 (×2): 4 mL via RESPIRATORY_TRACT
  Administered 2021-07-14 – 2021-07-15 (×6): 2 mL via RESPIRATORY_TRACT
  Filled 2021-07-12 (×11): qty 4

## 2021-07-12 MED ORDER — DEXTROSE 50 % IV SOLN
INTRAVENOUS | Status: AC
  Administered 2021-07-12: 05:00:00 12.5 g via INTRAVENOUS
  Filled 2021-07-12: qty 50

## 2021-07-12 MED ORDER — LEVOTHYROXINE SODIUM 50 MCG PO TABS
75.0000 ug | ORAL_TABLET | Freq: Every day | ORAL | Status: DC
  Administered 2021-07-12 – 2021-07-15 (×4): 75 ug
  Filled 2021-07-12 (×4): qty 2

## 2021-07-12 MED ORDER — TRAZODONE HCL 50 MG PO TABS: 25.0000 mg | ORAL_TABLET | Freq: Every evening | ORAL | Status: DC | PRN

## 2021-07-12 MED ORDER — GUAIFENESIN 100 MG/5ML PO LIQD
15.0000 mL | Freq: Four times a day (QID) | ORAL | Status: DC
  Administered 2021-07-12 – 2021-07-15 (×13): 15 mL
  Filled 2021-07-12 (×19): qty 15

## 2021-07-12 MED ORDER — DEXTROSE 50 % IV SOLN: 12.5000 g | INTRAVENOUS | Status: AC

## 2021-07-12 MED ORDER — CHLORHEXIDINE GLUCONATE CLOTH 2 % EX PADS
6.0000 | MEDICATED_PAD | Freq: Every day | CUTANEOUS | Status: DC
  Administered 2021-07-12 – 2021-07-15 (×4): 6 via TOPICAL

## 2021-07-12 MED ORDER — OSMOLITE 1.5 CAL PO LIQD
1000.0000 mL | ORAL | Status: DC
  Administered 2021-07-12 – 2021-07-15 (×3): 1000 mL

## 2021-07-12 MED ORDER — MAGNESIUM HYDROXIDE 400 MG/5ML PO SUSP: 30.0000 mL | Freq: Every day | ORAL | Status: DC | PRN

## 2021-07-12 MED ORDER — IPRATROPIUM-ALBUTEROL 0.5-2.5 (3) MG/3ML IN SOLN
3.0000 mL | Freq: Four times a day (QID) | RESPIRATORY_TRACT | Status: DC
  Administered 2021-07-12 – 2021-07-15 (×14): 3 mL via RESPIRATORY_TRACT
  Filled 2021-07-12 (×14): qty 3

## 2021-07-12 NOTE — Consult Note (Signed)
Connor Aguilar, Connor Aguilar [id number] 10-Feb-1941 Connor Hones, MD  Reason for Consult: respiratory distress  HPI: 80 y.o. male presented to ER on 12/3 with history of progressive shortness of breath.  History of laryngeal cancer s/p total laryngectomy and neck dissections in 01/22 at [facility] with Dr. Ihor Aguilar.  Following that required g-tube placement and also underwent TEP placement.  He has been working intermittently with speech therapy for TEP use but is unable to talk much through its use.  Son reports he has been having issues with breathing with increased work of breathing as well as hypoxia.  Asked to evaluate given findings on CT scan.  Patient denies pain.   Reports increased work of breathing.  Denies current hemoptysis.  Has history of dysphagia which was thought to be secondary to stricture at neopharynx.  Per son, Connor Aguilar was trying to schedule dilation but this has not been done due to patient becoming repeatedly ill.  Allergies: No Known Allergies  ROS: Review of systems normal other than 12 systems except per HPI.  PMH:  Past Medical History:  Diagnosis Date   Allergy    Diabetes mellitus without complication ([place])    Elevated CPK 11/18/2015   Hyperlipidemia    Hypertension    Tobacco abuse     FH:  Family History  Problem Relation Age of Onset   Diabetes Mother     SH:  Social History   Socioeconomic History   Marital status: Widowed    Spouse name: Not on file   Number of children: 2   Years of education: Not on file   Highest education level: Not on file  Occupational History   Not on file  Tobacco Use   Smoking status: Former    Packs/day: 1.50    Years: 64.00    Pack years: 96.00    Types: Cigarettes    Start date: 08/09/1956    Quit date: 08/09/2020    Years since quitting: 0.9   Smokeless tobacco: Never  Vaping Use   Vaping Use: Never used  Substance and Sexual Activity   Alcohol use: No    Alcohol/week: 0.0 standard drinks   Drug use: No   Sexual activity:  Never  Other Topics Concern   Not on file  Social History Narrative   Pt wife passed away November 09, 2014. Lives between his two sons.    Social Determinants of Health   Financial Resource Strain: Low Risk    Difficulty of Paying Living Expenses: Not hard at all  Food Insecurity: Not on file  Transportation Needs: No Transportation Needs   Lack of Transportation (Medical): No   Lack of Transportation (Non-Medical): No  Physical Activity: Not on file  Stress: Not on file  Social Connections: Not on file  Intimate Partner Violence: Not on file    PSH:  Past Surgical History:  Procedure Laterality Date   FOOT SURGERY      Physical  Exam:  GEN-  supine in bed, moderate increased work of breathing EARS- clear bilateraly NOSE- clear OC/OP- edentulous, clear oral cavity NECK-  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  Significant crusting throughout tracheostoma and extending into trachea with 99% occlusion of airway.  No visible saliva dripping from TEP. NEURO-  patient responsive to questions with head shaking but limited due to patient's increased work of breathing RESP- tachypnea with high pitch noise with inspiration and expiration through tracheostoma CARD-  tachycardic  Procedure:  Tracheal debridement with tracheoscopy through existing tracheostoma:  After verbal  consent was obtained from patient and son, debridement of crusting of trachea was perfomed.  Using forceps and clamps the crusting was gently removed from the tracheostoma.  This was difficult as obstruction was fixed with surrounding skin 365 degrees and bleeding was noted at times.  When this would obstruct his pin-hold opening patient was breathing from, this would increase his work of breathing.  Asistance with procedure from Respiratory therapy as well as Pulmonlogy attending was appreciated.  Using saline, the crusting was gradually softened and the plug was continued to be debrided until the large  plug was delivered from the patient's stoma.  At this time, a flexible laryngoscope was inserted into the patient's stoma for visualization of the patient's carina was made.  This showed some thick but clear mucus being expressed from right main stem.  At this time, humidified oxygen was continued and patient's increased work of breathing was significantly improved.  CT-   A/P: History of laryngeal cancer s/p total laryngectomy  Plan:  Continue humidification and saline to stoma to help prevent drying out of stoma.  Needs to be at a facility that is able to take care of patient's stoma to prevent significant blockage as what occurred.  I did not see any signs of significant leakage around TEP but would recommend follow up with Connor Aguilar at [facility] speech therapy to consider replacement.  Patient also needs follow up with [facility] to consider dilation of neopharynx.  Extensive effusions of lungs and per radiology right lung airway impaction as well, - consider pulm evaluation to determine if would benefit from intervention.   Connor Aguilar 07/12/2021 11:31 AM

## 2021-07-12 NOTE — Progress Notes (Addendum)
Initial Nutrition Assessment  DOCUMENTATION CODES:   Not applicable  INTERVENTION:   Initiate Osmolite 1.5@55ml /hr + ProSource TF 66ml daily via tube   Free water flushes 33ml q4 hours to maintain tube patency   Regimen provides 2020kcal/day, 94g/day protein and 1164ml/day free water   NUTRITION DIAGNOSIS:   Inadequate oral intake related to dysphagia (head and neck cancer) as evidenced by NPO status (pt with chronic G-tube).  GOAL:   Patient will meet greater than or equal to 90% of their needs  MONITOR:   Labs, Weight trends, TF tolerance, Skin, I & O's  REASON FOR ASSESSMENT:   Consult Enteral/tube feeding initiation and management  ASSESSMENT:   80 y.o. male with medical history significant for metastatic laryngeal cancer status post laryngectomy(March 2022)/XRT/chemotherapy with tracheostomy and IR G-tube placement (March 2022), hypertension, type 2 diabetes mellitus and dyslipidemia who is admitted with HCAP and sepsis.  RD working remotely.  -Pt s/p 59F IR replaced G-tube 11/27  Pt admitted from [facility] with chronic G-tube. Unable to reach anyone from SNF who knows pt's home tube feed regimen; will start standard formula for now. Per chart, pt with ~7lb weight gain since G-tube placement. Suspect pt at low refeed risk. Will follow up to obtain nutrition related exam and history at follow up. Pt at high risk for malnutrition.   Medications reviewed and include: folic acid, insulin, synthroid, Mg oxide  Labs reviewed: Na 136 wnl, K 3.6 wnl Cbgs- 91, 89, 80, 112 x 24 hrs AIC 6.0(H)- 8/9  NUTRITION - FOCUSED PHYSICAL EXAM: Unable to perform at this time   Diet Order:   Diet Order             Diet NPO time specified  Diet effective now                  EDUCATION NEEDS:   No education needs have been identified at this time  Skin:  Skin Assessment: Reviewed RN Assessment  Last BM:  pta  Height:   Ht Readings from Last 1 Encounters:  07/11/21  5\' 8"  (1.727 m)    Weight:   Wt Readings from Last 1 Encounters:  07/11/21 56.7 kg    Ideal Body Weight:  70 kg  BMI:  Body mass index is 19.01 kg/m.  Estimated Nutritional Needs:   Kcal:  1700-2000kcal/day  Protein:  85-100g/day  Fluid:  1.4-1.7L/day  Koleen Distance MS, RD, LDN Please refer to [facility] for RD and/or RD on-call/weekend/after hours pager

## 2021-07-12 NOTE — Plan of Care (Signed)
  Problem: Education: Goal: Knowledge of General Education information will improve Description: Including pain rating scale, medication(s)/side effects and non-pharmacologic comfort measures Outcome: Not Progressing   Problem: Health Behavior/Discharge Planning: Goal: Ability to manage health-related needs will improve Outcome: Not Progressing   Problem: Clinical Measurements: Goal: Ability to maintain clinical measurements within normal limits will improve Outcome: Not Progressing Goal: Will remain free from infection Outcome: Not Progressing Goal: Diagnostic test results will improve Outcome: Not Progressing Goal: Respiratory complications will improve Outcome: Not Progressing Goal: Cardiovascular complication will be avoided Outcome: Not Progressing   Problem: Activity: Goal: Risk for activity intolerance will decrease Outcome: Not Progressing   Problem: Nutrition: Goal: Adequate nutrition will be maintained Outcome: Not Progressing   Problem: Coping: Goal: Level of anxiety will decrease Outcome: Not Progressing   Problem: Elimination: Goal: Will not experience complications related to bowel motility Outcome: Not Progressing Goal: Will not experience complications related to urinary retention Outcome: Not Progressing   Problem: Pain Managment: Goal: General experience of comfort will improve Outcome: Not Progressing   Problem: Safety: Goal: Ability to remain free from injury will improve Outcome: Not Progressing   Problem: Skin Integrity: Goal: Risk for impaired skin integrity will decrease Outcome: Not Progressing  Patient total care unable to make all needs known

## 2021-07-12 NOTE — Progress Notes (Signed)
1220 pt afib let attending know will re-eval in 1 he

## 2021-07-12 NOTE — ED Notes (Signed)
Pt turned onto their right side at this time.

## 2021-07-12 NOTE — Plan of Care (Signed)

## 2021-07-12 NOTE — Progress Notes (Signed)
PROGRESS NOTE    Connor Aguilar  YQI:[id number] DOB: August 06, 1941 DOA: 07/11/2021 PCP: Delsa Grana, PA-C   Chief complaint.  Shortness of breath. Brief Narrative:  Connor Aguilar is a 80 y.o. male with medical history significant for metastatic laryngeal cancer status post laryngectomy/XRT/chemotherapy with tracheostomy and PEG-tube placement, hypertension, type 2 diabetes mellitus and dyslipidemia, who presented to the ER with acute onset of hypoxemia at [facility] where he resides.  He just completed antibiotics yesterday for aspiration pneumonia. Upon arriving the emergency room, he was a placed on 40% oxygen. Checks x-ray showed bilateral pleural effusion, large right side, mucous impaction.   Assessment & Plan:   Principal Problem:   HCAP (healthcare-associated pneumonia)  Acute on chronic respiratory failure with hypoxemia secondary to mucous plug and a large pleural effusion. Bilateral pleural effusion, larger on right side. Mucous plug. Severe sepsis is ruled out as patient does not have infection source. I personally reviewed patient chest x-ray, no evidence of pneumonia. Procalcitonin level less than 0.1, also does not support antibiotic use.  I will discontinue antibiotics for now. I will obtain DuoNeb, Mucomyst nebulization. Also obtain thoracentesis. Pleural effusion is highly unlikely to be infectious.  I will send culture, cell count, LDH and protein.  Specifically will send for cytology to rule out metastasis.   Uncontrolled type 2 diabetes with hyperglycemia. Continue Lantus and sliding scale insulin.  Head and neck cancer status post tracheostomy and PEG. Restart tube feeding, dietitian consulted.     DVT prophylaxis: SCDs Code Status: full Family Communication: Called son, unable to reach. Disposition Plan:    Status is: Inpatient  Remains inpatient appropriate because: Severity of disease, pending procedure.        I/O last 3 completed  shifts: In: 2917.9 [I.V.:534.4; IV Piggyback:2383.5] Out: 250 [Urine:250] Total I/O In: 405.5 [I.V.:399.8; IV Piggyback:5.7] Out: 300 [Urine:300]     Consultants:  None  Procedures: None  Antimicrobials: None  Subjective: She is still on 40% oxygen today, still short of breath.  Does not seem to have worsening airway secretion. Denies any abdominal pain or nausea vomiting. No fever chills  No dysuria hematuria.  Objective: Vitals:   07/12/21 0800 07/12/21 0900 07/12/21 1000 07/12/21 1100  BP: (!) 148/98 130/70 131/80 (!) 138/98  Pulse: 96 94 92 (!) 39  Resp: (!) 25 (!) 23 (!) 24 (!) 31  Temp:      TempSrc:      SpO2: 96% 96% 95% 92%  Weight:      Height:        Intake/Output Summary (Last 24 hours) at 07/12/2021 1124 Last data filed at 07/12/2021 1100 Gross per 24 hour  Intake 3323.31 ml  Output 550 ml  Net 2773.31 ml   Filed Weights   07/11/21 1736  Weight: 56.7 kg    Examination:  General exam: Appears calm and comfortable  Respiratory system: Clear to auscultation. Respiratory effort normal. Cardiovascular system: S1 & S2 heard, RRR. No JVD, murmurs, rubs, gallops or clicks. No pedal edema. Gastrointestinal system: Abdomen is nondistended, soft and nontender. No organomegaly or masses felt. Normal bowel sounds heard. Central nervous system: Alert and oriented x2. No focal neurological deficits. Extremities: Symmetric 5 x 5 power. Skin: No rashes, lesions or ulcers Psychiatry: Mood & affect appropriate.     Data Reviewed: I have personally reviewed following labs and imaging studies  CBC: Recent Labs  Lab 07/11/21 1731 07/11/21 2023  WBC 10.3 9.0  NEUTROABS 9.3*  --   HGB  14.5 13.2  HCT 44.6 40.1  MCV 93.3 92.2  PLT 374 008   Basic Metabolic Panel: Recent Labs  Lab 07/11/21 1731 07/11/21 2023  NA 131*  --   K 4.5  --   CL 96*  --   CO2 24  --   GLUCOSE 328*  --   BUN 32*  --   CREATININE 0.78 0.75  CALCIUM 8.8*  --     GFR: Estimated Creatinine Clearance: 57.1 mL/min (by C-G formula based on SCr of 0.75 mg/dL). Liver Function Tests: Recent Labs  Lab 07/11/21 1731  AST 22  ALT 18  ALKPHOS 69  BILITOT 0.8  PROT 6.6  ALBUMIN 2.9*   No results for input(s): LIPASE, AMYLASE in the last 168 hours. No results for input(s): AMMONIA in the last 168 hours. Coagulation Profile: Recent Labs  Lab 07/11/21 1731  INR 1.1   Cardiac Enzymes: No results for input(s): CKTOTAL, CKMB, CKMBINDEX, TROPONINI in the last 168 hours. BNP (last 3 results) No results for input(s): PROBNP in the last 8760 hours. HbA1C: No results for input(s): HGBA1C in the last 72 hours. CBG: Recent Labs  Lab 07/11/21 2207 07/12/21 0523 07/12/21 0758  GLUCAP 220* 112* 80   Lipid Profile: No results for input(s): CHOL, HDL, LDLCALC, TRIG, CHOLHDL, LDLDIRECT in the last 72 hours. Thyroid Function Tests: No results for input(s): TSH, T4TOTAL, FREET4, T3FREE, THYROIDAB in the last 72 hours. Anemia Panel: No results for input(s): VITAMINB12, FOLATE, FERRITIN, TIBC, IRON, RETICCTPCT in the last 72 hours. Sepsis Labs: Recent Labs  Lab 07/11/21 1731 07/11/21 2023 07/11/21 2340 07/12/21 0424  PROCALCITON  --  <0.10  --   --   LATICACIDVEN 3.5* 3.1* 3.7* 1.7    Recent Results (from the past 240 hour(s))  Resp Panel by RT-PCR (Flu A&B, Covid) Nasopharyngeal Swab     Status: None   Collection Time: 07/11/21  5:31 PM   Specimen: Nasopharyngeal Swab; Nasopharyngeal(NP) swabs in vial transport medium  Result Value Ref Range Status   SARS Coronavirus 2 by RT PCR NEGATIVE NEGATIVE Final    Comment: (NOTE) SARS-CoV-2 target nucleic acids are NOT DETECTED.  The SARS-CoV-2 RNA is generally detectable in upper respiratory specimens during the acute phase of infection. The lowest concentration of SARS-CoV-2 viral copies this assay can detect is 138 copies/mL. A negative result does not preclude SARS-Cov-2 infection and should not  be used as the sole basis for treatment or other patient management decisions. A negative result may occur with  improper specimen collection/handling, submission of specimen other than nasopharyngeal swab, presence of viral mutation(s) within the areas targeted by this assay, and inadequate number of viral copies(<138 copies/mL). A negative result must be combined with clinical observations, patient history, and epidemiological information. The expected result is Negative.  Fact Sheet for Patients:  EntrepreneurPulse.com.au  Fact Sheet for Healthcare Providers:  IncredibleEmployment.be  This test is no t yet approved or cleared by the [place] FDA and  has been authorized for detection and/or diagnosis of SARS-CoV-2 by FDA under an Emergency Use Authorization (EUA). This EUA will remain  in effect (meaning this test can be used) for the duration of the COVID-19 declaration under Section 564(b)(1) of the Act, 21 U.S.C.section 360bbb-3(b)(1), unless the authorization is terminated  or revoked sooner.       Influenza A by PCR NEGATIVE NEGATIVE Final   Influenza B by PCR NEGATIVE NEGATIVE Final    Comment: (NOTE) The Xpert Xpress SARS-CoV-2/FLU/RSV plus assay is  intended as an aid in the diagnosis of influenza from Nasopharyngeal swab specimens and should not be used as a sole basis for treatment. Nasal washings and aspirates are unacceptable for Xpert Xpress SARS-CoV-2/FLU/RSV testing.  Fact Sheet for Patients: EntrepreneurPulse.com.au  Fact Sheet for Healthcare Providers: IncredibleEmployment.be  This test is not yet approved or cleared by the [place] FDA and has been authorized for detection and/or diagnosis of SARS-CoV-2 by FDA under an Emergency Use Authorization (EUA). This EUA will remain in effect (meaning this test can be used) for the duration of the COVID-19 declaration under Section  564(b)(1) of the Act, 21 U.S.C. section 360bbb-3(b)(1), unless the authorization is terminated or revoked.  Performed at [facility], [address] [place], [place] [address]   Blood Culture (routine x 2)     Status: None (Preliminary result)   Collection Time: 07/11/21  5:34 PM   Specimen: BLOOD  Result Value Ref Range Status   Specimen Description   Final    BLOOD Blood Culture results may not be optimal due to an inadequate volume of blood received in culture bottles   Special Requests   Final    BOTTLES DRAWN AEROBIC AND ANAEROBIC LEFT ANTECUBITAL   Culture   Final    NO GROWTH < 24 HOURS Performed at [facility], [address] [place], [place] [address]    Report Status PENDING  Incomplete  Blood Culture (routine x 2)     Status: None (Preliminary result)   Collection Time: 07/11/21  5:44 PM   Specimen: BLOOD  Result Value Ref Range Status   Specimen Description   Final    BLOOD Blood Culture results may not be optimal due to an inadequate volume of blood received in culture bottles   Special Requests   Final    BOTTLES DRAWN AEROBIC AND ANAEROBIC LEFT ANTECUBITAL   Culture   Final    NO GROWTH < 24 HOURS Performed at [facility], [address] [place], [place] [address]    Report Status PENDING  Incomplete  MRSA Next Gen by PCR, Nasal     Status: None   Collection Time: 07/12/21  5:03 AM   Specimen: Nasal Mucosa; Nasal Swab  Result Value Ref Range Status   MRSA by PCR Next Gen NOT DETECTED NOT DETECTED Final    Comment: (NOTE) The GeneXpert MRSA Assay (FDA approved for NASAL specimens only), is one component of a comprehensive MRSA colonization surveillance program. It is not intended to diagnose MRSA infection nor to guide or monitor treatment for MRSA infections. Test performance is not FDA approved in patients less than 23 years old. Performed at [facility], [address], [place], [place] [address]           Radiology Studies: CT Chest W Contrast  Result Date: 07/11/2021 CLINICAL DATA:  Pneumonia, effusion or abscess suspected, xray done. Hypoxia, excessive respiratory secretion. EXAM: CT CHEST WITH CONTRAST TECHNIQUE: Multidetector CT imaging of the chest was performed during intravenous contrast administration. CONTRAST:  9mL OMNIPAQUE IOHEXOL 300 MG/ML  SOLN COMPARISON:  03/20/2020 FINDINGS: Cardiovascular: Extensive multi-vessel coronary artery calcification is present. Global cardiac size is within normal limits. Trace pericardial effusion. The central pulmonary arteries are of normal caliber. Moderate atherosclerotic calcification within the thoracic aorta. No aortic aneurysm. Mediastinum/Nodes: A stoma related to prior tracheostomy is identified at the a base of the neck anteriorly. In this region, there is an additional device creating a communication between the cervical  esophagus and the tracheostomy stoma possibly representing a spit fistula, best seen on axial image # 27/7 and sagittal image # 88/6. There is debris within the proximal and mid esophagus. There is, additionally, debris within the trachea just below the fistula appliance possibly related to aspiration, best seen on image # 37/7 and sagittal image # 92/6. There is asymmetric focal thickening of the midesophagus best seen on axial image # 61/7, not well assessed on this examination. No pathologic thoracic adenopathy. Numerous surgical clips are seen at the neck base related to bilateral neck dissection. Infiltration of the fascial planes at the neck base likely relates to changes of prior radiation therapy. Small residual probable thyroid tissue at the left neck base adjacent to the tracheostomy stoma. Lungs/Pleura: Large right and moderate left pleural effusions are present with near complete collapse of the right middle and lower lobes and compressive atelectasis of the right upper lobe and left lower lobe. There is extensive  airway impaction within the collapsed right middle and lower lobes noted. No definite central obstructing mass. Upper Abdomen: Balloon retention gastrostomy catheter is in place. No acute abnormality within the visualized upper abdomen. Musculoskeletal: No acute bone abnormality. Interval development of a remote appearing T9 anterior wedge compression fracture with 60-70% loss of height. IMPRESSION: Interval neck dissection, tracheostomy, and fistula creation between the cervical esophagus and trachea at the tracheostomy possibly reflecting spit fistula formation. Debris within the proximal esophagus and within the a trachea just below the fistula plants may reflect changes of reflux and aspiration. Large right and moderate left pleural effusions with compressive atelectasis of the lungs bilaterally, right greater than left. Superimposed extensive airway impaction predominantly within the right middle and lower lobes. Asymmetric focal thickening of the mid esophagus. While this may relate to focal secretions, a superimposed mass is difficult to exclude and is not well assessed on this examination. This would be better assessed with dedicated endoscopy once the patient's acute issues have resolved, if indicated. Extensive multi-vessel coronary artery calcification. Aortic Atherosclerosis (ICD10-I70.0). Electronically Signed   By: Fidela Salisbury M.D.   On: 07/11/2021 19:34   DG Chest Port 1 View  Result Date: 07/11/2021 CLINICAL DATA:  Questionable sepsis. EXAM: PORTABLE CHEST 1 VIEW COMPARISON:  May 19, 2021 FINDINGS: The cardiac silhouette is partially obscured by right pleural effusion. Tortuosity and calcific atherosclerotic disease of the aorta. Incomplete inspiration. Bilateral pleural effusions, larger on the right. Interstitial pulmonary edema. Bibasilar airspace consolidation cannot be excluded. Osseous structures are without acute abnormality. Stable postsurgical changes. IMPRESSION: 1. Bilateral  pleural effusions, larger on the right. 2. Interstitial pulmonary edema. 3. Bibasilar airspace consolidation cannot be excluded. Electronically Signed   By: Fidela Salisbury M.D.   On: 07/11/2021 17:46        Scheduled Meds:  chlorhexidine  15 mL Mouth Rinse BID   Chlorhexidine Gluconate Cloth  6 each Topical Daily   folic acid  1 mg Per Tube Daily   guaiFENesin  15 mL Per Tube QID   insulin aspart  0-20 Units Subcutaneous TID PC & HS   insulin glargine-yfgn  8 Units Subcutaneous QHS   ipratropium-albuterol  3 mL Nebulization Q6H   levothyroxine  75 mcg Per Tube Q0600   magnesium oxide  400 mg Per Tube BID   rosuvastatin  5 mg Per Tube Q2000   sertraline  25 mg Per Tube Daily   Continuous Infusions:   LOS: 1 day    Time spent: 32 minutes  Sharen Hones, MD [facility]   To contact the attending provider between 7A-7P or the covering provider during after hours 7P-7A, please log into the web site [email or web address] and access using universal [place] password for that web site. If you do not have the password, please call the hospital operator.  07/12/2021, 11:24 AM

## 2021-07-12 NOTE — Consult Note (Addendum)
NAME:  Connor Aguilar, MRN:  [id number], DOB:  [birth year], LOS: 1 ADMISSION DATE:  07/11/2021, CONSULTATION DATE:  07/12/2021 REFERRING MD: Sharen Hones, MD CHIEF COMPLAINT: Shortness of breath   HPI  80 year old male with PMHx tension, type 2 diabetes mellitus, malnutrition, metastatic laryngeal squamous cell carcinoma s/p laryngectomy/XRT/chemotherapy with tracheostomy and PEG who presented to the ED from [facility] with chief complaints of acute onset hypoxemia.  Patient was recently hospitalized for pneumonia [facility] on 10/30 and had just completed antibiotics on 12/2 for aspiration pneumonia.  Per staff at [facility], patient's pulse ox was reading 90% on 8 L trach collar requiring multiple suctioning to keep sats greater than 90%.  He was noted to be more short of breath with increased work of breathing therefore brought to the ED for further evaluation  ED Course: On arrival to the ED, he was afebrile (!) 96.8 F (36 C)with blood pressure 104/76 mm Hg and pulse rate (!) 122 beats/min, RR(!) 27 Pertinent Labs in Red/Diagnostics Findings: Na+/ K+: 131/4.5 Glucose: 328   WBC/ TMAX: 10.3/ afebrile Hgb/Hct: 15.5/44.6 PCT: negative <0.10 Lactic acid: 3.5>3.1 COVID PCR: Negative   Troponin: 20 BNP: 176 CT: Findings with large right and moderate left pleural effusion with compressive atelectasis of the lungs bilaterally with debris's within the proximal esophagus and within the trachea concerning for reflux and aspiration. Patient was admitted to stepdown unit under hospitalist service with ENT consult.  Hospital Course: Patient was evaluated by ENT who performed tracheal debridement with tracheostomy through existing tracheostoma. Patient was noted with extensive effusions of lungs and per radiology right lung airway impaction as well with recommendations for pulmonary evaluation.  PCCM consulted to assist with management.  Past Medical History   Recurrent pneumonias    Hypothyroidism 01/23/2021  Mood disorder ([place]) 01/15/2021  Diarrhea 12/22/2020  G tube feedings ([place]) 12/22/2020  Generalized weakness 12/22/2020  Hyponatremia 12/22/2020  S/P laryngectomy 12/22/2020  Moderate protein-calorie malnutrition ([place]) 11/26/2020  Hypokalemia 10/09/2020  Metastatic squamous cell carcinoma to head and neck ([place]) 08/22/2020  Goals of care, counseling/discussion 08/19/2020  Laryngeal cancer ([place]) 08/19/2020  Chronic arthropathy 10/25/2019  Onychomycosis of multiple toenails with type 2 diabetes mellitus ([place]) 05/16/2018  Erectile dysfunction 08/19/2015  Type 2 diabetes mellitus without complication ([place]) 20/25/4270  Aspiration pneumonia ([place]) 07/27/2015  Tobacco abuse 04/09/2015  Hyperlipidemia 04/02/2015  Hypertension 04/02/2015  Panlobular emphysema ([place]) 04/02/2015     Significant Hospital Events   12/3: Admitted to stepdown unit with acute on chronic respiratory failure with hypoxemia secondary to mucous plug and bilateral pleural effusion 12/4:s/p bedside tracheal debridement with tracheoscopy through existing tracheostomy.  PCCM consulted  Consults:  ENT PCCM  Procedures:  12/4: Status post bedside tracheal debridement with tracheoscopy through existing tracheostoma  Significant Diagnostic Tests:  12/4: CTA Chest>Interval neck dissection, tracheostomy, and fistula creation between the cervical esophagus and trachea at the tracheostomy possibly reflecting spit fistula formation. Debris within the proximal esophagus and within the a trachea just below the fistula plants may reflect changes of reflux and aspiration.   Large right and moderate left pleural effusions with compressive atelectasis of the lungs bilaterally, right greater than left. Superimposed extensive airway impaction predominantly within the right middle and lower lobes.   Asymmetric focal thickening of the mid esophagus. While this may relate to focal secretions, a  superimposed mass is difficult to exclude and is not well assessed on this examination. This would be better assessed with dedicated endoscopy once the patient's acute issues  have resolved, if indicated.  Micro Data:  12/4: SARS-CoV-2 PCR> negative 12/4: Influenza PCR> negative 12/4: Blood culture x2> 12/4: Urine Culture> 12/4: MRSA PCR>>  12/4: Strep pneumo urinary antigen> Negative 12/4: Legionella urinary antigen>  Antimicrobials:  None  OBJECTIVE  Blood pressure 95/72, pulse 92, temperature 98.6 F (37 C), temperature source Axillary, resp. rate 20, height _0  (1.727 m), weight 56.7 kg, SpO2 98 %.    FiO2 (%):  [28 %-60 %] 28 %   Intake/Output Summary (Last 24 hours) at 07/12/2021 1707 Last data filed at 07/12/2021 1500 Gross per 24 hour  Intake 3616.61 ml  Output 840 ml  Net 2776.61 ml   Filed Weights   07/11/21 1736  Weight: 56.7 kg     Physical Examination  GENERAL: year-old critically ill patient lying in the bed with tracheostomy mask in place.  He is somnolent but easily arousable EYES: Pupils equal, round, reactive to light and accommodation. No scleral icterus. Extraocular muscles intact.  HEENT: Head atraumatic, normocephalic. Oropharynx and nasopharynx clear.  NECK:  Supple, no jugular venous distention. No thyroid enlargement, no tenderness.  LUNGS: Decreased breath sounds bilaterally, no wheezing, rales,rhonchi or crepitation. No use of accessory muscles of respiration.  CARDIOVASCULAR: S1, S2 normal. No murmurs, rubs, or gallops.  ABDOMEN: Soft, nontender, nondistended. Bowel sounds present. No organomegaly or mass.  G-tube in place EXTREMITIES: No pedal edema, cyanosis, or clubbing.  NEUROLOGIC: Cranial nerves II through XII are intact.  Muscle strength not assessed sensation intact. Gait not checked.  PSYCHIATRIC: The patient is trached and altered SKIN: No obvious rash, lesion, or ulcer.   Labs/imaging that I havepersonally reviewed  (right click  and "Reselect all SmartList Selections" daily)     Labs   CBC: Recent Labs  Lab 07/11/21 1731 07/11/21 2023 07/12/21 1309  WBC 10.3 9.0 8.1  NEUTROABS 9.3*  --   --   HGB 14.5 13.2 12.3*  HCT 44.6 40.1 37.6*  MCV 93.3 92.2 91.9  PLT 374 332 287    Basic Metabolic Panel: Recent Labs  Lab 07/11/21 1731 07/11/21 2023 07/12/21 1309  NA 131*  --  136  K 4.5  --  3.6  CL 96*  --  106  CO2 24  --  24  GLUCOSE 328*  --  91  BUN 32*  --  22  CREATININE 0.78 0.75 0.61  CALCIUM 8.8*  --  7.9*   GFR: Estimated Creatinine Clearance: 57.1 mL/min (by C-G formula based on SCr of 0.61 mg/dL). Recent Labs  Lab 07/11/21 1731 07/11/21 2023 07/11/21 2340 07/12/21 0424 07/12/21 1309  PROCALCITON  --  <0.10  --   --   --   WBC 10.3 9.0  --   --  8.1  LATICACIDVEN 3.5* 3.1* 3.7* 1.7 1.8    Liver Function Tests: Recent Labs  Lab 07/11/21 1731  AST 22  ALT 18  ALKPHOS 69  BILITOT 0.8  PROT 6.6  ALBUMIN 2.9*   No results for input(s): LIPASE, AMYLASE in the last 168 hours. No results for input(s): AMMONIA in the last 168 hours.  ABG No results found for: PHART, PCO2ART, PO2ART, HCO3, TCO2, ACIDBASEDEF, O2SAT   Coagulation Profile: Recent Labs  Lab 07/11/21 1731  INR 1.1    Cardiac Enzymes: No results for input(s): CKTOTAL, CKMB, CKMBINDEX, TROPONINI in the last 168 hours.  HbA1C: Hgb A1c MFr Bld  Date/Time Value Ref Range Status  03/17/2021 04:07 PM 6.0 (H) <5.7 % of total Hgb Final  Comment:    For someone without known diabetes, a hemoglobin  A1c value between 5.7% and 6.4% is consistent with prediabetes and should be confirmed with a  follow-up test. . For someone with known diabetes, a value <7% indicates that their diabetes is well controlled. A1c targets should be individualized based on duration of diabetes, age, comorbid conditions, and other considerations. . This assay result is consistent with an increased risk of diabetes. . Currently,  no consensus exists regarding use of hemoglobin A1c for diagnosis of diabetes for children. Marland Kitchen   11/26/2020 11:46 AM 6.3 (H) <5.7 % of total Hgb Final    Comment:    For someone without known diabetes, a hemoglobin  A1c value between 5.7% and 6.4% is consistent with prediabetes and should be confirmed with a  follow-up test. . For someone with known diabetes, a value <7% indicates that their diabetes is well controlled. A1c targets should be individualized based on duration of diabetes, age, comorbid conditions, and other considerations. . This assay result is consistent with an increased risk of diabetes. . Currently, no consensus exists regarding use of hemoglobin A1c for diagnosis of diabetes for children. .     CBG: Recent Labs  Lab 07/11/21 2207 07/12/21 0523 07/12/21 0758 07/12/21 1153 07/12/21 1604  GLUCAP 220* 112* 80 89 91    Review of Systems:   Unable to obtain due to patient on track and altered  Past Medical History  He,  has a past medical history of Allergy, Diabetes mellitus without complication ([place]), Elevated CPK (11/18/2015), Hyperlipidemia, Hypertension, and Tobacco abuse.   Surgical History    Past Surgical History:  Procedure Laterality Date   FOOT SURGERY       Social History   reports that he quit smoking about 11 months ago. His smoking use included cigarettes. He started smoking about 64 years ago. He has a 96.00 pack-year smoking history. He has never used smokeless tobacco. He reports that he does not drink alcohol and does not use drugs.   Family History   His family history includes Diabetes in his mother.   Allergies No Known Allergies   Home Medications  Prior to Admission medications   Medication Sig Start Date End Date Taking? Authorizing Provider  chlorhexidine (PERIDEX) 0.12 % solution 15 mLs by Mouth Rinse route 2 (two) times daily. 06/17/21  Yes [provider]  folic acid (FOLVITE) 1 MG tablet Give 1 tablet (1  mg total) by G-tube route in the morning. 01/16/21 01/16/22 Yes [provider]  HUMALOG 100 UNIT/ML injection Inject 0-12 Units into the skin 2 (two) times daily. 06/30/21  Yes [provider]  insulin glargine-yfgn (SEMGLEE) 100 UNIT/ML Pen Inject 8 Units into the skin at bedtime. 06/30/21  Yes [provider]  levofloxacin (LEVAQUIN) 750 MG tablet Take 750 mg by mouth daily. 07/03/21  Yes [provider]  levothyroxine (SYNTHROID) 75 MCG tablet Take 1 tablet (75 mcg total) by mouth daily. 03/18/21  Yes Delsa Grana, PA-C  magnesium oxide (MAG-OX) 400 MG tablet Place 1 tablet (400 mg total) into feeding tube daily. Patient taking differently: Place 400 mg into feeding tube 2 (two) times daily. 03/18/21  Yes Delsa Grana, PA-C  metroNIDAZOLE (FLAGYL) 500 MG tablet Take 500 mg by mouth 3 (three) times daily.   Yes [provider]  rosuvastatin (CRESTOR) 5 MG tablet TAKE ONE TABLET EVERY DAY AT BEDTIME 12/01/20  Yes Delsa Grana, PA-C  sertraline (ZOLOFT) 25 MG tablet Take 25  mg by mouth daily. 02/06/21  Yes [provider]  sodium chloride HYPERTONIC 3 % nebulizer solution Take 4 mLs by nebulization every 8 (eight) hours. 06/18/21  Yes [provider]  acetaminophen (TYLENOL) 325 MG tablet Take 2 tablets (650 mg total) by mouth every 6 (six) hours as needed for mild pain (or Fever >/= 101). 07/30/15   Nicholes Mango, MD  blood glucose meter kit and supplies Accuchek or whatever is covered by insurance; check sugars once a day only if desired; Dx E11.65, LON 99 months 12/13/17   Lada, Satira Anis, MD  gabapentin (NEURONTIN) 100 MG capsule Take 100 mg by mouth 3 (three) times daily. Patient not taking: Reported on 07/11/2021 10/28/20 10/28/21  [provider]  glucose blood test strip Use as instructed to check sugars once a day if desired; LON 99 months, Dx E11.65 12/13/17   Arnetha Courser, MD  ipratropium-albuterol (DUONEB) 0.5-2.5 (3) MG/3ML  SOLN Take 3 mLs by nebulization every 6 (six) hours as needed (wheeze and cough (can do separate from saline)). 05/18/21   Delsa Grana, PA-C  LORazepam (ATIVAN) 0.5 MG tablet Take 1 tablet (0.5 mg total) by mouth every 8 (eight) hours as needed for anxiety (N/V). Patient not taking: Reported on 07/11/2021 03/18/21   Delsa Grana, PA-C  nystatin (MYCOSTATIN) 100000 UNIT/ML suspension  04/14/21   [provider]  Oxycodone HCl 10 MG TABS Take 10 mg by mouth every 4 (four) hours as needed. Patient not taking: Reported on 07/11/2021 11/11/20   [provider]  polyethylene glycol powder (GLYCOLAX/MIRALAX) 17 GM/SCOOP powder Take 17 g by mouth daily as needed.    [provider]  promethazine-dextromethorphan (PROMETHAZINE-DM) 6.25-15 MG/5ML syrup Take 2.5-5 mLs by mouth 3 (three) times daily as needed for cough. Patient not taking: Reported on 07/11/2021 05/18/21   Delsa Grana, PA-C    Scheduled Meds:  acetylcysteine  2 mL Nebulization TID   chlorhexidine  15 mL Mouth Rinse BID   Chlorhexidine Gluconate Cloth  6 each Topical Daily   folic acid  1 mg Per Tube Daily   guaiFENesin  15 mL Per Tube QID   insulin aspart  0-20 Units Subcutaneous TID PC & HS   insulin glargine-yfgn  8 Units Subcutaneous QHS   ipratropium-albuterol  3 mL Nebulization Q6H   levothyroxine  75 mcg Per Tube Q0600   magnesium oxide  400 mg Per Tube BID   rosuvastatin  5 mg Per Tube Q2000   sertraline  25 mg Per Tube Daily   Continuous Infusions: PRN Meds:.acetaminophen **OR** acetaminophen, magnesium hydroxide, ondansetron **OR** ondansetron (ZOFRAN) IV, traZODone  Assessment & Plan:  Acute on Chronic hypoxic Respiratory Failure secondary to recurrent pneumonia, bilateral pleural effusion and mucous plug s/p Trach CT with increased bilateral pleural effusion and consolidation right greater than left suggestive of pneumonia versus aspiration PMHx: MDR enterobacter and MRSA PNA S/P tracheal  debridement with tracheoscopy through existing tracheostoma -Continue trach mask w/ FiO2 28% on 5L flow rate, wean as tolerated -Mucomyst TID, duonebs q6h PRN, airway clearance per RT -Send pleural fluid for LDH,Protein, cholesterol, glucose, cell ct w/ diff, cytology, Gm stain and culture, serum LDH, Protein, and cholesterol -consider additional pleural evaluation for AFB, ACE-level, ANA, BUN, AMYlase, triglyceride, fungal culture.  -Follow up CXR -Aspiration precautions -Plan for thoracentesis per IR -ENT input appreciated  Diabetes mellitus -CBGs Q4 hrs -Sliding scale insulin -Continue basal insulin -Follow ICU hyper/hypoglycemia protocol -Hold home meds   Chronic hyponatremia Sodium currently  stable 136 Patient with hyponatremia to low 130s on admission suspect element of poor solute intake versus SIADH -Follow BMP with electrolyte replacement  Metastatic laryngeal squamous cell cancer s/p laryngectomy/XRT/chemotherapy with tracheostomy and PEG   Pt is tube-feed dependent. Previously on Nutren 1.5 at goal rate 85 mL/hr cycled over 14 hrs overnight.  -NPO except tube feeds/meds -Dietitian consult for tube feed rec  Hypothyroidism TSH on 10/30 elevated at 10.4 with a low normal T4 0.96 Thyroid 75 mcg every morning  Best practice:  Diet:  Tube Feed  Pain/Anxiety/Delirium protocol (if indicated): No VAP protocol (if indicated): Not indicated DVT prophylaxis: LMWH GI prophylaxis: PPI Glucose control:  SSI Yes Central venous access:  N/A Arterial line:  N/A Foley:  Yes, and it is still needed Mobility:  bed rest  PT consulted: Yes Last date of multidisciplinary goals of care discussion [12/4] Code Status:  full code Disposition: ICU   = Goals of Care = Code Status Order: _0 @   Primary Emergency Contact: Ty[place], Home Phone: [phone] Wishes to pursue full aggressive treatment and intervention options, including CPR and intubation, but goals of care will be  addressed on going with family if that should become necessary.   Critical care time: 45 minutes     Rufina Falco, DNP, CCRN, FNP-C, AGACNP-BC Acute Care Nurse Practitioner   Pulmonary & Critical Care Medicine Pager: [phone] [place] at [facility]  .

## 2021-07-12 NOTE — Progress Notes (Signed)
OT Cancellation Note  Patient Details Name: Connor Aguilar MRN: [id number] DOB: 05-05-1941   Cancelled Treatment:    Reason Eval/Treat Not Completed: Patient not medically ready. Order received and chart reviewed. Per chart review, pt with recent episode of a-fib. Hospitalist contacted and recommending to hold OT evaluation today. Will re-attempt tomorrow if pt is medically appropriate. Thank you.  Fredirick Maudlin, OTR/L [place]

## 2021-07-12 NOTE — Progress Notes (Signed)
PT Cancellation Note  Patient Details Name: Connor Aguilar MRN: [id number] DOB: 1940-10-23   Cancelled Treatment:    Reason Eval/Treat Not Completed: Patient not medically ready PT orders received, chart reviewed. Per chart, pt with recent episode of a-fib with hospitalist recommending to hold PT evaluation today. Will f/u tomorrow if pt is medically appropriate.  Lavone Nian, PT, DPT 07/12/21, 2:04 PM    [name] 07/12/2021, 2:03 PM

## 2021-07-13 ENCOUNTER — Inpatient Hospital Stay: Payer: Medicare HMO

## 2021-07-13 DIAGNOSIS — J9621 Acute and chronic respiratory failure with hypoxia: Secondary | ICD-10-CM | POA: Diagnosis not present

## 2021-07-13 DIAGNOSIS — C7989 Secondary malignant neoplasm of other specified sites: Secondary | ICD-10-CM

## 2021-07-13 DIAGNOSIS — E43 Unspecified severe protein-calorie malnutrition: Secondary | ICD-10-CM | POA: Insufficient documentation

## 2021-07-13 DIAGNOSIS — E871 Hypo-osmolality and hyponatremia: Secondary | ICD-10-CM | POA: Diagnosis not present

## 2021-07-13 DIAGNOSIS — J90 Pleural effusion, not elsewhere classified: Secondary | ICD-10-CM | POA: Diagnosis not present

## 2021-07-13 DIAGNOSIS — T17998A Other foreign object in respiratory tract, part unspecified causing other injury, initial encounter: Secondary | ICD-10-CM | POA: Diagnosis not present

## 2021-07-13 LAB — GLUCOSE, CAPILLARY
Glucose-Capillary: 69 mg/dL — ABNORMAL LOW (ref 70–99)
Glucose-Capillary: 218 mg/dL — ABNORMAL HIGH (ref 70–99)
Glucose-Capillary: 313 mg/dL — ABNORMAL HIGH (ref 70–99)
Glucose-Capillary: 176 mg/dL — ABNORMAL HIGH (ref 70–99)
Glucose-Capillary: 121 mg/dL — ABNORMAL HIGH (ref 70–99)
Glucose-Capillary: 178 mg/dL — ABNORMAL HIGH (ref 70–99)
Glucose-Capillary: 194 mg/dL — ABNORMAL HIGH (ref 70–99)

## 2021-07-13 LAB — CBC
HCT: 36.9 % — ABNORMAL LOW (ref 39.0–52.0)
Hemoglobin: 12.1 g/dL — ABNORMAL LOW (ref 13.0–17.0)
MCH: 30.1 pg (ref 26.0–34.0)
MCHC: 32.8 g/dL (ref 30.0–36.0)
MCV: 91.8 fL (ref 80.0–100.0)
Platelets: 275 10*3/uL (ref 150–400)
RBC: 4.02 MIL/uL — ABNORMAL LOW (ref 4.22–5.81)
RDW: 14.7 % (ref 11.5–15.5)
WBC: 7.1 10*3/uL (ref 4.0–10.5)
nRBC: 0 % (ref 0.0–0.2)

## 2021-07-13 LAB — MAGNESIUM: Magnesium: 2 mg/dL (ref 1.7–2.4)

## 2021-07-13 LAB — HEMOGLOBIN A1C
Hgb A1c MFr Bld: 9 % — ABNORMAL HIGH (ref 4.8–5.6)
Mean Plasma Glucose: 212 mg/dL

## 2021-07-13 LAB — BASIC METABOLIC PANEL
Anion gap: 7 (ref 5–15)
BUN: 20 mg/dL (ref 8–23)
CO2: 24 mmol/L (ref 22–32)
Calcium: 8.1 mg/dL — ABNORMAL LOW (ref 8.9–10.3)
Chloride: 104 mmol/L (ref 98–111)
Creatinine, Ser: 0.59 mg/dL — ABNORMAL LOW (ref 0.61–1.24)
GFR, Estimated: >60 mL/min (ref >60)
Glucose, Bld: 288 mg/dL — ABNORMAL HIGH (ref 70–99)
Potassium: 3.7 mmol/L (ref 3.5–5.1)
Sodium: 135 mmol/L (ref 135–145)

## 2021-07-13 LAB — LACTATE DEHYDROGENASE, PLEURAL OR PERITONEAL FLUID: LD, Fluid: 138 U/L — ABNORMAL HIGH (ref 3–23)

## 2021-07-13 LAB — PHOSPHORUS: Phosphorus: 2.8 mg/dL (ref 2.5–4.6)

## 2021-07-13 LAB — URINE CULTURE: Culture: NO GROWTH

## 2021-07-13 LAB — BODY FLUID CELL COUNT WITH DIFFERENTIAL
Eos, Fluid: 0 %
Lymphs, Fluid: 30 %
Monocyte-Macrophage-Serous Fluid: 32 % — ABNORMAL LOW (ref 50–90)
Neutrophil Count, Fluid: 38 % — ABNORMAL HIGH (ref 0–25)
Total Nucleated Cell Count, Fluid: 53 cu mm (ref 0–1000)

## 2021-07-13 LAB — PROTEIN, PLEURAL OR PERITONEAL FLUID: Total protein, fluid: 3.6 g/dL

## 2021-07-13 LAB — GLUCOSE, PLEURAL OR PERITONEAL FLUID: Glucose, Fluid: 224 mg/dL

## 2021-07-13 MED ORDER — INSULIN GLARGINE-YFGN 100 UNIT/ML ~~LOC~~ SOLN
8.0000 [IU] | Freq: Every day | SUBCUTANEOUS | Status: DC
Start: 2021-07-13 — End: 2021-07-14
  Administered 2021-07-13: 8 [IU] via SUBCUTANEOUS
  Filled 2021-07-13 (×2): qty 0.08

## 2021-07-13 MED ORDER — NYSTATIN 100000 UNIT/ML MT SUSP
5.0000 mL | Freq: Four times a day (QID) | OROMUCOSAL | Status: DC
  Administered 2021-07-13 – 2021-07-15 (×7): 500000 [IU] via ORAL
  Filled 2021-07-13 (×14): qty 5

## 2021-07-13 NOTE — Progress Notes (Signed)
Nutrition Follow-up  DOCUMENTATION CODES:   Not applicable  INTERVENTION:   -Continue TF via PEG:  Osmolite 1.5 @ 55 ml/hr  45 ml Prosource TF daily  Free water flushes 30m q4 hours to maintain tube patency    Regimen provides 2020kcal/day, 94g/day protein and 11810mday free water   NUTRITION DIAGNOSIS:   Inadequate oral intake related to dysphagia (head and neck cancer) as evidenced by NPO status (pt with chronic G-tube).  Ongoing  GOAL:   Patient will meet greater than or equal to 90% of their needs  Met with TF  MONITOR:   Labs, Weight trends, TF tolerance, Skin, I & O's  REASON FOR ASSESSMENT:   Consult Enteral/tube feeding initiation and management  ASSESSMENT:   [age].o. male with medical history significant for metastatic laryngeal cancer status post laryngectomy(March 2022)/XRT/chemotherapy with tracheostomy and IR G-tube placement (March 2022), hypertension, type 2 diabetes mellitus and dyslipidemia who is admitted with HCAP and sepsis.  -11/27- g-tube replaced  Reviewed I/O's: +441 ml x 24 hours and +3.1 L since admission  UOP: 1 L x 24 hours  Pt currently on trach collar. Pt able to communicate with head nods and reports feeling better today. History provided by pt's son at bedside. Pt lost "a lot of weight" prior to initial surgery back in January 2022. Post-operatively, pt was maintaining a wt of 120#, however, son suspects that has has lost about 6 pounds over the past few months. Reviewed CareEverywhere; noted wt of 114# on 04/08/21.   Pt son unsure of TF regimen PTA, pt reports it was infusing for 14 hours per day (per chart review, previously on nocturnal feeds of Nutren 1.5 @ 85 ml/hr x 14 hours). Son shares that pt was able to eat some purees and thin liquids for comfort, but swallow function was gotten progressively worse over the past few months and progressed to the point where pt is solely dependent on TF for nutrition. Son shares that even  broth "will come right back up" if pt tries to consume it. Per son, there may be potential for esophageal dilation for swallow study in the future. RD discussed rationale for continuous feeds while NPO; son is in agreement with plan.   Son also reports concern with pt's goals of care. He shares that he felt pressured to move towards comfort care PTA but feels like pt should be given a chance and that pt "still has fight left in him". RD offered possible palliative care consult for goals of care discussions- son was very appreciative of recommendation and would like to pursue. Case discussed with MD, who will order.   Medications reviewed and include mangesium oxide and folic acid.   Labs reviewed: CBGS: 91-313 (inpatient orders for glycemic control are 0-20 units insulin aspart TID after meals and at bedtime and 8 units insulin glargine-yfgn daily).    NUTRITION - FOCUSED PHYSICAL EXAM:  Flowsheet Row Most Recent Value  Orbital Region Severe depletion  Upper Arm Region Moderate depletion  Thoracic and Lumbar Region Severe depletion  Buccal Region Severe depletion  Temple Region Severe depletion  Clavicle Bone Region Severe depletion  Clavicle and Acromion Bone Region Severe depletion  Scapular Bone Region Severe depletion  Dorsal Hand Moderate depletion  Patellar Region Severe depletion  Anterior Thigh Region Severe depletion  Posterior Calf Region Severe depletion  Edema (RD Assessment) None  Hair Reviewed  Eyes Reviewed  Mouth Reviewed  Skin Reviewed  Nails Reviewed  Diet Order:   Diet Order             Diet NPO time specified  Diet effective now                   EDUCATION NEEDS:   No education needs have been identified at this time  Skin:  Skin Assessment: Reviewed RN Assessment  Last BM:  07/13/21  Height:   Ht Readings from Last 1 Encounters:  07/11/21 _0  (1.727 m)    Weight:   Wt Readings from Last 1 Encounters:  07/11/21 56.7 kg     Ideal Body Weight:  70 kg  BMI:  Body mass index is 19.01 kg/m.  Estimated Nutritional Needs:   Kcal:  1700-2000kcal/day  Protein:  85-100g/day  Fluid:  1.4-1.7L/day    Loistine Chance, RD, LDN, [place] Registered Dietitian II Certified Diabetes Care and Education Specialist Please refer to AMION for RD and/or RD on-call/weekend/after hours pager

## 2021-07-13 NOTE — Progress Notes (Signed)
NAME:  Connor Aguilar, MRN:  [id number], DOB:  1941/05/17, LOS: 2 ADMISSION DATE:  07/11/2021, CONSULTATION DATE:  07/12/2021 REFERRING MD: Sharen Hones, MD CHIEF COMPLAINT: Shortness of breath   HPI  80 year old male with PMHx tension, type 2 diabetes mellitus, malnutrition, metastatic laryngeal squamous cell carcinoma s/p laryngectomy/XRT/chemotherapy with tracheostomy and PEG who presented to the ED from [facility] with chief complaints of acute onset hypoxemia.  Patient was recently hospitalized for pneumonia [facility] on 10/30 and had just completed antibiotics on 12/2 for aspiration pneumonia.  Per staff at [facility], patient's pulse ox was reading 90% on 8 L trach collar requiring multiple suctioning to keep sats greater than 90%.  He was noted to be more short of breath with increased work of breathing therefore brought to the ED for further evaluation  Hospital Course: Patient was evaluated by ENT who performed tracheal debridement with tracheostomy through existing tracheostoma. Patient was noted with extensive effusions of lungs and per radiology right lung airway impaction as well with recommendations for pulmonary evaluation.  PCCM consulted to assist with management.  Past Medical History   Recurrent pneumonias   Hypothyroidism 01/23/2021  Mood disorder ([place]) 01/15/2021  Diarrhea 12/22/2020  G tube feedings ([place]) 12/22/2020  Generalized weakness 12/22/2020  Hyponatremia 12/22/2020  S/P laryngectomy 12/22/2020  Moderate protein-calorie malnutrition ([place]) 11/26/2020  Hypokalemia 10/09/2020  Metastatic squamous cell carcinoma to head and neck ([place]) 08/22/2020  Goals of care, counseling/discussion 08/19/2020  Laryngeal cancer ([place]) 08/19/2020  Chronic arthropathy 10/25/2019  Onychomycosis of multiple toenails with type 2 diabetes mellitus ([place]) 05/16/2018  Erectile dysfunction 08/19/2015  Type 2 diabetes mellitus without complication ([place]) 68/61/6837  Aspiration  pneumonia ([place]) 07/27/2015  Tobacco abuse 04/09/2015  Hyperlipidemia 04/02/2015  Hypertension 04/02/2015  Panlobular emphysema ([place]) 04/02/2015     Significant Hospital Events   12/3: Admitted to stepdown unit with acute on chronic respiratory failure with hypoxemia secondary to mucous plug and bilateral pleural effusion 12/4:s/p bedside tracheal debridement with tracheoscopy through existing tracheostomy.  PCCM consulted  Procedures:  12/4: Status post bedside tracheal debridement with tracheoscopy through existing tracheostoma  Subjective/interval history:  No overnight issues, patient remains on trach collar Seen by ENT, recommended outpatient follow-up with [facility]  OBJECTIVE  Blood pressure 126/75, pulse 96, temperature 98 F (36.7 C), temperature source Axillary, resp. rate (!) 26, height 5\' 8"  (1.727 m), weight 56.7 kg, SpO2 99 %.    FiO2 (%):  [28 %-35 %] 28 %   Intake/Output Summary (Last 24 hours) at 07/13/2021 2902 Last data filed at 07/13/2021 0900 Gross per 24 hour  Intake 1632.25 ml  Output 740 ml  Net 892.25 ml   Filed Weights   07/11/21 1736  Weight: 56.7 kg     Physical Examination  Physical exam: General: Acute on chronically ill-appearing male, lying on the bed HEENT: [place]/AT, eyes anicteric.  moist mucus membranes.  S/p trach Neuro: Alert, awake following commands Chest: Coarse breath sounds, no wheezes or rhonchi Heart: Regular rate and rhythm, no murmurs or gallops Abdomen: Soft, nontender, nondistended, bowel sounds present Skin: No rash  Assessment & Plan:  Acute on Chronic hypoxic Respiratory Failure secondary to mucous plug Bilateral pleural effusion S/P tracheal debridement with tracheoscopy through existing tracheostoma Continue trach collar as tolerated Continue Mucomyst TID, duonebs q6h PRN, airway clearance per RT Patient is scheduled for thoracentesis by IR, called by primary team Same diagnostic studies Aspiration precautions  Diabetes  mellitus, with hyperglycemia Monitor fingerstick with goal 140-180 Continue basal  and bolus insulin   Chronic hyponatremia Sodium currently stable 135 Follow BMP with electrolyte replacement  Metastatic laryngeal squamous cell cancer s/p laryngectomy/XRT/chemotherapy with tracheostomy and PEG   Pt is tube-feed dependent. Previously on Nutren 1.5 at goal rate 85 mL/hr cycled over 14 hrs overnight.  NPO except tube feeds/meds Dietitian consult for tube feed rec  Hypothyroidism Continue Synthyroid  Best practice:  Per primary team   PCCM will sign off, please call with questions    Jacky Kindle MD [facility] See Amion for pager If no response to pager, please call [phone] until 7pm After 7pm, Please call E-link [phone]   .

## 2021-07-13 NOTE — Procedures (Signed)
PROCEDURE SUMMARY:  Successful US guided right thoracentesis. Yielded 1 Liter of clear yellow fluid. Pt tolerated procedure well. No immediate complications.  Specimen was sent for labs. CXR ordered.  EBL < 5 mL  Hedy Jacob PA-C 07/13/2021 12:11 PM

## 2021-07-13 NOTE — NC FL2 (Signed)
[place] LEVEL OF CARE SCREENING TOOL     IDENTIFICATION  Patient Name: Connor Aguilar Birthdate: [birth year] Sex: male Admission Date (Current Location): 07/11/2021  [facility] and [place] Number:  Engineering geologist and Address:  [facility], [address] [place], [place] [address]      Provider Number: [id number]  Attending Physician Name and Address:  Sharen Hones, MD  Relative Name and Phone Number:  Elishua Radford (son) [phone]    Current Level of Care: Hospital Recommended Level of Care: [place] Prior Approval Number:    Date Approved/Denied:   PASRR Number: [id number]  Discharge Plan: SNF    Current Diagnoses: Patient Active Problem List   Diagnosis Date Noted   Protein-calorie malnutrition, severe 07/13/2021   Acute on chronic respiratory failure with hypoxia ([place]) 07/12/2021   Uncontrolled type 2 diabetes mellitus with hyperglycemia ([place]) 07/12/2021   Pleural effusion 07/12/2021   Mucus plug in respiratory tract 07/12/2021   HCAP (healthcare-associated pneumonia) 07/11/2021   Hypothyroidism 01/23/2021   Mood disorder ([place]) 01/15/2021   Diarrhea 12/22/2020   G tube feedings ([place]) 12/22/2020   Generalized weakness 12/22/2020   Hyponatremia 12/22/2020   S/P laryngectomy 12/22/2020   Moderate protein-calorie malnutrition ([place]) 11/26/2020   Hypokalemia 10/09/2020   Metastatic squamous cell carcinoma to head and neck ([place]) 08/22/2020   Goals of care, counseling/discussion 08/19/2020   Laryngeal cancer ([place]) 08/19/2020   Chronic arthropathy 10/25/2019   Onychomycosis of multiple toenails with type 2 diabetes mellitus ([place]) 05/16/2018   Erectile dysfunction 08/19/2015   Type 2 diabetes mellitus without complication ([place]) 62/83/6629   Aspiration pneumonia ([place]) 07/27/2015   Tobacco abuse 04/09/2015   Hyperlipidemia 04/02/2015   Hypertension 04/02/2015   Panlobular emphysema ([place]) 04/02/2015     Orientation RESPIRATION BLADDER Height & Weight     Self, Place  Other (Comment), O2 (Lyrengal stoma, Trach collar 5 L 28%) Incontinent, External catheter Weight: 56.7 kg Height:  5\' 8"  (172.7 cm)  BEHAVIORAL SYMPTOMS/MOOD NEUROLOGICAL BOWEL NUTRITION STATUS      Incontinent Feeding tube  AMBULATORY STATUS COMMUNICATION OF NEEDS Skin   Extensive Assist Verbally                         Personal Care Assistance Level of Assistance  Bathing, Feeding, Dressing Bathing Assistance: Maximum assistance Feeding assistance: Maximum assistance Dressing Assistance: Maximum assistance     Functional Limitations Info  Sight, Hearing, Speech Sight Info: Adequate Hearing Info: Impaired Speech Info: Impaired (lyrengal stoma)    SPECIAL CARE FACTORS FREQUENCY  PT (By licensed PT), OT (By licensed OT)     PT Frequency: 5 times per week OT Frequency: 5 times per week            Contractures Contractures Info: Not present    Additional Factors Info  Code Status, Allergies Code Status Info: Full Allergies Info: NKA           Current Medications (07/13/2021):  This is the current hospital active medication list Current Facility-Administered Medications  Medication Dose Route Frequency Provider Last Rate Last Admin   acetaminophen (TYLENOL) tablet 650 mg  650 mg Per Tube Q6H PRN Renda Rolls, RPH       Or   acetaminophen (TYLENOL) suppository 650 mg  650 mg Rectal Q6H PRN Renda Rolls, RPH       acetylcysteine (MUCOMYST) 20 % nebulizer / oral solution 2 mL  2 mL Nebulization TID  Sharen Hones, MD   4 mL at 07/13/21 1336   chlorhexidine (PERIDEX) 0.12 % solution 15 mL  15 mL Mouth Rinse BID Mansy, Jan A, MD   15 mL at 07/13/21 0902   Chlorhexidine Gluconate Cloth 2 % PADS 6 each  6 each Topical Daily Mansy, Jan A, MD   6 each at 07/13/21 0902   feeding supplement (OSMOLITE 1.5 CAL) liquid 1,000 mL  1,000 mL Per Tube Continuous Sharen Hones, MD 55 mL/hr at 07/13/21 1402  1,000 mL at 07/13/21 1402   feeding supplement (PROSource TF) liquid 45 mL  45 mL Per Tube Daily Sharen Hones, MD   45 mL at 83/66/29 4765   folic acid (FOLVITE) tablet 1 mg  1 mg Per Tube Daily Renda Rolls, RPH   1 mg at 07/13/21 0901   free water 30 mL  30 mL Per Tube Q4H Sharen Hones, MD   30 mL at 07/13/21 1525   guaiFENesin (ROBITUSSIN) 100 MG/5ML liquid 15 mL  15 mL Per Tube QID Renda Rolls, RPH   15 mL at 07/13/21 1402   insulin aspart (novoLOG) injection 0-20 Units  0-20 Units Subcutaneous TID PC & HS Mansy, Jan A, MD   4 Units at 07/13/21 1210   insulin glargine-yfgn [facility]) injection 8 Units  8 Units Subcutaneous Daily Sharen Hones, MD   8 Units at 07/13/21 1041   ipratropium-albuterol (DUONEB) 0.5-2.5 (3) MG/3ML nebulizer solution 3 mL  3 mL Nebulization Q6H Sharen Hones, MD   3 mL at 07/13/21 1336   levothyroxine (SYNTHROID) tablet 75 mcg  75 mcg Per Tube Q0600 Renda Rolls, RPH   75 mcg at 07/13/21 4650   magnesium hydroxide (MILK OF MAGNESIA) suspension 30 mL  30 mL Per Tube Daily PRN Renda Rolls, RPH       magnesium oxide (MAG-OX) tablet 400 mg  400 mg Per Tube BID Mansy, Jan A, MD   400 mg at 07/13/21 0902   nystatin (MYCOSTATIN) 100000 UNIT/ML suspension 500,000 Units  5 mL Oral QID Rust-Chester, Huel Cote, NP   500,000 Units at 07/13/21 1402   ondansetron (ZOFRAN) tablet 4 mg  4 mg Oral Q6H PRN Mansy, Jan A, MD       Or   ondansetron [facility]) injection 4 mg  4 mg Intravenous Q6H PRN Mansy, Jan A, MD       rosuvastatin (CRESTOR) tablet 5 mg  5 mg Per Tube Q2000 Renda Rolls, RPH   5 mg at 07/12/21 2009   sertraline (ZOLOFT) tablet 25 mg  25 mg Per Tube Daily Renda Rolls, RPH   25 mg at 07/13/21 0902   traZODone (DESYREL) tablet 25 mg  25 mg Per Tube QHS PRN Renda Rolls, [facility]         Discharge Medications: Please see discharge summary for a list of discharge medications.  Relevant Imaging Results:  Relevant Lab Results:   Additional  Information SS#  [id number]  Shelbie Hutching, RN

## 2021-07-13 NOTE — TOC Initial Note (Addendum)
Transition of Care [facility]) - Initial/Assessment Note    Patient Details  Name: Connor Aguilar MRN: [id number] Date of Birth: 12/16/1940  Transition of Care [facility]) CM/SW Contact:    Connor Hutching, RN Phone Number: 07/13/2021, 3:20 PM  Clinical Narrative:                 Patient admitted to the hospital with acute on chronic respiratory failure, patient has a laryngeal stoma and on trach collar 5 L 28%.  RNCM met with patient and patient's son, Connor Aguilar, at the bedside, explained role with discharge planning.   Patient has been at [facility] for about 3 weeks per son, patient was there for rehab but he reports PT stopped working with patient and family is paying private for LtC, he said the facility said that the patient needed palliative.  Patient was placed at [facility] after being admitted to [facility] for pneumonia.  Son would like to discuss the patient's prognosis and know where they stand, if he can get better and get therapy he wants that and eventually take the patient back home- if he can't get better then he will accept that but he needs to discuss with a doctor or palliative.  He agrees to a palliative consult.  Before going to [facility] patient was living at home with 24 hour care from family and he was able to get around and walk without assistance.    Family not sure if the want the patient to go back to [facility] or to a different facility.  Patient relies on tube feeds for nutrition.   TOC will follow.  Expected Discharge Plan: Skilled Nursing Facility Barriers to Discharge: Continued Medical Work up   Patient Goals and CMS Choice Patient states their goals for this hospitalization and ongoing recovery are:: Patient unable to state but son wants to know what the treatment options are, would like for him to be able to go home eventually CMS Medicare.gov Compare Post Acute Care list provided to:: Patient Represenative (must comment) Choice offered to / list presented to : Adult  Children  Expected Discharge Plan and Services Expected Discharge Plan: [place]   Discharge Planning Services: CM Consult Post Acute Care Choice: [place] Living arrangements for the past 2 months: [place], [place]                 DME Arranged: N/A DME Agency: NA       HH Arranged: NA HH Agency: NA        Prior Living Arrangements/Services Living arrangements for the past 2 months: [place], [place] Lives with:: Facility Resident Patient language and need for interpreter reviewed:: Yes Do you feel safe going back to the place where you live?: Yes      Need for Family Participation in Patient Care: Yes (Comment) Care giver support system in place?: Yes (comment) Current home services: DME (walker) Criminal Activity/Legal Involvement Pertinent to Current Situation/Hospitalization: No - Comment as needed  Activities of Daily Living Home Assistive Devices/Equipment: Other (Comment) ADL Screening (condition at time of admission) Patient's cognitive ability adequate to safely complete daily activities?: No Is the patient deaf or have difficulty hearing?: No Does the patient have difficulty seeing, even when wearing glasses/contacts?: No Does the patient have difficulty concentrating, remembering, or making decisions?: Yes Patient able to express need for assistance with ADLs?: No Does the patient have difficulty dressing or bathing?: Yes Independently  performs ADLs?: No Communication: Needs assistance Is this a change from baseline?: Pre-admission baseline Dressing (OT): Dependent Is this a change from baseline?: Pre-admission baseline Grooming: Dependent Is this a change from baseline?: Pre-admission baseline Feeding: Dependent Is this a change from baseline?: Pre-admission baseline Bathing: Dependent Is this a change from baseline?: Pre-admission baseline Toileting: Dependent Is this  a change from baseline?: Pre-admission baseline In/Out Bed: Dependent Is this a change from baseline?: Pre-admission baseline Walks in Home: Dependent Is this a change from baseline?: Pre-admission baseline Does the patient have difficulty walking or climbing stairs?: Yes Weakness of Legs: Both Weakness of Arms/Hands: Both  Permission Sought/Granted Permission sought to share information with : Case Manager, Family Supports, Customer service manager Permission granted to share information with : Yes, Verbal Permission Granted  Share Information with NAME: Connor Aguilar  Permission granted to share info w AGENCY: SNF's  Permission granted to share info w Relationship: son  Permission granted to share info w Contact Information: [phone]  Emotional Assessment Appearance:: Appears stated age Attitude/Demeanor/Rapport: Unable to Assess Affect (typically observed): Unable to Assess Orientation: : Oriented to Self Alcohol / Substance Use: Not Applicable Psych Involvement: No (comment)  Admission diagnosis:  SOB (shortness of breath) [R06.02] Pleural effusion [J90] Hypoxia [R09.02] HCAP (healthcare-associated pneumonia) [J18.9] Patient Active Problem List   Diagnosis Date Noted   Protein-calorie malnutrition, severe 07/13/2021   Acute on chronic respiratory failure with hypoxia ([place]) 07/12/2021   Uncontrolled type 2 diabetes mellitus with hyperglycemia (HCC) 07/12/2021   Pleural effusion 07/12/2021   Mucus plug in respiratory tract 07/12/2021   HCAP (healthcare-associated pneumonia) 07/11/2021   Hypothyroidism 01/23/2021   Mood disorder ([place]) 01/15/2021   Diarrhea 12/22/2020   G tube feedings ([place]) 12/22/2020   Generalized weakness 12/22/2020   Hyponatremia 12/22/2020   S/P laryngectomy 12/22/2020   Moderate protein-calorie malnutrition ([place]) 11/26/2020   Hypokalemia 10/09/2020   Metastatic squamous cell carcinoma to head and neck ([place]) 08/22/2020   Goals of care,  counseling/discussion 08/19/2020   Laryngeal cancer ([place]) 08/19/2020   Chronic arthropathy 10/25/2019   Onychomycosis of multiple toenails with type 2 diabetes mellitus ([place]) 05/16/2018   Erectile dysfunction 08/19/2015   Type 2 diabetes mellitus without complication ([place]) 76/18/4859   Aspiration pneumonia ([place]) 07/27/2015   Tobacco abuse 04/09/2015   Hyperlipidemia 04/02/2015   Hypertension 04/02/2015   Panlobular emphysema ([place]) 04/02/2015   PCP:  Delsa Grana, PA-C Pharmacy:   [facility] [place] [address] Phone: [phone] Fax: [phone]     Social Determinants of Health (SDOH) Interventions    Readmission Risk Interventions No flowsheet data found.

## 2021-07-13 NOTE — Evaluation (Addendum)
Occupational Therapy Evaluation Patient Details Name: Connor Aguilar MRN: [id number] DOB: 09-12-1940 Today's Date: 07/13/2021   History of Present Illness Pt is an 80 y/o M admitted on 07/11/21 with c/c of progressive SOB. Pt is being treated for acute on chronic respiratory failure with hypoxemia 2/2 mucous plug & large pleural effusion. 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 Gilliam was seen for OT/PT co-evaluation this date. 5- 6 weeks prior to hospital admission, pt  was living in a RV/camper located b/w 2 son's homes with 24/7 support from sons and an aide and required assistance for some ADLs/ IADLs. Currently pt demonstrates impairments as described below (See OT problem list) which functionally limit his ability to perform ADL/self-care tasks. Pt MOD A +2 sup<>sit. Pt  MIN A + VCs for grooming, seated, EOB ~65min.  Pt c/o dizziness while sitting EOB but question accuracy of BP reading of 144/123 mmHg (MAP 132) as pt was leaning strongly on RUE. Once semi fowler in bed BP 150/86 (MAP 107).  Pt would benefit from skilled OT services to address noted impairments and functional limitations (see below for any additional details) in order to maximize safety and independence while minimizing falls risk and caregiver burden. Upon hospital discharge, recommend STR to maximize pt safety and return to PLOF.       Recommendations for follow up therapy are one component of a multi-disciplinary discharge planning process, led by the attending physician.  Recommendations may be updated based on patient status, additional functional criteria and insurance authorization.   Follow Up Recommendations  Skilled nursing-short term rehab (<3 hours/day)    Assistance Recommended at Discharge Frequent or constant Supervision/Assistance  Functional Status Assessment  Patient has had a recent decline in their functional  status and demonstrates the ability to make significant improvements in function in a reasonable and predictable amount of time.  Equipment Recommendations  Other (comment) (defer to next venue of care)    Recommendations for Other Services Speech consult     Precautions / Restrictions Precautions Precautions: Fall Restrictions Weight Bearing Restrictions: No      Mobility Bed Mobility Overal bed mobility: Needs Assistance Bed Mobility: Supine to Sit;Sit to Supine     Supine to sit: Mod assist;HOB elevated;+2 for physical assistance Sit to supine: Mod assist;HOB elevated;+2 for physical assistance   General bed mobility comments:     Transfers                   General transfer comment: Not attempted      Balance Overall balance assessment: Needs assistance Sitting-balance support: Bilateral upper extremity supported;Feet unsupported Sitting balance-Leahy Scale: Poor                                     ADL either performed or assessed with clinical judgement   ADL Overall ADL's : Needs assistance/impaired                                       General ADL Comments: Pt MIN A  + VCs for grooming, seated, EOB, assistance needed for balance, VCs needed to intiate, continue task.     Vision         Perception  Praxis      Pertinent Vitals/Pain Pain Assessment: Faces Faces Pain Scale: No hurt     Hand Dominance     Extremity/Trunk Assessment Upper Extremity Assessment Upper Extremity Assessment: Generalized weakness   Lower Extremity Assessment Lower Extremity Assessment: Generalized weakness       Communication Communication Communication: Other (comment) (Pt with stoma, mouths words (son reports he was using vibrating device to communicate at home))   Cognition Arousal/Alertness: Awake/alert Behavior During Therapy: Flat affect Overall Cognitive Status: Impaired/Different from baseline Area of  Impairment: Orientation;Attention;Memory;Awareness;Safety/judgement;Problem solving;Following commands                 Orientation Level: Disoriented to;Time;Situation                   General Comments  Pt c/o dizziness while sitting EOB but question accuracy of BP reading of 144/123 mmHg (MAP 132) as pt was leaning strongly on RUE. BP once semi fowler in bed 150/86 (MAP 107)    Exercises Exercises: Other exercises Other Exercises Other Exercises: Pt educacted re: OT role, bed mobility, importance of movement for functional mobility Other Exercises: Sup<>sit, static sitting EOB ~ 10 min, seating grooming, facewashing   Shoulder Instructions      Home Living Family/patient expects to be discharged to:: Private residence Living Arrangements: Alone Available Help at Discharge: Family;Available 24 hours/day Type of Home: Other(Comment) (RV/camper) Home Access: Ramped entrance     Home Layout: One level     Bathroom Shower/Tub: Teacher, early years/pre: Standard                Prior Functioning/Environment Prior Level of Function : Needs assist       Physical Assist : ADLs (physical)   ADLs (physical): Bathing;Dressing;Feeding (sons and aide assisted as needed)            OT Problem List: Decreased strength;Decreased range of motion;Decreased activity tolerance;Impaired balance (sitting and/or standing);Decreased coordination;Decreased safety awareness;Decreased knowledge of use of DME or AE;Decreased cognition      OT Treatment/Interventions: Self-care/ADL training;Therapeutic exercise;Energy conservation;DME and/or AE instruction;Therapeutic activities    OT Goals(Current goals can be found in the care plan section) Acute Rehab OT Goals Patient Stated Goal: patient unable. Son wishes pt to regain strength to go home OT Goal Formulation: With family Time For Goal Achievement: 07/27/21 Potential to Achieve Goals: Good ADL Goals Pt Will  Perform Grooming: with supervision;sitting Pt Will Perform Lower Body Dressing: sit to/from stand;with mod assist Pt Will Transfer to Toilet: stand pivot transfer;with mod assist;bedside commode  OT Frequency: Min 2X/week   Barriers to D/C:            Co-evaluation PT/OT/SLP Co-Evaluation/Treatment: Yes Reason for Co-Treatment: Complexity of the patient's impairments (multi-system involvement);Necessary to address cognition/behavior during functional activity;For patient/therapist safety;To address functional/ADL transfers PT goals addressed during session: Mobility/safety with mobility;Balance OT goals addressed during session: ADL's and self-care      AM-PAC OT "6 Clicks" Daily Activity     Outcome Measure Help from another person eating meals?: A Lot Help from another person taking care of personal grooming?: A Lot Help from another person toileting, which includes using toliet, bedpan, or urinal?: A Lot Help from another person bathing (including washing, rinsing, drying)?: A Lot Help from another person to put on and taking off regular upper body clothing?: A Lot Help from another person to put on and taking off regular lower body clothing?: A Lot 6 Click Score: 12  End of Session    Activity Tolerance: Patient tolerated treatment well;Treatment limited secondary to medical complications (Comment);Other (comment) (pt very weak, c/o dizziness) Patient left: in bed;with call bell/phone within reach;with family/visitor present  OT Visit Diagnosis: Unsteadiness on feet (R26.81);Other abnormalities of gait and mobility (R26.89);Muscle weakness (generalized) (M62.81)                Time: 8288-3374 OT Time Calculation (min): 26 min Charges:  OT General Charges $OT Visit: 1 Visit OT Evaluation $OT Eval High Complexity: 1 High  Nino Glow, Markus Daft 07/13/2021, 4:30 PM

## 2021-07-13 NOTE — Progress Notes (Signed)
..07/13/2021 8:21 AM  Connor Aguilar [id number]    Temp:  [97.8 F (36.6 C)-98.6 F (37 C)] 98 F (36.7 C) (12/05 0747) Pulse Rate:  [39-111] 97 (12/05 0600) Resp:  [18-31] 21 (12/05 0600) BP: (72-138)/(55-98) 125/66 (12/05 0600) SpO2:  [89 %-100 %] 100 % (12/05 0600) FiO2 (%):  [28 %-35 %] 28 % (12/05 0400),     Intake/Output Summary (Last 24 hours) at 07/13/2021 0821 Last data filed at 07/13/2021 7654 Gross per 24 hour  Intake 1375.67 ml  Output 740 ml  Net 635.67 ml    Results for orders placed or performed during the hospital encounter of 07/11/21 (from the past 24 hour(s))  Glucose, capillary     Status: None   Collection Time: 07/12/21 11:53 AM  Result Value Ref Range   Glucose-Capillary 89 70 - 99 mg/dL  Basic metabolic panel     Status: Abnormal   Collection Time: 07/12/21  1:09 PM  Result Value Ref Range   Sodium 136 135 - 145 mmol/L   Potassium 3.6 3.5 - 5.1 mmol/L   Chloride 106 98 - 111 mmol/L   CO2 24 22 - 32 mmol/L   Glucose, Bld 91 70 - 99 mg/dL   BUN 22 8 - 23 mg/dL   Creatinine, Ser 0.61 0.61 - 1.24 mg/dL   Calcium 7.9 (L) 8.9 - 10.3 mg/dL   GFR, Estimated >60 >60 mL/min   Anion gap 6 5 - 15  CBC     Status: Abnormal   Collection Time: 07/12/21  1:09 PM  Result Value Ref Range   WBC 8.1 4.0 - 10.5 K/uL   RBC 4.09 (L) 4.22 - 5.81 MIL/uL   Hemoglobin 12.3 (L) 13.0 - 17.0 g/dL   HCT 37.6 (L) 39.0 - 52.0 %   MCV 91.9 80.0 - 100.0 fL   MCH 30.1 26.0 - 34.0 pg   MCHC 32.7 30.0 - 36.0 g/dL   RDW 14.6 11.5 - 15.5 %   Platelets 307 150 - 400 K/uL   nRBC 0.0 0.0 - 0.2 %  Lactic acid, plasma     Status: None   Collection Time: 07/12/21  1:09 PM  Result Value Ref Range   Lactic Acid, Venous 1.8 0.5 - 1.9 mmol/L  Glucose, capillary     Status: None   Collection Time: 07/12/21  4:04 PM  Result Value Ref Range   Glucose-Capillary 91 70 - 99 mg/dL  Glucose, capillary     Status: Abnormal   Collection Time: 07/12/21  7:58 PM  Result Value Ref Range    Glucose-Capillary 103 (H) 70 - 99 mg/dL  Glucose, capillary     Status: Abnormal   Collection Time: 07/13/21  3:24 AM  Result Value Ref Range   Glucose-Capillary 218 (H) 70 - 99 mg/dL  Basic metabolic panel     Status: Abnormal   Collection Time: 07/13/21  5:18 AM  Result Value Ref Range   Sodium 135 135 - 145 mmol/L   Potassium 3.7 3.5 - 5.1 mmol/L   Chloride 104 98 - 111 mmol/L   CO2 24 22 - 32 mmol/L   Glucose, Bld 288 (H) 70 - 99 mg/dL   BUN 20 8 - 23 mg/dL   Creatinine, Ser 0.59 (L) 0.61 - 1.24 mg/dL   Calcium 8.1 (L) 8.9 - 10.3 mg/dL   GFR, Estimated >60 >60 mL/min   Anion gap 7 5 - 15  Magnesium     Status: None   Collection Time: 07/13/21  5:18 AM  Result Value Ref Range   Magnesium 2.0 1.7 - 2.4 mg/dL  Phosphorus     Status: None   Collection Time: 07/13/21  5:18 AM  Result Value Ref Range   Phosphorus 2.8 2.5 - 4.6 mg/dL  CBC     Status: Abnormal   Collection Time: 07/13/21  5:18 AM  Result Value Ref Range   WBC 7.1 4.0 - 10.5 K/uL   RBC 4.02 (L) 4.22 - 5.81 MIL/uL   Hemoglobin 12.1 (L) 13.0 - 17.0 g/dL   HCT 36.9 (L) 39.0 - 52.0 %   MCV 91.8 80.0 - 100.0 fL   MCH 30.1 26.0 - 34.0 pg   MCHC 32.8 30.0 - 36.0 g/dL   RDW 14.7 11.5 - 15.5 %   Platelets 275 150 - 400 K/uL   nRBC 0.0 0.0 - 0.2 %  Glucose, capillary     Status: Abnormal   Collection Time: 07/13/21  7:42 AM  Result Value Ref Range   Glucose-Capillary 313 (H) 70 - 99 mg/dL    SUBJECTIVE:  Per patient no issues this morning with breathing.  No pain.  OBJECTIVE:  GEN- supine in bed in NAD NECK- stoma patent with humidified oxygen in place.  Minimal crusting along posterior and lateral walls of trachea where plug was removed  IMPRESSION:  s/p removal of large mucus/debris plug from tracheostoma  PLAN:  Continue humidification and continue intermittent saline drops to help with dryness and crusting.  Please re-consult if concerned regarding buildup.  Airway widely patent.  Follow up with [name] at [facility]  regarding TEP.  Patient will also need dilation of neopharynx based on swallowing study done at [facility].  Connor Aguilar 07/13/2021, 8:21 AM

## 2021-07-13 NOTE — Evaluation (Signed)
Physical Therapy Evaluation Patient Details Name: Connor Aguilar MRN: [id number] DOB: 14-Mar-1941 Today's Date: 07/13/2021  History of Present Illness  Pt is an 80 y/o M admitted on 07/11/21 with c/c of progressive SOB. Pt is being treated for acute on chronic respiratory failure with hypoxemia 2/2 mucous plug & large pleural effusion. 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 Pt's son present & provides PLOF information. Approximately 5-6 weeks ago pt was residing in a camper with a ramped entrance with 24/7 supervision/assist from his 2 sons & an aide. Pt ambulated without AD, toileted without assistance, but required assistance for bathing, dressing, & tube feeds. Pt then had PNA & after hospitalization was in STR where he had limited rehabilitation. Pt's son very eager for pt to improve & return home with assistance. On this date, pt demonstrates good activation in BUE/BLE as pt is able to move extremities & participate in supine<>sit. Pt requires assistance for static sitting EOB with pt demonstrating R lateral lean. Pt endorses dizziness while sitting EOB but question accuracy of BP reading. Pt encouraged to move legs while in bed & sit up in chair position. Recommend STR upon d/c to maximize independence with functional mobility & reduce fall risk prior to return home.      Recommendations for follow up therapy are one component of a multi-disciplinary discharge planning process, led by the attending physician.  Recommendations may be updated based on patient status, additional functional criteria and insurance authorization.  Follow Up Recommendations Skilled nursing-short term rehab (<3 hours/day)    Assistance Recommended at Discharge Frequent or constant Supervision/Assistance  Functional Status Assessment Patient has had a recent decline in  their functional status and demonstrates the ability to make significant improvements in function in a reasonable and predictable amount of time.  Equipment Recommendations   (TBD in next venue)    Recommendations for Other Services       Precautions / Restrictions Precautions Precautions: Fall Restrictions Weight Bearing Restrictions: No      Mobility  Bed Mobility Overal bed mobility: Needs Assistance Bed Mobility: Supine to Sit;Sit to Supine     Supine to sit: Mod assist;HOB elevated;+2 for physical assistance Sit to supine: Mod assist;HOB elevated;+2 for physical assistance   General bed mobility comments: Pt able to initiate moving BLE to EOB & then lifting them onto the bed. Does require cuing to use bed rails & upright trunk. +2 assist to scoot to [facility].    Transfers Overall transfer level:  (not attempted on this date)                      Ambulation/Gait                  Stairs            Wheelchair Mobility    Modified Rankin (Stroke Patients Only)       Balance Overall balance assessment: Needs assistance Sitting-balance support: Bilateral upper extremity supported;Feet unsupported Sitting balance-Leahy Scale: Poor   Postural control: Right lateral lean                                   Pertinent Vitals/Pain Pain Assessment: Faces Faces Pain Scale: No hurt    Home Living Family/patient expects  to be discharged to:: Private residence Living Arrangements: Alone Available Help at Discharge: Family;Available 24 hours/day (personal aide) Type of Home:  (camper) Home Access: Ramped entrance       Home Layout: One level Home Equipment: Conservation officer, nature (2 wheels);Shower seat      Prior Function                       Hand Dominance        Extremity/Trunk Assessment   Upper Extremity Assessment Upper Extremity Assessment: Generalized weakness    Lower Extremity Assessment Lower Extremity  Assessment: Generalized weakness (B knee extension 2/5)       Communication   Communication:  (Pt with stoma, mouths words (son reports he was using vibrating device to communicate at home))  Cognition Arousal/Alertness: Awake/alert Behavior During Therapy: Flat affect Overall Cognitive Status: Impaired/Different from baseline Area of Impairment: Orientation;Attention;Memory;Awareness;Safety/judgement;Problem solving;Following commands                 Orientation Level: Disoriented to;Time;Situation   Memory: Decreased short-term memory Following Commands: Follows one step commands consistently;Follows one step commands with increased time Safety/Judgement: Decreased awareness of safety;Decreased awareness of deficits Awareness: Intellectual Problem Solving: Requires tactile cues;Decreased initiation;Slow processing;Requires verbal cues          General Comments General comments (skin integrity, edema, etc.): Pt c/o dizziness while sitting EOB but question accuracy of BP reading of 144/123 mmHg (MAP 132) as pt was leaning strongly on RUE. BP once semi fowler in bed 150/86 (MAP 107)    Exercises Other Exercises Other Exercises: Pt performed BLE LAQ with cuing throughout session.   Assessment/Plan    PT Assessment Patient needs continued PT services  PT Problem List Decreased strength;Decreased mobility;Decreased safety awareness;Decreased activity tolerance;Decreased knowledge of use of DME;Decreased balance;Decreased cognition;Decreased coordination       PT Treatment Interventions DME instruction;Therapeutic activities;Modalities;Gait training;Therapeutic exercise;Patient/family education;Stair training;Balance training;Functional mobility training;Neuromuscular re-education;Manual techniques;Wheelchair mobility training;Cognitive remediation    PT Goals (Current goals can be found in the Care Plan section)  Acute Rehab PT Goals Patient Stated Goal: get well enough to  go home with sons PT Goal Formulation: With family Time For Goal Achievement: 07/27/21 Potential to Achieve Goals: Fair    Frequency Min 2X/week   Barriers to discharge        Co-evaluation PT/OT/SLP Co-Evaluation/Treatment: Yes Reason for Co-Treatment: Complexity of the patient's impairments (multi-system involvement);Necessary to address cognition/behavior during functional activity;For patient/therapist safety;To address functional/ADL transfers PT goals addressed during session: Mobility/safety with mobility;Balance         AM-PAC PT "6 Clicks" Mobility  Outcome Measure Help needed turning from your back to your side while in a flat bed without using bedrails?: A Little Help needed moving from lying on your back to sitting on the side of a flat bed without using bedrails?: Total Help needed moving to and from a bed to a chair (including a wheelchair)?: Total Help needed standing up from a chair using your arms (e.g., wheelchair or bedside chair)?: Total Help needed to walk in hospital room?: Total Help needed climbing 3-5 steps with a railing? : Total 6 Click Score: 8    End of Session   Activity Tolerance: Patient limited by fatigue (limited by dizziness with sitting) Patient left: in bed;with call bell/phone within reach;with bed alarm set;with family/visitor present (in chair position) Nurse Communication: Mobility status PT Visit Diagnosis: Unsteadiness on feet (R26.81);Difficulty in walking, not elsewhere classified (R26.2);Muscle weakness (generalized) (M62.81)  Time: 4709-2957 PT Time Calculation (min) (ACUTE ONLY): 26 min   Charges:   PT Evaluation $PT Eval High Complexity: 1 High          Lavone Nian, PT, DPT 07/13/21, 12:01 PM   [name] 07/13/2021, 11:58 AM

## 2021-07-13 NOTE — Progress Notes (Signed)
PROGRESS NOTE    Connor Aguilar  NTZ:[id number] DOB: 11/16/1940 DOA: 07/11/2021 PCP: Delsa Grana, PA-C    Brief Narrative:   Connor Aguilar is a 80 y.o. male with medical history significant for metastatic laryngeal cancer status post laryngectomy/XRT/chemotherapy with tracheostomy and PEG-tube placement, hypertension, type 2 diabetes mellitus and dyslipidemia, who presented to the ER with acute onset of hypoxemia at [facility] where he resides.  He just completed antibiotics yesterday for aspiration pneumonia. Upon arriving the emergency room, he was a placed on 40% oxygen. Checks x-ray showed bilateral pleural effusion, large right side, mucous plug.  Assessment & Plan:   Active Problems:   Metastatic squamous cell carcinoma to head and neck (HCC)   Moderate protein-calorie malnutrition (HCC)   Hyponatremia   Acute on chronic respiratory failure with hypoxia (HCC)   Uncontrolled type 2 diabetes mellitus with hyperglycemia (HCC)   Pleural effusion   Mucus plug in respiratory tract  Acute on chronic respiratory failure with hypoxemia secondary to mucous plug and a large pleural effusion. Bilateral pleural effusion, larger on right side. Mucous plug. Severe sepsis is ruled out as patient does not have infection source. Patient is a seen by ENT, removed a very large mucous plug from airway, it was rubber-like. After removing of mucus plug, patient condition had improved, condition is improved.  Currently on 28% of oxygen from 40% yesterday. Patient will have thoracentesis performed today. Will discharge patient tomorrow.  Uncontrolled type 2 diabetes with hyperglycemia. Continue Lantus and sliding scale insulin.  Head and neck cancer status post tracheostomy and PEG. Tube feeding restarted.  Tolerating well.   DVT prophylaxis: SCDs Code Status: full Family Communication:  Disposition Plan:      Status is: Inpatient   Remains inpatient appropriate because: Severity  of disease, pending procedure.        I/O last 3 completed shifts: In: 4399.1 [I.V.:947.5; Other:410; NG/GT:652.5; IV Piggyback:2389.1] Out: 6759 [Urine:1290] Total I/O In: 256.6 [Other:120; NG/GT:136.6] Out: -      Subjective: Patient condition much improved today, oxygen requirement dropped down to 28% from 40% yesterday.  No additional short of breath. Denies any abdominal pain nausea vomiting. No fever chills pain No dysuria hematuria. Patient is nonverbal.  Objective: Vitals:   07/13/21 0500 07/13/21 0600 07/13/21 0747 07/13/21 0800  BP:  125/66  126/75  Pulse: 95 97  96  Resp: 18 (!) 21  (!) 26  Temp:   98 F (36.7 C)   TempSrc:   Axillary   SpO2: 98% 100%  99%  Weight:      Height:        Intake/Output Summary (Last 24 hours) at 07/13/2021 1012 Last data filed at 07/13/2021 0900 Gross per 24 hour  Intake 1232.32 ml  Output 740 ml  Net 492.32 ml   Filed Weights   07/11/21 1736  Weight: 56.7 kg    Examination:  General exam: Appears calm and comfortable  Respiratory system: Clear to auscultation. Respiratory effort normal. Cardiovascular system: S1 & S2 heard, RRR. No JVD, murmurs, rubs, gallops or clicks. No pedal edema. Gastrointestinal system: Abdomen is nondistended, soft and nontender. No organomegaly or masses felt. Normal bowel sounds heard. Central nervous system: Nonverbal. No focal neurological deficits. Extremities: Symmetric 5 x 5 power. Skin: No rashes, lesions or ulcers Psychiatry: Mood & affect appropriate.     Data Reviewed: I have personally reviewed following labs and imaging studies  CBC: Recent Labs  Lab 07/11/21 1731 07/11/21 2023 07/12/21 1309 07/13/21  0518  WBC 10.3 9.0 8.1 7.1  NEUTROABS 9.3*  --   --   --   HGB 14.5 13.2 12.3* 12.1*  HCT 44.6 40.1 37.6* 36.9*  MCV 93.3 92.2 91.9 91.8  PLT 374 332 307 846   Basic Metabolic Panel: Recent Labs  Lab 07/11/21 1731 07/11/21 2023 07/12/21 1309 07/13/21 0518  NA  131*  --  136 135  K 4.5  --  3.6 3.7  CL 96*  --  106 104  CO2 24  --  24 24  GLUCOSE 328*  --  91 288*  BUN 32*  --  22 20  CREATININE 0.78 0.75 0.61 0.59*  CALCIUM 8.8*  --  7.9* 8.1*  MG  --   --   --  2.0  PHOS  --   --   --  2.8   GFR: Estimated Creatinine Clearance: 57.1 mL/min (A) (by C-G formula based on SCr of 0.59 mg/dL (L)). Liver Function Tests: Recent Labs  Lab 07/11/21 1731  AST 22  ALT 18  ALKPHOS 69  BILITOT 0.8  PROT 6.6  ALBUMIN 2.9*   No results for input(s): LIPASE, AMYLASE in the last 168 hours. No results for input(s): AMMONIA in the last 168 hours. Coagulation Profile: Recent Labs  Lab 07/11/21 1731  INR 1.1   Cardiac Enzymes: No results for input(s): CKTOTAL, CKMB, CKMBINDEX, TROPONINI in the last 168 hours. BNP (last 3 results) No results for input(s): PROBNP in the last 8760 hours. HbA1C: No results for input(s): HGBA1C in the last 72 hours. CBG: Recent Labs  Lab 07/12/21 1153 07/12/21 1604 07/12/21 1958 07/13/21 0324 07/13/21 0742  GLUCAP 89 91 103* 218* 313*   Lipid Profile: No results for input(s): CHOL, HDL, LDLCALC, TRIG, CHOLHDL, LDLDIRECT in the last 72 hours. Thyroid Function Tests: No results for input(s): TSH, T4TOTAL, FREET4, T3FREE, THYROIDAB in the last 72 hours. Anemia Panel: No results for input(s): VITAMINB12, FOLATE, FERRITIN, TIBC, IRON, RETICCTPCT in the last 72 hours. Sepsis Labs: Recent Labs  Lab 07/11/21 2023 07/11/21 2340 07/12/21 0424 07/12/21 1309  PROCALCITON <0.10  --   --   --   LATICACIDVEN 3.1* 3.7* 1.7 1.8    Recent Results (from the past 240 hour(s))  Resp Panel by RT-PCR (Flu A&B, Covid) Nasopharyngeal Swab     Status: None   Collection Time: 07/11/21  5:31 PM   Specimen: Nasopharyngeal Swab; Nasopharyngeal(NP) swabs in vial transport medium  Result Value Ref Range Status   SARS Coronavirus 2 by RT PCR NEGATIVE NEGATIVE Final    Comment: (NOTE) SARS-CoV-2 target nucleic acids are NOT  DETECTED.  The SARS-CoV-2 RNA is generally detectable in upper respiratory specimens during the acute phase of infection. The lowest concentration of SARS-CoV-2 viral copies this assay can detect is 138 copies/mL. A negative result does not preclude SARS-Cov-2 infection and should not be used as the sole basis for treatment or other patient management decisions. A negative result may occur with  improper specimen collection/handling, submission of specimen other than nasopharyngeal swab, presence of viral mutation(s) within the areas targeted by this assay, and inadequate number of viral copies(<138 copies/mL). A negative result must be combined with clinical observations, patient history, and epidemiological information. The expected result is Negative.  Fact Sheet for Patients:  EntrepreneurPulse.com.au  Fact Sheet for Healthcare Providers:  IncredibleEmployment.be  This test is no t yet approved or cleared by the [place] FDA and  has been authorized for detection and/or diagnosis of  SARS-CoV-2 by FDA under an Emergency Use Authorization (EUA). This EUA will remain  in effect (meaning this test can be used) for the duration of the COVID-19 declaration under Section 564(b)(1) of the Act, 21 U.S.C.section 360bbb-3(b)(1), unless the authorization is terminated  or revoked sooner.       Influenza A by PCR NEGATIVE NEGATIVE Final   Influenza B by PCR NEGATIVE NEGATIVE Final    Comment: (NOTE) The Xpert Xpress SARS-CoV-2/FLU/RSV plus assay is intended as an aid in the diagnosis of influenza from Nasopharyngeal swab specimens and should not be used as a sole basis for treatment. Nasal washings and aspirates are unacceptable for Xpert Xpress SARS-CoV-2/FLU/RSV testing.  Fact Sheet for Patients: EntrepreneurPulse.com.au  Fact Sheet for Healthcare Providers: IncredibleEmployment.be  This test is not yet  approved or cleared by the [place] FDA and has been authorized for detection and/or diagnosis of SARS-CoV-2 by FDA under an Emergency Use Authorization (EUA). This EUA will remain in effect (meaning this test can be used) for the duration of the COVID-19 declaration under Section 564(b)(1) of the Act, 21 U.S.C. section 360bbb-3(b)(1), unless the authorization is terminated or revoked.  Performed at [facility], [address] [place], [place] [address]   Blood Culture (routine x 2)     Status: None (Preliminary result)   Collection Time: 07/11/21  5:34 PM   Specimen: BLOOD  Result Value Ref Range Status   Specimen Description   Final    BLOOD Blood Culture results may not be optimal due to an inadequate volume of blood received in culture bottles   Special Requests   Final    BOTTLES DRAWN AEROBIC AND ANAEROBIC LEFT ANTECUBITAL   Culture   Final    NO GROWTH 2 DAYS Performed at [facility] [address] [place], [place] [address]    Report Status PENDING  Incomplete  Blood Culture (routine x 2)     Status: None (Preliminary result)   Collection Time: 07/11/21  5:44 PM   Specimen: BLOOD  Result Value Ref Range Status   Specimen Description   Final    BLOOD Blood Culture results may not be optimal due to an inadequate volume of blood received in culture bottles   Special Requests   Final    BOTTLES DRAWN AEROBIC AND ANAEROBIC LEFT ANTECUBITAL   Culture   Final    NO GROWTH 2 DAYS Performed at [facility], [address] [place], [place], [place] [address]    Report Status PENDING  Incomplete  Urine Culture     Status: None   Collection Time: 07/11/21  8:23 PM   Specimen: In/Out Cath Urine  Result Value Ref Range Status   Specimen Description   Final    IN/OUT CATH URINE Performed at [facility], [address], [place], [place] [address]    Special Requests   Final    NONE Performed at [facility], [address] [place], [place] [address]    Culture   Final    NO GROWTH Performed at [facility], [address], [place], [place] [address]    Report Status 07/13/2021 FINAL  Final  MRSA Next Gen by PCR, Nasal     Status: None   Collection Time: 07/12/21  5:03 AM   Specimen: Nasal Mucosa; Nasal Swab  Result Value Ref Range Status   MRSA by PCR Next Gen NOT DETECTED NOT DETECTED Final    Comment: (NOTE) The GeneXpert  MRSA Assay (FDA approved for NASAL specimens only), is one component of a comprehensive MRSA colonization surveillance program. It is not intended to diagnose MRSA infection nor to guide or monitor treatment for MRSA infections. Test performance is not FDA approved in patients less than 53 years old. Performed at [facility], [address] [place], [place] [address]          Radiology Studies: CT Chest W Contrast  Result Date: 07/11/2021 CLINICAL DATA:  Pneumonia, effusion or abscess suspected, xray done. Hypoxia, excessive respiratory secretion. EXAM: CT CHEST WITH CONTRAST TECHNIQUE: Multidetector CT imaging of the chest was performed during intravenous contrast administration. CONTRAST:  57mL OMNIPAQUE IOHEXOL 300 MG/ML  SOLN COMPARISON:  03/20/2020 FINDINGS: Cardiovascular: Extensive multi-vessel coronary artery calcification is present. Global cardiac size is within normal limits. Trace pericardial effusion. The central pulmonary arteries are of normal caliber. Moderate atherosclerotic calcification within the thoracic aorta. No aortic aneurysm. Mediastinum/Nodes: A stoma related to prior tracheostomy is identified at the a base of the neck anteriorly. In this region, there is an additional device creating a communication between the cervical esophagus and the tracheostomy stoma possibly representing a spit fistula, best seen on axial image # 27/7 and sagittal image # 88/6. There is debris within the proximal and mid esophagus. There is, additionally, debris  within the trachea just below the fistula appliance possibly related to aspiration, best seen on image # 37/7 and sagittal image # 92/6. There is asymmetric focal thickening of the midesophagus best seen on axial image # 61/7, not well assessed on this examination. No pathologic thoracic adenopathy. Numerous surgical clips are seen at the neck base related to bilateral neck dissection. Infiltration of the fascial planes at the neck base likely relates to changes of prior radiation therapy. Small residual probable thyroid tissue at the left neck base adjacent to the tracheostomy stoma. Lungs/Pleura: Large right and moderate left pleural effusions are present with near complete collapse of the right middle and lower lobes and compressive atelectasis of the right upper lobe and left lower lobe. There is extensive airway impaction within the collapsed right middle and lower lobes noted. No definite central obstructing mass. Upper Abdomen: Balloon retention gastrostomy catheter is in place. No acute abnormality within the visualized upper abdomen. Musculoskeletal: No acute bone abnormality. Interval development of a remote appearing T9 anterior wedge compression fracture with 60-70% loss of height. IMPRESSION: Interval neck dissection, tracheostomy, and fistula creation between the cervical esophagus and trachea at the tracheostomy possibly reflecting spit fistula formation. Debris within the proximal esophagus and within the a trachea just below the fistula plants may reflect changes of reflux and aspiration. Large right and moderate left pleural effusions with compressive atelectasis of the lungs bilaterally, right greater than left. Superimposed extensive airway impaction predominantly within the right middle and lower lobes. Asymmetric focal thickening of the mid esophagus. While this may relate to focal secretions, a superimposed mass is difficult to exclude and is not well assessed on this examination. This would  be better assessed with dedicated endoscopy once the patient's acute issues have resolved, if indicated. Extensive multi-vessel coronary artery calcification. Aortic Atherosclerosis (ICD10-I70.0). Electronically Signed   By: Fidela Salisbury M.D.   On: 07/11/2021 19:34   DG Chest Port 1 View  Result Date: 07/11/2021 CLINICAL DATA:  Questionable sepsis. EXAM: PORTABLE CHEST 1 VIEW COMPARISON:  May 19, 2021 FINDINGS: The cardiac silhouette is partially obscured by right pleural effusion. Tortuosity and calcific atherosclerotic disease of the aorta. Incomplete  inspiration. Bilateral pleural effusions, larger on the right. Interstitial pulmonary edema. Bibasilar airspace consolidation cannot be excluded. Osseous structures are without acute abnormality. Stable postsurgical changes. IMPRESSION: 1. Bilateral pleural effusions, larger on the right. 2. Interstitial pulmonary edema. 3. Bibasilar airspace consolidation cannot be excluded. Electronically Signed   By: Fidela Salisbury M.D.   On: 07/11/2021 17:46        Scheduled Meds:  acetylcysteine  2 mL Nebulization TID   chlorhexidine  15 mL Mouth Rinse BID   Chlorhexidine Gluconate Cloth  6 each Topical Daily   feeding supplement (PROSource TF)  45 mL Per Tube Daily   folic acid  1 mg Per Tube Daily   free water  30 mL Per Tube Q4H   guaiFENesin  15 mL Per Tube QID   insulin aspart  0-20 Units Subcutaneous TID PC & HS   insulin glargine-yfgn  8 Units Subcutaneous Daily   ipratropium-albuterol  3 mL Nebulization Q6H   levothyroxine  75 mcg Per Tube Q0600   magnesium oxide  400 mg Per Tube BID   nystatin  5 mL Oral QID   rosuvastatin  5 mg Per Tube Q2000   sertraline  25 mg Per Tube Daily   Continuous Infusions:  feeding supplement (OSMOLITE 1.5 CAL) 55 mL/hr at 07/12/21 2217     LOS: 2 days    Time spent: 27 minutes    Sharen Hones, MD [facility]   To contact the attending provider between 7A-7P or the covering provider  during after hours 7P-7A, please log into the web site [email or web address] and access using universal [place] password for that web site. If you do not have the password, please call the hospital operator.  07/13/2021, 10:12 AM

## 2021-07-14 ENCOUNTER — Encounter: Payer: Self-pay | Admitting: Family Medicine

## 2021-07-14 DIAGNOSIS — Z515 Encounter for palliative care: Secondary | ICD-10-CM

## 2021-07-14 DIAGNOSIS — T17998A Other foreign object in respiratory tract, part unspecified causing other injury, initial encounter: Secondary | ICD-10-CM | POA: Diagnosis not present

## 2021-07-14 DIAGNOSIS — J159 Unspecified bacterial pneumonia: Secondary | ICD-10-CM | POA: Diagnosis not present

## 2021-07-14 DIAGNOSIS — J9610 Chronic respiratory failure, unspecified whether with hypoxia or hypercapnia: Secondary | ICD-10-CM | POA: Diagnosis not present

## 2021-07-14 DIAGNOSIS — I4891 Unspecified atrial fibrillation: Secondary | ICD-10-CM

## 2021-07-14 DIAGNOSIS — C7989 Secondary malignant neoplasm of other specified sites: Secondary | ICD-10-CM | POA: Diagnosis not present

## 2021-07-14 DIAGNOSIS — R1310 Dysphagia, unspecified: Secondary | ICD-10-CM | POA: Diagnosis not present

## 2021-07-14 DIAGNOSIS — I480 Paroxysmal atrial fibrillation: Secondary | ICD-10-CM

## 2021-07-14 DIAGNOSIS — J9621 Acute and chronic respiratory failure with hypoxia: Secondary | ICD-10-CM | POA: Diagnosis not present

## 2021-07-14 DIAGNOSIS — Z7189 Other specified counseling: Secondary | ICD-10-CM

## 2021-07-14 LAB — BASIC METABOLIC PANEL
Anion gap: 8 (ref 5–15)
BUN: 19 mg/dL (ref 8–23)
CO2: 25 mmol/L (ref 22–32)
Calcium: 8.1 mg/dL — ABNORMAL LOW (ref 8.9–10.3)
Chloride: 103 mmol/L (ref 98–111)
Creatinine, Ser: 0.6 mg/dL — ABNORMAL LOW (ref 0.61–1.24)
GFR, Estimated: >60 mL/min (ref >60)
Glucose, Bld: 306 mg/dL — ABNORMAL HIGH (ref 70–99)
Potassium: 4.1 mmol/L (ref 3.5–5.1)
Sodium: 136 mmol/L (ref 135–145)

## 2021-07-14 LAB — MAGNESIUM: Magnesium: 1.9 mg/dL (ref 1.7–2.4)

## 2021-07-14 LAB — LEGIONELLA PNEUMOPHILA SEROGP 1 UR AG: L. pneumophila Serogp 1 Ur Ag: NEGATIVE

## 2021-07-14 LAB — GLUCOSE, CAPILLARY
Glucose-Capillary: 322 mg/dL — ABNORMAL HIGH (ref 70–99)
Glucose-Capillary: 160 mg/dL — ABNORMAL HIGH (ref 70–99)
Glucose-Capillary: 253 mg/dL — ABNORMAL HIGH (ref 70–99)
Glucose-Capillary: 164 mg/dL — ABNORMAL HIGH (ref 70–99)

## 2021-07-14 LAB — CYTOLOGY - NON PAP

## 2021-07-14 MED ORDER — INSULIN GLARGINE-YFGN 100 UNIT/ML ~~LOC~~ SOLN
14.0000 [IU] | Freq: Every day | SUBCUTANEOUS | Status: DC
  Administered 2021-07-14 – 2021-07-15 (×2): 14 [IU] via SUBCUTANEOUS
  Filled 2021-07-14 (×2): qty 0.14

## 2021-07-14 MED ORDER — DIGOXIN 0.25 MG/ML IJ SOLN
0.2500 mg | Freq: Once | INTRAMUSCULAR | Status: AC
Start: 2021-07-14 — End: 2021-07-14
  Administered 2021-07-14: 0.25 mg via INTRAVENOUS
  Filled 2021-07-14: qty 2

## 2021-07-14 MED ORDER — AMIODARONE HCL IN DEXTROSE 360-4.14 MG/200ML-% IV SOLN: 30.0000 mg/h | INTRAVENOUS | Status: DC

## 2021-07-14 MED ORDER — ONDANSETRON HCL 4 MG/2ML IJ SOLN: 4.0000 mg | Freq: Four times a day (QID) | INTRAMUSCULAR | Status: DC | PRN

## 2021-07-14 MED ORDER — DILTIAZEM HCL 60 MG PO TABS
60.0000 mg | ORAL_TABLET | Freq: Four times a day (QID) | ORAL | Status: DC
  Administered 2021-07-14 – 2021-07-15 (×4): 60 mg
  Filled 2021-07-14 (×4): qty 1

## 2021-07-14 MED ORDER — AMIODARONE HCL IN DEXTROSE 360-4.14 MG/200ML-% IV SOLN
60.0000 mg/h | INTRAVENOUS | Status: DC
  Administered 2021-07-14: 60 mg/h via INTRAVENOUS
  Filled 2021-07-14 (×2): qty 200

## 2021-07-14 MED ORDER — AMIODARONE LOAD VIA INFUSION
150.0000 mg | Freq: Once | INTRAVENOUS | Status: AC
  Administered 2021-07-14: 150 mg via INTRAVENOUS
  Filled 2021-07-14: qty 83.34

## 2021-07-14 MED ORDER — ONDANSETRON HCL 4 MG PO TABS: 4.0000 mg | ORAL_TABLET | Freq: Four times a day (QID) | ORAL | Status: DC | PRN

## 2021-07-14 MED ORDER — AMIODARONE HCL 200 MG PO TABS
200.0000 mg | ORAL_TABLET | Freq: Two times a day (BID) | ORAL | Status: DC
  Administered 2021-07-14 – 2021-07-15 (×2): 200 mg
  Filled 2021-07-14 (×2): qty 1

## 2021-07-14 MED ORDER — DILTIAZEM HCL 25 MG/5ML IV SOLN
INTRAVENOUS | Status: AC
  Filled 2021-07-14: qty 5

## 2021-07-14 MED ORDER — ENOXAPARIN SODIUM 40 MG/0.4ML IJ SOSY
40.0000 mg | PREFILLED_SYRINGE | INTRAMUSCULAR | Status: DC
  Administered 2021-07-14: 40 mg via SUBCUTANEOUS
  Filled 2021-07-14: qty 0.4

## 2021-07-14 MED ORDER — DILTIAZEM HCL 25 MG/5ML IV SOLN
10.0000 mg | Freq: Once | INTRAVENOUS | Status: AC
  Administered 2021-07-14: 10 mg via INTRAVENOUS

## 2021-07-14 MED ORDER — METOPROLOL TARTRATE 5 MG/5ML IV SOLN
5.0000 mg | Freq: Four times a day (QID) | INTRAVENOUS | Status: DC | PRN
  Filled 2021-07-14: qty 5

## 2021-07-14 NOTE — Plan of Care (Signed)

## 2021-07-14 NOTE — Progress Notes (Signed)
OT Cancellation Note  Patient Details Name: Connor Aguilar MRN: [id number] DOB: Sep 29, 1940   Cancelled Treatment:    Reason Eval/Treat Not Completed: Patient at procedure or test/ unavailable. Chart reviewed, upon arrival RT at bedside completing breathing treatment. Will continue to follow POC as pt available.   Dessie Coma, M.S. OTR/L  07/14/21, 3:33 PM  ascom [phone]

## 2021-07-14 NOTE — Progress Notes (Addendum)
eLink Physician-Brief Progress Note Patient Name: Connor Aguilar DOB: [birth year] MRN: [id number]   Date of Service  07/14/2021  HPI/Events of Note  Patient went back into atrial fibrillation with RVR, HR 120 - 130, BP 151/100. AM electrolytes are pending.  eICU Interventions  Cardizem 10 mg iv x 1 ordered, if patient fails to convert and remains rapid, will start Cardizem gtt. Will follow up on AM electrolytes and replete K+ and Mg++ AS INDICATED.        [name] 07/14/2021, 3:38 AM

## 2021-07-14 NOTE — Progress Notes (Signed)
Amiodarone started this AM for A-fib. Pt converted to NSR this evening. Stoma becoming more occluded. RT unable to pass 14 Fr suction catheter this AM. Using 10 Fr suction catheter. MD made aware.

## 2021-07-14 NOTE — Progress Notes (Signed)
Patient has not voided since shift change. Bladder scan only shows 22ml of residual. Patient denies urge to void at this time. Will continue to monitor.

## 2021-07-14 NOTE — Progress Notes (Signed)
Patient flipped back into Afib with RVR 120s. BP elevated, 151/100. UOP marginal. Elink contacted for further assistance.

## 2021-07-14 NOTE — Progress Notes (Signed)
Inpatient Diabetes Program Recommendations  AACE/ADA: New Consensus Statement on Inpatient Glycemic Control   Target Ranges:  Prepandial:   less than 140 mg/dL      Peak postprandial:   less than 180 mg/dL (1-2 hours)      Critically ill patients:  140 - 180 mg/dL    Latest Reference Range & Units 07/13/21 07:42 07/13/21 11:33 07/13/21 15:19 07/13/21 19:35 07/13/21 21:54 07/14/21 07:20  Glucose-Capillary 70 - 99 mg/dL 313 (H) 176 (H) 121 (H) 178 (H) 194 (H) 322 (H)   Review of Glycemic Control  Diabetes history: DM2 Outpatient Diabetes medications: Semglee 8 units QHS, Humalog 0-12 units BID Current orders for Inpatient glycemic control: Semglee 14 units daily, Novolog 0-20 units AC&HS; Osmolite @ 55 ml/hr  Inpatient Diabetes Program Recommendations:    Insulin: Noted Semglee increased from 8 to 14 units daily today. Patient remains NPO and receiving Osmolite @ 55 ml/hr. Please change CBGs to Q4H and Novolog 0-20 units to Q4H. If glucose remains consistently over 180 mg/dl, please consider ordering Novolog 3 units Q4H for tube feeding coverage.  Thanks, Barnie Alderman, RN, MSN, CDE Diabetes Coordinator Inpatient Diabetes Program [phone] (Team Pager from 8am to 5pm)

## 2021-07-14 NOTE — Progress Notes (Signed)
Verbal order to discontinue amiodarone gtt and start amiodarone 200 mg BID per tube tonight.   Pt refused nystatin this afternoon, pt affect is withdrawn. Denies pain. Nods and gestures appropriately. Will continue to monitor.

## 2021-07-14 NOTE — Progress Notes (Signed)
PROGRESS NOTE    Connor Aguilar  UDJ:[id number] DOB: [birth year] DOA: 07/11/2021 PCP: Delsa Grana, PA-C    Brief Narrative:  Connor Aguilar is a 80 y.o. male with medical history significant for metastatic laryngeal cancer status post laryngectomy/XRT/chemotherapy with tracheostomy and PEG-tube placement, hypertension, type 2 diabetes mellitus and dyslipidemia, who presented to the ER with acute onset of hypoxemia at [facility] where he resides.  He just completed antibiotics yesterday for aspiration pneumonia. Upon arriving the emergency room, he was a placed on 40% oxygen. Checks x-ray showed bilateral pleural effusion, large right side, mucous plug.  ENT removed a large mucous plug from his trach.  Thoracentesis removed 1 L of fluids on 12/5. Developed atrial fibrillation with RVR on 12/6.   Assessment & Plan:   Active Problems:   Metastatic squamous cell carcinoma to head and neck (HCC)   Moderate protein-calorie malnutrition (HCC)   Hyponatremia   Acute on chronic respiratory failure with hypoxia (HCC)   Uncontrolled type 2 diabetes mellitus with hyperglycemia (HCC)   Pleural effusion   Mucus plug in respiratory tract   Protein-calorie malnutrition, severe  New onset atrial fibrillation with rapid ventricle response. Patient developed new onset atrial fibrillation last night.  Heart rate is running high, between 120-130. Will start amiodarone drip. Patient is also given scheduled diltiazem via feeding tube.  As needed metoprolol 5 mg every 6 hours. Also give digoxin 0.25 mg IV. Due to poor prognosis, was not yet started for anticoagulation, pending palliative care consult.  Acute on chronic respiratory failure with hypoxemia secondary to mucous plug and a large pleural effusion. Bilateral pleural effusion, larger on right side. Mucous plug. Severe sepsis is ruled out as patient does not have infection source. Respiratory status had improved.  Currently patient on 8 L  oxygen over trach.  Removed 1 L of fluid from right side pleural effusion.  Initial study does not indicate infection.  But is exudates.  Pending culture as well as cytology. No need for antibiotics use.   Uncontrolled type 2 diabetes with hyperglycemia. Continue Lantus and sliding scale insulin.  Head and neck cancer status post tracheostomy and PEG. Tube feeding restarted.  Tolerating well.    DVT prophylaxis: Lovenox Code Status: full Family Communication:  Disposition Plan:    Status is: Inpatient  Remains inpatient appropriate because: Severity of disease.        I/O last 3 completed shifts: In: 2289.1 [Other:250; NG/GT:2039.1] Out: 1211 [Urine:1150; Emesis/NG output:1; Drains:60] No intake/output data recorded.     Consultants:  Palliative  Procedures: None  Antimicrobials: None  Subjective: Patient developed atrial fibrillation with rapid ventricle response today. Patient does not feel short of breath. He is tolerating tube feeding without nausea vomiting or diarrhea. No fever chills No dysuria or hematuria.  Objective: Vitals:   07/14/21 0207 07/14/21 0300 07/14/21 0700 07/14/21 0800  BP:   122/75 122/78  Pulse:  72 97 (!) 33  Resp:      Temp:  (!) 97.5 F (36.4 C)  98.1 F (36.7 C)  TempSrc:  Axillary  Oral  SpO2: 97% 97% 100% 100%  Weight:      Height:        Intake/Output Summary (Last 24 hours) at 07/14/2021 1112 Last data filed at 07/14/2021 0500 Gross per 24 hour  Intake 1250 ml  Output 761 ml  Net 489 ml   Filed Weights   07/11/21 1736  Weight: 56.7 kg    Examination:  General exam: Appears  calm and comfortable  Respiratory system: Irregularly irregular and tachycardic. Respiratory effort normal. Cardiovascular system: S1 & S2 heard, RRR. No JVD, murmurs, rubs, gallops or clicks. No pedal edema. Gastrointestinal system: Abdomen is nondistended, soft and nontender. No organomegaly or masses felt. Normal bowel sounds  heard. Central nervous system: Alert and oriented. No focal neurological deficits. Extremities: Symmetric 5 x 5 power. Skin: No rashes, lesions or ulcers Psychiatry: Mood & affect appropriate.     Data Reviewed: I have personally reviewed following labs and imaging studies  CBC: Recent Labs  Lab 07/11/21 1731 07/11/21 2023 07/12/21 1309 07/13/21 0518  WBC 10.3 9.0 8.1 7.1  NEUTROABS 9.3*  --   --   --   HGB 14.5 13.2 12.3* 12.1*  HCT 44.6 40.1 37.6* 36.9*  MCV 93.3 92.2 91.9 91.8  PLT 374 332 307 657   Basic Metabolic Panel: Recent Labs  Lab 07/11/21 1731 07/11/21 2023 07/12/21 1309 07/13/21 0518 07/14/21 0459  NA 131*  --  136 135 136  K 4.5  --  3.6 3.7 4.1  CL 96*  --  106 104 103  CO2 24  --  24 24 25   GLUCOSE 328*  --  91 288* 306*  BUN 32*  --  22 20 19   CREATININE 0.78 0.75 0.61 0.59* 0.60*  CALCIUM 8.8*  --  7.9* 8.1* 8.1*  MG  --   --   --  2.0 1.9  PHOS  --   --   --  2.8  --    GFR: Estimated Creatinine Clearance: 57.1 mL/min (A) (by C-G formula based on SCr of 0.6 mg/dL (L)). Liver Function Tests: Recent Labs  Lab 07/11/21 1731  AST 22  ALT 18  ALKPHOS 69  BILITOT 0.8  PROT 6.6  ALBUMIN 2.9*   No results for input(s): LIPASE, AMYLASE in the last 168 hours. No results for input(s): AMMONIA in the last 168 hours. Coagulation Profile: Recent Labs  Lab 07/11/21 1731  INR 1.1   Cardiac Enzymes: No results for input(s): CKTOTAL, CKMB, CKMBINDEX, TROPONINI in the last 168 hours. BNP (last 3 results) No results for input(s): PROBNP in the last 8760 hours. HbA1C: Recent Labs    07/11/21 2023  HGBA1C 9.0*   CBG: Recent Labs  Lab 07/13/21 1133 07/13/21 1519 07/13/21 1935 07/13/21 2154 07/14/21 0720  GLUCAP 176* 121* 178* 194* 322*   Lipid Profile: No results for input(s): CHOL, HDL, LDLCALC, TRIG, CHOLHDL, LDLDIRECT in the last 72 hours. Thyroid Function Tests: No results for input(s): TSH, T4TOTAL, FREET4, T3FREE, THYROIDAB in  the last 72 hours. Anemia Panel: No results for input(s): VITAMINB12, FOLATE, FERRITIN, TIBC, IRON, RETICCTPCT in the last 72 hours. Sepsis Labs: Recent Labs  Lab 07/11/21 2023 07/11/21 2340 07/12/21 0424 07/12/21 1309  PROCALCITON <0.10  --   --   --   LATICACIDVEN 3.1* 3.7* 1.7 1.8    Recent Results (from the past 240 hour(s))  Resp Panel by RT-PCR (Flu A&B, Covid) Nasopharyngeal Swab     Status: None   Collection Time: 07/11/21  5:31 PM   Specimen: Nasopharyngeal Swab; Nasopharyngeal(NP) swabs in vial transport medium  Result Value Ref Range Status   SARS Coronavirus 2 by RT PCR NEGATIVE NEGATIVE Final    Comment: (NOTE) SARS-CoV-2 target nucleic acids are NOT DETECTED.  The SARS-CoV-2 RNA is generally detectable in upper respiratory specimens during the acute phase of infection. The lowest concentration of SARS-CoV-2 viral copies this assay can detect is 138 copies/mL.  A negative result does not preclude SARS-Cov-2 infection and should not be used as the sole basis for treatment or other patient management decisions. A negative result may occur with  improper specimen collection/handling, submission of specimen other than nasopharyngeal swab, presence of viral mutation(s) within the areas targeted by this assay, and inadequate number of viral copies(<138 copies/mL). A negative result must be combined with clinical observations, patient history, and epidemiological information. The expected result is Negative.  Fact Sheet for Patients:  EntrepreneurPulse.com.au  Fact Sheet for Healthcare Providers:  IncredibleEmployment.be  This test is no t yet approved or cleared by the [place] FDA and  has been authorized for detection and/or diagnosis of SARS-CoV-2 by FDA under an Emergency Use Authorization (EUA). This EUA will remain  in effect (meaning this test can be used) for the duration of the COVID-19 declaration under Section  564(b)(1) of the Act, 21 U.S.C.section 360bbb-3(b)(1), unless the authorization is terminated  or revoked sooner.       Influenza A by PCR NEGATIVE NEGATIVE Final   Influenza B by PCR NEGATIVE NEGATIVE Final    Comment: (NOTE) The Xpert Xpress SARS-CoV-2/FLU/RSV plus assay is intended as an aid in the diagnosis of influenza from Nasopharyngeal swab specimens and should not be used as a sole basis for treatment. Nasal washings and aspirates are unacceptable for Xpert Xpress SARS-CoV-2/FLU/RSV testing.  Fact Sheet for Patients: EntrepreneurPulse.com.au  Fact Sheet for Healthcare Providers: IncredibleEmployment.be  This test is not yet approved or cleared by the [place] FDA and has been authorized for detection and/or diagnosis of SARS-CoV-2 by FDA under an Emergency Use Authorization (EUA). This EUA will remain in effect (meaning this test can be used) for the duration of the COVID-19 declaration under Section 564(b)(1) of the Act, 21 U.S.C. section 360bbb-3(b)(1), unless the authorization is terminated or revoked.  Performed at [facility], [address] [place], [place] [address]   Blood Culture (routine x 2)     Status: None (Preliminary result)   Collection Time: 07/11/21  5:34 PM   Specimen: BLOOD  Result Value Ref Range Status   Specimen Description   Final    BLOOD Blood Culture results may not be optimal due to an inadequate volume of blood received in culture bottles   Special Requests   Final    BOTTLES DRAWN AEROBIC AND ANAEROBIC LEFT ANTECUBITAL   Culture   Final    NO GROWTH 3 DAYS Performed at [facility], [address], [place], [place] [address]    Report Status PENDING  Incomplete  Blood Culture (routine x 2)     Status: None (Preliminary result)   Collection Time: 07/11/21  5:44 PM   Specimen: BLOOD  Result Value Ref Range Status   Specimen Description   Final    BLOOD Blood Culture  results may not be optimal due to an inadequate volume of blood received in culture bottles   Special Requests   Final    BOTTLES DRAWN AEROBIC AND ANAEROBIC LEFT ANTECUBITAL   Culture   Final    NO GROWTH 3 DAYS Performed at [facility], [address], [place], [place] [address]    Report Status PENDING  Incomplete  Urine Culture     Status: None   Collection Time: 07/11/21  8:23 PM   Specimen: In/Out Cath Urine  Result Value Ref Range Status   Specimen Description   Final    IN/OUT CATH URINE Performed at [facility],  [place], [place] [address]    Special Requests   Final    NONE Performed at [facility], [address], [place], [place] [address]    Culture   Final    NO GROWTH Performed at [facility], [place] [address] [place], [place] [address]    Report Status 07/13/2021 FINAL  Final  MRSA Next Gen by PCR, Nasal     Status: None   Collection Time: 07/12/21  5:03 AM   Specimen: Nasal Mucosa; Nasal Swab  Result Value Ref Range Status   MRSA by PCR Next Gen NOT DETECTED NOT DETECTED Final    Comment: (NOTE) The GeneXpert MRSA Assay (FDA approved for NASAL specimens only), is one component of a comprehensive MRSA colonization surveillance program. It is not intended to diagnose MRSA infection nor to guide or monitor treatment for MRSA infections. Test performance is not FDA approved in patients less than 29 years old. Performed at [facility], [address] [place], [place] [address]   Body fluid culture w Gram Stain     Status: None (Preliminary result)   Collection Time: 07/13/21 12:00 PM   Specimen: Lung, Right; Pleural Fluid  Result Value Ref Range Status   Specimen Description   Final    PLEURAL Performed at [facility], [address], [place], [place] [address]    Special Requests   Final    NONE Performed at [facility], [address] [place], [place] [address]     Gram Stain   Final    RARE WBC PRESENT, PREDOMINANTLY MONONUCLEAR NO ORGANISMS SEEN    Culture   Final    NO GROWTH < 24 HOURS Performed at [facility], [place] [address], [place], [place] [address]    Report Status PENDING  Incomplete         Radiology Studies: DG Chest Port 1 View  Result Date: 07/13/2021 CLINICAL DATA:  Post thoracentesis EXAM: PORTABLE CHEST 1 VIEW COMPARISON:  07/11/2021 radiograph and chest CT FINDINGS: Decreased right-sided pleural effusion post thoracentesis. No definitive pneumothorax is seen. Residual moderate bilateral effusions stable cardiomediastinal silhouette with vascular congestion and probable pulmonary edema. Airspace disease in the lower lungs with consolidation, atelectasis versus pneumonia. Masslike fullness in the right hilar region. IMPRESSION: 1. Slight decreased right pleural effusion post thoracentesis. No pneumothorax. There are moderate residual bilateral effusions 2. Stable cardiomediastinal silhouette with vascular congestion and pulmonary edema. Slight masslike enlargement of right hilar region, attention on follow-up imaging. 3. Lower lung airspace disease could reflect atelectasis or pneumonia Electronically Signed   By: Donavan Foil M.D.   On: 07/13/2021 15:53   US THORACENTESIS ASP PLEURAL SPACE W/IMG GUIDE  Result Date: 07/13/2021 INDICATION: Bilateral pleural effusions with right greater than left request received for diagnostic and therapeutic thoracentesis. EXAM: ULTRASOUND GUIDED RIGHT THORACENTESIS, DIAGNOSTIC and THERAPEUTIC MEDICATIONS: Local 1% lidocaine only. COMPLICATIONS: None immediate. PROCEDURE: An ultrasound guided thoracentesis was thoroughly discussed with the patient and questions answered. The benefits, risks, alternatives and complications were also discussed. The patient understands and wishes to proceed with the procedure. Written consent was obtained. Ultrasound was performed to localize and mark an adequate  pocket of fluid in the right chest. The area was then prepped and draped in the normal sterile fashion. 1% Lidocaine was used for local anesthesia. Under ultrasound guidance a 19 gauge, 7-cm, Yueh catheter was introduced. Thoracentesis was performed. The catheter was removed and a dressing applied. FINDINGS:  A total of approximately 1 L of clear yellow fluid was removed. Samples were sent to the laboratory as requested by the clinical team. IMPRESSION: Successful ultrasound guided diagnostic and therapeutic RIGHT thoracentesis, yielding 1 L of pleural fluid. Read By: Tsosie Billing PA-C Electronically Signed   By: Michaelle Birks M.D.   On: 07/13/2021 12:24        Scheduled Meds:  acetylcysteine  2 mL Nebulization TID   chlorhexidine  15 mL Mouth Rinse BID   Chlorhexidine Gluconate Cloth  6 each Topical Daily   digoxin  0.25 mg Intravenous Once   diltiazem       diltiazem  60 mg Per Tube Q6H   feeding supplement (PROSource TF)  45 mL Per Tube Daily   folic acid  1 mg Per Tube Daily   free water  30 mL Per Tube Q4H   guaiFENesin  15 mL Per Tube QID   insulin aspart  0-20 Units Subcutaneous TID PC & HS   insulin glargine-yfgn  14 Units Subcutaneous Daily   ipratropium-albuterol  3 mL Nebulization Q6H   levothyroxine  75 mcg Per Tube Q0600   magnesium oxide  400 mg Per Tube BID   nystatin  5 mL Oral QID   rosuvastatin  5 mg Per Tube Q2000   sertraline  25 mg Per Tube Daily   Continuous Infusions:  feeding supplement (OSMOLITE 1.5 CAL) 1,000 mL (07/13/21 1402)     LOS: 3 days    Time spent: 32 minutes    Sharen Hones, MD [facility]   To contact the attending provider between 7A-7P or the covering provider during after hours 7P-7A, please log into the web site [email or web address] and access using universal [place] password for that web site. If you do not have the password, please call the hospital operator.  07/14/2021, 11:12 AM

## 2021-07-14 NOTE — Progress Notes (Signed)
..07/14/2021 4:55 PM  Connor Aguilar [id number]   Temp:  [97.2 F (36.2 C)-98.4 F (36.9 C)] 97.2 F (36.2 C) (12/06 1207) Pulse Rate:  [33-127] 83 (12/06 1400) BP: (98-170)/(57-112) 109/58 (12/06 1400) SpO2:  [86 %-100 %] 94 % (12/06 1400) FiO2 (%):  [28 %-35 %] 35 % (12/06 1438),     Intake/Output Summary (Last 24 hours) at 07/14/2021 1655 Last data filed at 07/14/2021 1208 Gross per 24 hour  Intake 1427.33 ml  Output 960 ml  Net 467.33 ml    Results for orders placed or performed during the hospital encounter of 07/11/21 (from the past 24 hour(s))  Glucose, capillary     Status: Abnormal   Collection Time: 07/13/21  7:35 PM  Result Value Ref Range   Glucose-Capillary 178 (H) 70 - 99 mg/dL  Glucose, capillary     Status: Abnormal   Collection Time: 07/13/21  9:54 PM  Result Value Ref Range   Glucose-Capillary 194 (H) 70 - 99 mg/dL  Basic metabolic panel     Status: Abnormal   Collection Time: 07/14/21  4:59 AM  Result Value Ref Range   Sodium 136 135 - 145 mmol/L   Potassium 4.1 3.5 - 5.1 mmol/L   Chloride 103 98 - 111 mmol/L   CO2 25 22 - 32 mmol/L   Glucose, Bld 306 (H) 70 - 99 mg/dL   BUN 19 8 - 23 mg/dL   Creatinine, Ser 0.60 (L) 0.61 - 1.24 mg/dL   Calcium 8.1 (L) 8.9 - 10.3 mg/dL   GFR, Estimated >60 >60 mL/min   Anion gap 8 5 - 15  Magnesium     Status: None   Collection Time: 07/14/21  4:59 AM  Result Value Ref Range   Magnesium 1.9 1.7 - 2.4 mg/dL  Glucose, capillary     Status: Abnormal   Collection Time: 07/14/21  7:20 AM  Result Value Ref Range   Glucose-Capillary 322 (H) 70 - 99 mg/dL  Glucose, capillary     Status: Abnormal   Collection Time: 07/14/21 11:39 AM  Result Value Ref Range   Glucose-Capillary 160 (H) 70 - 99 mg/dL  Glucose, capillary     Status: Abnormal   Collection Time: 07/14/21  3:56 PM  Result Value Ref Range   Glucose-Capillary 253 (H) 70 - 99 mg/dL    SUBJECTIVE:  Asked to evaluate patient regarding difficulty with  placement of suction catheter down stoma.  Nursing had noticed that gradually had difficulty passing suction over the course of the day.  Patient had large plug removed emergently 2 days ago.  OBJECTIVE:  GEN- NAD supine in bed NECK- stoma with trach collar and humidification, stoma with recurrence of large plug with central hole where catheter was being placed.  Plug removed with hemostat.  TEP in place and after observation by respiratory therapy was noted to have some saliva leaking past inferior aspect of TEP into trachea  IMPRESSION:  s/p laryngectomy s/p removal of mucus plug x2 with quick recurrence and signs of aspiration of saliva from TEP leakage  PLAN:  Patient is at high risk of respiratory compromise due to inability to communicate as well as quick recurrence of large plug.  Urged continued evaluation of tracheostoma for build up and removal prn as crusting is developing.  Continue scheduled use of saline bullets for helping with crusting.  If stomavent sponge is available recommend use of that to help with humidification and potentially decrease the buildup.  Will re-evaluate tomorrow.  Hopefully as lung inflammation and mucus improves, the drianage with improve.  I do not think aspiration of saliva is large part of this but will need TEP chance at [facility] to correct in future.  Connor Aguilar 07/14/2021, 4:55 PM

## 2021-07-14 NOTE — Progress Notes (Signed)
Nutrition Brief Note  Spoke with dietitian at [facility]. Pt's home tube feed regimen consists of nocturnal feeds of Osmolite 1.5@85ml /hr x 14 hrs (from 2000-1000). Pt is NPO at baseline and is unable to eat anything by mouth. Will continue continuous feeds in hospital as pt at high risk for aspiration. Pt currently tolerating tube feeds well at goal rate.   Home regimen provides 1785kcal/day, 75g/day protein and 954ml/day free water  Koleen Distance MS, RD, LDN Please refer to [facility] for RD and/or RD on-call/weekend/after hours pager

## 2021-07-14 NOTE — Progress Notes (Signed)
Patient HR temporarily decreased to 90bpm, Afib post Cardizem but is now back up to 120s. BP soft, 448 systolic. Will continue to monitor.

## 2021-07-14 NOTE — Consult Note (Signed)
Consultation Note Date: 07/14/2021   Patient Name: Connor Aguilar  DOB: 1940/11/28  MRN: [id number]  Age / Sex: 80 y.o., male  PCP: Delsa Grana, PA-C Referring Physician: Sharen Hones, MD  Reason for Consultation: Establishing goals of care and Psychosocial/spiritual support  HPI/Patient Profile: 80 y.o. male  with past medical history of metastatic laryngeal cancer status post laryngectomy/XRT/chemotherapy with tracheostomy and PEG tube, HTN/HLD, DM2, seen at [facility] approximately 3 weeks ago and then sent to short-term rehab at [facility], admitted on 07/11/2021 with metastatic squamous cell carcinoma, acute on chronic respiratory failure with hypoxemia secondary to mucous plug and large pleural effusion.   Clinical Assessment and Goals of Care: I have reviewed medical records including EPIC notes, labs and imaging, received report from RN, assessed the patient.  [name] is lying quietly in bed.  He will make and mostly keep eye contact.  He appears acutely/chronically ill and quite frail with temporal wasting.  Orientation questions not asked, but he can clearly follow directions and nod yes and no appropriately.  I believe that he is able to make his basic needs known.  There is no family at bedside at this time.    We talked about his acute health concerns.  I ask if he feels that he is able to return to [facility] under rehab, but he shakes his head negatively.  I shared that his son [name] has told staff that he would be able to care for him at home if [name] can self toilet.  Again, when asked, Mr. Armijo replies in the negative that he cannot self toilet.  Transition of care team shares that [name] has been at [facility] under private pay.  I share that he can return to [facility] under private pay with hospice care.  I share that hospice "relies on the believe that each of [place] has the right  to die pain-free and with dignity, and that our loved ones receives the necessary support that allows this to happen".  I share that Mr. Breeding would likely also qualify for residential hospice, a facility where he would have round-the-clock care, but to "let nature take its course".  [name] gives no indications of questions at this time.  I encouraged him to consider what this time looks like and feels like, what he does and does not want.  He nods affirmatively when I share I will reach out to his son [name].  I share that I will follow-up with him tomorrow.  Call to son [name] to discuss diagnosis prognosis, [place], EOL wishes, disposition and options.  I introduced Palliative Medicine as specialized medical care for people living with serious illness. It focuses on providing relief from the symptoms and stress of a serious illness. The goal is to improve quality of life for both the patient and the family.  We discussed a brief life review of the patient and then focused on their current illness.  Son and I talked about Mr.  Langworthy's acute and chronic health concerns.  His struggles over the last few weeks and months.  The natural disease trajectory and expectations at EOL were discussed.  I attempted to elicit values and goals of care important to the patient.  [name] shares that he is tired of seeing his father suffer.  The difference between aggressive medical intervention and comfort care was considered in light of the patient's goals of care.  We talk in detail about choices for disposition.  I share my conversation with [name] this morning where he indicated that he could not participate in rehab or basic ADLs at his own home.   Prognosis discussed with permission.  Hospice services outpatient were explained and offered.  We talked about returning to the [facility] under private pay with the benefits of hospice.  We also talked about residential hospice.  Discussed the importance of continued  conversation with family and the medical providers regarding overall plan of care and treatment options, ensuring decisions are within the context of the patient's values and GOCs. Questions and concerns were addressed.  The family was encouraged to call with questions or concerns.  PMT will continue to support holistically.  Conference with attending, bedside nursing staff, transition of care team related to patient condition, needs, goals of care, disposition. PMT to continue to follow.   HCPOA  NEXT OF KIN - son [name] is main contact.      SUMMARY OF RECOMMENDATIONS   At this point continue to treat the treatable Considering options/direction of care  Code Status/Advance Care Planning: Full code  Symptom Management:  Per hospitalist, no additional needs at this time.  Palliative Prophylaxis:  Oral Care and Turn Reposition  Additional Recommendations (Limitations, Scope, Preferences): Full Scope Treatment  Psycho-social/Spiritual:  Desire for further Chaplaincy support:no Additional Recommendations: Caregiving  Support/Resources  Prognosis:  Unable to determine, based on outcomes.  Guarded at this point.  Discharge Planning:  To be determined, based on patient/family choice       Primary Diagnoses: Present on Admission:  Metastatic squamous cell carcinoma to head and neck ([place])  Hyponatremia  Moderate protein-calorie malnutrition ([place])   I have reviewed the medical record, interviewed the patient and family, and examined the patient. The following aspects are pertinent.  Past Medical History:  Diagnosis Date   Allergy    Diabetes mellitus without complication ([place])    Elevated CPK 11/18/2015   Hyperlipidemia    Hypertension    Tobacco abuse    Social History   Socioeconomic History   Marital status: Widowed    Spouse name: Not on file   Number of children: 2   Years of education: Not on file   Highest education level: Not on file  Occupational  History   Not on file  Tobacco Use   Smoking status: Former    Packs/day: 1.50    Years: 64.00    Pack years: 96.00    Types: Cigarettes    Start date: 08/09/1956    Quit date: 08/09/2020    Years since quitting: 0.9   Smokeless tobacco: Never  Vaping Use   Vaping Use: Never used  Substance and Sexual Activity   Alcohol use: No    Alcohol/week: 0.0 standard drinks   Drug use: No   Sexual activity: Never  Other Topics Concern   Not on file  Social History Narrative   Pt wife passed away 10-10-14. Lives between his two sons.    Social Determinants of  Health   Financial Resource Strain: Low Risk    Difficulty of Paying Living Expenses: Not hard at all  Food Insecurity: Not on file  Transportation Needs: No Transportation Needs   Lack of Transportation (Medical): No   Lack of Transportation (Non-Medical): No  Physical Activity: Not on file  Stress: Not on file  Social Connections: Not on file   Family History  Problem Relation Age of Onset   Diabetes Mother    Scheduled Meds:  acetylcysteine  2 mL Nebulization TID   chlorhexidine  15 mL Mouth Rinse BID   Chlorhexidine Gluconate Cloth  6 each Topical Daily   diltiazem       diltiazem  60 mg Per Tube Q6H   enoxaparin (LOVENOX) injection  40 mg Subcutaneous Q24H   feeding supplement (PROSource TF)  45 mL Per Tube Daily   folic acid  1 mg Per Tube Daily   free water  30 mL Per Tube Q4H   guaiFENesin  15 mL Per Tube QID   insulin aspart  0-20 Units Subcutaneous TID PC & HS   insulin glargine-yfgn  14 Units Subcutaneous Daily   ipratropium-albuterol  3 mL Nebulization Q6H   levothyroxine  75 mcg Per Tube Q0600   magnesium oxide  400 mg Per Tube BID   nystatin  5 mL Oral QID   rosuvastatin  5 mg Per Tube Q2000   sertraline  25 mg Per Tube Daily   Continuous Infusions:  amiodarone 60 mg/hr (07/14/21 1208)   Followed by   amiodarone     feeding supplement (OSMOLITE 1.5 CAL) 1,000 mL (07/13/21 1402)   PRN  Meds:.acetaminophen **OR** acetaminophen, magnesium hydroxide, metoprolol tartrate, ondansetron **OR** ondansetron (ZOFRAN) IV, traZODone Medications Prior to Admission:  Prior to Admission medications   Medication Sig Start Date End Date Taking? Authorizing Provider  chlorhexidine (PERIDEX) 0.12 % solution 15 mLs by Mouth Rinse route 2 (two) times daily. 06/17/21  Yes [provider]  folic acid (FOLVITE) 1 MG tablet Give 1 tablet (1 mg total) by G-tube route in the morning. 01/16/21 01/16/22 Yes [provider]  HUMALOG 100 UNIT/ML injection Inject 0-12 Units into the skin 2 (two) times daily. 06/30/21  Yes [provider]  insulin glargine-yfgn (SEMGLEE) 100 UNIT/ML Pen Inject 8 Units into the skin at bedtime. 06/30/21  Yes [provider]  levofloxacin (LEVAQUIN) 750 MG tablet Take 750 mg by mouth daily. 07/03/21  Yes [provider]  levothyroxine (SYNTHROID) 75 MCG tablet Take 1 tablet (75 mcg total) by mouth daily. 03/18/21  Yes Delsa Grana, PA-C  magnesium oxide (MAG-OX) 400 MG tablet Place 1 tablet (400 mg total) into feeding tube daily. Patient taking differently: Place 400 mg into feeding tube 2 (two) times daily. 03/18/21  Yes Delsa Grana, PA-C  metroNIDAZOLE (FLAGYL) 500 MG tablet Take 500 mg by mouth 3 (three) times daily.   Yes [provider]  rosuvastatin (CRESTOR) 5 MG tablet TAKE ONE TABLET EVERY DAY AT BEDTIME 12/01/20  Yes Delsa Grana, PA-C  sertraline (ZOLOFT) 25 MG tablet Take 25 mg by mouth daily. 02/06/21  Yes [provider]  sodium chloride HYPERTONIC 3 % nebulizer solution Take 4 mLs by nebulization every 8 (eight) hours. 06/18/21  Yes [provider]  acetaminophen (TYLENOL) 325 MG tablet Take 2 tablets (650 mg total) by mouth every 6 (six) hours as needed for mild pain (or Fever >/= 101). 07/30/15   Nicholes Mango, MD  blood glucose meter kit and  supplies Accuchek or whatever is covered by insurance; check  sugars once a day only if desired; Dx E11.65, LON 99 months 12/13/17   Lada, Satira Anis, MD  gabapentin (NEURONTIN) 100 MG capsule Take 100 mg by mouth 3 (three) times daily. Patient not taking: Reported on 07/11/2021 10/28/20 10/28/21  [provider]  glucose blood test strip Use as instructed to check sugars once a day if desired; LON 99 months, Dx E11.65 12/13/17   Arnetha Courser, MD  ipratropium-albuterol (DUONEB) 0.5-2.5 (3) MG/3ML SOLN Take 3 mLs by nebulization every 6 (six) hours as needed (wheeze and cough (can do separate from saline)). 05/18/21   Delsa Grana, PA-C  LORazepam (ATIVAN) 0.5 MG tablet Take 1 tablet (0.5 mg total) by mouth every 8 (eight) hours as needed for anxiety (N/V). Patient not taking: Reported on 07/11/2021 03/18/21   Delsa Grana, PA-C  nystatin (MYCOSTATIN) 100000 UNIT/ML suspension  04/14/21   [provider]  Oxycodone HCl 10 MG TABS Take 10 mg by mouth every 4 (four) hours as needed. Patient not taking: Reported on 07/11/2021 11/11/20   [provider]  polyethylene glycol powder (GLYCOLAX/MIRALAX) 17 GM/SCOOP powder Take 17 g by mouth daily as needed.    [provider]  promethazine-dextromethorphan (PROMETHAZINE-DM) 6.25-15 MG/5ML syrup Take 2.5-5 mLs by mouth 3 (three) times daily as needed for cough. Patient not taking: Reported on 07/11/2021 05/18/21   Delsa Grana, PA-C   No Known Allergies Review of Systems  Unable to perform ROS: Other   Physical Exam Vitals and nursing note reviewed.  Constitutional:      General: He is not in acute distress.    Appearance: He is ill-appearing.  HENT:     Head:     Comments: Severe temporal wasting Cardiovascular:     Rate and Rhythm: Normal rate.  Pulmonary:     Effort: Tachypnea present.     Comments: Trach collar at laryngotomy Skin:    General: Skin is warm and dry.  Neurological:     Mental Status: He is alert.     Comments: Unable to ask orientation questions  Psychiatric:      Comments: Calm and cooperative, not fearful    Vital Signs: BP (!) 138/112   Pulse (!) 106   Temp (!) 97.2 F (36.2 C) (Axillary) Comment: pt refuses blankets and nods that he is hot  Resp (!) 26   Ht '5\' 8"'  (1.727 m)   Wt 56.7 kg   SpO2 93%   BMI 19.01 kg/m  Pain Scale: 0-10   Pain Score: 0-No pain   SpO2: SpO2: 93 % O2 Device:SpO2: 93 % O2 Flow Rate: .O2 Flow Rate (L/min): 8 L/min  IO: Intake/output summary:  Intake/Output Summary (Last 24 hours) at 07/14/2021 1226 Last data filed at 07/14/2021 1208 Gross per 24 hour  Intake 1702.33 ml  Output 961 ml  Net 741.33 ml    LBM: Last BM Date: 07/13/21 Baseline Weight: Weight: 56.7 kg Most recent weight: Weight: 56.7 kg     Palliative Assessment/Data:   Flowsheet Rows    Flowsheet Row Most Recent Value  Intake Tab   Referral Department Hospitalist  Unit at Time of Referral Intermediate Care Unit  Palliative Care Primary Diagnosis Pulmonary  Date Notified 07/13/21  Palliative Care Type New Palliative care  Reason for referral Clarify Goals of Care  Date of Admission 07/11/21  Date first seen by Palliative Care 07/14/21  # of days Palliative referral response time 1 Day(s)  #  of days IP prior to Palliative referral 2  Clinical Assessment   Palliative Performance Scale Score 30%  Pain Max last 24 hours Not able to report  Pain Min Last 24 hours Not able to report  Dyspnea Max Last 24 Hours Not able to report  Dyspnea Min Last 24 hours Not able to report  Psychosocial & Spiritual Assessment   Palliative Care Outcomes        Time In: 1130 Time Out: 1240 Time Total: 70 minutes  Greater than 50%  of this time was spent counseling and coordinating care related to the above assessment and plan.  Signed by: Drue Novel, NP   Please contact Palliative Medicine Team phone at [phone] for questions and concerns.  For individual provider: See Shea Evans

## 2021-07-15 DIAGNOSIS — I480 Paroxysmal atrial fibrillation: Secondary | ICD-10-CM

## 2021-07-15 DIAGNOSIS — J9621 Acute and chronic respiratory failure with hypoxia: Secondary | ICD-10-CM | POA: Diagnosis not present

## 2021-07-15 DIAGNOSIS — R531 Weakness: Secondary | ICD-10-CM

## 2021-07-15 DIAGNOSIS — Z7189 Other specified counseling: Secondary | ICD-10-CM | POA: Diagnosis not present

## 2021-07-15 DIAGNOSIS — E43 Unspecified severe protein-calorie malnutrition: Secondary | ICD-10-CM

## 2021-07-15 DIAGNOSIS — J90 Pleural effusion, not elsewhere classified: Secondary | ICD-10-CM | POA: Diagnosis not present

## 2021-07-15 DIAGNOSIS — Z515 Encounter for palliative care: Secondary | ICD-10-CM | POA: Diagnosis not present

## 2021-07-15 DIAGNOSIS — Z794 Long term (current) use of insulin: Secondary | ICD-10-CM

## 2021-07-15 DIAGNOSIS — E1165 Type 2 diabetes mellitus with hyperglycemia: Secondary | ICD-10-CM | POA: Diagnosis not present

## 2021-07-15 DIAGNOSIS — C7989 Secondary malignant neoplasm of other specified sites: Secondary | ICD-10-CM | POA: Diagnosis not present

## 2021-07-15 LAB — GLUCOSE, CAPILLARY: Glucose-Capillary: 296 mg/dL — ABNORMAL HIGH (ref 70–99)

## 2021-07-15 MED ORDER — LORAZEPAM 2 MG/ML IJ SOLN: 1.0000 mg | INTRAMUSCULAR | 0 refills | Status: AC | PRN

## 2021-07-15 MED ORDER — HALOPERIDOL 0.5 MG PO TABS
0.5000 mg | ORAL_TABLET | ORAL | Status: DC | PRN
  Filled 2021-07-15: qty 1

## 2021-07-15 MED ORDER — MORPHINE SULFATE (PF) 2 MG/ML IV SOLN
1.0000 mg | INTRAVENOUS | Status: DC | PRN
Start: 2021-07-15 — End: 2021-07-16
  Administered 2021-07-15: 2 mg via INTRAVENOUS
  Filled 2021-07-15: qty 1

## 2021-07-15 MED ORDER — GLYCOPYRROLATE 1 MG PO TABS
1.0000 mg | ORAL_TABLET | ORAL | Status: DC | PRN
  Filled 2021-07-15: qty 1

## 2021-07-15 MED ORDER — GLYCOPYRROLATE 0.2 MG/ML IJ SOLN: 0.2000 mg | INTRAMUSCULAR | Status: DC | PRN

## 2021-07-15 MED ORDER — POLYVINYL ALCOHOL 1.4 % OP SOLN
1.0000 [drp] | Freq: Four times a day (QID) | OPHTHALMIC | 0 refills | Status: AC | PRN
Start: 2021-07-15 — End: ?

## 2021-07-15 MED ORDER — GLYCOPYRROLATE 0.2 MG/ML IJ SOLN: 0.2000 mg | INTRAMUSCULAR | Status: AC | PRN

## 2021-07-15 MED ORDER — ONDANSETRON HCL 4 MG/2ML IJ SOLN
4.0000 mg | Freq: Four times a day (QID) | INTRAMUSCULAR | 0 refills | Status: AC | PRN
Start: 2021-07-15 — End: ?

## 2021-07-15 MED ORDER — LORAZEPAM 2 MG/ML IJ SOLN: 1.0000 mg | INTRAMUSCULAR | Status: DC | PRN

## 2021-07-15 MED ORDER — ACETAMINOPHEN 325 MG PO TABS: 650.0000 mg | ORAL_TABLET | Freq: Four times a day (QID) | ORAL | Status: DC | PRN

## 2021-07-15 MED ORDER — ACETAMINOPHEN 650 MG RE SUPP: 650.0000 mg | Freq: Four times a day (QID) | RECTAL | Status: DC | PRN

## 2021-07-15 MED ORDER — ONDANSETRON HCL 4 MG/2ML IJ SOLN: 4.0000 mg | Freq: Four times a day (QID) | INTRAMUSCULAR | Status: DC | PRN

## 2021-07-15 MED ORDER — ACETAMINOPHEN 325 MG PO TABS: 650.0000 mg | ORAL_TABLET | Freq: Four times a day (QID) | ORAL | Status: AC | PRN

## 2021-07-15 MED ORDER — POLYVINYL ALCOHOL 1.4 % OP SOLN
1.0000 [drp] | Freq: Four times a day (QID) | OPHTHALMIC | Status: DC | PRN
  Filled 2021-07-15: qty 15

## 2021-07-15 MED ORDER — MORPHINE SULFATE (PF) 2 MG/ML IV SOLN
1.0000 mg | INTRAVENOUS | 0 refills | Status: AC | PRN
Start: 2021-07-15 — End: ?

## 2021-07-15 MED ORDER — HALOPERIDOL LACTATE 5 MG/ML IJ SOLN
0.5000 mg | INTRAMUSCULAR | Status: AC | PRN
Start: 2021-07-15 — End: ?

## 2021-07-15 MED ORDER — LORAZEPAM 1 MG PO TABS: 1.0000 mg | ORAL_TABLET | ORAL | Status: DC | PRN

## 2021-07-15 MED ORDER — ONDANSETRON 4 MG PO TBDP
4.0000 mg | ORAL_TABLET | Freq: Four times a day (QID) | ORAL | Status: DC | PRN
  Filled 2021-07-15: qty 1

## 2021-07-15 MED ORDER — HALOPERIDOL LACTATE 2 MG/ML PO CONC
0.5000 mg | ORAL | Status: DC | PRN
  Filled 2021-07-15: qty 0.3

## 2021-07-15 MED ORDER — BIOTENE DRY MOUTH MT LIQD: 15.0000 mL | OROMUCOSAL | Status: DC | PRN

## 2021-07-15 MED ORDER — HALOPERIDOL LACTATE 5 MG/ML IJ SOLN: 0.5000 mg | INTRAMUSCULAR | Status: DC | PRN

## 2021-07-15 MED ORDER — LORAZEPAM 2 MG/ML PO CONC: 1.0000 mg | ORAL | Status: DC | PRN

## 2021-07-15 NOTE — Progress Notes (Signed)
Patient transferred to [facility] via EMS on trach coller 10L/35%. Patient son [name] notified about patient transfer. Patient care relinquished.

## 2021-07-15 NOTE — Progress Notes (Signed)
Patient ID: Connor Aguilar, male   DOB: 04/05/1941, 80 y.o.   MRN: [id number] [facility] PROGRESS NOTE  Connor Aguilar BWG:[id number] DOB: Jul 09, 1941 DOA: 07/11/2021 PCP: Delsa Grana, PA-C  HPI/Subjective: Patient able to shake his head to yes or no questions.  Some shortness of breath and some cough.  No chest pain.  No abdominal pain.  Admitted with shortness of breath and hypoxia and repeated mucous plugging.  Objective: Vitals:   07/15/21 0800 07/15/21 0900  BP: 112/62 (!) 109/52  Pulse: 72 69  Resp:    Temp:  98 F (36.7 C)  SpO2: 93% 93%    Intake/Output Summary (Last 24 hours) at 07/15/2021 1016 Last data filed at 07/15/2021 1000 Gross per 24 hour  Intake 1620.47 ml  Output 1100 ml  Net 520.47 ml   Filed Weights   07/11/21 1736  Weight: 56.7 kg    ROS: Review of Systems  Respiratory:  Positive for cough and shortness of breath.   Cardiovascular:  Negative for chest pain.  Gastrointestinal:  Negative for abdominal pain.  Exam: Physical Exam HENT:     Head: Normocephalic.     Mouth/Throat:     Pharynx: No oropharyngeal exudate.  Eyes:     General: Lids are normal.     Conjunctiva/sclera: Conjunctivae normal.  Cardiovascular:     Rate and Rhythm: Normal rate and regular rhythm.     Heart sounds: Normal heart sounds, S1 normal and S2 normal.  Pulmonary:     Breath sounds: Examination of the right-lower field reveals decreased breath sounds and rhonchi. Examination of the left-lower field reveals decreased breath sounds and rhonchi. Decreased breath sounds and rhonchi present. No wheezing or rales.  Abdominal:     Palpations: Abdomen is soft.     Tenderness: There is no abdominal tenderness.  Musculoskeletal:     Right lower leg: No swelling.     Left lower leg: No swelling.  Skin:    General: Skin is warm.     Findings: No rash.  Neurological:     Mental Status: He is alert.     Comments: Able to shake head yes or no questions.  Able to straight leg  raise      Scheduled Meds:  acetylcysteine  2 mL Nebulization TID   amiodarone  200 mg Per Tube BID   chlorhexidine  15 mL Mouth Rinse BID   Chlorhexidine Gluconate Cloth  6 each Topical Daily   diltiazem  60 mg Per Tube Q6H   enoxaparin (LOVENOX) injection  40 mg Subcutaneous Q24H   feeding supplement (PROSource TF)  45 mL Per Tube Daily   folic acid  1 mg Per Tube Daily   free water  30 mL Per Tube Q4H   guaiFENesin  15 mL Per Tube QID   insulin aspart  0-20 Units Subcutaneous TID PC & HS   insulin glargine-yfgn  14 Units Subcutaneous Daily   ipratropium-albuterol  3 mL Nebulization Q6H   levothyroxine  75 mcg Per Tube Q0600   magnesium oxide  400 mg Per Tube BID   nystatin  5 mL Oral QID   rosuvastatin  5 mg Per Tube Q2000   sertraline  25 mg Per Tube Daily   Continuous Infusions:  feeding supplement (OSMOLITE 1.5 CAL) 1,000 mL (07/15/21 0319)    Assessment/Plan:  Atrial fibrillation with rapid ventricular response.  Patient on diltiazem and amiodarone.  Holding on anticoagulation for now.  Heart rate better controlled today. Acute on chronic  respiratory failure with hypoxemia secondary to repeated mucous plugging and large pleural effusion.  ENT to reevaluate patient today.  Palliative care to meet with family today.  History of metastatic laryngeal cancer Right pleural effusion status post removal of 1 L of pleural fluid on 07/13/2021 Sepsis ruled out Type 2 diabetes mellitus with hyperglycemia.  Hemoglobin A1c elevated 9.0.  On glargine insulin and sliding scale Nutrition on tube feeds CT scan showing possible fistula between esophagus and trachea which will likely lead to repeated aspirations. Depression on Zoloft Weakness.  Physical therapy recommending going back to skilled Severe protein calorie malnutrition Family to meet with palliative care team today    Code Status:     Code Status Orders  (From admission, onward)           Start     Ordered    07/11/21 1957  Full code  Continuous        07/11/21 2003           Code Status History     Date Active Date Inactive Code Status Order ID Comments User Context   07/27/2015 1500 07/30/2015 1526 Full Code [id number]  Dustin Flock, MD Inpatient      Family Communication: Spoke with family at the bedside Disposition Plan: Status is: Inpatient  Consultants: ENT Palliative care  Time spent: 28 minutes  [place]

## 2021-07-15 NOTE — Progress Notes (Signed)
Patient alert, follows commands. He is on trach collar. Mucus plug this am. Morphine given for patient comfort. Prima fit on, peg tube intact. Family wishes for patient transfer to hospice home. Report given to Santiago Glad.

## 2021-07-15 NOTE — Progress Notes (Signed)
[facility]  [facility]) Hospital Liaison Note  Received request from [place] for family interest in [place] with request for transfer today. Chart reviewed and eligibility confirmed. Spoke with patient's son [name] to confirm interest and explain services. Family agreeable to transfer today. TOC aware. Unit RN please call report to [phone] prior to patient leaving the unit.   TOC please arrange transport once confirmation has been confirmed that consents are complete. Call with any questions or concerns. Thank you  Roselee Nova, [facility] Liaison  [phone]

## 2021-07-15 NOTE — Progress Notes (Signed)
Occupational Therapy Treatment Patient Details Name: Connor Aguilar MRN: [id number] DOB: [birth year] Today's Date: 07/15/2021   History of present illness Pt is an 80 y/o M admitted on 07/11/21 with c/c of progressive SOB. Pt is being treated for acute on chronic respiratory failure with hypoxemia 2/2 mucous plug & large pleural effusion. PMH: laryngeal CA s/p total laryngectomy & neck dissections in 01/22, g-tube, DM, HLD, HTN, tobacco abuse   OT comments  Connor Aguilar was seen for OT treatment on this date. Upon arrival to room pt reclined in bed, agreeable to bed level tx. Pt requires MAX A oral suctioniong and face washing at bed level. Pt tolerated bed level BUE therex as described below. Pt goals and frequency adjusted 2/2 medical status limiting participation. Pt continues to benefit from skilled OT services to maximize return to PLOF and minimize risk of future falls, injury, caregiver burden, and readmission. Will continue to follow POC. Discharge recommendation remains appropriate.     Recommendations for follow up therapy are one component of a multi-disciplinary discharge planning process, led by the attending physician.  Recommendations may be updated based on patient status, additional functional criteria and insurance authorization.    Follow Up Recommendations  Skilled nursing-short term rehab (<3 hours/day)    Assistance Recommended at Discharge Frequent or constant Supervision/Assistance  Equipment Recommendations  Other (comment) (defer to next venue of care)    Recommendations for Other Services      Precautions / Restrictions Precautions Precautions: Fall Restrictions Weight Bearing Restrictions: No       Mobility Bed Mobility               General bed mobility comments: MOD A adjust torso/head position in bed, deferred further mobility                           ADL either performed or assessed with clinical judgement   ADL Overall ADL's : Needs  assistance/impaired                                       General ADL Comments: MAX A oral suctioniong and face washing at bed level.      Cognition Arousal/Alertness: Awake/alert;Lethargic Behavior During Therapy: Flat affect Overall Cognitive Status: Impaired/Different from baseline Area of Impairment: Following commands;Safety/judgement                       Following Commands: Follows one step commands consistently;Follows one step commands with increased time Safety/Judgement: Decreased awareness of safety;Decreased awareness of deficits                Exercises Exercises: Other exercises;General Upper Extremity General Exercises - Upper Extremity Shoulder Flexion: AAROM;Strengthening;Both;10 reps;Supine Shoulder Extension: AAROM;Strengthening;Both;10 reps;Supine Shoulder ABduction: AAROM;Strengthening;Both;10 reps;Supine Shoulder ADduction: AAROM;Strengthening;Both;10 reps;Supine Elbow Flexion: AAROM;Strengthening;Both;10 reps;Supine Elbow Extension: AAROM;Strengthening;Both;10 reps;Supine Wrist Flexion: AAROM;Strengthening;Both;10 reps;Supine Wrist Extension: AAROM;Strengthening;Both;10 reps;Supine Digit Composite Flexion: AAROM;Strengthening;Both;10 reps;Supine Composite Extension: AAROM;Strengthening;Both;10 reps;Supine Other Exercises Other Exercises: Pt educacted re: OT role, bed mobility, importance of movement for functional mobility Other Exercises: bed level exercises, face wahsing, oral suctionoing           Pertinent Vitals/ Pain       Pain Assessment: Faces Pain Score: 0-No pain Faces Pain Scale: No hurt Breathing: normal Negative Vocalization: none Facial Expression: smiling or inexpressive Body Language: relaxed Consolability: no need to console  PAINAD Score: 0   Frequency  Min 2X/week        Progress Toward Goals  OT Goals(current goals can now be found in the care plan section)  Progress towards OT goals:  Goals drowngraded-see care plan  Acute Rehab OT Goals Patient Stated Goal: to wash face OT Goal Formulation: With family Time For Goal Achievement: 07/27/21 Potential to Achieve Goals: Good  Plan Discharge plan remains appropriate;Frequency needs to be updated    Co-evaluation                 AM-PAC OT "6 Clicks" Daily Activity     Outcome Measure   Help from another person eating meals?: Total Help from another person taking care of personal grooming?: A Lot Help from another person toileting, which includes using toliet, bedpan, or urinal?: A Lot Help from another person bathing (including washing, rinsing, drying)?: A Lot Help from another person to put on and taking off regular upper body clothing?: A Lot Help from another person to put on and taking off regular lower body clothing?: A Lot 6 Click Score: 11    End of Session Equipment Utilized During Treatment: Oxygen  OT Visit Diagnosis: Unsteadiness on feet (R26.81);Other abnormalities of gait and mobility (R26.89);Muscle weakness (generalized) (M62.81)   Activity Tolerance Patient limited by fatigue   Patient Left in bed;with call bell/phone within reach   Nurse Communication          Time: 3419-6222 OT Time Calculation (min): 14 min  Charges: OT General Charges $OT Visit: 1 Visit OT Treatments $Therapeutic Exercise: 8-22 mins  Dessie Coma, M.S. OTR/L  07/15/21, 11:30 AM  ascom [phone]

## 2021-07-15 NOTE — Progress Notes (Signed)
Palliative: Connor Aguilar is resting quietly in bed.  He appears acutely/chronically ill and quite frail.  He will open his eyes, making and somewhat keeping eye contact.  He is able to nod yes and no for basic needs.    Call to son, Connor Aguilar.  Connor Aguilar shares that he is spoken with his father about hospice care.  Connor Aguilar shares that he asked his father if he was ready to go see his mother and Connor Aguilar nodded affirmatively.  We talk in detail about what is and is not provided at residential hospice.  Connor Aguilar is agreeable to stop tube feeding.  Connor Aguilar shares that they would request that he have as needed suctioning for mucous plug of laryngotomy.    Prognosis discussed with permission.  Family is agreeable to no tube feeding, comfort medicines only.  Orders adjusted.  End-of-life order set implemented.  Conference with attending, bedside nursing staff, hospice representative, transition of care team related to patient condition, needs, goals of care, disposition.  Plan:   Comfort and dignity at end-of-life, risk requesting residential hospice with [facility]. Prognosis: 5 to 10 days would be expected  45 minutes  [name], NP Palliative medicine team Team phone [phone] Greater than 50% of this time was spent counseling and coordinating care related to the above assessment and plan.

## 2021-07-15 NOTE — Discharge Summary (Signed)
[place] [facility] [place] NAME: Connor Aguilar    MR#:  [id number]  DATE OF BIRTH:  1941/06/14  DATE OF ADMISSION:  07/11/2021 ADMITTING PHYSICIAN: Christel Mormon, MD  DATE OF DISCHARGE to hospice home: 07/15/2021  PRIMARY CARE PHYSICIAN: Delsa Grana, PA-C    ADMISSION DIAGNOSIS:  SOB (shortness of breath) [R06.02] Pleural effusion [J90] Hypoxia [R09.02] HCAP (healthcare-associated pneumonia) [J18.9]  DISCHARGE DIAGNOSIS:  Active Problems:   Type 2 diabetes mellitus with hyperglycemia, with long-term current use of insulin (HCC)   Metastatic squamous cell carcinoma to head and neck (HCC)   Moderate protein-calorie malnutrition (HCC)   Weakness   Hyponatremia   Acute on chronic respiratory failure with hypoxia (HCC)   Uncontrolled type 2 diabetes mellitus with hyperglycemia (HCC)   Pleural effusion   Mucus plug in respiratory tract   Protein-calorie malnutrition, severe   AF (paroxysmal atrial fibrillation) ([place])   Hospice care patient   SECONDARY DIAGNOSIS:   Past Medical History:  Diagnosis Date   Allergy    Diabetes mellitus without complication ([place])    Elevated CPK 11/18/2015   Hyperlipidemia    Hypertension    Tobacco abuse     HOSPITAL COURSE:   1.  Acute on chronic respiratory failure with hypoxemia secondary to repeated mucous plugging.  History of metastatic laryngeal cancer currently on trach collar 35% oxygen.  Seen by Dr. Pryor Ochoa ENT.  Patient with persistent mucous plugging and signs of aspiration of saliva from TEP leakage.  Overall prognosis poor.  Palliative care saw the patient and discussed with family and they have decided on hospice home and comfort care measures for end-of-life care.  As needed medications ordered. 2.  Paroxysmal atrial fibrillation with rapid ventricular response.  With comfort care orders these medications were discontinued 3.  Right pleural effusion status post removal of 1 L of pleural fluid  on 07/13/2021 4.  Sepsis ruled out 5.  Type 2 diabetes mellitus with hyperglycemia.  Hemoglobin A1c elevated 9.0. 6.  Possible fistula between esophagus and trachea which will likely lead to repeat aspirations 7.  Depression 8.  Weakness 9.  Severe protein calorie malnutrition  DISCHARGE CONDITIONS:   Guarded  CONSULTS OBTAINED:  ENT Palliative care  DRUG ALLERGIES:  No Known Allergies  DISCHARGE MEDICATIONS:   Allergies as of 07/15/2021   No Known Allergies      Medication List     STOP taking these medications    blood glucose meter kit and supplies   chlorhexidine 0.12 % solution Commonly known as: PERIDEX   folic acid 1 MG tablet Commonly known as: FOLVITE   gabapentin 100 MG capsule Commonly known as: NEURONTIN   glucose blood test strip   HumaLOG 100 UNIT/ML injection Generic drug: insulin lispro   insulin glargine-yfgn 100 UNIT/ML Pen Commonly known as: SEMGLEE   levofloxacin 750 MG tablet Commonly known as: LEVAQUIN   levothyroxine 75 MCG tablet Commonly known as: SYNTHROID   LORazepam 0.5 MG tablet Commonly known as: ATIVAN Replaced by: LORazepam 2 MG/ML injection   magnesium oxide 400 MG tablet Commonly known as: MAG-OX   metroNIDAZOLE 500 MG tablet Commonly known as: FLAGYL   nystatin 100000 UNIT/ML suspension Commonly known as: MYCOSTATIN   Oxycodone HCl 10 MG Tabs   polyethylene glycol powder 17 GM/SCOOP powder Commonly known as: GLYCOLAX/MIRALAX   promethazine-dextromethorphan 6.25-15 MG/5ML syrup Commonly known as: PROMETHAZINE-DM   rosuvastatin 5 MG tablet Commonly known as: CRESTOR   sertraline 25  MG tablet Commonly known as: ZOLOFT   sodium chloride HYPERTONIC 3 % nebulizer solution       TAKE these medications    acetaminophen 325 MG tablet Commonly known as: TYLENOL Take 2 tablets (650 mg total) by mouth every 6 (six) hours as needed for mild pain (or Fever >/= 101).   glycopyrrolate 0.2 MG/ML  injection Commonly known as: ROBINUL Inject 1 mL (0.2 mg total) into the vein every 4 (four) hours as needed (excessive secretions).   haloperidol lactate 5 MG/ML injection Commonly known as: HALDOL Inject 0.1 mLs (0.5 mg total) into the vein every 4 (four) hours as needed (or delirium).   ipratropium-albuterol 0.5-2.5 (3) MG/3ML Soln Commonly known as: DUONEB Take 3 mLs by nebulization every 6 (six) hours as needed (wheeze and cough (can do separate from saline)).   LORazepam 2 MG/ML injection Commonly known as: ATIVAN Inject 0.5 mLs (1 mg total) into the vein every 4 (four) hours as needed for anxiety. Replaces: LORazepam 0.5 MG tablet   morphine 2 MG/ML injection Inject 0.5-1 mLs (1-2 mg total) into the vein every hour as needed (increased WOB/RR, EOL care).   ondansetron 4 MG/2ML Soln injection Commonly known as: ZOFRAN Inject 2 mLs (4 mg total) into the vein every 6 (six) hours as needed for nausea.   polyvinyl alcohol 1.4 % ophthalmic solution Commonly known as: LIQUIFILM TEARS Place 1 drop into both eyes 4 (four) times daily as needed for dry eyes.         DISCHARGE INSTRUCTIONS:   Follow-up at the hospice home 1 day  If you experience worsening of your admission symptoms, develop shortness of breath, life threatening emergency, suicidal or homicidal thoughts you must seek medical attention immediately by calling 911 or calling your MD immediately  if symptoms less severe.  You Must read complete instructions/literature along with all the possible adverse reactions/side effects for all the Medicines you take and that have been prescribed to you. Take any new Medicines after you have completely understood and accept all the possible adverse reactions/side effects.   Please note  You were cared for by a hospitalist during your hospital stay. If you have any questions about your discharge medications or the care you received while you were in the hospital after you are  discharged, you can call the unit and asked to speak with the hospitalist on call if the hospitalist that took care of you is not available. Once you are discharged, your primary care physician will handle any further medical issues. Please note that NO REFILLS for any discharge medications will be authorized once you are discharged, as it is imperative that you return to your primary care physician (or establish a relationship with a primary care physician if you do not have one) for your aftercare needs so that they can reassess your need for medications and monitor your lab values.    Today   CHIEF COMPLAINT:   Chief Complaint  Patient presents with   Shortness of Breath    HISTORY OF PRESENT ILLNESS:  Connor Aguilar  is a 80 y.o. male came in with shortness of breath   VITAL SIGNS:  Blood pressure 122/63, pulse 71, temperature 98.5 F (36.9 C), resp. rate (!) 26, height _0  (1.727 m), weight 56.7 kg, SpO2 94 %.  I/O:   Intake/Output Summary (Last 24 hours) at 07/15/2021 1418 Last data filed at 07/15/2021 1000 Gross per 24 hour  Intake 1245.7 ml  Output 1100 ml  Net 145.7 ml     DATA REVIEW:   CBC Recent Labs  Lab 07/13/21 0518  WBC 7.1  HGB 12.1*  HCT 36.9*  PLT 275    Chemistries  Recent Labs  Lab 07/11/21 1731 07/11/21 2023 07/14/21 0459  NA 131*   < > 136  K 4.5   < > 4.1  CL 96*   < > 103  CO2 24   < > 25  GLUCOSE 328*   < > 306*  BUN 32*   < > 19  CREATININE 0.78   < > 0.60*  CALCIUM 8.8*   < > 8.1*  MG  --    < > 1.9  AST 22  --   --   ALT 18  --   --   ALKPHOS 69  --   --   BILITOT 0.8  --   --    < > = values in this interval not displayed.     Microbiology Results  Results for orders placed or performed during the hospital encounter of 07/11/21  Resp Panel by RT-PCR (Flu A&B, Covid) Nasopharyngeal Swab     Status: None   Collection Time: 07/11/21  5:31 PM   Specimen: Nasopharyngeal Swab; Nasopharyngeal(NP) swabs in vial transport  medium  Result Value Ref Range Status   SARS Coronavirus 2 by RT PCR NEGATIVE NEGATIVE Final    Comment: (NOTE) SARS-CoV-2 target nucleic acids are NOT DETECTED.  The SARS-CoV-2 RNA is generally detectable in upper respiratory specimens during the acute phase of infection. The lowest concentration of SARS-CoV-2 viral copies this assay can detect is 138 copies/mL. A negative result does not preclude SARS-Cov-2 infection and should not be used as the sole basis for treatment or other patient management decisions. A negative result may occur with  improper specimen collection/handling, submission of specimen other than nasopharyngeal swab, presence of viral mutation(s) within the areas targeted by this assay, and inadequate number of viral copies(<138 copies/mL). A negative result must be combined with clinical observations, patient history, and epidemiological information. The expected result is Negative.  Fact Sheet for Patients:  EntrepreneurPulse.com.au  Fact Sheet for Healthcare Providers:  IncredibleEmployment.be  This test is no t yet approved or cleared by the [place] FDA and  has been authorized for detection and/or diagnosis of SARS-CoV-2 by FDA under an Emergency Use Authorization (EUA). This EUA will remain  in effect (meaning this test can be used) for the duration of the COVID-19 declaration under Section 564(b)(1) of the Act, 21 U.S.C.section 360bbb-3(b)(1), unless the authorization is terminated  or revoked sooner.       Influenza A by PCR NEGATIVE NEGATIVE Final   Influenza B by PCR NEGATIVE NEGATIVE Final    Comment: (NOTE) The Xpert Xpress SARS-CoV-2/FLU/RSV plus assay is intended as an aid in the diagnosis of influenza from Nasopharyngeal swab specimens and should not be used as a sole basis for treatment. Nasal washings and aspirates are unacceptable for Xpert Xpress SARS-CoV-2/FLU/RSV testing.  Fact Sheet for  Patients: EntrepreneurPulse.com.au  Fact Sheet for Healthcare Providers: IncredibleEmployment.be  This test is not yet approved or cleared by the [place] FDA and has been authorized for detection and/or diagnosis of SARS-CoV-2 by FDA under an Emergency Use Authorization (EUA). This EUA will remain in effect (meaning this test can be used) for the duration of the COVID-19 declaration under Section 564(b)(1) of the Act, 21 U.S.C. section 360bbb-3(b)(1), unless the authorization is terminated or revoked.  Performed at  [facility], [address] [place], [place] [address]   Blood Culture (routine x 2)     Status: None (Preliminary result)   Collection Time: 07/11/21  5:34 PM   Specimen: BLOOD  Result Value Ref Range Status   Specimen Description   Final    BLOOD Blood Culture results may not be optimal due to an inadequate volume of blood received in culture bottles   Special Requests   Final    BOTTLES DRAWN AEROBIC AND ANAEROBIC LEFT ANTECUBITAL   Culture   Final    NO GROWTH 4 DAYS Performed at [facility], [address], [place], [place] [address]    Report Status PENDING  Incomplete  Blood Culture (routine x 2)     Status: None (Preliminary result)   Collection Time: 07/11/21  5:44 PM   Specimen: BLOOD  Result Value Ref Range Status   Specimen Description   Final    BLOOD Blood Culture results may not be optimal due to an inadequate volume of blood received in culture bottles   Special Requests   Final    BOTTLES DRAWN AEROBIC AND ANAEROBIC LEFT ANTECUBITAL   Culture   Final    NO GROWTH 4 DAYS Performed at [facility], [address], [place], [place] [address]    Report Status PENDING  Incomplete  Urine Culture     Status: None   Collection Time: 07/11/21  8:23 PM   Specimen: In/Out Cath Urine  Result Value Ref Range Status   Specimen Description   Final    IN/OUT CATH URINE Performed at  [facility], [address], [place], [place] [address]    Special Requests   Final    NONE Performed at [facility], [address], [place], [place] [address]    Culture   Final    NO GROWTH Performed at [facility], [place] [address], [place], [place] [address]    Report Status 07/13/2021 FINAL  Final  MRSA Next Gen by PCR, Nasal     Status: None   Collection Time: 07/12/21  5:03 AM   Specimen: Nasal Mucosa; Nasal Swab  Result Value Ref Range Status   MRSA by PCR Next Gen NOT DETECTED NOT DETECTED Final    Comment: (NOTE) The GeneXpert MRSA Assay (FDA approved for NASAL specimens only), is one component of a comprehensive MRSA colonization surveillance program. It is not intended to diagnose MRSA infection nor to guide or monitor treatment for MRSA infections. Test performance is not FDA approved in patients less than 28 years old. Performed at [facility], [address], [place], [place] [address]   Fungus Culture With Stain     Status: None (Preliminary result)   Collection Time: 07/13/21 12:00 PM   Specimen: Lung, Right; Pleural Fluid  Result Value Ref Range Status   Fungus Stain Final report  Final    Comment: (NOTE) Performed At: [facility] [address] [place], [place] [id number] Rush Farmer MD LE:[id number]    Fungus (Mycology) Culture PENDING  Incomplete   Fungal Source PLEURAL  Final    Comment: Performed at [facility], [place] [place],  [address]  Body fluid culture w Gram Stain     Status: None (Preliminary result)   Collection Time: 07/13/21 12:00 PM   Specimen: Lung, Right; Pleural Fluid  Result Value Ref Range Status   Specimen Description   Final    PLEURAL Performed  at [facility], [address], [place], [place] [address]    Special Requests   Final    NONE Performed at [facility], [address] [place], [place] [address]    Gram Stain   Final     RARE WBC PRESENT, PREDOMINANTLY MONONUCLEAR NO ORGANISMS SEEN    Culture   Final    NO GROWTH 2 DAYS Performed at [facility], [place] [address], [place], [place] [address]    Report Status PENDING  Incomplete  Fungus Culture Result     Status: None   Collection Time: 07/13/21 12:00 PM  Result Value Ref Range Status   Result 1 Comment  Final    Comment: (NOTE) KOH/Calcofluor preparation:  no fungus observed. Performed At: [facility] [place], [place] [id number] Rush Farmer MD YB:[id number]     RADIOLOGY:  DG Chest Port 1 View  Result Date: 07/13/2021 CLINICAL DATA:  Post thoracentesis EXAM: PORTABLE CHEST 1 VIEW COMPARISON:  07/11/2021 radiograph and chest CT FINDINGS: Decreased right-sided pleural effusion post thoracentesis. No definitive pneumothorax is seen. Residual moderate bilateral effusions stable cardiomediastinal silhouette with vascular congestion and probable pulmonary edema. Airspace disease in the lower lungs with consolidation, atelectasis versus pneumonia. Masslike fullness in the right hilar region. IMPRESSION: 1. Slight decreased right pleural effusion post thoracentesis. No pneumothorax. There are moderate residual bilateral effusions 2. Stable cardiomediastinal silhouette with vascular congestion and pulmonary edema. Slight masslike enlargement of right hilar region, attention on follow-up imaging. 3. Lower lung airspace disease could reflect atelectasis or pneumonia Electronically Signed   By: Donavan Foil M.D.   On: 07/13/2021 15:53      Management plans discussed with the patient, family and they are in agreement.  CODE STATUS:     Code Status Orders  (From admission, onward)           Start     Ordered   07/15/21 1200  Do not attempt resuscitation (DNR)  Continuous       Question Answer Comment  In the event of cardiac or respiratory ARREST Do not call a "code blue"   In the event of cardiac or respiratory ARREST  Do not perform Intubation, CPR, defibrillation or ACLS   In the event of cardiac or respiratory ARREST Use medication by any route, position, wound care, and other measures to relive pain and suffering. May use oxygen, suction and manual treatment of airway obstruction as needed for comfort.      07/15/21 1200           Code Status History     Date Active Date Inactive Code Status Order ID Comments User Context   07/11/2021 2003 07/15/2021 1200 Full Code [id number]  Sidney Ace Arvella Merles, MD ED   07/27/2015 1500 07/30/2015 1526 Full Code [id number]  Dustin Flock, MD Inpatient       TOTAL TIME TAKING CARE OF THIS PATIENT: Total time today 35 minutes.    Loletha Grayer M.D on 07/15/2021 at 2:18 PM    [facility]  CC: Primary care physician; Delsa Grana, PA-C

## 2021-07-15 NOTE — Progress Notes (Signed)
Patient's RN and I were able to remove 3.5 cm mucus plug from stoma with tweezers, that could be seen from opening of stoma.  Patient  tolerated well and nods head that breathing better now.  HR 72 RR 20 sat's 93%.

## 2021-07-15 NOTE — Plan of Care (Signed)

## 2021-07-16 LAB — CULTURE, BLOOD (ROUTINE X 2)
Culture: NO GROWTH
Culture: NO GROWTH

## 2021-07-17 LAB — BODY FLUID CULTURE W GRAM STAIN: Culture: NO GROWTH

## 2021-08-09 DEATH — deceased

## 2021-08-13 LAB — FUNGUS CULTURE WITH STAIN

## 2021-08-13 LAB — FUNGAL ORGANISM REFLEX

## 2021-08-13 LAB — FUNGUS CULTURE RESULT

## 2021-09-12 IMAGING — US US BIOPSY FNA W/ IMAGING
1 series · 15 of 16 positions shown · non-contrast
Comparison: none

INDICATION: Imaging findings concerning for head and neck malignancy with
metastatic nodes.

[Series 1: us biopsy · 15 of 16 slices shown]
[im 1/16]
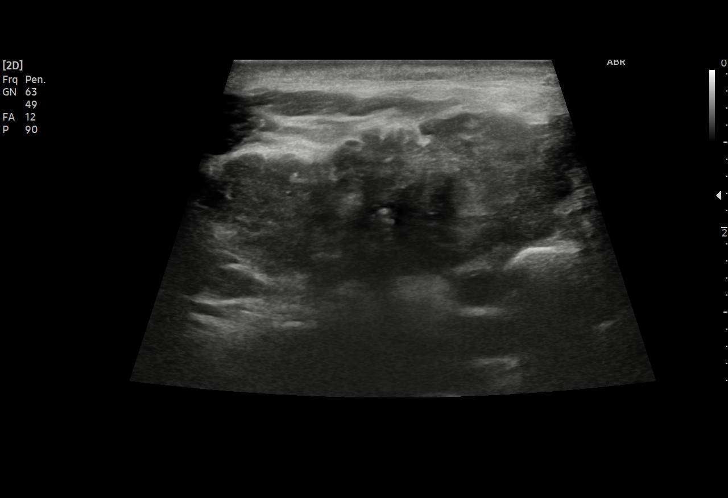
[im 2/16]
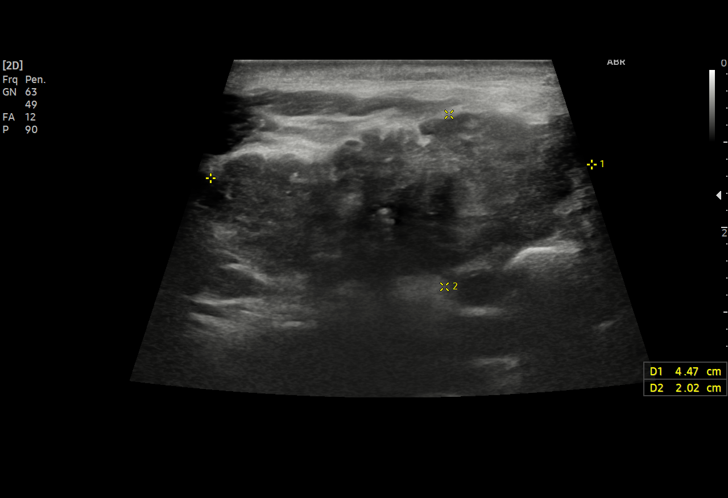
[im 3/16]
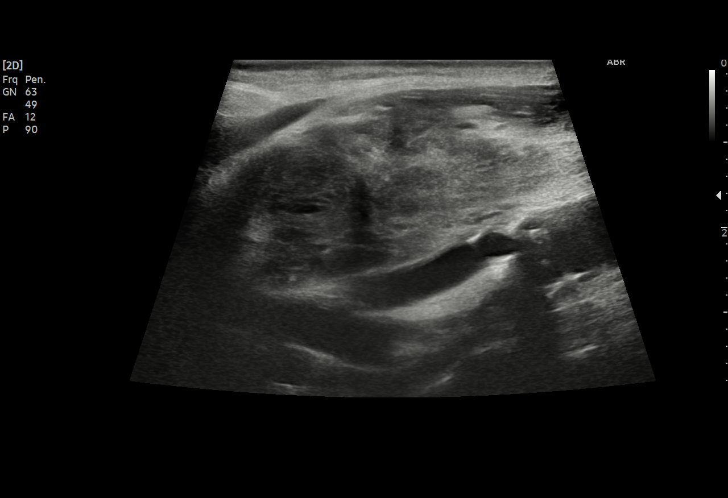
[im 4/16]
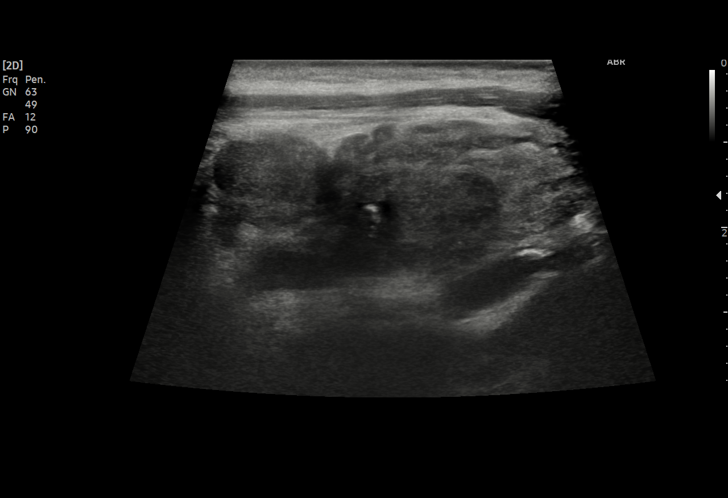
[im 5/16]
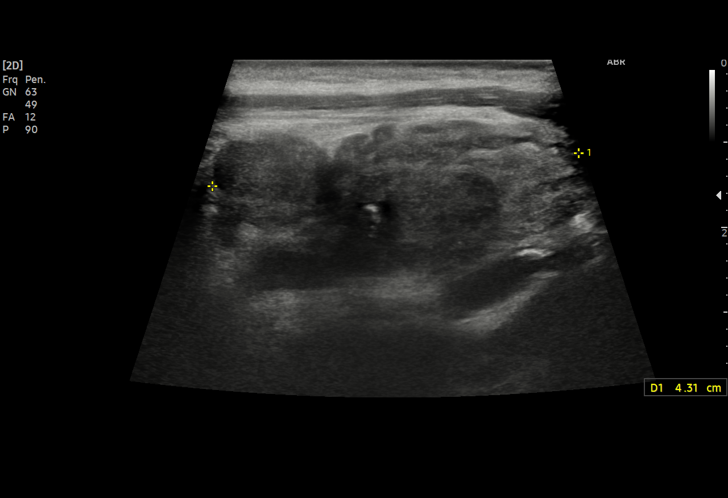
[im 6/16]
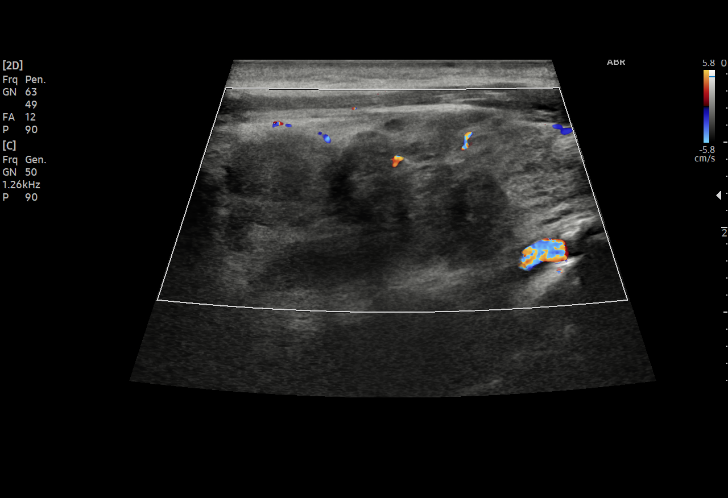
[im 7/16]
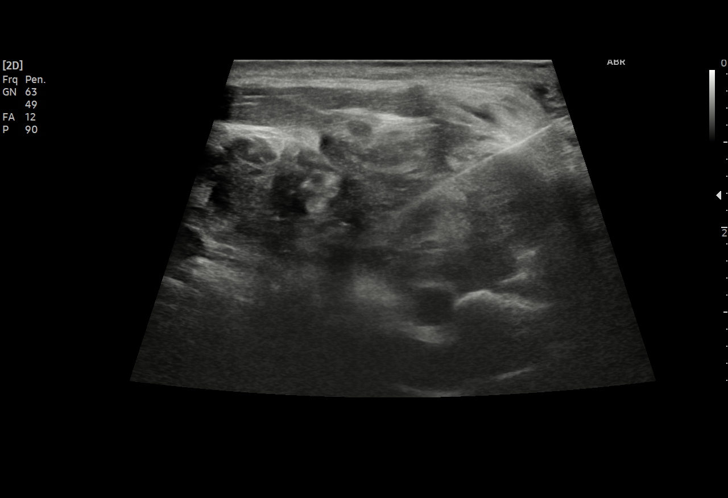
[im 9/16]
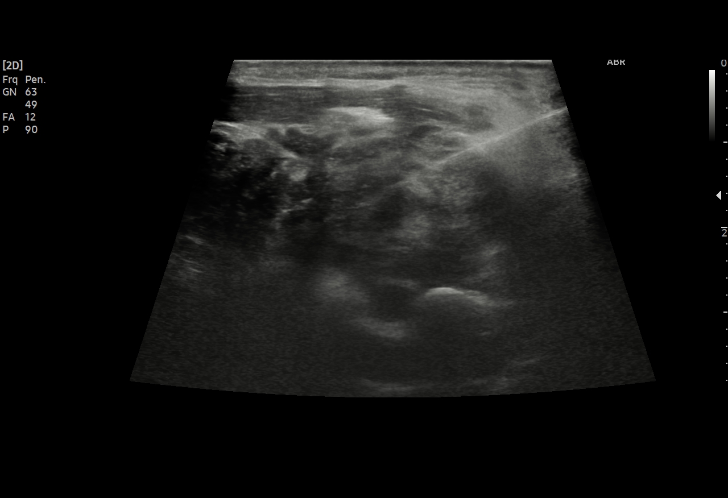
[im 10/16]
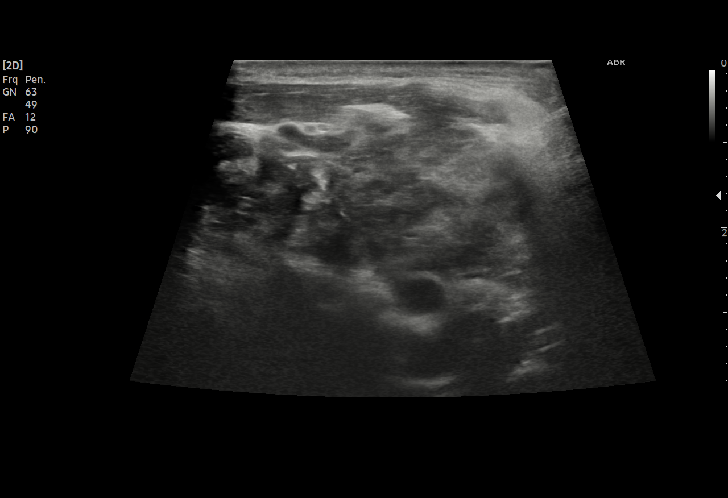
[im 11/16]
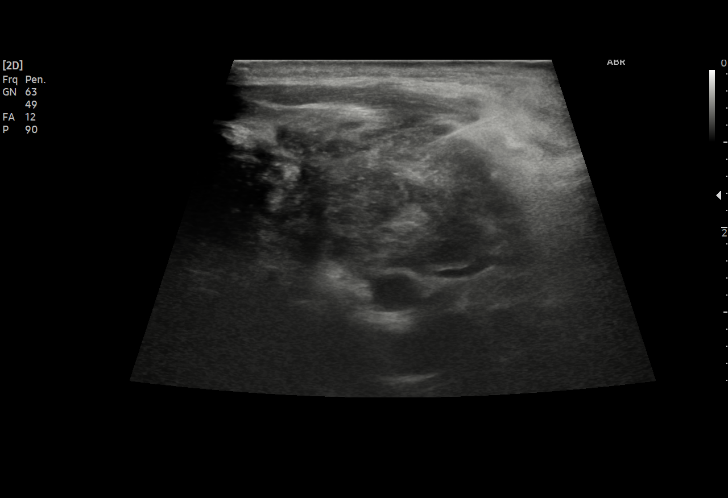
[im 12/16]
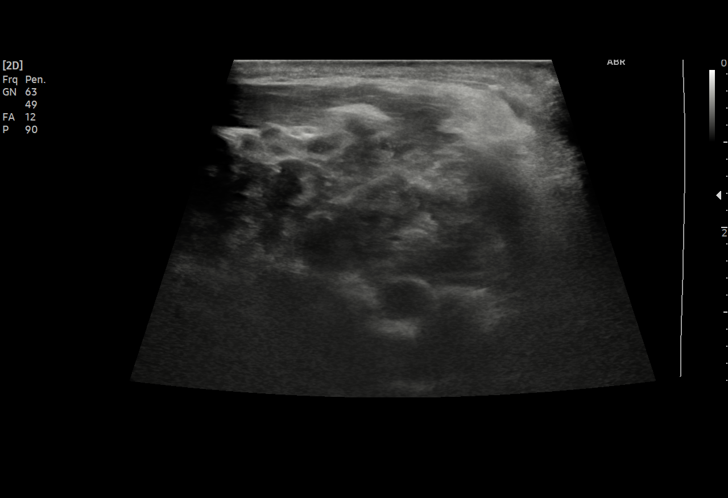
[im 13/16]
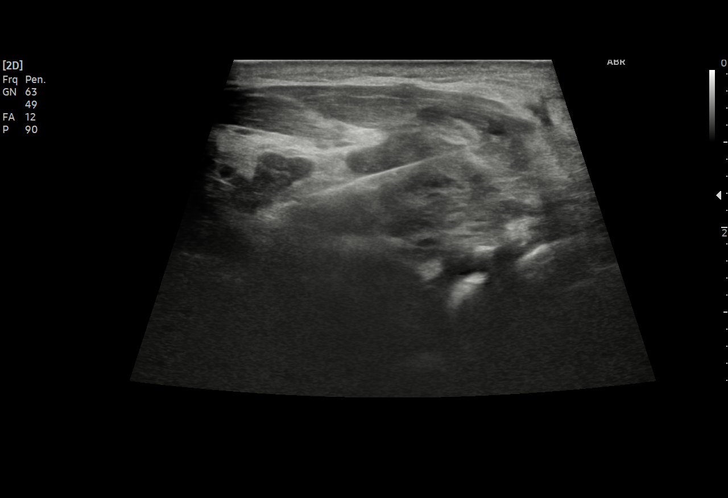
[im 14/16]
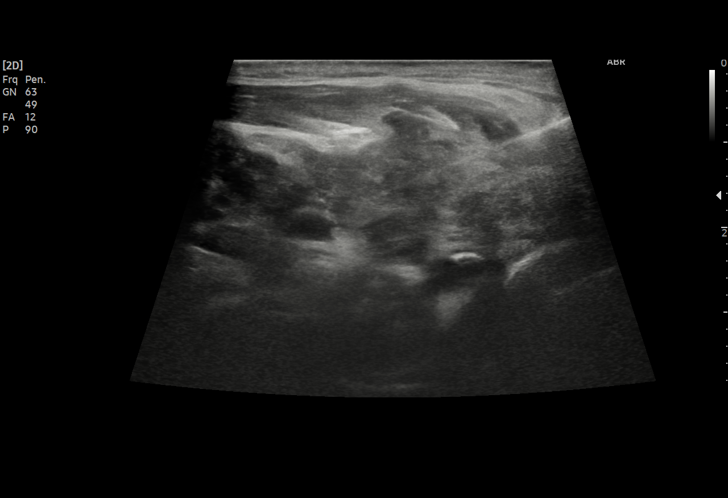
[im 15/16]
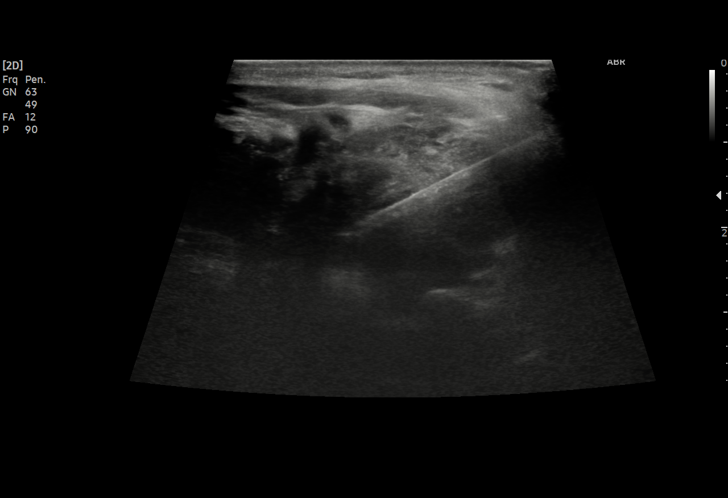
[im 16/16]
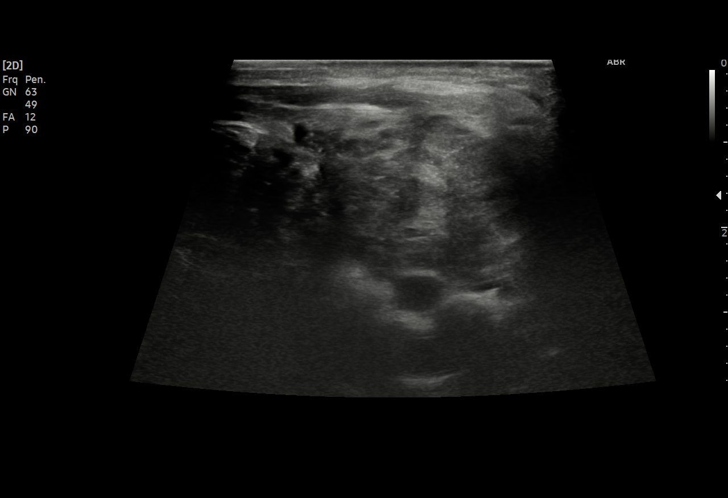

[15 of 16 positions shown; findings below may reference images not displayed]

EXAM:
ULTRASOUND FNA RIGHT NECK NODAL MASS

ULTRASOUND CORE BIOPSY RIGHT NECK NODAL MASS

MEDICATIONS:
1% lidocaine local

ANESTHESIA/SEDATION:
Moderate Sedation Time: None. The patient's level of consciousness
and vital signs were monitored continuously by radiology nursing
throughout the procedure under my direct supervision.

FLUOROSCOPY TIME:  Fluoroscopy Time: None.

COMPLICATIONS:
None immediate.

PROCEDURE:
Informed written consent was obtained from the patient after a
thorough discussion of the procedural risks, benefits and
alternatives. All questions were addressed. Maximal Sterile Barrier
Technique was utilized including caps, mask, sterile gowns, sterile
gloves, sterile drape, hand hygiene and skin antiseptic. A timeout
was performed prior to the initiation of the procedure.

Previous imaging reviewed. Preliminary ultrasound performed. The
right neck submandibular nodal mass was localized and marked. This
was correlated with the CT from 07/29/2020.

Ultrasound fine-needle aspiration: Under sterile conditions and
local anesthesia, 25 gauge needles were utilized performed
fine-needle aspiration of the lesion. This was evaluated by
cytology. Some of the samples were scant in cellularity. Suspicious
cells for malignancy were identified.

Ultrasound core biopsy: Also under sterile conditions and local
anesthesia, an 18 gauge core biopsy needle was advanced to the right
neck nodal mass. 4 18 gauge core biopsies obtained. These were
placed on a saline moistened Telfa pad. Postprocedure imaging
demonstrates no hemorrhage or hematoma.

Patient tolerated biopsy well.
IMPRESSION: Successful right neck nodal mass FNA and core biopsies as above.

## 2021-10-14 ENCOUNTER — Telehealth: Payer: Medicare HMO

## 2022-06-24 IMAGING — CR DG CHEST 2V
1 series · 2 of 2 positions shown · non-contrast
Comparison: PET-CT 08/13/2020

CLINICAL DATA: Metastatic squamous cell carcinoma of the head neck
post tracheostomy. Worsening cough, sputum and secretions,
aspiration pneumonia, history COPD/emphysema, acute exacerbation of
emphysema

EXAM:
CHEST - 2 VIEW

[Series 1: dg chest 2 view · 0.14mm/px · 2 of 2 slices shown]
[im 1/2]
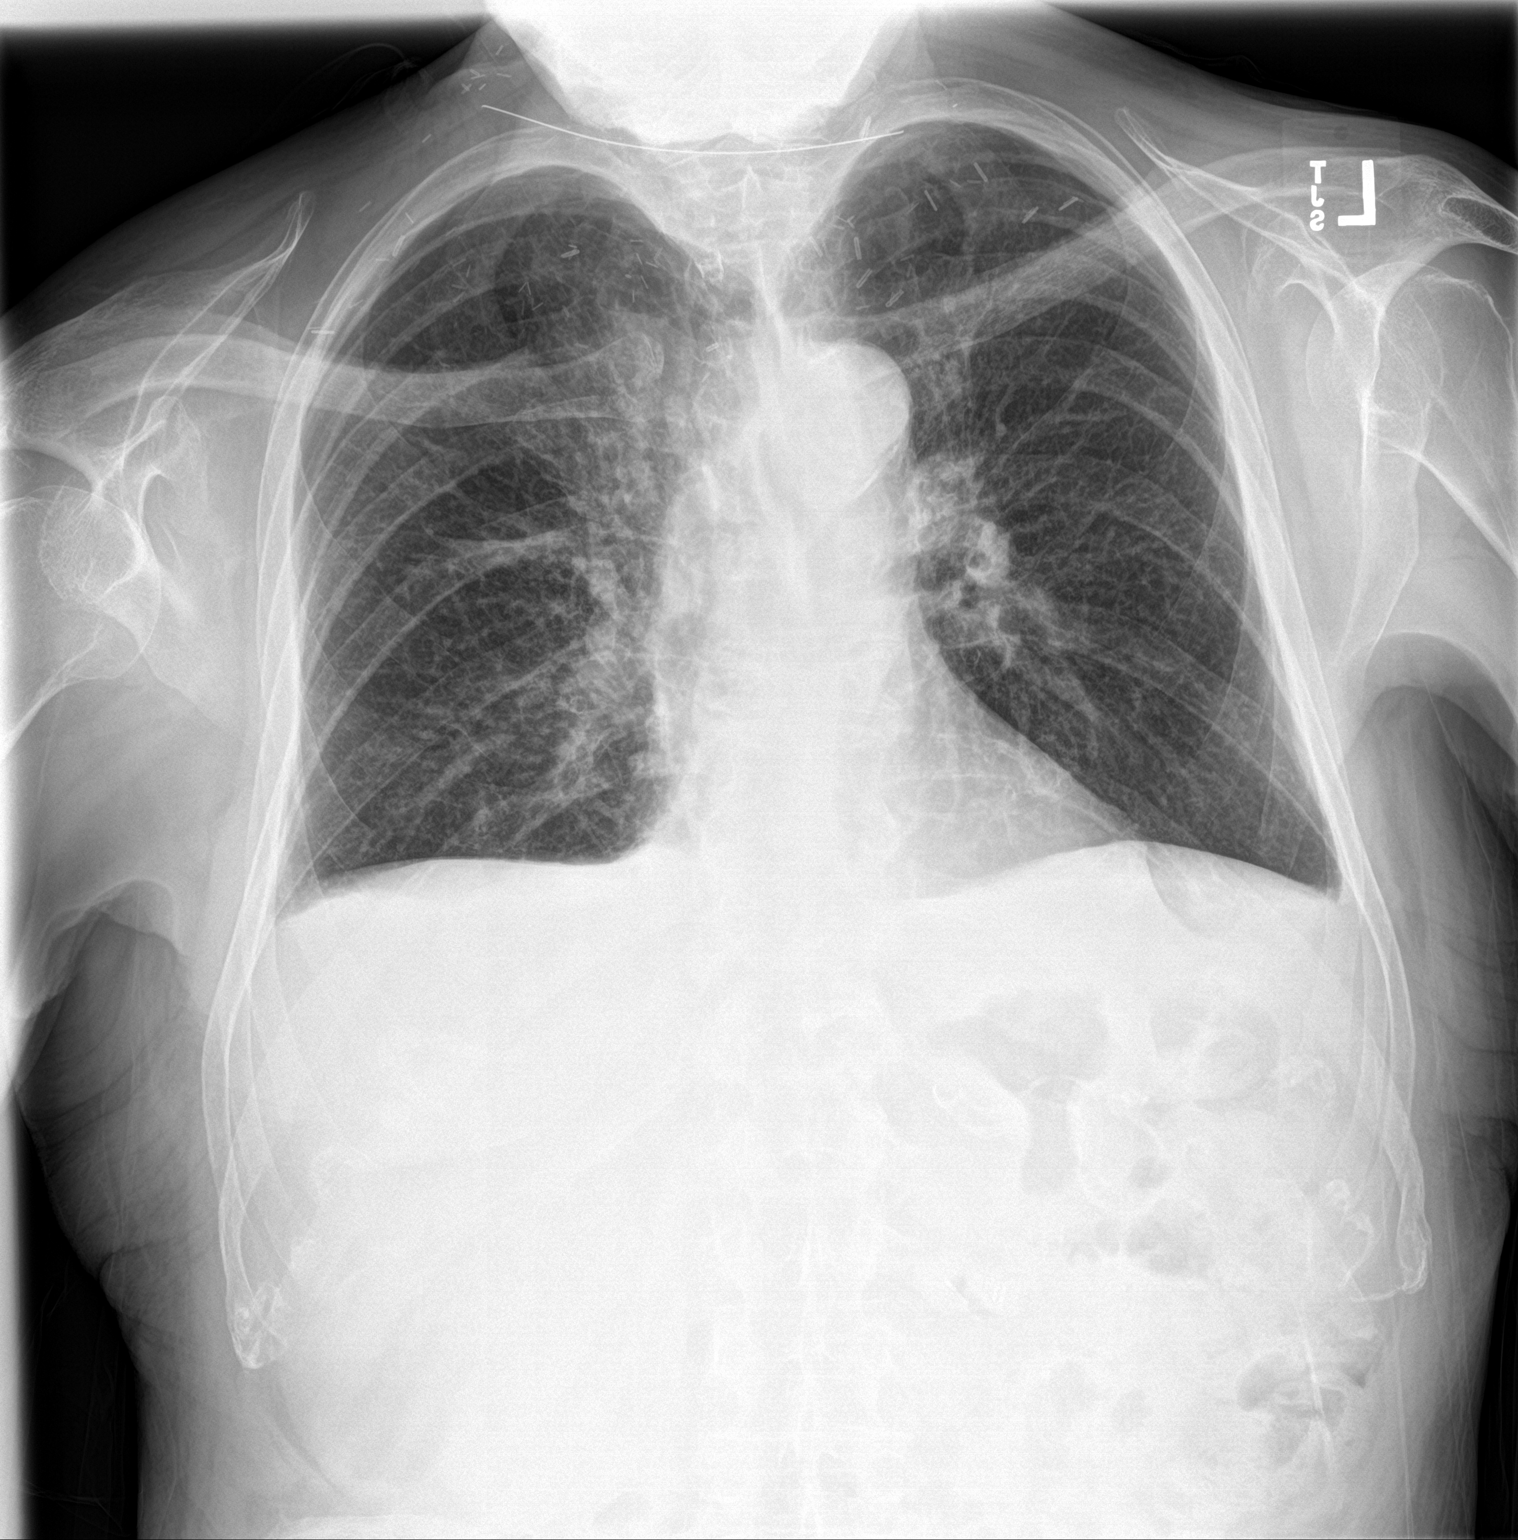
[im 2/2]
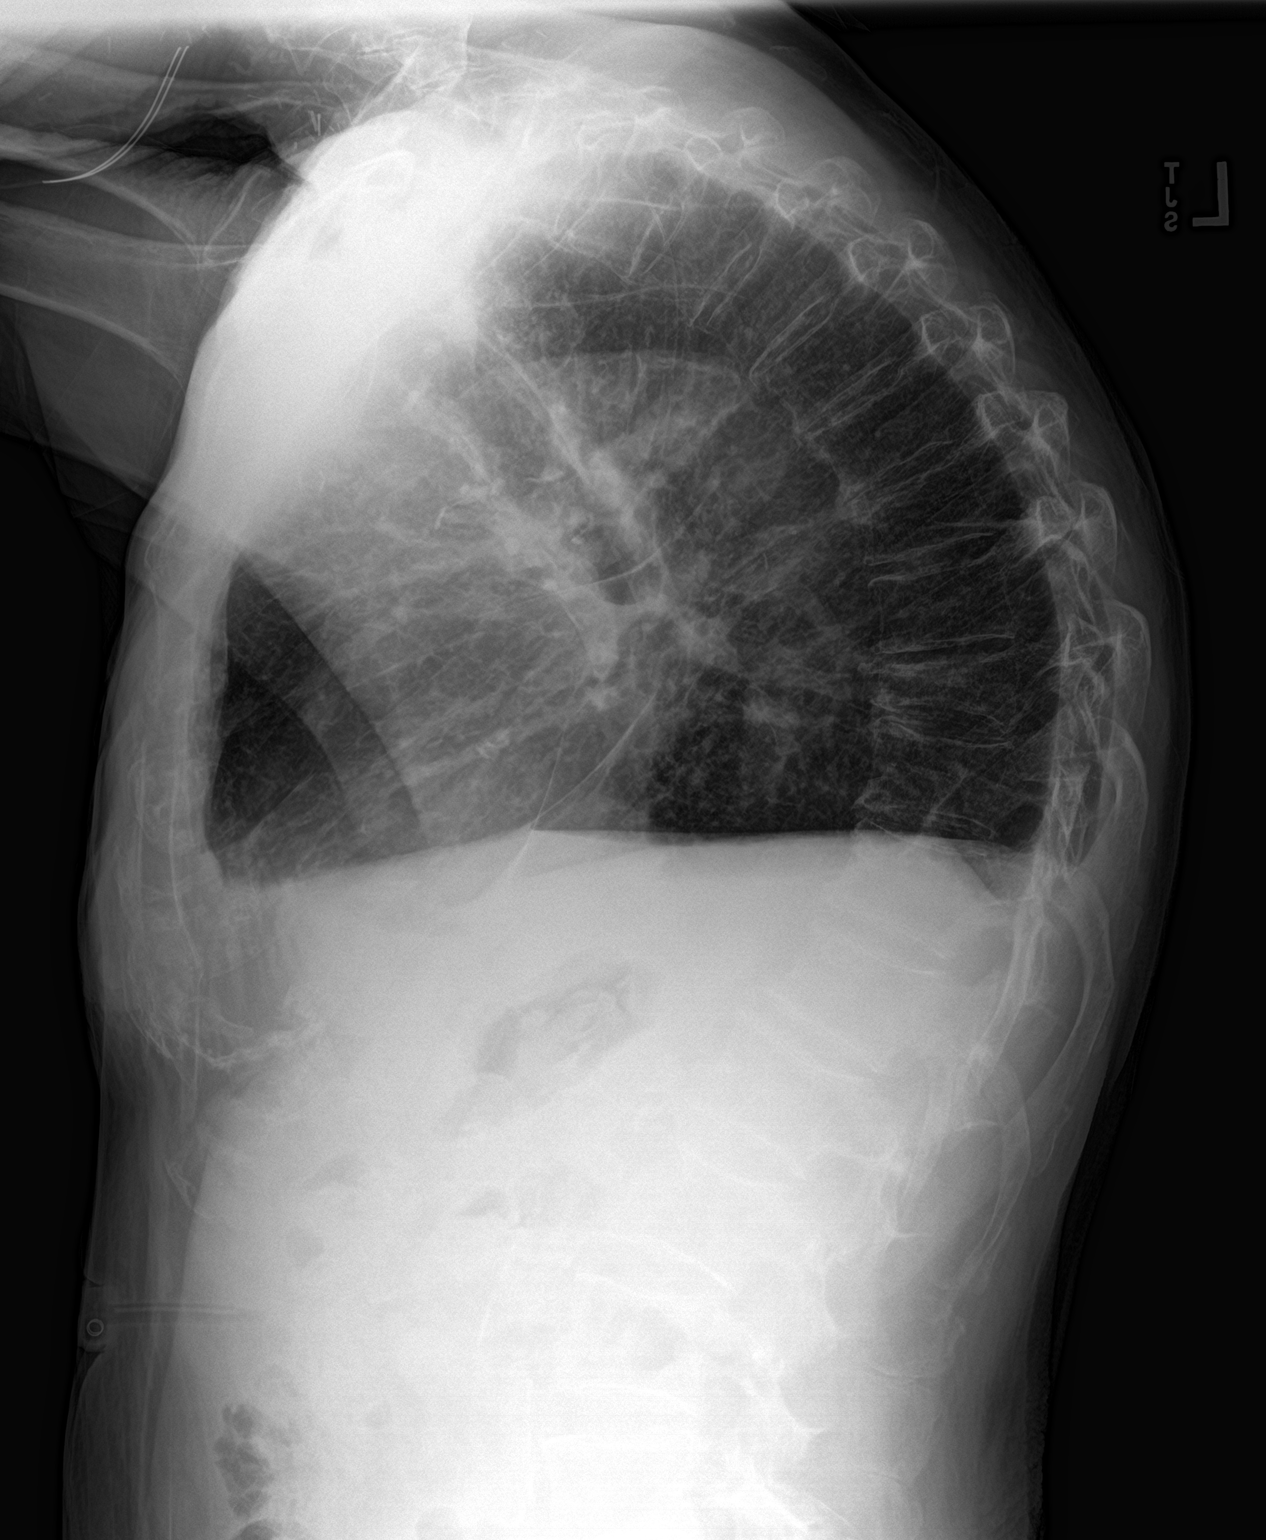

[2 of 2 positions shown; findings below may reference images not displayed]

FINDINGS: Normal heart size, mediastinal contours, and pulmonary vascularity.

Atherosclerotic calcification aorta.

Emphysematous and bronchitic changes consistent with COPD.

Increased markings RIGHT suprahilar, could represent acute
infiltrate or scarring.

Remaining lungs clear.

No pleural effusion or pneumothorax.

Marked osseous demineralization with compression fracture of a lower
thoracic vertebra approximately T9.
IMPRESSION: COPD changes with increased RIGHT suprahilar markings question
infiltrate versus scarring, tumor felt less likely but not excluded;
consider follow-up CT imaging for further assessment.

Compression fracture T9 vertebral body with anterior height loss new
since 03/20/2020.

## 2022-08-10 IMAGING — CR DG ABDOMEN 1V
1 series · 1 of 1 positions shown · non-contrast
Comparison: None.

CLINICAL DATA: Gastrostomy tube check with PO contrast

EXAM:
ABDOMEN - 1 VIEW

[dg abd 1 view]
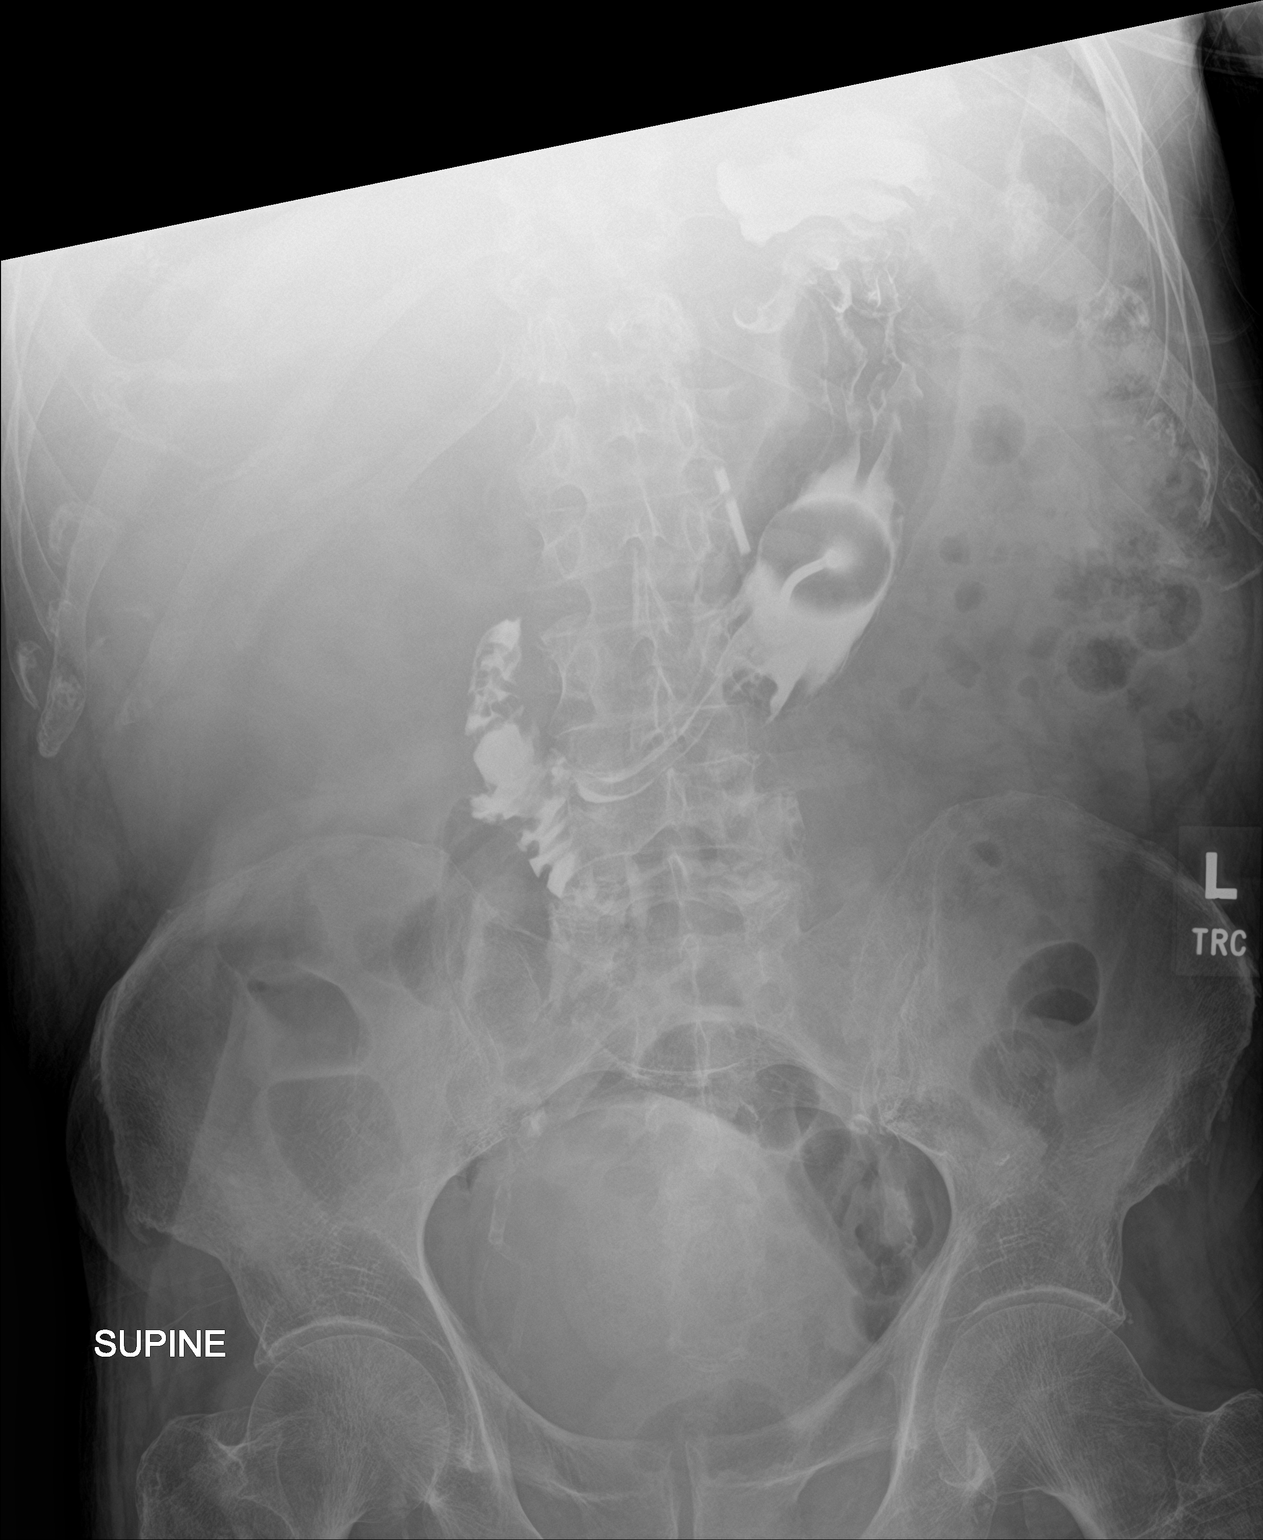

[1 of 1 positions shown; findings below may reference images not displayed]

FINDINGS: PO contrast administered via gastrostomy tube located within the
left upper abdomen. Partial opacification of the gastric lumen and
duodenal lumen. No findings to suggest extravasation of PO contrast.
The bowel gas pattern is normal. No radio-opaque calculi or other
significant radiographic abnormality are seen. Vascular
calcification.
IMPRESSION: Gastrostomy tube in appropriate position.

## 2022-08-18 IMAGING — US US THORACENTESIS ASP PLEURAL SPACE W/IMG GUIDE
1 series · 4 of 4 positions shown · non-contrast
Comparison: none

INDICATION: Bilateral pleural effusions with right greater than left request
received for diagnostic and therapeutic thoracentesis.

[Series 1: us thoracentesis asp pleural s · 4 of 4 slices shown]
[im 1/4]
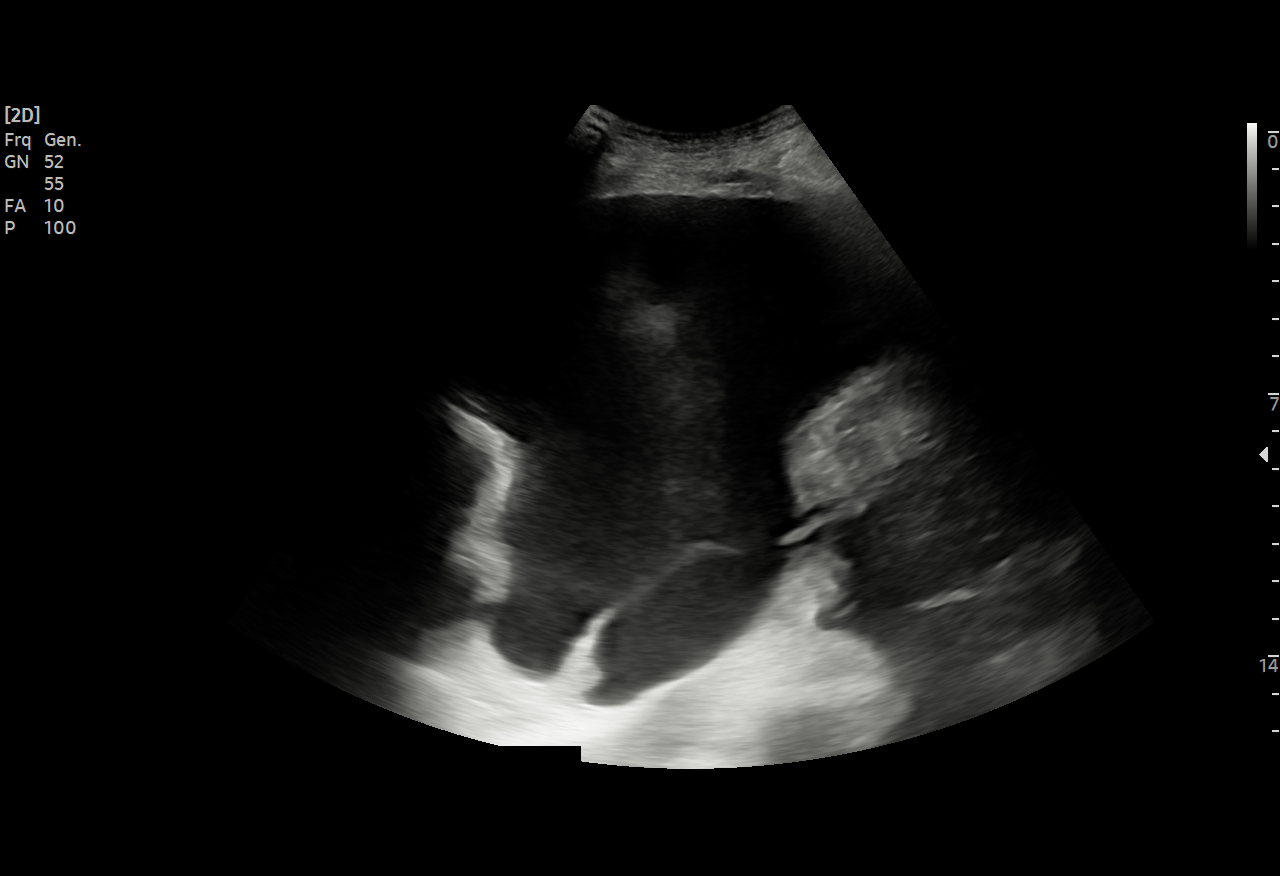
[im 2/4]
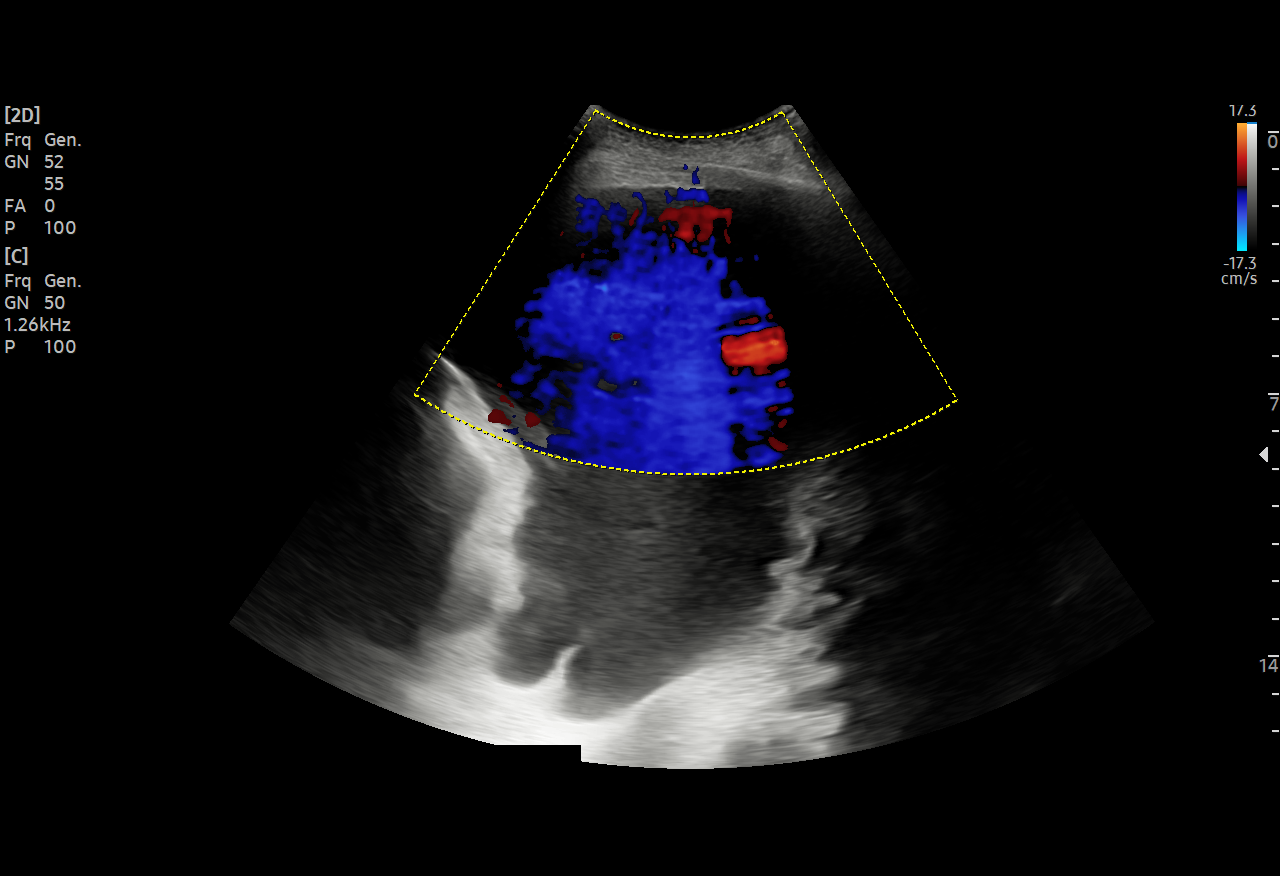
[im 3/4]
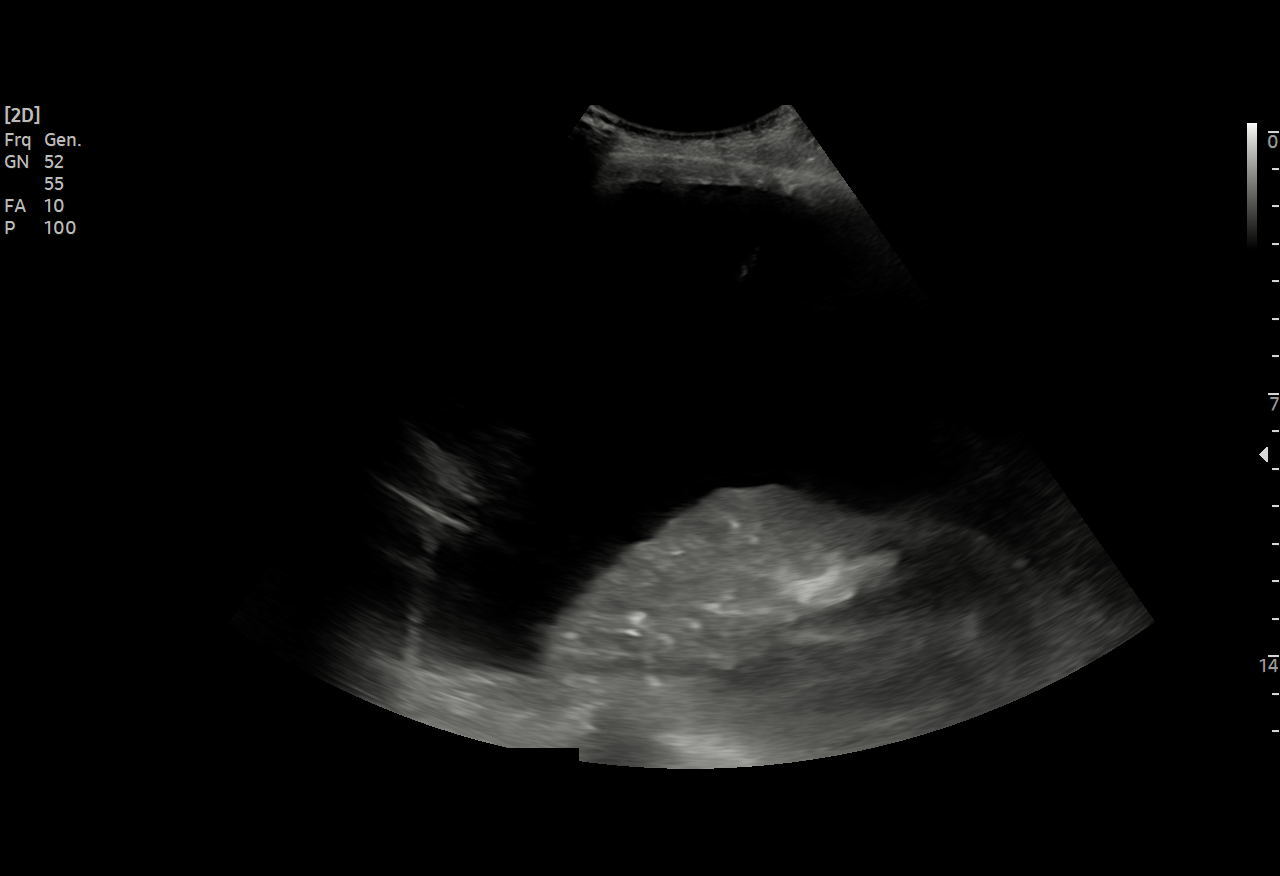
[im 4/4]
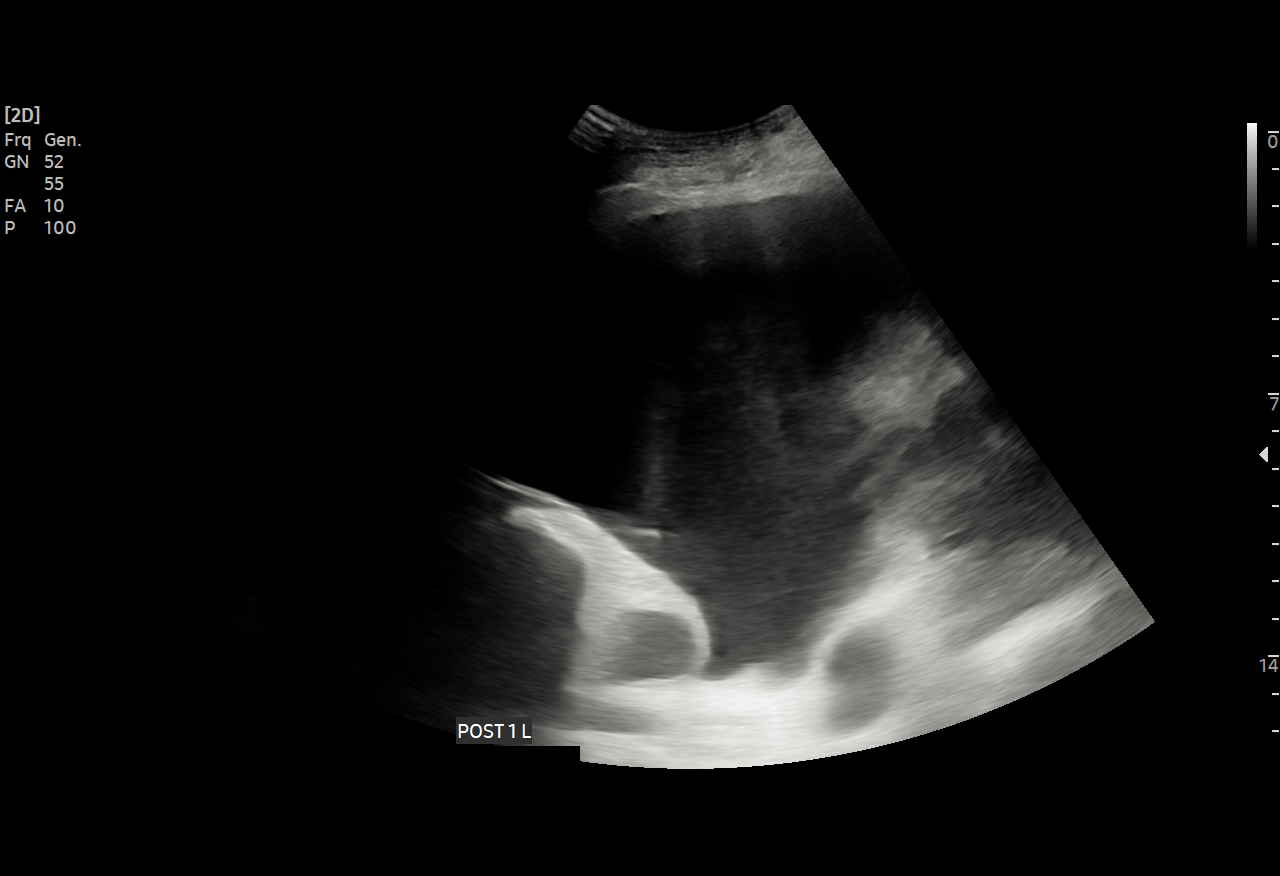

[4 of 4 positions shown; findings below may reference images not displayed]

EXAM:
ULTRASOUND GUIDED RIGHT THORACENTESIS, DIAGNOSTIC and THERAPEUTIC

MEDICATIONS:
Local 1% lidocaine only.

COMPLICATIONS:
None immediate.

PROCEDURE:
An ultrasound guided thoracentesis was thoroughly discussed with the
patient and questions answered. The benefits, risks, alternatives
and complications were also discussed. The patient understands and
wishes to proceed with the procedure. Written consent was obtained.

Ultrasound was performed to localize and mark an adequate pocket of
fluid in the right chest. The area was then prepped and draped in
the normal sterile fashion. 1% Lidocaine was used for local
anesthesia. Under ultrasound guidance a 19 gauge, 7-cm, Yueh
catheter was introduced. Thoracentesis was performed. The catheter
was removed and a dressing applied.
FINDINGS: A total of approximately 1 L of clear yellow fluid was removed.
Samples were sent to the laboratory as requested by the clinical
team.
IMPRESSION: Successful ultrasound guided diagnostic and therapeutic RIGHT
thoracentesis, yielding 1 L of pleural fluid.

## 2022-08-18 IMAGING — DX DG CHEST 1V PORT
1 series · 1 of 1 positions shown · non-contrast
Comparison: 07/11/2021 radiograph and chest CT

CLINICAL DATA: Post thoracentesis

EXAM:
PORTABLE CHEST 1 VIEW

[chest ap]
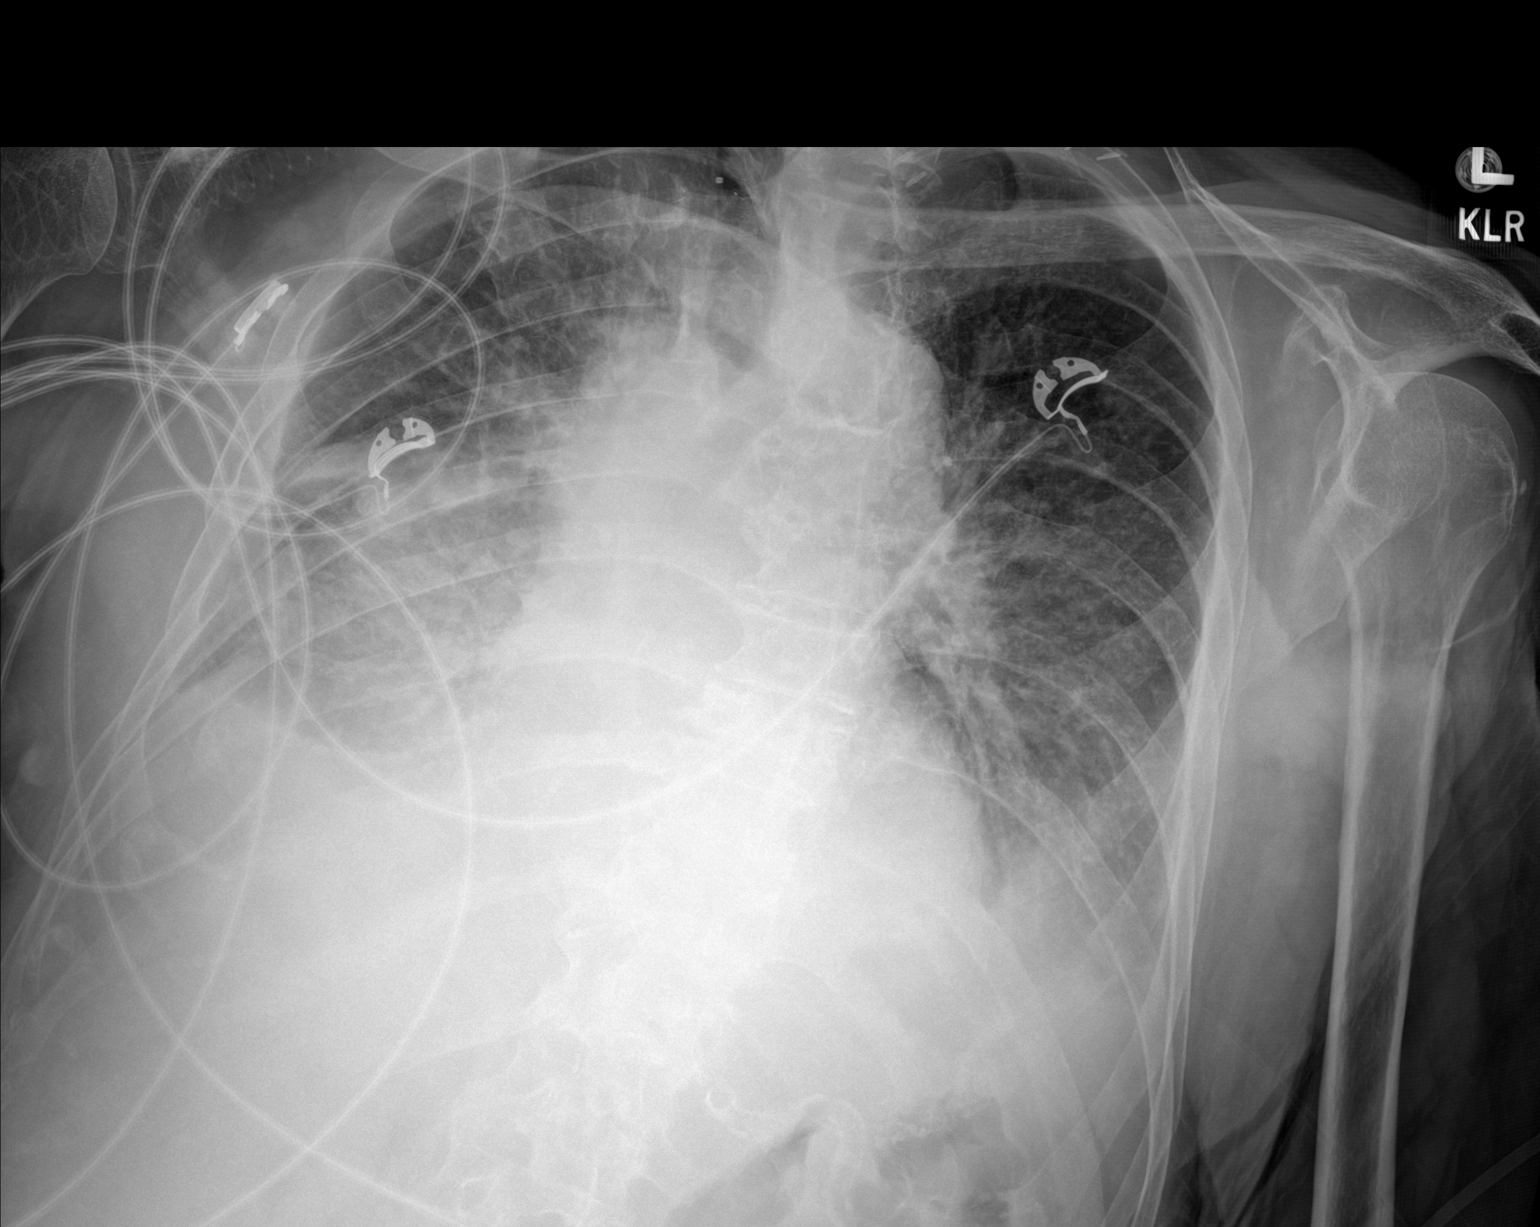

[1 of 1 positions shown; findings below may reference images not displayed]

FINDINGS: Decreased right-sided pleural effusion post thoracentesis. No
definitive pneumothorax is seen. Residual moderate bilateral
effusions stable cardiomediastinal silhouette with vascular
congestion and probable pulmonary edema. Airspace disease in the
lower lungs with consolidation, atelectasis versus pneumonia.
Masslike fullness in the right hilar region.
IMPRESSION: 1. Slight decreased right pleural effusion post thoracentesis. No
pneumothorax. There are moderate residual bilateral effusions
2. Stable cardiomediastinal silhouette with vascular congestion and
pulmonary edema. Slight masslike enlargement of right hilar region,
attention on follow-up imaging.
3. Lower lung airspace disease could reflect atelectasis or
pneumonia
